# Patient Record
Sex: Female | Born: 1975
Health system: Southern US, Community
[De-identification: ages and names within clinical notes are randomized; demographics above are authoritative.]

## PROBLEM LIST (undated history)

## (undated) DIAGNOSIS — M8580 Other specified disorders of bone density and structure, unspecified site: Secondary | ICD-10-CM

## (undated) DIAGNOSIS — T7840XA Allergy, unspecified, initial encounter: Secondary | ICD-10-CM

## (undated) DIAGNOSIS — O039 Complete or unspecified spontaneous abortion without complication: Secondary | ICD-10-CM

## (undated) DIAGNOSIS — R011 Cardiac murmur, unspecified: Secondary | ICD-10-CM

## (undated) DIAGNOSIS — N390 Urinary tract infection, site not specified: Secondary | ICD-10-CM

## (undated) DIAGNOSIS — C50919 Malignant neoplasm of unspecified site of unspecified female breast: Secondary | ICD-10-CM

## (undated) DIAGNOSIS — G43909 Migraine, unspecified, not intractable, without status migrainosus: Secondary | ICD-10-CM

## (undated) DIAGNOSIS — E785 Hyperlipidemia, unspecified: Secondary | ICD-10-CM

## (undated) DIAGNOSIS — G709 Myoneural disorder, unspecified: Secondary | ICD-10-CM

## (undated) DIAGNOSIS — F419 Anxiety disorder, unspecified: Secondary | ICD-10-CM

## (undated) DIAGNOSIS — M2550 Pain in unspecified joint: Secondary | ICD-10-CM

## (undated) DIAGNOSIS — Z8614 Personal history of Methicillin resistant Staphylococcus aureus infection: Secondary | ICD-10-CM

## (undated) HISTORY — DX: Allergy, unspecified, initial encounter: T78.40XA

## (undated) HISTORY — DX: Hyperlipidemia, unspecified: E78.5

## (undated) HISTORY — DX: Migraine, unspecified, not intractable, without status migrainosus: G43.909

## (undated) HISTORY — PX: DILATION AND CURETTAGE OF UTERUS: SHX78

## (undated) HISTORY — PX: COLONOSCOPY: SHX174

## (undated) HISTORY — DX: Complete or unspecified spontaneous abortion without complication: O03.9

## (undated) HISTORY — DX: Cardiac murmur, unspecified: R01.1

## (undated) HISTORY — PX: WISDOM TOOTH EXTRACTION: SHX21

## (undated) HISTORY — DX: Pain in unspecified joint: M25.50

## (undated) HISTORY — DX: Urinary tract infection, site not specified: N39.0

## (undated) HISTORY — DX: Myoneural disorder, unspecified: G70.9

## (undated) HISTORY — PX: VULVAR LESION REMOVAL: SHX5391

## (undated) HISTORY — DX: Other specified disorders of bone density and structure, unspecified site: M85.80

---
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
## 1998-10-29 ENCOUNTER — Other Ambulatory Visit: Admission: RE | Admit: 1998-10-29 | Discharge: 1998-10-29 | Payer: Self-pay | Admitting: Family Medicine

## 2001-06-21 ENCOUNTER — Other Ambulatory Visit: Admission: RE | Admit: 2001-06-21 | Discharge: 2001-06-21 | Payer: Self-pay | Admitting: Family Medicine

## 2009-04-15 ENCOUNTER — Inpatient Hospital Stay (HOSPITAL_COMMUNITY): Admission: AD | Admit: 2009-04-15 | Discharge: 2009-04-15 | Payer: Self-pay | Admitting: Obstetrics and Gynecology

## 2009-07-06 ENCOUNTER — Inpatient Hospital Stay (HOSPITAL_COMMUNITY): Admission: AD | Admit: 2009-07-06 | Discharge: 2009-07-06 | Payer: Self-pay | Admitting: Obstetrics and Gynecology

## 2009-07-18 ENCOUNTER — Inpatient Hospital Stay (HOSPITAL_COMMUNITY): Admission: AD | Admit: 2009-07-18 | Discharge: 2009-07-21 | Payer: Self-pay | Admitting: Obstetrics and Gynecology

## 2009-12-15 ENCOUNTER — Encounter: Payer: Self-pay | Admitting: Internal Medicine

## 2010-01-16 ENCOUNTER — Ambulatory Visit: Payer: Self-pay | Admitting: Internal Medicine

## 2010-01-16 DIAGNOSIS — E7849 Other hyperlipidemia: Secondary | ICD-10-CM

## 2010-10-28 ENCOUNTER — Encounter
Admission: RE | Admit: 2010-10-28 | Discharge: 2010-10-28 | Payer: Self-pay | Source: Home / Self Care | Attending: Family Medicine | Admitting: Family Medicine

## 2010-11-26 NOTE — Assessment & Plan Note (Signed)
Summary: np6/jss   Visit Type:  Follow-up Primary Provider:  DR Rudi Heap  CC:  high cholestrol.  History of Present Illness: Patient is a 35 year old with no cardiac history.  She is self referred for evaluation of an abnormall [facility] heart lab panel.  Per the patient she has had mild elevation in lipids in the past.   both parents had incrased cholesterol; sister's lipids are good. On talking to her about her diet she  is watching what she eats somewhat. trying to stay low fat.  But, diffiult at times with busy schedule and young kids.  She exercises 3x per week at moderate activity. No chest pains.  Breathing is OK.  Current Medications (verified): 1)  Ortho Tri .... Birth Control 2)  Meds For Acne/ .... Tropical Creams  Allergies (verified): 1)  ! Sulfa 2)  ! Pcn  Past History:  Past Medical History: Dyslipidemia Rosacea  Past Surgical History: Pectus exacavatum repari 1996 C section x 2  Family History: Mother and father with dyslipidemia [place] side of family with premature CAD  Social History: Married.  2 kids No tobacco  Rare EtOH.  Review of Systems       All systems reviewed.  Negative to the above problem except as noted above.  Vital Signs:  Patient profile:   35 year old female Height:      60 inches Weight:      115 pounds BMI:     22.54 Pulse rate:   70 / minute BP sitting:   106 / 67  (left arm) Cuff size:   regular  Vitals Entered By: Burnett Kanaris, CNA (January 16, 2010 2:01 PM)  Physical Exam  Additional Exam:  Patient is in NAD. HEENT:  Normocephalic, atraumatic. EOMI, PERRLA.  Neck: JVP is normal. No thyromegaly. No bruits.  Lungs: clear to auscultation. No rales no wheezes.  Heart: Regular rate and rhythm. Normal S1, S2. No S3.   No significant murmurs. PMI not displaced.  Abdomen:  Supple, nontender. Normal bowel sounds. No masses. No hepatomegaly.  Extremities:   Good distal pulses throughout. No lower extremity edema.    Musculoskeletal :moving all extremities.  Neuro:   alert and oriented x3.    EKG  Procedure date:  01/16/2010  Findings:      NSR.  70 bpm.  Impression & Recommendations:  Problem # 1:  HYPERLIPIDEMIA-MIXED (ICD-272.4) Review of the patient's recent [facility] panel show that her lipids aren't optimal.  LDL is increased with smaller particls.  HDL hs saller particles.  Patients genotype is noted to not be optimal.  CRP is also mildly elevated. I have revewed this with Corrie Dandy.  I have tried to reassure her.  As a 35 year old woman she is at very low risk for developing vascular dz at present.  I think she has ample time to make changes an follow. We discussed diet.  She has room to make changes.  I would recomm fish oil as tolerated. We also discussed increasing aerobic activities. I do not think that she needs to worry about starting any medical Rx now.  I would recomm f/u with lipomed later this year after she has made some changes. I think it would be interesting to get a [facility] panel for both parents.

## 2011-01-29 LAB — CBC
Hemoglobin: 11.3 g/dL — ABNORMAL LOW (ref 12.0–15.0)
MCHC: 34.3 g/dL (ref 30.0–36.0)
MCHC: 34.4 g/dL (ref 30.0–36.0)
MCV: 98.9 fL (ref 78.0–100.0)
Platelets: 137 10*3/uL — ABNORMAL LOW (ref 150–400)
Platelets: 195 10*3/uL (ref 150–400)
RBC: 3.32 MIL/uL — ABNORMAL LOW (ref 3.87–5.11)
RDW: 13.4 % (ref 11.5–15.5)
WBC: 10.1 10*3/uL (ref 4.0–10.5)

## 2011-01-29 LAB — RPR: RPR Ser Ql: NONREACTIVE

## 2011-07-08 LAB — RPR: RPR: NONREACTIVE

## 2011-07-08 LAB — HEPATITIS B SURFACE ANTIGEN: Hepatitis B Surface Ag: NEGATIVE

## 2011-07-08 LAB — ABO/RH

## 2011-08-26 ENCOUNTER — Ambulatory Visit (INDEPENDENT_AMBULATORY_CARE_PROVIDER_SITE_OTHER): Payer: 59 | Admitting: Internal Medicine

## 2011-08-26 ENCOUNTER — Encounter: Payer: Self-pay | Admitting: Internal Medicine

## 2011-08-26 VITALS — BP 107/71 | HR 98 | Temp 98.3°F | Ht 60.0 in | Wt 118.0 lb

## 2011-08-26 DIAGNOSIS — R300 Dysuria: Secondary | ICD-10-CM

## 2011-08-26 DIAGNOSIS — L732 Hidradenitis suppurativa: Secondary | ICD-10-CM | POA: Insufficient documentation

## 2011-08-26 LAB — URINALYSIS, ROUTINE W REFLEX MICROSCOPIC
Bilirubin Urine: NEGATIVE
Glucose, UA: NEGATIVE mg/dL
Protein, ur: NEGATIVE mg/dL

## 2011-08-26 MED ORDER — CLINDAMYCIN PHOSPHATE 1 % EX LOTN: TOPICAL_LOTION | Freq: Two times a day (BID) | CUTANEOUS | Status: DC

## 2011-08-26 NOTE — Assessment & Plan Note (Signed)
That is the first episode I do suspect that she has hidradenitis suppurativa. I doubt this is MRSA skin or abscess infection for her the reason that it is bilateral in both in both axillary regions. It is not warm or significantly erythematous and is not a confluent fluid collection. I also do not think this is folliculitis because the follicles are not specifically inflamed or there is no other lymphadenopathy noted. She has no systemic symptoms of infection. Therefore both by the presentation and the fact that is bilateral I do suspect this is as stated, the hidradenitis. At this time, I have given her a prescription for a topical therapy and has anti-inflammatory effects and have referred her to a dermatologist who can further elucidate if there is any other diagnosis or ways to prevent future events. I am avoiding systemic therapy at this time to do her pregnancy and her allergies. Additionally she has had some malaise and other nonspecific symptoms and it was suggested to her that she has a urinary tract infection and therefore I will check a UA and sent off for culture if it is positive and consider therapy based on that. I have advised her to stop taking her erythromycin pending further evaluation with urinalysis.

## 2011-08-26 NOTE — Progress Notes (Signed)
  Subjective:    Patient ID: Katelyn Shelton, female    DOB: Jul 01, 1976, 35 y.o.   MRN: [id number]  HPI The patient comes in as a new patient for bilateral axillary lesions. She tells me that she noted some increased swelling of her axillary regions on both sides approximately one week ago. They have increased in size and have been more and more painful. They are not associated with fever or chills. She has had some general malaise as well recently which her oxygenation attributed to a urinary tract infection. She is currently on erythromycin but has not felt any improvement. She's had no history of this in her axillary region or in her groin or in any other areas. She's had no history of seeing significant rashes. No history of anything related to this. They are significantly painful particularly with palpation.   Review of Systems  Constitutional: Negative for fever, chills, activity change and fatigue.  Gastrointestinal:       Gravid  Musculoskeletal: Negative for myalgias and arthralgias.  Skin: Positive for rash.  Neurological: Positive for headaches.  Hematological: Negative for adenopathy.       Objective:   Physical Exam  Constitutional: She appears well-developed and well-nourished.  Skin:       + multiple soft, fluctuant and mobile areas.  Both axillary regions.    Psychiatric: She has a normal mood and affect. Her behavior is normal.          Assessment & Plan:

## 2011-08-26 NOTE — Patient Instructions (Signed)
Follow up with dermatology for ? Hidradenitis supperativa.

## 2011-08-27 LAB — URINALYSIS, MICROSCOPIC ONLY
Bacteria, UA: NONE SEEN
Casts: NONE SEEN
Crystals: NONE SEEN

## 2011-08-28 LAB — URINE CULTURE: Colony Count: 3000

## 2011-08-30 NOTE — Progress Notes (Signed)
Patient notified

## 2012-01-21 ENCOUNTER — Encounter (HOSPITAL_COMMUNITY): Payer: Self-pay

## 2012-02-01 ENCOUNTER — Encounter (HOSPITAL_COMMUNITY): Payer: Self-pay

## 2012-02-02 ENCOUNTER — Encounter (HOSPITAL_COMMUNITY)
Admission: RE | Admit: 2012-02-02 | Discharge: 2012-02-02 | Disposition: A | Discharge status: Home / Self Care | Payer: 59 | Source: Ambulatory Visit | Attending: Obstetrics and Gynecology | Admitting: Obstetrics and Gynecology

## 2012-02-02 ENCOUNTER — Encounter (HOSPITAL_COMMUNITY): Payer: Self-pay

## 2012-02-02 LAB — CBC
MCH: 32.4 pg (ref 26.0–34.0)
MCHC: 33.6 g/dL (ref 30.0–36.0)
MCV: 96.6 fL (ref 78.0–100.0)
Platelets: 187 10*3/uL (ref 150–400)
RDW: 13.9 % (ref 11.5–15.5)
WBC: 8.5 10*3/uL (ref 4.0–10.5)

## 2012-02-02 LAB — SURGICAL PCR SCREEN: MRSA, PCR: NEGATIVE

## 2012-02-02 NOTE — Patient Instructions (Addendum)
20 Katelyn Shelton  02/02/2012   Your procedure is scheduled on:  02/08/12  Enter through the Main Entrance of [facility] at 1130 AM.  Pick up the phone at the desk and dial [phone].   Call this number if you have problems the morning of surgery: [phone]   Remember:   Do not eat food:after midnight the evening before  Do not drink clear liquids: 4 Hours before arrival.  Take these medicines the morning of surgery with A SIP OF WATER: NA   Do not wear jewelry, make-up or nail polish.  Do not wear lotions, powders, or perfumes. You may wear deodorant.  Do not shave 48 hours prior to surgery.  Do not bring valuables to the hospital.  Contacts, dentures or bridgework may not be worn into surgery.  Leave suitcase in the car. After surgery it may be brought to your room.  For patients admitted to the hospital, checkout time is 11:00 AM the day of discharge.   Patients discharged the day of surgery will not be allowed to drive home.  Name and phone number of your driver: NA  Special Instructions: CHG Shower Use Special Wash: 1/2 bottle night before surgery and 1/2 bottle morning of surgery.   Please read over the following fact sheets that you were given: MRSA Information

## 2012-02-04 ENCOUNTER — Encounter (HOSPITAL_COMMUNITY): Payer: Self-pay | Admitting: *Deleted

## 2012-02-04 ENCOUNTER — Encounter (HOSPITAL_COMMUNITY)
Admission: AD | Disposition: A | Discharge status: Home / Self Care | Payer: Self-pay | Source: Ambulatory Visit | Attending: Obstetrics and Gynecology

## 2012-02-04 ENCOUNTER — Inpatient Hospital Stay (HOSPITAL_COMMUNITY): Payer: 59 | Admitting: Anesthesiology

## 2012-02-04 ENCOUNTER — Encounter (HOSPITAL_COMMUNITY): Payer: Self-pay | Admitting: Anesthesiology

## 2012-02-04 ENCOUNTER — Inpatient Hospital Stay (HOSPITAL_COMMUNITY)
Admission: AD | Admit: 2012-02-04 | Discharge: 2012-02-07 | DRG: 766 | Disposition: A | Discharge status: Home / Self Care | Payer: 59 | Source: Ambulatory Visit | Attending: Obstetrics and Gynecology | Admitting: Obstetrics and Gynecology

## 2012-02-04 DIAGNOSIS — Z01812 Encounter for preprocedural laboratory examination: Secondary | ICD-10-CM

## 2012-02-04 DIAGNOSIS — L732 Hidradenitis suppurativa: Secondary | ICD-10-CM

## 2012-02-04 DIAGNOSIS — Z01818 Encounter for other preprocedural examination: Secondary | ICD-10-CM

## 2012-02-04 DIAGNOSIS — E785 Hyperlipidemia, unspecified: Secondary | ICD-10-CM

## 2012-02-04 DIAGNOSIS — O34219 Maternal care for unspecified type scar from previous cesarean delivery: Principal | ICD-10-CM | POA: Diagnosis present

## 2012-02-04 LAB — CBC
Hemoglobin: 12.9 g/dL (ref 12.0–15.0)
MCHC: 33.6 g/dL (ref 30.0–36.0)
Platelets: 196 10*3/uL (ref 150–400)
RDW: 13.8 % (ref 11.5–15.5)

## 2012-02-04 LAB — RPR: RPR Ser Ql: NONREACTIVE

## 2012-02-04 SURGERY — Surgical Case
Anesthesia: Epidural | Site: Abdomen | Wound class: Clean Contaminated

## 2012-02-04 MED ORDER — HYDROMORPHONE HCL PF 1 MG/ML IJ SOLN
INTRAMUSCULAR | Status: AC
  Filled 2012-02-04: qty 1

## 2012-02-04 MED ORDER — KETOROLAC TROMETHAMINE 30 MG/ML IJ SOLN
30.0000 mg | Freq: Four times a day (QID) | INTRAMUSCULAR | Status: AC | PRN
  Administered 2012-02-04 (×3): 30 mg via INTRAVENOUS
  Filled 2012-02-04 (×3): qty 1

## 2012-02-04 MED ORDER — BUPIVACAINE HCL (PF) 0.25 % IJ SOLN
INTRAMUSCULAR | Status: AC
  Filled 2012-02-04: qty 30

## 2012-02-04 MED ORDER — MEPERIDINE HCL 25 MG/ML IJ SOLN: 6.2500 mg | INTRAMUSCULAR | Status: DC | PRN

## 2012-02-04 MED ORDER — SCOPOLAMINE 1 MG/3DAYS TD PT72
1.0000 | MEDICATED_PATCH | Freq: Once | TRANSDERMAL | Status: AC
  Administered 2012-02-04: 1.5 mg via TRANSDERMAL

## 2012-02-04 MED ORDER — SODIUM CHLORIDE 0.9 % IJ SOLN: 3.0000 mL | INTRAMUSCULAR | Status: DC | PRN

## 2012-02-04 MED ORDER — KETOROLAC TROMETHAMINE 30 MG/ML IJ SOLN: 30.0000 mg | Freq: Four times a day (QID) | INTRAMUSCULAR | Status: AC | PRN

## 2012-02-04 MED ORDER — MENTHOL 3 MG MT LOZG: 1.0000 | LOZENGE | OROMUCOSAL | Status: DC | PRN

## 2012-02-04 MED ORDER — OXYTOCIN 20 UNITS IN LACTATED RINGERS INFUSION - SIMPLE: 125.0000 mL/h | INTRAVENOUS | Status: AC

## 2012-02-04 MED ORDER — BISACODYL 10 MG RE SUPP: 10.0000 mg | Freq: Every day | RECTAL | Status: DC | PRN

## 2012-02-04 MED ORDER — NALBUPHINE SYRINGE 5 MG/0.5 ML
INJECTION | INTRAMUSCULAR | Status: AC
  Administered 2012-02-04: 10 mg via INTRAVENOUS
  Filled 2012-02-04: qty 0.5

## 2012-02-04 MED ORDER — LACTATED RINGERS IV SOLN
INTRAVENOUS | Status: DC
  Administered 2012-02-04 (×2): via INTRAVENOUS

## 2012-02-04 MED ORDER — NALOXONE HCL 0.4 MG/ML IJ SOLN: 1.0000 ug/kg/h | INTRAMUSCULAR | Status: DC | PRN

## 2012-02-04 MED ORDER — BUPIVACAINE HCL (PF) 0.25 % IJ SOLN
INTRAMUSCULAR | Status: DC | PRN
  Administered 2012-02-04: 10 mL

## 2012-02-04 MED ORDER — GENTAMICIN SULFATE 40 MG/ML IJ SOLN
INTRAVENOUS | Status: AC
  Administered 2012-02-04: 100 mL via INTRAVENOUS
  Filled 2012-02-04: qty 2.5

## 2012-02-04 MED ORDER — MEDROXYPROGESTERONE ACETATE 150 MG/ML IM SUSP: 150.0000 mg | INTRAMUSCULAR | Status: DC | PRN

## 2012-02-04 MED ORDER — ONDANSETRON HCL 4 MG/2ML IJ SOLN
INTRAMUSCULAR | Status: AC
  Filled 2012-02-04: qty 2

## 2012-02-04 MED ORDER — TETANUS-DIPHTH-ACELL PERTUSSIS 5-2.5-18.5 LF-MCG/0.5 IM SUSP: 0.5000 mL | Freq: Once | INTRAMUSCULAR | Status: DC

## 2012-02-04 MED ORDER — SENNOSIDES-DOCUSATE SODIUM 8.6-50 MG PO TABS
2.0000 | ORAL_TABLET | Freq: Every day | ORAL | Status: DC
  Administered 2012-02-04 – 2012-02-06 (×3): 2 via ORAL

## 2012-02-04 MED ORDER — EPHEDRINE SULFATE 50 MG/ML IJ SOLN
INTRAMUSCULAR | Status: DC | PRN
  Administered 2012-02-04 (×2): 10 mg via INTRAVENOUS

## 2012-02-04 MED ORDER — DIBUCAINE 1 % RE OINT: 1.0000 "application " | TOPICAL_OINTMENT | RECTAL | Status: DC | PRN

## 2012-02-04 MED ORDER — NALBUPHINE SYRINGE 5 MG/0.5 ML
5.0000 mg | INJECTION | INTRAMUSCULAR | Status: DC | PRN
Start: 2012-02-04 — End: 2012-02-07
  Filled 2012-02-04: qty 1

## 2012-02-04 MED ORDER — DIPHENHYDRAMINE HCL 50 MG/ML IJ SOLN: 25.0000 mg | INTRAMUSCULAR | Status: DC | PRN

## 2012-02-04 MED ORDER — ONDANSETRON HCL 4 MG/2ML IJ SOLN: 4.0000 mg | INTRAMUSCULAR | Status: DC | PRN

## 2012-02-04 MED ORDER — DIPHENHYDRAMINE HCL 50 MG/ML IJ SOLN
12.5000 mg | INTRAMUSCULAR | Status: DC | PRN
  Administered 2012-02-04: 12.5 mg via INTRAVENOUS

## 2012-02-04 MED ORDER — BUPIVACAINE IN DEXTROSE 0.75-8.25 % IT SOLN
INTRATHECAL | Status: DC | PRN
  Administered 2012-02-04: 11 mg via INTRATHECAL

## 2012-02-04 MED ORDER — LANOLIN HYDROUS EX OINT: 1.0000 "application " | TOPICAL_OINTMENT | CUTANEOUS | Status: DC | PRN

## 2012-02-04 MED ORDER — HYDROMORPHONE HCL PF 1 MG/ML IJ SOLN
INTRAMUSCULAR | Status: DC | PRN
  Administered 2012-02-04: 1 mg via INTRAVENOUS

## 2012-02-04 MED ORDER — SIMETHICONE 80 MG PO CHEW: 80.0000 mg | CHEWABLE_TABLET | ORAL | Status: DC | PRN

## 2012-02-04 MED ORDER — MORPHINE SULFATE 0.5 MG/ML IJ SOLN
INTRAMUSCULAR | Status: AC
  Filled 2012-02-04: qty 10

## 2012-02-04 MED ORDER — PRENATAL MULTIVITAMIN CH
1.0000 | ORAL_TABLET | Freq: Every day | ORAL | Status: DC
  Administered 2012-02-05 – 2012-02-06 (×2): 1 via ORAL
  Filled 2012-02-04 (×5): qty 1

## 2012-02-04 MED ORDER — MEASLES, MUMPS & RUBELLA VAC ~~LOC~~ INJ: 0.5000 mL | INJECTION | Freq: Once | SUBCUTANEOUS | Status: DC

## 2012-02-04 MED ORDER — SIMETHICONE 80 MG PO CHEW
80.0000 mg | CHEWABLE_TABLET | Freq: Three times a day (TID) | ORAL | Status: DC
  Administered 2012-02-04 – 2012-02-07 (×8): 80 mg via ORAL

## 2012-02-04 MED ORDER — ONDANSETRON HCL 4 MG/2ML IJ SOLN: 4.0000 mg | Freq: Three times a day (TID) | INTRAMUSCULAR | Status: DC | PRN

## 2012-02-04 MED ORDER — MORPHINE SULFATE (PF) 0.5 MG/ML IJ SOLN
INTRAMUSCULAR | Status: DC | PRN
  Administered 2012-02-04: .15 mg via EPIDURAL

## 2012-02-04 MED ORDER — FENTANYL CITRATE 0.05 MG/ML IJ SOLN
INTRAMUSCULAR | Status: AC
  Filled 2012-02-04: qty 2

## 2012-02-04 MED ORDER — HYDROMORPHONE HCL PF 1 MG/ML IJ SOLN: 0.2500 mg | INTRAMUSCULAR | Status: DC | PRN

## 2012-02-04 MED ORDER — DIPHENHYDRAMINE HCL 25 MG PO CAPS
25.0000 mg | ORAL_CAPSULE | ORAL | Status: DC | PRN
  Administered 2012-02-04 – 2012-02-05 (×2): 25 mg via ORAL
  Filled 2012-02-04 (×2): qty 1

## 2012-02-04 MED ORDER — NALOXONE HCL 0.4 MG/ML IJ SOLN: 0.4000 mg | INTRAMUSCULAR | Status: DC | PRN

## 2012-02-04 MED ORDER — OXYTOCIN 10 UNIT/ML IJ SOLN
INTRAMUSCULAR | Status: AC
  Filled 2012-02-04: qty 4

## 2012-02-04 MED ORDER — ONDANSETRON HCL 4 MG/2ML IJ SOLN
INTRAMUSCULAR | Status: DC | PRN
  Administered 2012-02-04: 4 mg via INTRAVENOUS

## 2012-02-04 MED ORDER — PHENYLEPHRINE 40 MCG/ML (10ML) SYRINGE FOR IV PUSH (FOR BLOOD PRESSURE SUPPORT)
PREFILLED_SYRINGE | INTRAVENOUS | Status: AC
  Filled 2012-02-04: qty 10

## 2012-02-04 MED ORDER — LACTATED RINGERS IV SOLN
INTRAVENOUS | Status: DC
  Administered 2012-02-04: 04:00:00 via INTRAVENOUS

## 2012-02-04 MED ORDER — DIPHENHYDRAMINE HCL 25 MG PO CAPS
25.0000 mg | ORAL_CAPSULE | Freq: Four times a day (QID) | ORAL | Status: DC | PRN
  Filled 2012-02-04: qty 1

## 2012-02-04 MED ORDER — FLEET ENEMA 7-19 GM/118ML RE ENEM: 1.0000 | ENEMA | Freq: Every day | RECTAL | Status: DC | PRN

## 2012-02-04 MED ORDER — OXYTOCIN 20 UNITS IN LACTATED RINGERS INFUSION - SIMPLE
INTRAVENOUS | Status: DC | PRN
  Administered 2012-02-04 (×2): 20 [IU] via INTRAVENOUS

## 2012-02-04 MED ORDER — WITCH HAZEL-GLYCERIN EX PADS: 1.0000 "application " | MEDICATED_PAD | CUTANEOUS | Status: DC | PRN

## 2012-02-04 MED ORDER — ZOLPIDEM TARTRATE 5 MG PO TABS: 5.0000 mg | ORAL_TABLET | Freq: Every evening | ORAL | Status: DC | PRN

## 2012-02-04 MED ORDER — METOCLOPRAMIDE HCL 5 MG/ML IJ SOLN: 10.0000 mg | Freq: Three times a day (TID) | INTRAMUSCULAR | Status: DC | PRN

## 2012-02-04 MED ORDER — 0.9 % SODIUM CHLORIDE (POUR BTL) OPTIME
TOPICAL | Status: DC | PRN
  Administered 2012-02-04: 1000 mL

## 2012-02-04 MED ORDER — NALBUPHINE SYRINGE 5 MG/0.5 ML
5.0000 mg | INJECTION | INTRAMUSCULAR | Status: DC | PRN
Start: 2012-02-04 — End: 2012-02-07
  Administered 2012-02-04 (×2): 10 mg via INTRAVENOUS
  Administered 2012-02-04: 5 mg via INTRAVENOUS
  Filled 2012-02-04 (×3): qty 1

## 2012-02-04 MED ORDER — CITRIC ACID-SODIUM CITRATE 334-500 MG/5ML PO SOLN
ORAL | Status: AC
  Administered 2012-02-04: 30 mL
  Filled 2012-02-04: qty 15

## 2012-02-04 MED ORDER — EPHEDRINE 5 MG/ML INJ
INTRAVENOUS | Status: AC
  Filled 2012-02-04: qty 20

## 2012-02-04 MED ORDER — ONDANSETRON HCL 4 MG PO TABS: 4.0000 mg | ORAL_TABLET | ORAL | Status: DC | PRN

## 2012-02-04 MED ORDER — PHENYLEPHRINE HCL 10 MG/ML IJ SOLN
INTRAMUSCULAR | Status: DC | PRN
  Administered 2012-02-04 (×3): 40 ug via INTRAVENOUS

## 2012-02-04 MED ORDER — DIPHENHYDRAMINE HCL 50 MG/ML IJ SOLN
INTRAMUSCULAR | Status: AC
  Filled 2012-02-04: qty 1

## 2012-02-04 MED ORDER — OXYCODONE-ACETAMINOPHEN 5-325 MG PO TABS
1.0000 | ORAL_TABLET | ORAL | Status: DC | PRN
  Administered 2012-02-04 – 2012-02-05 (×8): 1 via ORAL
  Administered 2012-02-06 – 2012-02-07 (×5): 2 via ORAL
  Filled 2012-02-04: qty 1
  Filled 2012-02-04: qty 2
  Filled 2012-02-04: qty 1
  Filled 2012-02-04 (×2): qty 2
  Filled 2012-02-04: qty 1
  Filled 2012-02-04: qty 2
  Filled 2012-02-04 (×3): qty 1
  Filled 2012-02-04: qty 2
  Filled 2012-02-04: qty 1
  Filled 2012-02-04: qty 2
  Filled 2012-02-04: qty 1

## 2012-02-04 MED ORDER — IBUPROFEN 600 MG PO TABS
600.0000 mg | ORAL_TABLET | Freq: Four times a day (QID) | ORAL | Status: DC
  Administered 2012-02-05 – 2012-02-07 (×11): 600 mg via ORAL
  Filled 2012-02-04 (×12): qty 1

## 2012-02-04 MED ORDER — LACTATED RINGERS IV SOLN
INTRAVENOUS | Status: DC | PRN
  Administered 2012-02-04: 05:00:00 via INTRAVENOUS

## 2012-02-04 MED ORDER — SCOPOLAMINE 1 MG/3DAYS TD PT72
MEDICATED_PATCH | TRANSDERMAL | Status: AC
  Filled 2012-02-04: qty 1

## 2012-02-04 SURGICAL SUPPLY — 34 items
BARRIER ADHS 3X4 INTERCEED (GAUZE/BANDAGES/DRESSINGS) IMPLANT
CHLORAPREP W/TINT 26ML (MISCELLANEOUS) ×2 IMPLANT
CLOTH BEACON ORANGE TIMEOUT ST (SAFETY) ×2 IMPLANT
CONTAINER PREFILL 10% NBF 15ML (MISCELLANEOUS) IMPLANT
DRSG COVADERM 4X10 (GAUZE/BANDAGES/DRESSINGS) ×2 IMPLANT
ELECT REM PT RETURN 9FT ADLT (ELECTROSURGICAL) ×2
ELECTRODE REM PT RTRN 9FT ADLT (ELECTROSURGICAL) ×1 IMPLANT
EXTRACTOR VACUUM M CUP 4 TUBE (SUCTIONS) IMPLANT
GLOVE BIO SURGEON STRL SZ 6.5 (GLOVE) ×2 IMPLANT
GLOVE SKINSENSE NS SZ6.5 (GLOVE) ×2
GLOVE SKINSENSE NS SZ7.5 (GLOVE) ×2
GLOVE SKINSENSE NS SZ8.0 LF (GLOVE) ×2
GLOVE SKINSENSE STRL SZ6.5 (GLOVE) ×2 IMPLANT
GLOVE SKINSENSE STRL SZ7.5 (GLOVE) ×2 IMPLANT
GLOVE SKINSENSE STRL SZ8.0 LF (GLOVE) ×2 IMPLANT
GOWN PREVENTION PLUS LG XLONG (DISPOSABLE) ×6 IMPLANT
KIT ABG SYR 3ML LUER SLIP (SYRINGE) IMPLANT
NEEDLE HYPO 22GX1.5 SAFETY (NEEDLE) ×2 IMPLANT
NEEDLE HYPO 25X5/8 SAFETYGLIDE (NEEDLE) ×2 IMPLANT
NS IRRIG 1000ML POUR BTL (IV SOLUTION) IMPLANT
PACK C SECTION WH (CUSTOM PROCEDURE TRAY) ×2 IMPLANT
PAD ABD 7.5X8 STRL (GAUZE/BANDAGES/DRESSINGS) ×2 IMPLANT
SLEEVE SCD COMPRESS KNEE MED (MISCELLANEOUS) ×2 IMPLANT
STAPLER VISISTAT 35W (STAPLE) ×2 IMPLANT
SUT CHROMIC 0 CTX 36 (SUTURE) ×4 IMPLANT
SUT PLAIN 0 NONE (SUTURE) IMPLANT
SUT PLAIN 2 0 XLH (SUTURE) IMPLANT
SUT VIC AB 0 CT1 27 (SUTURE) ×3
SUT VIC AB 0 CT1 27XBRD ANBCTR (SUTURE) ×3 IMPLANT
SUT VIC AB 4-0 KS 27 (SUTURE) ×2 IMPLANT
SYR CONTROL 10ML LL (SYRINGE) ×2 IMPLANT
TOWEL OR 17X24 6PK STRL BLUE (TOWEL DISPOSABLE) ×4 IMPLANT
TRAY FOLEY CATH 14FR (SET/KITS/TRAYS/PACK) IMPLANT
WATER STERILE IRR 1000ML POUR (IV SOLUTION) ×2 IMPLANT

## 2012-02-04 NOTE — Consult Note (Signed)
Called to attend repeat C/S at term gestation after onset of labor; originally scheduled for next week. No other risk factors reported.  At delivery infant in vertex and was assisted with vacuum to expose the head and then manually extracted. Spontaneous cries and good tone noted. Given tactile stimulation with drying and bulb suction to naso/oropharynx yielding minimal fluid. No dysmorphic features. Apgar scores 9/10 at one and five minutes. Infant has voided.   Care transferred to RN from [facility] and to assigned pediatrician, Dr. Carlean Purl.  Dagoberto Ligas MD [facility]

## 2012-02-04 NOTE — Op Note (Signed)
NAMEJANELLE, Katelyn Shelton                 ACCOUNT NO.:  [id number]  MEDICAL RECORD NO.:  [id number]  LOCATION:  [facility]                          FACILITY:  [facility]  PHYSICIAN:  Smith Potenza L. Camron Essman, M.D.DATE OF BIRTH:  Feb 04, 1976  DATE OF PROCEDURE:  02/04/2012 DATE OF DISCHARGE:                              OPERATIVE REPORT   PREOPERATIVE DIAGNOSES: 1. Intrauterine pregnancy at 38-1/2. 2. Spontaneous rupture of membranes and labor 3. Previous cesarean section x2.  POSTOPERATIVE DIAGNOSES: 1. Intrauterine pregnancy at 38-1/2. 2. Spontaneous rupture of membranes and labor 3. Previous cesarean section x2.  PROCEDURE:  Repeat low transverse cesarean section.  SURGEON:  Shereda Graw L. Lera Gaines, MD  ANESTHESIA:  Spinal.  EBL:  500 mL.  COMPLICATIONS:  None.  DRAINS:  Foley catheter to gravity.  COMPLICATIONS:  None.  PROCEDURE:  The patient was taken to the operating room.  Her spinal was placed.  She was prepped and draped.  A Foley catheter was inserted. Allis test was performed.  A low transverse incision was made with scalpel, carried down to the fascia.  The fascia was scored in the midline and extended laterally.  Rectus muscles were separated in the midline.  The peritoneum was entered bluntly.  The peritoneal incision was then stretched.  The lower uterine segment was identified.  The bladder flap was identified and the bladder flap was developed sharply and then digitally.  The bladder blade was then readjusted.  A low transverse incision was made in the uterus.  Uterus was entered using a hemostat.  The baby was delivered in cephalic presentation with the assistance of a vacuum extractor without any difficulty.  The baby was a female infant where Apgars 9 at 1 minute and 9 at 5 minutes.  There was a tight nuchal cord x1.  The cord was clamped and cut and the baby was handed to the awaiting neonatal team.  The placenta was manually removed, noted to be normal intact with a 3-vessel  cord.  Cord blood had been obtained.  Antibiotics and Pitocin were given.  Uterus was exteriorized.  It was cleared of all clots and debris.  The uterine incision was closed in 1 layer using 0 chromic in a running, locked stitch.  The uterus was returned to the abdomen.  Irrigation performed. Hemostasis was excellent.  The peritoneum was closed using 0 Vicryl. The rectus muscles were closed using 0 Vicryl in a running stitch.  The fascia was closed using 0 Vicryl in a running stitch starting each corner, meeting in the midline.  After irrigation of subcutaneous layer, the skin was closed with staples.  A pressure dressing was applied.  All sponge, lap, and instrument counts were correct x2.  The patient went to recovery room in stable condition.     Jackqulyn Mendel L. Vincente Poli, M.D.     Florestine Avers  D:  02/04/2012  T:  02/04/2012  Job:  [id number]

## 2012-02-04 NOTE — Progress Notes (Signed)
Subjective: Postpartum Day 0: Cesarean Delivery Patient reports tolerating PO.    Objective: Vital signs in last 24 hours: Temp:  [97.3 F (36.3 C)-98.6 F (37 C)] 97.3 F (36.3 C) (04/12 0735) Pulse Rate:  [93-104] 93  (04/12 0735) Resp:  [9-20] 16  (04/12 0735) BP: (96-132)/(54-93) 110/69 mmHg (04/12 0735) SpO2:  [96 %-99 %] 96 % (04/12 0735) Weight:  [66.339 kg (146 lb 4 oz)] 66.339 kg (146 lb 4 oz) (04/12 0325)  Physical Exam:  General: alert and cooperative Lochia: appropriate Uterine Fundus: firm Incision: abd dressing CDI DVT Evaluation: No evidence of DVT seen on physical exam. Foley with adequate output  Basename 02/04/12 0401 02/02/12 1053  HGB 12.9 13.4  HCT 38.4 39.9    Assessment/Plan: Status post Cesarean section. Doing well postoperatively.  Continue current care.  CURTIS,CAROL G 02/04/2012, 8:04 AM

## 2012-02-04 NOTE — H&P (Signed)
36 year old G4 P0012 at 72 w 3 days. Presents with SROM clear fluid and is scheduled for Repeat LTCS. She has had 2 previous Cesarean Sections.  PNC see [facility] Fentanyl Penicillin Sulfa  Afebrile VSS General alert and oriented Lung CTAB Car RRR Abdomen is soft and non tender SROM confirmed by nurse Last exam in office 3 cm dilated  IMPRESSION: IUP at 38 w 3 days Previous Cesarean Section x 2  PLAN: Repeat LTCS Risks reviewed with the patient

## 2012-02-04 NOTE — Transfer of Care (Signed)
Immediate Anesthesia Transfer of Care Note  Patient: Katelyn Shelton  Procedure(s) Performed: Procedure(s) (LRB): CESAREAN SECTION (N/A)  Patient Location: PACU  Anesthesia Type: Spinal  Level of Consciousness: awake, alert  and oriented  Airway & Oxygen Therapy: Patient Spontanous Breathing  Post-op Assessment: Report given to PACU RN and Post -op Vital signs reviewed and stable  Post vital signs: Reviewed and stable  Complications: No apparent anesthesia complications

## 2012-02-04 NOTE — MAU Note (Signed)
FHR 137

## 2012-02-04 NOTE — Anesthesia Procedure Notes (Signed)
Spinal  Patient location during procedure: OR Start time: 02/04/2012 4:42 AM Staffing Anesthesiologist: Latona Krichbaum A. Performed by: anesthesiologist  Preanesthetic Checklist Completed: patient identified, site marked, surgical consent, pre-op evaluation, timeout performed, IV checked, risks and benefits discussed and monitors and equipment checked Spinal Block Patient position: sitting Prep: site prepped and draped and DuraPrep Patient monitoring: heart rate, cardiac monitor, continuous pulse ox and blood pressure Approach: midline Location: L3-4 Injection technique: single-shot Needle Needle type: Sprotte  Needle gauge: 24 G Needle length: 9 cm Needle insertion depth: 4 cm Assessment Sensory level: T2 Additional Notes Patient tolerated procedure well. Adequate sensory level.

## 2012-02-04 NOTE — MAU Note (Signed)
PT SAYS   SROM AT 0130- CLEAR.  UC HURT   SINCE 0300. VE-  3 IN OFFICE.[name] DENIES HSV AND MRSA

## 2012-02-04 NOTE — Anesthesia Postprocedure Evaluation (Signed)
  Anesthesia Post-op Note  Patient: Katelyn Shelton  Procedure(s) Performed: Procedure(s) (LRB): CESAREAN SECTION (N/A)  Patient Location: PACU  Anesthesia Type: Spinal  Level of Consciousness: awake, alert  and oriented  Airway and Oxygen Therapy: Patient Spontanous Breathing  Post-op Pain: none  Post-op Assessment: Post-op Vital signs reviewed, Patient's Cardiovascular Status Stable, Respiratory Function Stable, Patent Airway, No signs of Nausea or vomiting, Pain level controlled, No headache, No backache, No residual numbness and No residual motor weakness  Post-op Vital Signs: Reviewed and stable  Complications: No apparent anesthesia complications

## 2012-02-04 NOTE — Brief Op Note (Signed)
02/04/2012  5:23 AM  PATIENT:  Katelyn Shelton  36 y.o. female  PRE-OPERATIVE DIAGNOSIS:  Previous Cesarean Section x 2 , Labor  POST-OPERATIVE DIAGNOSIS:  Previous Cesarean Section x 2 , Labor  PROCEDURE:  Procedure(s) (LRB): Repeat Low Transverse CESAREAN SECTION (N/A)  SURGEON:  Surgeon(s) and Role:    * Jeani Hawking, MD - Primary  PHYSICIAN ASSISTANT:   ASSISTANTS: none   ANESTHESIA:   spinal  EBL:     BLOOD ADMINISTERED:none  DRAINS: Urinary Catheter (Foley)   LOCAL MEDICATIONS USED:  XYLOCAINE   SPECIMEN:  No Specimen  DISPOSITION OF SPECIMEN:  N/A  COUNTS:  YES  TOURNIQUET:  * No tourniquets in log *  DICTATION: .Other Dictation: Dictation Number [id number]  PLAN OF CARE: Admit to inpatient   PATIENT DISPOSITION:  PACU - hemodynamically stable.   Delay start of Pharmacological VTE agent (>24hrs) due to surgical blood loss or risk of bleeding: yes

## 2012-02-04 NOTE — Anesthesia Preprocedure Evaluation (Signed)
Anesthesia Evaluation  Patient identified by MRN, date of birth, ID band Patient awake    Reviewed: Allergy & Precautions, H&P , NPO status , Patient's Chart, lab work & pertinent test results  Airway Mallampati: I TM Distance: >3 FB Neck ROM: full    Dental No notable dental hx. (+) Teeth Intact   Pulmonary neg pulmonary ROS,  breath sounds clear to auscultation  Pulmonary exam normal       Cardiovascular negative cardio ROS  Rhythm:regular Rate:Normal     Neuro/Psych Anxiety negative neurological ROS     GI/Hepatic negative GI ROS, Neg liver ROS,   Endo/Other  negative endocrine ROS  Renal/GU negative Renal ROS  negative genitourinary   Musculoskeletal   Abdominal Normal abdominal exam  (+)   Peds  Hematology negative hematology ROS (+)   Anesthesia Other Findings   Reproductive/Obstetrics (+) Pregnancy                           Anesthesia Physical Anesthesia Plan  ASA: II and Emergent  Anesthesia Plan: Epidural   Post-op Pain Management:    Induction:   Airway Management Planned:   Additional Equipment:   Intra-op Plan:   Post-operative Plan:   Informed Consent: I have reviewed the patients History and Physical, chart, labs and discussed the procedure including the risks, benefits and alternatives for the proposed anesthesia with the patient or authorized representative who has indicated his/her understanding and acceptance.     Plan Discussed with: Anesthesiologist, Surgeon and CRNA  Anesthesia Plan Comments:         Anesthesia Quick Evaluation

## 2012-02-04 NOTE — MAU Note (Signed)
ARRIVAL TO OR--

## 2012-02-05 NOTE — Addendum Note (Signed)
Addendum  created 02/05/12 1515 by Earmon Phoenix, CRNA   Modules edited:Notes Section

## 2012-02-05 NOTE — Progress Notes (Signed)
Subjective: Postpartum Day 1: Cesarean Delivery Patient reports tolerating PO, + flatus and no problems voiding.    Objective: Vital signs in last 24 hours: Temp:  [97.2 F (36.2 C)-98.4 F (36.9 C)] 98.3 F (36.8 C) (04/13 0509) Pulse Rate:  [79-104] 82  (04/13 0509) Resp:  [18] 18  (04/13 0509) BP: (90-114)/(58-69) 101/61 mmHg (04/13 0509) SpO2:  [97 %-98 %] 97 % (04/13 0509)  Physical Exam:  General: alert, cooperative and no distress Lochia: appropriate Uterine Fundus: firm Incision: healing well DVT Evaluation: No evidence of DVT seen on physical exam.   Basename 02/04/12 0401 02/02/12 1053  HGB 12.9 13.4  HCT 38.4 39.9    Assessment/Plan: Status post Cesarean section. Doing well postoperatively.  Continue current care.  Ryan Palermo C 02/05/2012, 9:13 AM

## 2012-02-05 NOTE — Anesthesia Postprocedure Evaluation (Signed)
  Anesthesia Post-op Note  Patient: Katelyn Shelton  Procedure(s) Performed: Procedure(s) (LRB): CESAREAN SECTION (N/A)  Patient Location: Mother/Baby  Anesthesia Type: Spinal  Level of Consciousness: awake, alert  and oriented  Airway and Oxygen Therapy: Patient Spontanous Breathing  Post-op Pain: mild  Post-op Assessment: Patient's Cardiovascular Status Stable and Respiratory Function Stable  Post-op Vital Signs: stable  Complications: No apparent anesthesia complications

## 2012-02-06 NOTE — Progress Notes (Signed)
Subjective: Postpartum Day 2: Cesarean Delivery Patient reports tolerating PO, + flatus, + BM and no problems voiding.    Objective: Vital signs in last 24 hours: Temp:  [98.1 F (36.7 C)-98.7 F (37.1 C)] 98.7 F (37.1 C) (04/14 0531) Pulse Rate:  [77-97] 87  (04/14 0531) Resp:  [17-18] 18  (04/14 0531) BP: (99-104)/(61-67) 99/67 mmHg (04/14 0531)  Physical Exam:  General: no problems Lochia: appropriate Uterine Fundus: firm Incision: healing well DVT Evaluation: No evidence of DVT seen on physical exam.   Basename 02/04/12 0401  HGB 12.9  HCT 38.4    Assessment/Plan: Status post Cesarean section. Doing well postoperatively.  Continue current care.  Shantia Sanford C 02/06/2012, 9:26 AM

## 2012-02-07 ENCOUNTER — Encounter (HOSPITAL_COMMUNITY): Payer: Self-pay | Admitting: Obstetrics and Gynecology

## 2012-02-07 MED ORDER — OXYCODONE-ACETAMINOPHEN 5-325 MG PO TABS: 1.0000 | ORAL_TABLET | ORAL | Status: AC | PRN

## 2012-02-07 MED ORDER — IBUPROFEN 600 MG PO TABS: 600.0000 mg | ORAL_TABLET | Freq: Four times a day (QID) | ORAL | Status: AC

## 2012-02-07 NOTE — Discharge Summary (Signed)
Obstetric Discharge Summary Reason for Admission: onset of labor and rupture of membranes Prenatal Procedures: ultrasound Intrapartum Procedures: cesarean: low cervical, transverse Postpartum Procedures: none Complications-Operative and Postpartum: none Hemoglobin  Date Value Range Status  02/04/2012 12.9  12.0-15.0 (g/dL) Final     HCT  Date Value Range Status  02/04/2012 38.4  36.0-46.0 (%) Final    Physical Exam:  General: alert and cooperative Lochia: appropriate Uterine Fundus: firm Incision: healing well DVT Evaluation: No evidence of DVT seen on physical exam.  Discharge Diagnoses: Term Pregnancy-delivered  Discharge Information: Date: 02/07/2012 Activity: pelvic rest Diet: routine Medications: None, Ibuprofen and Percocet Condition: stable Instructions: refer to practice specific booklet Discharge to: home   Newborn Data: Live born female  Birth Weight: 7 lb 6.7 oz (3365 g) APGAR: 9, 10  Home with mother.  Shila Kruczek G 02/07/2012, 8:18 AM

## 2012-02-07 NOTE — Progress Notes (Signed)
Subjective: Postpartum Day 3: Cesarean Delivery Patient reports tolerating PO, + flatus and no problems voiding.    Objective: Vital signs in last 24 hours: Temp:  [97.9 F (36.6 C)-98.4 F (36.9 C)] 98.4 F (36.9 C) (04/15 0520) Pulse Rate:  [88-96] 91  (04/15 0520) Resp:  [18] 18  (04/15 0520) BP: (105-110)/(71-73) 108/72 mmHg (04/15 0520)  Physical Exam:  General: alert and cooperative Lochia: appropriate Uterine Fundus: firm Incision: healing well, staples left in place DVT Evaluation: No evidence of DVT seen on physical exam.  No results found for this basename: HGB:2,HCT:2 in the last 72 hours  Assessment/Plan: Status post Cesarean section. Doing well postoperatively.  Discharge home with standard precautions and return to clinic in 2-3 days for staple removal.  Skylan Lara G 02/07/2012, 8:06 AM

## 2012-02-07 NOTE — Discharge Instructions (Addendum)
Cesarean Delivery  °Cesarean delivery is the birth of a baby through a cut (incision) in the abdomen and womb (uterus).  °LET YOUR CAREGIVER KNOW ABOUT: °· Complications involving the pregnancy.  °· Allergies.  °· Medicines taken including herbs, eyedrops, over-the-counter medicines, and creams.  °· Use of steroids (by mouth or creams).  °· Previous problems with anesthetics or numbing medicine.  °· Previous surgery.  °· History of blood clots.  °· History of bleeding or blood problems.  °· Other health problems.  °RISKS AND COMPLICATIONS  °· Bleeding.  °· Infection.  °· Blood clots.  °· Injury to surrounding organs.  °· Anesthesia problems.  °· Injury to the baby.  °BEFORE THE PROCEDURE  °· A tube (Foley catheter) will be placed in your bladder. The Foley catheter drains the urine from your bladder into a bag. This keeps your bladder empty during surgery.  °· An intravenous access tube (IV) will be placed in your arm.  °· Hair may be removed from your pubic area and your lower abdomen. This is to prevent infection in the incision site.  °· You may be given an antacid medicine to drink. This will prevent acid contents in your stomach from going into your lungs if you vomit during the surgery.  °· You may be given an antibiotic medicine to prevent infection.  °PROCEDURE  °· You may be given medicine to numb the lower half of your body (regional anesthetic). If you were in labor, you may have already had an epidural in place which can be used in both labor and cesarean delivery. You may possibly be given medicine to make you sleep (general anesthetic) though this is not as common.  °· An incision will be made in your abdomen that extends to your uterus. There are 2 basic kinds of incisions:  °· The horizontal (transverse) incision. Horizontal incisions are used for most routine cesarean deliveries.  °· The vertical (up and down) incision. This is less commonly used. This is most often reserved for women who have a  serious complication (extreme prematurity) or under emergency situations.   °· The horizontal and vertical incisions may both be used at the same time. However, this is very uncommon.  °· Your baby will then be delivered.  °AFTER THE PROCEDURE  °· If you were awake during the surgery, you will see your baby right away. If you were asleep, you will see your baby as soon as you are awake.  °· You may breastfeed your baby after surgery.  °· You may be able to get up and walk the same day as the surgery. If you need to stay in bed for a period of time, you will receive help to turn, cough, and take deep breaths after surgery. This helps prevent lung problems such as pneumonia.  °· Do not get out of bed alone the first time after surgery. You will need help getting out of bed until you are able to do this by yourself.  °· You may be able to shower the day after your cesarean delivery.  After the bandage (dressing) is taken off the incision site, a nurse will assist you to shower, if you like.   °· You will have pneumatic compressing hose placed on your feet or lower legs. These hose are used to prevent blood clots. When you are up and walking regularly, they will no longer be necessary.   °· Do not cross your legs when you sit.  °· Save any blood clots that you   pass. If you pass a clot while on the toilet, do not flush it. Call for the nurse. Tell the nurse if you think you are bleeding too much or passing too many clots.  °· Start drinking liquids and eating food as directed by your caregiver. If your stomach is not ready, drinking and eating too soon can cause an increase in bloating and swelling of your intestine and abdomen. This is very uncomfortable.  °· You will be given medicine as needed. Let your caregivers know if you are hurting. They want you to be comfortable. You may also be given an antibiotic to prevent an infection.  °· Your IV will be taken out when you are drinking a reasonable amount of fluids. The  Foley catheter is taken out when you are up and walking.  °· If your blood type is Rh negative and your baby's blood type is Rh positive, you will be given a shot of anti-D immune globulin. This shot prevents you from having Rh problems with a future pregnancy. You should get the shot even if you had your tubes tied (tubal ligation).  °· If you are allowed to take the baby for a walk, place the baby in the bassinet and push it. Do not carry your baby in your arms.  °Document Released: 10/11/2005 Document Revised: 09/30/2011 Document Reviewed: 02/05/2011 °[employer]® Patient Information ©2012 [employer]. °

## 2012-02-08 ENCOUNTER — Encounter (HOSPITAL_COMMUNITY): Admission: RE | Payer: Self-pay | Source: Ambulatory Visit

## 2012-02-08 ENCOUNTER — Inpatient Hospital Stay (HOSPITAL_COMMUNITY): Admission: RE | Admit: 2012-02-08 | Payer: 59 | Source: Ambulatory Visit | Admitting: Obstetrics and Gynecology

## 2012-02-08 SURGERY — Surgical Case
Anesthesia: Regional | Laterality: Bilateral

## 2013-12-21 ENCOUNTER — Emergency Department (HOSPITAL_COMMUNITY): Payer: 59

## 2013-12-21 ENCOUNTER — Emergency Department (HOSPITAL_COMMUNITY)
Admission: EM | Admit: 2013-12-21 | Discharge: 2013-12-21 | Disposition: A | Discharge status: Home / Self Care | Payer: 59 | Attending: Emergency Medicine | Admitting: Emergency Medicine

## 2013-12-21 ENCOUNTER — Emergency Department (HOSPITAL_COMMUNITY)
Admission: EM | Admit: 2013-12-21 | Discharge: 2013-12-21 | Disposition: A | Discharge status: Other Acute Inpatient Hospital | Payer: 59 | Source: Home / Self Care | Attending: Family Medicine | Admitting: Family Medicine

## 2013-12-21 ENCOUNTER — Encounter (HOSPITAL_COMMUNITY): Payer: Self-pay | Admitting: Emergency Medicine

## 2013-12-21 DIAGNOSIS — M545 Low back pain, unspecified: Secondary | ICD-10-CM | POA: Insufficient documentation

## 2013-12-21 DIAGNOSIS — Z9889 Other specified postprocedural states: Secondary | ICD-10-CM | POA: Insufficient documentation

## 2013-12-21 DIAGNOSIS — Z91040 Latex allergy status: Secondary | ICD-10-CM | POA: Insufficient documentation

## 2013-12-21 DIAGNOSIS — M549 Dorsalgia, unspecified: Secondary | ICD-10-CM

## 2013-12-21 DIAGNOSIS — R109 Unspecified abdominal pain: Secondary | ICD-10-CM

## 2013-12-21 DIAGNOSIS — Z791 Long term (current) use of non-steroidal anti-inflammatories (NSAID): Secondary | ICD-10-CM | POA: Insufficient documentation

## 2013-12-21 DIAGNOSIS — R1013 Epigastric pain: Secondary | ICD-10-CM | POA: Insufficient documentation

## 2013-12-21 DIAGNOSIS — Z880 Allergy status to penicillin: Secondary | ICD-10-CM | POA: Insufficient documentation

## 2013-12-21 LAB — POCT URINALYSIS DIP (DEVICE)
Bilirubin Urine: NEGATIVE
Glucose, UA: NEGATIVE mg/dL
Ketones, ur: NEGATIVE mg/dL
Leukocytes, UA: NEGATIVE
Nitrite: NEGATIVE
PH: 6 (ref 5.0–8.0)
PROTEIN: NEGATIVE mg/dL
SPECIFIC GRAVITY, URINE: 1.025 (ref 1.005–1.030)
UROBILINOGEN UA: 0.2 mg/dL (ref 0.0–1.0)

## 2013-12-21 LAB — CBC WITH DIFFERENTIAL/PLATELET
Basophils Absolute: 0.1 10*3/uL (ref 0.0–0.1)
Basophils Relative: 1 % (ref 0–1)
EOS PCT: 1 % (ref 0–5)
Eosinophils Absolute: 0.1 10*3/uL (ref 0.0–0.7)
HEMATOCRIT: 40.9 % (ref 36.0–46.0)
HEMOGLOBIN: 14.1 g/dL (ref 12.0–15.0)
LYMPHS PCT: 26 % (ref 12–46)
Lymphs Abs: 2.2 10*3/uL (ref 0.7–4.0)
MCH: 32.6 pg (ref 26.0–34.0)
MCHC: 34.5 g/dL (ref 30.0–36.0)
MCV: 94.5 fL (ref 78.0–100.0)
MONO ABS: 0.6 10*3/uL (ref 0.1–1.0)
MONOS PCT: 8 % (ref 3–12)
NEUTROS ABS: 5.4 10*3/uL (ref 1.7–7.7)
Neutrophils Relative %: 65 % (ref 43–77)
Platelets: 202 10*3/uL (ref 150–400)
RBC: 4.33 MIL/uL (ref 3.87–5.11)
RDW: 12.8 % (ref 11.5–15.5)
WBC: 8.3 10*3/uL (ref 4.0–10.5)

## 2013-12-21 LAB — POCT I-STAT, CHEM 8
BUN: 9 mg/dL (ref 6–23)
CALCIUM ION: 1.22 mmol/L (ref 1.12–1.23)
CHLORIDE: 102 meq/L (ref 96–112)
Creatinine, Ser: 0.6 mg/dL (ref 0.50–1.10)
GLUCOSE: 88 mg/dL (ref 70–99)
HCT: 45 % (ref 36.0–46.0)
Hemoglobin: 15.3 g/dL — ABNORMAL HIGH (ref 12.0–15.0)
Potassium: 3.9 mEq/L (ref 3.7–5.3)
Sodium: 141 mEq/L (ref 137–147)
TCO2: 24 mmol/L (ref 0–100)

## 2013-12-21 LAB — POCT PREGNANCY, URINE: Preg Test, Ur: NEGATIVE

## 2013-12-21 MED ORDER — DIAZEPAM 5 MG PO TABS: 5.0000 mg | ORAL_TABLET | Freq: Three times a day (TID) | ORAL | Status: DC | PRN

## 2013-12-21 MED ORDER — ONDANSETRON HCL 4 MG PO TABS
4.0000 mg | ORAL_TABLET | Freq: Once | ORAL | Status: AC
  Administered 2013-12-21: 4 mg via ORAL
  Filled 2013-12-21: qty 1

## 2013-12-21 MED ORDER — HYDROCODONE-ACETAMINOPHEN 5-325 MG PO TABS
2.0000 | ORAL_TABLET | Freq: Once | ORAL | Status: AC
  Administered 2013-12-21: 1 via ORAL
  Filled 2013-12-21: qty 2

## 2013-12-21 MED ORDER — IOHEXOL 300 MG/ML  SOLN
25.0000 mL | Freq: Once | INTRAMUSCULAR | Status: AC | PRN
  Administered 2013-12-21: 25 mL via ORAL

## 2013-12-21 MED ORDER — IOHEXOL 300 MG/ML  SOLN
100.0000 mL | Freq: Once | INTRAMUSCULAR | Status: AC | PRN
  Administered 2013-12-21: 100 mL via INTRAVENOUS

## 2013-12-21 MED ORDER — ONDANSETRON HCL 4 MG PO TABS: 4.0000 mg | ORAL_TABLET | Freq: Four times a day (QID) | ORAL | Status: DC

## 2013-12-21 MED ORDER — HYDROCODONE-ACETAMINOPHEN 5-325 MG PO TABS: 1.0000 | ORAL_TABLET | Freq: Four times a day (QID) | ORAL | Status: DC | PRN

## 2013-12-21 NOTE — ED Notes (Addendum)
Patient transported to XR. 

## 2013-12-21 NOTE — ED Notes (Signed)
Pt felt wave of nausea upon returning from CT, now passing.

## 2013-12-21 NOTE — ED Notes (Signed)
Patient transported to CT 

## 2013-12-21 NOTE — ED Notes (Signed)
Returned from CT.

## 2013-12-21 NOTE — ED Provider Notes (Signed)
CSN: [id number]     Arrival date & time 12/21/13  1321 History   First MD Initiated Contact with Patient 12/21/13 1540     Chief Complaint  Patient presents with  . Back Pain  . Abdominal Pain     (Consider location/radiation/quality/duration/timing/severity/associated sxs/prior Treatment) Patient is a 38 y.o. female presenting with flank pain. The history is provided by the patient. No language interpreter was used.  Flank Pain The current episode started in the past 7 days. The problem has been waxing and waning. Pertinent negatives include no abdominal pain, chest pain, chills, coughing, fever, nausea, rash, vomiting or weakness.   patient is a 38 year old female who presents with flank pain and lumbar pain. She reports she has been having this pain ongoing for the last 3 days. She reports it was most severe yesterday morning when she woke up. She reports that she has been carrying her 74-year-old but denies any known injury. She also reports mild epigastric tenderness. No numbness, tingling or difficulty walking. No fever, chills or recent illness. No dysuria, hematuria or other urinary symptoms. She reports a family history of kidney stones that she denies ever having one.  Previously seen at urgent care with negative workup except for trace of blood in her urine. She denies chest pain, shortness of breath or difficulty breathing.   Past Medical History  Diagnosis Date  . Complication of anesthesia     sever itching with first C/S done in [place]   Past Surgical History  Procedure Laterality Date  . Pectus excavatum repair  1996  . Cesarean section      x2  . Dilation and curettage of uterus    . Vulvar lesion removal    . Cesarean section  02/04/2012    Procedure: CESAREAN SECTION;  Surgeon: Cyril Mourning, MD;  Location: [facility];  Service: Gynecology;  Laterality: N/A;  Repeat Cesarean Section Delivery  Boy  @  [phone], Apgars 9/10   History reviewed. No pertinent family  history. History  Substance Use Topics  . Smoking status: Never Smoker   . Smokeless tobacco: Never Used  . Alcohol Use: No   OB History   Grav Para Term Preterm Abortions TAB SAB Ect Mult Living   4 3 1  1     3      Review of Systems  Constitutional: Negative for fever and chills.  Respiratory: Negative for cough, shortness of breath, wheezing and stridor.   Cardiovascular: Negative for chest pain.  Gastrointestinal: Negative for nausea, vomiting and abdominal pain.  Genitourinary: Positive for flank pain. Negative for dysuria.  Musculoskeletal: Positive for back pain.  Skin: Negative for rash.  Neurological: Negative for weakness.  All other systems reviewed and are negative.      Allergies  Peach flavor; Fentanyl; Penicillins; Sulfonamide derivatives; and Latex  Home Medications   Current Outpatient Rx  Name  Route  Sig  Dispense  Refill  . acetaminophen (TYLENOL) 500 MG tablet   Oral   Take 500 mg by mouth every 6 (six) hours as needed. For back pain         . cyclobenzaprine (FLEXERIL) 5 MG tablet   Oral   Take 5 mg by mouth 3 (three) times daily as needed for muscle spasms.         [name] ibuprofen (ADVIL,MOTRIN) 200 MG tablet   Oral   Take 200-400 mg by mouth every 6 (six) hours as needed for fever, headache or mild pain.         [name]  meloxicam (MOBIC) 15 MG tablet   Oral   Take 15 mg by mouth daily.          BP 107/55  Pulse 85  Temp(Src) 98.3 F (36.8 C) (Oral)  Resp 14  SpO2 100%  LMP 11/30/2013 Physical Exam  Nursing note and vitals reviewed. Constitutional: She appears well-developed and well-nourished. She appears distressed.  HENT:  Head: Normocephalic and atraumatic.  Eyes: Conjunctivae and EOM are normal.  Neck: Normal range of motion. Neck supple. No JVD present. No tracheal deviation present. No thyromegaly present.  Cardiovascular: Normal rate, regular rhythm and normal heart sounds.   Pulmonary/Chest: Effort normal and breath  sounds normal. No respiratory distress. She has no wheezes.  Abdominal: Soft. Normal appearance and bowel sounds are normal. There is tenderness in the epigastric area. There is no rigidity, no rebound, no guarding, no tenderness at McBurney's point and negative Murphy's sign.  Musculoskeletal: Normal range of motion.       Arms: Bilateral flank and lumbar pain, right > left. No numbness, tingling or gait disturbances. Right lumbar area TTP. No spinal tenderness, stepoffs or deformity. Good strength and sensation.     Lymphadenopathy:    She has no cervical adenopathy.    ED Course  Procedures (including critical care time) Labs Review Labs Reviewed - No data to display Imaging Review No results found.  EKG Interpretation  None  MDM   Final diagnoses:  Back pain     Previous visit at [facility], lab results reviewed and unremarkable. Reassuring exam. Benign abdomen, no guarding or rebound tenderness. Negative chest x-ray. Abd/pelvis CT, no acute abdominal process. Pt feeling better after zofran and hydrocodone here. Given prescriptions for Hydrocodone, zofran and valium if needed for spasm. Discussed plan of care and pt agrees. Return precautions given.    Elisha Headland, NP 12/22/13 1407

## 2013-12-21 NOTE — ED Notes (Signed)
C/o yesterday onset pain in low back. Denies radiation of discomfort, , denies known injury, denies loss of function or numbness. Used motrin & 5 mg flerxaril w/o significantrelief. LMP 3 weeks ago

## 2013-12-21 NOTE — ED Provider Notes (Signed)
CSN: [id number]     Arrival date & time 12/21/13  2542 History   First MD Initiated Contact with Patient 12/21/13 1137     Chief Complaint  Patient presents with  . Back Pain   (Consider location/radiation/quality/duration/timing/severity/associated sxs/prior Treatment) HPI Comments: 20f presents complaining of severe flank pain. This is been going on for 3 days. The pain comes in waves and is worsened by any movement. She relates this possibly to carrying her child more if she should have but she denies any specific injury. She has severe pain in her bilateral flanks and lower back. This pain is localized, nonradiating. She has no numbness or weakness in her legs. No genital numbness, no loss of bowel or bladder control, no hematuria or dysuria. She has never smoked. She has no history of IV drug use. She has no significant medical problems. She denies abdominal pain. She has a family history of kidney stones but she has never had one.   Past Medical History  Diagnosis Date  . Complication of anesthesia     sever itching with first C/S done in [place]   Past Surgical History  Procedure Laterality Date  . Pectus excavatum repair  1996  . Cesarean section      x2  . Dilation and curettage of uterus    . Vulvar lesion removal    . Cesarean section  02/04/2012    Procedure: CESAREAN SECTION;  Surgeon: Cyril Mourning, MD;  Location: [facility];  Service: Gynecology;  Laterality: N/A;  Repeat Cesarean Section Delivery  Boy  @  [phone], Apgars 9/10   History reviewed. No pertinent family history. History  Substance Use Topics  . Smoking status: Never Smoker   . Smokeless tobacco: Never Used  . Alcohol Use: No   OB History   Grav Para Term Preterm Abortions TAB SAB Ect Mult Living   4 3 1  1     3      Review of Systems  Constitutional: Negative for fever and chills.  Eyes: Negative for visual disturbance.  Respiratory: Negative for cough and shortness of breath.   Cardiovascular:  Negative for chest pain, palpitations and leg swelling.  Gastrointestinal: Negative for nausea, vomiting and abdominal pain.  Endocrine: Negative for polydipsia and polyuria.  Genitourinary: Positive for flank pain. Negative for dysuria, urgency and frequency.  Musculoskeletal: Positive for back pain. Negative for arthralgias and myalgias.  Skin: Negative for rash.  Neurological: Negative for dizziness, weakness and light-headedness.    Allergies  Peach flavor; Fentanyl; Penicillins; Sulfonamide derivatives; and Latex  Home Medications   Current Outpatient Rx  Name  Route  Sig  Dispense  Refill  . acetaminophen (TYLENOL) 500 MG tablet   Oral   Take 500 mg by mouth every 6 (six) hours as needed. For back pain         . Prenatal Vit-Fe Fumarate-FA (PRENATAL MULTIVITAMIN) TABS   Oral   Take 1 tablet by mouth daily.          BP 111/73  Pulse 84  Temp(Src) 98.1 F (36.7 C) (Oral)  Resp 16  SpO2 99%  LMP 11/30/2013  Breastfeeding? No Physical Exam  Nursing note and vitals reviewed. Constitutional: She is oriented to person, place, and time. Vital signs are normal. She appears well-developed and well-nourished. No distress.  HENT:  Head: Normocephalic and atraumatic.  Neck: Normal range of motion. Neck supple. No JVD present.  Pulmonary/Chest: Effort normal. No respiratory distress.  Abdominal: There is no  tenderness. There is no CVA tenderness.  Musculoskeletal:       Thoracic back: She exhibits decreased range of motion and pain. She exhibits no tenderness, no bony tenderness, no swelling, no edema, no deformity and no spasm.       Lumbar back: She exhibits decreased range of motion and pain. She exhibits no tenderness, no bony tenderness, no edema, no laceration and no spasm.  Neurological: She is alert and oriented to person, place, and time. She has normal strength. Coordination normal.  Skin: Skin is warm and dry. No rash noted. She is not diaphoretic.  Psychiatric:  She has a normal mood and affect. Judgment normal.    ED Course  Procedures (including critical care time) Labs Review Labs Reviewed  POCT I-STAT, CHEM 8 - Abnormal; Notable for the following:    Hemoglobin 15.3 (*)    All other components within normal limits  POCT URINALYSIS DIP (DEVICE) - Abnormal; Notable for the following:    Hgb urine dipstick TRACE (*)    All other components within normal limits  CBC WITH DIFFERENTIAL  I-STAT CHEM 8, ED  POCT PREGNANCY, URINE   Imaging Review No results found.   MDM   1. Flank pain   2. Back pain    Patient has significant flank pain, back is nontender, no CVA tenderness, and only trace urine on UA. Does not seem to be musculoskeletal in origin, this patient would benefit from CT scan to rule out referred pain from abdominal pathology, AAA. Transferred to ED via [place], PA-C 12/21/13 1302

## 2013-12-21 NOTE — ED Notes (Signed)
Pt feels "not good" following IV dye and oral pain meds.  Will continue to watch for a few minutes to ensure she improves prior to discharging.

## 2013-12-21 NOTE — ED Notes (Signed)
Per pt sts upper back pain severe when moving and breathing. sts abdominal tenderness. sts family hx of kidney stones and hematuria at [facility].

## 2013-12-21 NOTE — ED Notes (Signed)
Introduced self to pt and family member.  Pt comfortable at this time.

## 2013-12-21 NOTE — Discharge Instructions (Signed)
Back Pain, Adult Low back pain is very common. About 1 in 5 people have back pain.The cause of low back pain is rarely dangerous. The pain often gets better over time.About half of people with a sudden onset of back pain feel better in just 2 weeks. About 8 in 10 people feel better by 6 weeks.  CAUSES Some common causes of back pain include:  Strain of the muscles or ligaments supporting the spine.  Wear and tear (degeneration) of the spinal discs.  Arthritis.  Direct injury to the back. DIAGNOSIS Most of the time, the direct cause of low back pain is not known.However, back pain can be treated effectively even when the exact cause of the pain is unknown.Answering your caregiver's questions about your overall health and symptoms is one of the most accurate ways to make sure the cause of your pain is not dangerous. If your caregiver needs more information, he or she may order lab work or imaging tests (X-rays or MRIs).However, even if imaging tests show changes in your back, this usually does not require surgery. HOME CARE INSTRUCTIONS For many people, back pain returns.Since low back pain is rarely dangerous, it is often a condition that people can learn to [facility] their own.   Remain active. It is stressful on the back to sit or stand in one place. Do not sit, drive, or stand in one place for more than 30 minutes at a time. Take short walks on level surfaces as soon as pain allows.Try to increase the length of time you walk each day.  Do not stay in bed.Resting more than 1 or 2 days can delay your recovery.  Do not avoid exercise or work.Your body is made to move.It is not dangerous to be active, even though your back may hurt.Your back will likely heal faster if you return to being active before your pain is gone.  Pay attention to your body when you bend and lift. Many people have less discomfortwhen lifting if they bend their knees, keep the load close to their bodies,and  avoid twisting. Often, the most comfortable positions are those that put less stress on your recovering back.  Find a comfortable position to sleep. Use a firm mattress and lie on your side with your knees slightly bent. If you lie on your back, put a pillow under your knees.  Only take over-the-counter or prescription medicines as directed by your caregiver. Over-the-counter medicines to reduce pain and inflammation are often the most helpful.Your caregiver may prescribe muscle relaxant drugs.These medicines help dull your pain so you can more quickly return to your normal activities and healthy exercise.  Put ice on the injured area.  Put ice in a plastic bag.  Place a towel between your skin and the bag.  Leave the ice on for 15-20 minutes, 03-04 times a day for the first 2 to 3 days. After that, ice and heat may be alternated to reduce pain and spasms.  Ask your caregiver about trying back exercises and gentle massage. This may be of some benefit.  Avoid feeling anxious or stressed.Stress increases muscle tension and can worsen back pain.It is important to recognize when you are anxious or stressed and learn ways to manage it.Exercise is a great option. SEEK MEDICAL CARE IF:  You have pain that is not relieved with rest or medicine.  You have pain that does not improve in 1 week.  You have new symptoms.  You are generally not feeling well. SEEK  IMMEDIATE MEDICAL CARE IF:   You have pain that radiates from your back into your legs.  You develop new bowel or bladder control problems.  You have unusual weakness or numbness in your arms or legs.  You develop nausea or vomiting.  You develop abdominal pain.  You feel faint. Document Released: 10/11/2005 Document Revised: 04/11/2012 Document Reviewed: 03/01/2011 [facility] Patient Information 2014 [place], [place].  Take Hydrocodone as prescribed for pain Heat/ice therapy  Rest zofran if needed for nausea Valium for  spasms take in the evening

## 2013-12-21 NOTE — ED Notes (Signed)
CT called and informed pt finished drinking PO contrast.

## 2013-12-23 NOTE — ED Provider Notes (Signed)
Medical screening examination/treatment/procedure(s) were performed by a resident physician or non-physician practitioner and as the supervising physician I was immediately available for consultation/collaboration.  Lynne Leader, MD    Gregor Hams, MD 12/23/13 [phone]

## 2014-01-01 NOTE — ED Provider Notes (Signed)
Medical screening examination/treatment/procedure(s) were performed by non-physician practitioner and as supervising physician I was immediately available for consultation/collaboration.   EKG Interpretation None        Saddie Benders. Leathie Weich, MD 01/01/14 1342

## 2014-02-12 ENCOUNTER — Other Ambulatory Visit (INDEPENDENT_AMBULATORY_CARE_PROVIDER_SITE_OTHER): Payer: 59 | Admitting: *Deleted

## 2014-02-12 DIAGNOSIS — L0291 Cutaneous abscess, unspecified: Secondary | ICD-10-CM

## 2014-02-12 DIAGNOSIS — L0390 Cellulitis, unspecified: Secondary | ICD-10-CM

## 2014-02-12 MED ORDER — CEFTRIAXONE SODIUM 1 G IJ SOLR
1.0000 g | Freq: Once | INTRAMUSCULAR | Status: AC
  Administered 2014-02-12: 1 g via INTRAMUSCULAR

## 2014-02-12 MED ORDER — CEFTRIAXONE SODIUM 1 G IJ SOLR: 1.0000 g | INTRAMUSCULAR | Status: AC

## 2014-02-12 NOTE — Progress Notes (Signed)
Patient came in with cellulitis to the left elbow. Per dr. Laurance Flatten administer 1gm of rocephin. This was given on Saturday after hours

## 2014-06-14 ENCOUNTER — Telehealth: Payer: Self-pay | Admitting: *Deleted

## 2014-06-14 MED ORDER — FLUCONAZOLE 150 MG PO TABS: ORAL_TABLET | ORAL | Status: DC

## 2014-06-14 NOTE — Telephone Encounter (Signed)
Dr moore's daughter - needs something for yeast infection- ok per MMM

## 2014-06-17 ENCOUNTER — Telehealth: Payer: Self-pay | Admitting: *Deleted

## 2014-06-17 MED ORDER — CIPROFLOXACIN HCL 500 MG PO TABS: 500.0000 mg | ORAL_TABLET | Freq: Two times a day (BID) | ORAL | Status: DC

## 2014-06-17 MED ORDER — PHENAZOPYRIDINE HCL 200 MG PO TABS: 200.0000 mg | ORAL_TABLET | Freq: Three times a day (TID) | ORAL | Status: DC | PRN

## 2014-06-17 NOTE — Telephone Encounter (Signed)
uti - med called in

## 2014-07-11 ENCOUNTER — Other Ambulatory Visit: Payer: Self-pay | Admitting: *Deleted

## 2014-07-11 DIAGNOSIS — R5381 Other malaise: Secondary | ICD-10-CM

## 2014-07-11 DIAGNOSIS — Z0000 Encounter for general adult medical examination without abnormal findings: Secondary | ICD-10-CM

## 2014-07-11 DIAGNOSIS — R5383 Other fatigue: Principal | ICD-10-CM

## 2014-07-12 ENCOUNTER — Other Ambulatory Visit (INDEPENDENT_AMBULATORY_CARE_PROVIDER_SITE_OTHER): Payer: 59

## 2014-07-12 DIAGNOSIS — R5383 Other fatigue: Principal | ICD-10-CM

## 2014-07-12 DIAGNOSIS — R5381 Other malaise: Secondary | ICD-10-CM

## 2014-07-12 DIAGNOSIS — Z0000 Encounter for general adult medical examination without abnormal findings: Secondary | ICD-10-CM

## 2014-07-12 LAB — POCT URINALYSIS DIPSTICK
BILIRUBIN UA: NEGATIVE
GLUCOSE UA: NEGATIVE
Ketones, UA: NEGATIVE
NITRITE UA: NEGATIVE
Spec Grav, UA: 1.025
Urobilinogen, UA: NEGATIVE
pH, UA: 6

## 2014-07-12 LAB — POCT UA - MICROSCOPIC ONLY
CASTS, UR, LPF, POC: NEGATIVE
Crystals, Ur, HPF, POC: NEGATIVE

## 2014-07-12 LAB — POCT CBC
Granulocyte percent: 83.4 %G — AB (ref 37–80)
HCT, POC: 40.8 % (ref 37.7–47.9)
Hemoglobin: 13.9 g/dL (ref 12.2–16.2)
LYMPH, POC: 0.7 (ref 0.6–3.4)
MCH, POC: 31.7 pg — AB (ref 27–31.2)
MCHC: 34 g/dL (ref 31.8–35.4)
MCV: 93.2 fL (ref 80–97)
MPV: 8.7 fL (ref 0–99.8)
POC Granulocyte: 4.9 (ref 2–6.9)
POC LYMPH PERCENT: 12.4 %L (ref 10–50)
Platelet Count, POC: 169 10*3/uL (ref 142–424)
RBC: 4.4 M/uL (ref 4.04–5.48)
RDW, POC: 12.7 %
WBC: 5.9 10*3/uL (ref 4.6–10.2)

## 2014-07-12 NOTE — Progress Notes (Signed)
Lab only 

## 2014-07-13 LAB — HEPATIC FUNCTION PANEL
ALT: 17 IU/L (ref 0–32)
AST: 21 IU/L (ref 0–40)
Albumin: 4.6 g/dL (ref 3.5–5.5)
Alkaline Phosphatase: 40 IU/L (ref 39–117)
Bilirubin, Direct: 0.19 mg/dL (ref 0.00–0.40)
TOTAL PROTEIN: 7.1 g/dL (ref 6.0–8.5)
Total Bilirubin: 0.8 mg/dL (ref 0.0–1.2)

## 2014-07-13 LAB — VITAMIN B12: Vitamin B-12: 1528 pg/mL — ABNORMAL HIGH (ref 211–946)

## 2014-07-13 LAB — BMP8+EGFR
BUN/Creatinine Ratio: 22 — ABNORMAL HIGH (ref 8–20)
BUN: 15 mg/dL (ref 6–20)
CALCIUM: 8.8 mg/dL (ref 8.7–10.2)
CO2: 20 mmol/L (ref 18–29)
Chloride: 99 mmol/L (ref 97–108)
Creatinine, Ser: 0.68 mg/dL (ref 0.57–1.00)
GFR calc Af Amer: 129 mL/min/{1.73_m2} (ref >59)
GFR calc non Af Amer: 112 mL/min/{1.73_m2} (ref >59)
GLUCOSE: 80 mg/dL (ref 65–99)
Potassium: 4.2 mmol/L (ref 3.5–5.2)
Sodium: 136 mmol/L (ref 134–144)

## 2014-07-13 LAB — LIPID PANEL
Chol/HDL Ratio: 4 ratio units (ref 0.0–4.4)
Cholesterol, Total: 199 mg/dL (ref 100–199)
HDL: 50 mg/dL (ref >39)
LDL CALC: 134 mg/dL — AB (ref 0–99)
TRIGLYCERIDES: 76 mg/dL (ref 0–149)
VLDL Cholesterol Cal: 15 mg/dL (ref 5–40)

## 2014-07-13 LAB — VITAMIN D 25 HYDROXY (VIT D DEFICIENCY, FRACTURES): Vit D, 25-Hydroxy: 45.9 ng/mL (ref 30.0–100.0)

## 2014-07-13 LAB — SEDIMENTATION RATE: Sed Rate: 16 mm/hr (ref 0–32)

## 2014-07-13 LAB — THYROID PANEL WITH TSH
FREE THYROXINE INDEX: 2.1 (ref 1.2–4.9)
T3 UPTAKE RATIO: 29 % (ref 24–39)
T4, Total: 7.2 ug/dL (ref 4.5–12.0)
TSH: 0.774 u[IU]/mL (ref 0.450–4.500)

## 2014-08-26 ENCOUNTER — Encounter (HOSPITAL_COMMUNITY): Payer: Self-pay | Admitting: Emergency Medicine

## 2015-06-23 ENCOUNTER — Ambulatory Visit: Payer: 59 | Admitting: Family Medicine

## 2015-06-26 ENCOUNTER — Ambulatory Visit (INDEPENDENT_AMBULATORY_CARE_PROVIDER_SITE_OTHER): Payer: 59 | Admitting: Family Medicine

## 2015-06-26 ENCOUNTER — Encounter: Payer: Self-pay | Admitting: Family Medicine

## 2015-06-26 VITALS — BP 101/49 | Ht 60.0 in | Wt 119.0 lb

## 2015-06-26 DIAGNOSIS — M79661 Pain in right lower leg: Secondary | ICD-10-CM | POA: Diagnosis not present

## 2015-06-26 DIAGNOSIS — M25562 Pain in left knee: Secondary | ICD-10-CM

## 2015-06-26 NOTE — Patient Instructions (Signed)
i think you had a small tear in the calf muscle---also called a calf strain. It seems to be healing normally. There is no evidence of tear now on ultrasound now. I would recommend a few weeks of calf strengthening. I think [place] demonstrated those for you today.  Regarding the knee issues, I think you are having some inflammation in your VMO muscle. This should resolve with some relative rest---decrease your running by about 25% for a couple of weeks---ICE for 15 minutes after each run and add the Quadricep strengthening exercises we gave you in handout form. I expect your issues to resolve totally within the next 3-4 weeks.   Great to meet you! Please call with any questions or problems. You  Can follow up PRN.

## 2015-06-28 NOTE — Progress Notes (Signed)
Patient ID: Katelyn Shelton, female   DOB: 1976/02/28, 39 y.o.   MRN: [id number]  Katelyn Shelton - 39 y.o. female MRN [id number]  Date of birth: 07-20-1976    SUBJECTIVE:     Left knn\ee and right calf pain. Both for about 4-6 weeks. Knee started first---went on vacation and increased running from a mile a day to 2 miles a day abruptly. Started having pain knee with running and some after, mostly sore after. Still pain with running, none at rest, not tender today.  About same time right calf injury while on treadmill---heard and felt a pop, instant pain but not excruciating. Stopped exercising but continued ADLs. Next day,  Could run some but had to stop secondary to calf pain. Pain and swelling in calf have improved. Was never bad enough to affect her while walking. ROS:     See HPI additionally, no fever sweats or chills.No SOB. No ankle swelling, no redness of calf or knee, no knee swelling, no knee locking or giving way.  PERTINENT  PMH / PSH FH / / SH:  Past Medical, Surgical, Social, and Family History Reviewed & Updated in the EMR.  Pertinent findings include:  No prior hx knee or calf  injury or surgery except as in HPI No personal hx of DM, non smoker   OBJECTIVE: BP 101/49 mmHg  Ht 5' (1.524 m)  Wt 119 lb (53.978 kg)  BMI 23.24 kg/m2  Physical Exam:  Vital signs are reviewed. GEN: WD WN NAD KNEES: B no effusion, symmetrical. She has patella that track medially on both sides. No crepitus, no pain with patellar grind  LEFT KNEE: Area she points to as location of pain is distal VMO where it inserts into joint capsule. It s not tender to palpation. Ligamentously  Intact to varus and valgus stress, normal lachman, nontender atellar and quad tendons, no joint line tenderness to palpation. VMO development ois modest but B symmetrical. CALVES: B nontender to palpation, no erythema, no swelling, symmetrical. RIGHT CALF: area she pointsto as location of previous pain is over lateral head of  gastrocnemius, not really  ttp now. No defect noted in calf. Normal thompson test. ANKLE: B Normal strength plantar and dorsiflexion. [place]: LEFT Knee: normal menisci, no effusion, normal quad and patellar tendon, VMO looks normal with no sign of tear and no increase in doppler activity.  CALF RIGHT:No defects notes in calf musculature, no increased areas of doppler activity. Achilles looks intact and without any defect or injury. No enlargement of achilles.  ASSESSMENT & PLAN:  1, KNEE PAIN LEFT: I think sudden increase from 1 mile a day to 2 mile a day caused some overwork of  VMO at its jointing with joint capsule.  We discussed appropriate (as in aboit 10%) increase in mileage at a single time. Her patella do not track perfectly in the groove and this may have predisposed her to this injury. I would have her do short arc quadricep exercises, goal to increase ALL quad groups but lateral and medial quad especially given her anatomy, Reassured nothing "broken" or "worn out".  2. CALF PAIN RIGHT: mild strain, seems to have resolved as well. I would rehab with some eccentric calf exercises. We gave her handouts and instruction on both exercise regimens.  F/u RPN. Ease back into activity gradually.[name]

## 2015-09-17 ENCOUNTER — Ambulatory Visit (INDEPENDENT_AMBULATORY_CARE_PROVIDER_SITE_OTHER): Payer: 59

## 2015-09-17 ENCOUNTER — Ambulatory Visit: Payer: 59 | Admitting: Licensed Clinical Social Worker

## 2015-09-17 DIAGNOSIS — Z23 Encounter for immunization: Secondary | ICD-10-CM

## 2015-09-26 ENCOUNTER — Ambulatory Visit (INDEPENDENT_AMBULATORY_CARE_PROVIDER_SITE_OTHER): Payer: 59 | Admitting: Licensed Clinical Social Worker

## 2015-09-26 DIAGNOSIS — F419 Anxiety disorder, unspecified: Secondary | ICD-10-CM

## 2015-10-14 ENCOUNTER — Ambulatory Visit (INDEPENDENT_AMBULATORY_CARE_PROVIDER_SITE_OTHER): Payer: 59 | Admitting: Licensed Clinical Social Worker

## 2015-10-14 DIAGNOSIS — F419 Anxiety disorder, unspecified: Secondary | ICD-10-CM

## 2015-12-10 ENCOUNTER — Ambulatory Visit (INDEPENDENT_AMBULATORY_CARE_PROVIDER_SITE_OTHER): Payer: 59 | Admitting: Licensed Clinical Social Worker

## 2015-12-10 DIAGNOSIS — F419 Anxiety disorder, unspecified: Secondary | ICD-10-CM

## 2015-12-17 DIAGNOSIS — Z6822 Body mass index (BMI) 22.0-22.9, adult: Secondary | ICD-10-CM | POA: Diagnosis not present

## 2015-12-17 DIAGNOSIS — Z124 Encounter for screening for malignant neoplasm of cervix: Secondary | ICD-10-CM | POA: Diagnosis not present

## 2015-12-17 DIAGNOSIS — Z01419 Encounter for gynecological examination (general) (routine) without abnormal findings: Secondary | ICD-10-CM | POA: Diagnosis not present

## 2016-01-01 DIAGNOSIS — L700 Acne vulgaris: Secondary | ICD-10-CM | POA: Diagnosis not present

## 2016-01-01 DIAGNOSIS — D2272 Melanocytic nevi of left lower limb, including hip: Secondary | ICD-10-CM | POA: Diagnosis not present

## 2016-01-01 DIAGNOSIS — D225 Melanocytic nevi of trunk: Secondary | ICD-10-CM | POA: Diagnosis not present

## 2016-01-01 DIAGNOSIS — L218 Other seborrheic dermatitis: Secondary | ICD-10-CM | POA: Diagnosis not present

## 2016-01-01 DIAGNOSIS — D2271 Melanocytic nevi of right lower limb, including hip: Secondary | ICD-10-CM | POA: Diagnosis not present

## 2016-01-01 DIAGNOSIS — D235 Other benign neoplasm of skin of trunk: Secondary | ICD-10-CM | POA: Diagnosis not present

## 2016-01-01 DIAGNOSIS — D2262 Melanocytic nevi of left upper limb, including shoulder: Secondary | ICD-10-CM | POA: Diagnosis not present

## 2016-01-01 DIAGNOSIS — D2261 Melanocytic nevi of right upper limb, including shoulder: Secondary | ICD-10-CM | POA: Diagnosis not present

## 2016-01-01 DIAGNOSIS — D2372 Other benign neoplasm of skin of left lower limb, including hip: Secondary | ICD-10-CM | POA: Diagnosis not present

## 2016-01-07 ENCOUNTER — Ambulatory Visit (INDEPENDENT_AMBULATORY_CARE_PROVIDER_SITE_OTHER): Payer: 59 | Admitting: Licensed Clinical Social Worker

## 2016-01-07 DIAGNOSIS — F419 Anxiety disorder, unspecified: Secondary | ICD-10-CM

## 2016-02-13 ENCOUNTER — Ambulatory Visit (INDEPENDENT_AMBULATORY_CARE_PROVIDER_SITE_OTHER): Payer: 59 | Admitting: Licensed Clinical Social Worker

## 2016-02-13 DIAGNOSIS — F419 Anxiety disorder, unspecified: Secondary | ICD-10-CM | POA: Diagnosis not present

## 2016-03-12 ENCOUNTER — Ambulatory Visit (INDEPENDENT_AMBULATORY_CARE_PROVIDER_SITE_OTHER): Payer: 59 | Admitting: Licensed Clinical Social Worker

## 2016-03-12 DIAGNOSIS — F419 Anxiety disorder, unspecified: Secondary | ICD-10-CM

## 2016-03-15 ENCOUNTER — Ambulatory Visit: Payer: 59 | Admitting: Licensed Clinical Social Worker

## 2016-05-17 ENCOUNTER — Ambulatory Visit: Payer: 59 | Admitting: Licensed Clinical Social Worker

## 2016-07-13 DIAGNOSIS — D2372 Other benign neoplasm of skin of left lower limb, including hip: Secondary | ICD-10-CM | POA: Diagnosis not present

## 2016-09-16 DIAGNOSIS — Z23 Encounter for immunization: Secondary | ICD-10-CM

## 2016-09-28 ENCOUNTER — Ambulatory Visit (INDEPENDENT_AMBULATORY_CARE_PROVIDER_SITE_OTHER): Payer: 59

## 2016-09-28 DIAGNOSIS — Z23 Encounter for immunization: Secondary | ICD-10-CM

## 2017-01-23 DIAGNOSIS — C50919 Malignant neoplasm of unspecified site of unspecified female breast: Secondary | ICD-10-CM

## 2017-01-23 HISTORY — DX: Malignant neoplasm of unspecified site of unspecified female breast: C50.919

## 2017-01-31 ENCOUNTER — Ambulatory Visit (INDEPENDENT_AMBULATORY_CARE_PROVIDER_SITE_OTHER): Payer: 59 | Admitting: Licensed Clinical Social Worker

## 2017-01-31 DIAGNOSIS — F419 Anxiety disorder, unspecified: Secondary | ICD-10-CM | POA: Diagnosis not present

## 2017-02-04 DIAGNOSIS — Z6824 Body mass index (BMI) 24.0-24.9, adult: Secondary | ICD-10-CM | POA: Diagnosis not present

## 2017-02-04 DIAGNOSIS — Z01419 Encounter for gynecological examination (general) (routine) without abnormal findings: Secondary | ICD-10-CM | POA: Diagnosis not present

## 2017-02-07 ENCOUNTER — Other Ambulatory Visit: Payer: Self-pay | Admitting: Obstetrics and Gynecology

## 2017-02-07 DIAGNOSIS — R922 Inconclusive mammogram: Secondary | ICD-10-CM

## 2017-02-08 ENCOUNTER — Other Ambulatory Visit: Payer: Self-pay | Admitting: Obstetrics and Gynecology

## 2017-02-08 ENCOUNTER — Ambulatory Visit
Admission: RE | Admit: 2017-02-08 | Discharge: 2017-02-08 | Disposition: A | Discharge status: Home / Self Care | Payer: 59 | Source: Ambulatory Visit | Attending: Obstetrics and Gynecology | Admitting: Obstetrics and Gynecology

## 2017-02-08 ENCOUNTER — Other Ambulatory Visit: Payer: Self-pay

## 2017-02-08 DIAGNOSIS — R922 Inconclusive mammogram: Secondary | ICD-10-CM

## 2017-02-08 DIAGNOSIS — R928 Other abnormal and inconclusive findings on diagnostic imaging of breast: Secondary | ICD-10-CM | POA: Diagnosis not present

## 2017-02-08 DIAGNOSIS — C50411 Malignant neoplasm of upper-outer quadrant of right female breast: Secondary | ICD-10-CM | POA: Diagnosis not present

## 2017-02-08 DIAGNOSIS — N6311 Unspecified lump in the right breast, upper outer quadrant: Secondary | ICD-10-CM | POA: Diagnosis not present

## 2017-02-08 DIAGNOSIS — N6489 Other specified disorders of breast: Secondary | ICD-10-CM | POA: Diagnosis not present

## 2017-02-09 ENCOUNTER — Ambulatory Visit
Admission: RE | Admit: 2017-02-09 | Discharge: 2017-02-09 | Disposition: A | Discharge status: Home / Self Care | Payer: 59 | Source: Ambulatory Visit | Attending: Obstetrics and Gynecology | Admitting: Obstetrics and Gynecology

## 2017-02-09 ENCOUNTER — Other Ambulatory Visit: Payer: Self-pay | Admitting: Oncology

## 2017-02-09 ENCOUNTER — Other Ambulatory Visit: Payer: Self-pay

## 2017-02-09 ENCOUNTER — Other Ambulatory Visit: Payer: Self-pay | Admitting: Obstetrics and Gynecology

## 2017-02-09 DIAGNOSIS — R922 Inconclusive mammogram: Secondary | ICD-10-CM

## 2017-02-09 DIAGNOSIS — R921 Mammographic calcification found on diagnostic imaging of breast: Secondary | ICD-10-CM

## 2017-02-09 DIAGNOSIS — N6012 Diffuse cystic mastopathy of left breast: Secondary | ICD-10-CM | POA: Diagnosis not present

## 2017-02-09 MED ORDER — LORAZEPAM 0.5 MG PO TABS
0.2500 mg | ORAL_TABLET | Freq: Every evening | ORAL | 0 refills | Status: DC | PRN
Start: 2017-02-09 — End: 2017-02-21

## 2017-02-11 ENCOUNTER — Ambulatory Visit (HOSPITAL_BASED_OUTPATIENT_CLINIC_OR_DEPARTMENT_OTHER): Payer: 59 | Admitting: Oncology

## 2017-02-11 ENCOUNTER — Encounter: Payer: Self-pay | Admitting: *Deleted

## 2017-02-11 ENCOUNTER — Other Ambulatory Visit (HOSPITAL_COMMUNITY): Payer: Self-pay | Admitting: General Surgery

## 2017-02-11 VITALS — BP 115/66 | HR 63 | Temp 98.2°F | Resp 20 | Ht 60.0 in | Wt 123.3 lb

## 2017-02-11 DIAGNOSIS — C50411 Malignant neoplasm of upper-outer quadrant of right female breast: Secondary | ICD-10-CM | POA: Diagnosis not present

## 2017-02-11 DIAGNOSIS — Z170 Estrogen receptor positive status [ER+]: Secondary | ICD-10-CM

## 2017-02-11 MED ORDER — TAMOXIFEN CITRATE 20 MG PO TABS: 20.0000 mg | ORAL_TABLET | Freq: Every day | ORAL | 12 refills | Status: AC

## 2017-02-11 NOTE — Progress Notes (Signed)
[place]  Telephone:([phone]) [phone] Fax:(336) [phone]     ID: CHITARA CLONCH DOB: [birth year]  MR#: [id number]  RZN#:[id number]  Patient Care Team: Timmothy Euler, MD as PCP - General (Family Medicine) Chauncey Cruel, MD as Consulting Physician (Oncology) Rolm Bookbinder, MD as Consulting Physician (General Surgery) Louretta Shorten, MD as Consulting Physician (Obstetrics and Gynecology) Chauncey Cruel, MD OTHER MD:  CHIEF COMPLAINT:   CURRENT TREATMENT:    BREAST CANCER HISTORY: "[name]" saw Dr. Corinna Capra for routine follow-up and was found to have a palpable mass in the upper outer quadrant of her right breast and was set up for bilateral diagnostic mammography with tomography and right breast ultrasonography at the [facility] 02/08/2017. The breast density was category C. In the upper outer quadrant of the right breast there was an irregular mass. There was also a 0.3 cm group of slightly heterogeneous calcifications in the retroareolar left breast. On exam there was indeed a firm palpable mass at the 10:00 position of the right breast 4 cm from the nipple. On ultrasound this measured 3.5 cm, it was irregular and hypoechoic. There was no abnormal right axillary adenopathy.  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 The area of left breast calcifications was biopsied 02/09/2017 and showed only fibrocystic changes. ([id number]).  Her subsequent history is as detailed below.  INTERVAL HISTORY: [name] was evaluated in the breast clinic on 02/11/2017 accompanied by her husband [name] and her father [name].   REVIEW OF SYSTEMS: Aside from the breast mass itself there were no specific symptoms leading to the original mammogram, which was routinely  scheduled. The patient denies unusual headaches, visual changes, nausea, vomiting, stiff neck, dizziness, or gait imbalance. There has been no cough, phlegm production, or pleurisy, no chest pain or pressure, and no change in bowel or bladder habits. The patient denies fever, rash, bleeding, unexplained fatigue or unexplained weight loss. A detailed review of systems was otherwise entirely negative.   PAST MEDICAL HISTORY: Past Medical History:  Diagnosis Date  . Breast mass   . Complication of anesthesia    sever itching with first C/S done in [place]    PAST SURGICAL HISTORY: Past Surgical History:  Procedure Laterality Date  . CESAREAN SECTION     x2  . CESAREAN SECTION  02/04/2012   Procedure: CESAREAN SECTION;  Surgeon: Cyril Mourning, MD;  Location: [facility];  Service: Gynecology;  Laterality: N/A;  Repeat Cesarean Section Delivery  Boy  @  [phone], Apgars 9/10  . DILATION AND CURETTAGE OF UTERUS    . PECTUS EXCAVATUM REPAIR  1996  . VULVAR LESION REMOVAL      FAMILY HISTORY No family history on file. The patient's father, Carolyne Fiscal, is an Administrator, Civil Service, currently 40 years old. The patient's mother is 31 years old as of April 2018. Patient has no brothers, 2 sisters. There is a history breast cancer in the family in a great aunt on the patient's father's side. On the maternal side there is a female first cousin of the patient's mother with breast cancer. There is no history of ovarian cancer in the family  GYNECOLOGIC HISTORY:  Patient's last menstrual period was 01/16/2017 (approximate). [name] had her first menstrual period age 41, her first live birth age 41 She has had 3 C-sections.  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 Her husband [name] is one of our cardiologists the patient attends [employer] [place]: Social History  Substance Use Topics  . Smoking status: Never Smoker  . Smokeless tobacco: Never Used  . Alcohol use No     Colonoscopy:  PAP:  Bone density:   Allergies  Allergen Reactions  . Peach Flavor Anaphylaxis  . Fentanyl Itching  . Penicillins Hives    Childhood reaction  . Sulfonamide Derivatives Nausea And Vomiting  . Latex Itching    Current Outpatient Prescriptions  Medication Sig Dispense Refill  . acetaminophen (TYLENOL) 500 MG tablet Take 500 mg by mouth every 6 (six) hours as needed. For back pain    . buPROPion (WELLBUTRIN XL) 150 MG 24 hr tablet Take 150 mg by mouth daily.  6  . ciprofloxacin (CIPRO) 500 MG tablet Take 1 tablet (500 mg total) by mouth 2 (two) times daily. (Patient not taking: Reported on 06/28/2015) 20 tablet 0  . cyclobenzaprine (FLEXERIL) 5 MG tablet Take 5 mg by mouth 3 (three) times daily as needed for muscle spasms.    . diazepam (VALIUM) 5 MG tablet Take 1 tablet (5 mg total) by mouth every 8 (eight) hours as needed for muscle spasms. 10 tablet 0  . fluconazole (DIFLUCAN) 150 MG tablet One tab now and repeat one tab in one week 2 tablet 0  . HYDROcodone-acetaminophen (NORCO/VICODIN) 5-325 MG per tablet Take 1 tablet by mouth every 6 (six) hours as needed. 6 tablet 0  . ibuprofen (ADVIL,MOTRIN) 200 MG tablet Take 200-400 mg by mouth every 6 (six) hours as needed for fever, headache or mild pain.    [name] LORazepam (ATIVAN) 0.5 MG tablet Take 0.5-1 tablets (0.25-0.5 mg total) by mouth at bedtime as needed for anxiety. 30 tablet 0  . meloxicam (MOBIC) 15 MG tablet Take 15 mg by mouth daily.    . nitrofurantoin, macrocrystal-monohydrate, (MACROBID) 100 MG capsule TAKE 1 CAPSULE EVERY 12 HOURS DAILY.  0  . ondansetron (ZOFRAN) 4 MG tablet Take 1 tablet (4 mg total) by mouth every 6 (six) hours. (Patient not taking: Reported on 06/28/2015) 15 tablet 0  . phenazopyridine (PYRIDIUM) 200 MG tablet Take 1 tablet (200 mg total) by mouth 3  (three) times daily as needed for pain. (Patient not taking: Reported on 06/28/2015) 15 tablet 0  . tamoxifen (NOLVADEX) 20 MG tablet Take 1 tablet (20 mg total) by mouth daily. 90 tablet 12   No current facility-administered medications for this visit.     OBJECTIVE: Young white woman who was uncomfortable secondary to recent left breast biopsy Vitals:   02/11/17 1628  BP: 115/66  Pulse: 63  Resp: 20  Temp: 98.2 F (36.8 C)     Body mass index is 24.08 kg/m.    ECOG FS:1 - Symptomatic but completely ambulatory  Ocular: Sclerae unicteric, pupils equal, round and reactive to light Ear-nose-throat: Oropharynx clear and moist Lymphatic: No cervical or supraclavicular adenopathy Lungs no rales or rhonchi, good excursion bilaterally Heart regular rate and rhythm, no murmur appreciated Abd soft, nontender, positive bowel sounds MSK no focal spinal tenderness, no joint edema Neuro: non-focal, well-oriented, appropriate affect Breasts: The right breast is status post recent biopsy. There is a small ecchymosis. There is a  movable mass in the upper outer quadrant measuring a little over 2 cm, not associated with erythema, dimpling, or nipple retraction. In the left breast there is a more extensive ecchymosis but no palpable mass. Both axillae are benign.   LAB RESULTS:  CMP     Component Value Date/Time   NA 136 07/12/2014 0851   K 4.2 07/12/2014 0851   CL 99 07/12/2014 0851   CO2 20 07/12/2014 0851   GLUCOSE 80 07/12/2014 0851   GLUCOSE 88 12/21/2013 1226   BUN 15 07/12/2014 0851   CREATININE 0.68 07/12/2014 0851   CALCIUM 8.8 07/12/2014 0851   PROT 7.1 07/12/2014 0851   ALBUMIN 4.6 07/12/2014 0851   AST 21 07/12/2014 0851   ALT 17 07/12/2014 0851   ALKPHOS 40 07/12/2014 0851   BILITOT 0.8 07/12/2014 0851   GFRNONAA 112 07/12/2014 0851   GFRAA 129 07/12/2014 0851    No results found for: TOTALPROTELP, ALBUMINELP, A1GS, A2GS, BETS, BETA2SER, GAMS, MSPIKE, SPEI  No results  found for: Nils Pyle, [facility]  Lab Results  Component Value Date   WBC 5.9 07/12/2014   NEUTROABS 5.4 12/21/2013   HGB 13.9 07/12/2014   HCT 40.8 07/12/2014   MCV 93.2 07/12/2014   PLT 202 12/21/2013      Chemistry      Component Value Date/Time   NA 136 07/12/2014 0851   K 4.2 07/12/2014 0851   CL 99 07/12/2014 0851   CO2 20 07/12/2014 0851   BUN 15 07/12/2014 0851   CREATININE 0.68 07/12/2014 0851      Component Value Date/Time   CALCIUM 8.8 07/12/2014 0851   ALKPHOS 40 07/12/2014 0851   AST 21 07/12/2014 0851   ALT 17 07/12/2014 0851   BILITOT 0.8 07/12/2014 0851       No results found for: LABCA2  No components found for: [id number]  No results for input(s): INR in the last 168 hours.  Urinalysis    Component Value Date/Time   COLORURINE YELLOW 08/26/2011 1652   APPEARANCEUR CLEAR 08/26/2011 1652   LABSPEC 1.025 12/21/2013 1226   PHURINE 6.0 12/21/2013 1226   GLUCOSEU NEGATIVE 12/21/2013 1226   HGBUR TRACE (A) 12/21/2013 1226   BILIRUBINUR neg 07/12/2014 0913   KETONESUR NEGATIVE 12/21/2013 1226   PROTEINUR 4+ 07/12/2014 0913   PROTEINUR NEGATIVE 12/21/2013 1226   UROBILINOGEN negative 07/12/2014 0913   UROBILINOGEN 0.2 12/21/2013 1226   NITRITE neg 07/12/2014 0913   NITRITE NEGATIVE 12/21/2013 1226   LEUKOCYTESUR small (1+) 07/12/2014 0913     STUDIES: US Breast Ltd Uni Right Inc Axilla  Result Date: 02/08/2017 CLINICAL DATA:  41 year old female with palpable mass in the upper-outer right breast discovered on clinical examination. Baseline mammograms. EXAM: 2D DIGITAL DIAGNOSTIC BILATERAL MAMMOGRAM WITH CAD AND ADJUNCT TOMO ULTRASOUND RIGHT BREAST COMPARISON:  None ACR Breast Density Category c: The breast tissue is heterogeneously dense, which may obscure small masses. FINDINGS: 2D and 3D full field views of both breasts, spot compression views of the right breast and magnification views of the left breast are performed. A  irregular mass containing heterogeneous calcifications is identified within the upper outer right breast. A 3 mm group of slightly heterogeneous calcifications within the retroareolar left breast are identified. Other punctate scattered left breast calcifications are noted. Mammographic images were processed with CAD. On physical exam, a firm palpable mass is identified at the 10 o'clock position of the right breast 4 cm from the nipple. Targeted ultrasound is performed, showing a 4  x 1.6 x 3.5 cm irregular hypoechoic mass at the 10 o'clock position of the right breast 4 cm from the nipple. No abnormal right axillary lymph nodes are identified. IMPRESSION: Highly suspicious 4 cm mass within the upper-outer right breast. Tissue sampling is recommended. Indeterminate but more likely benign 3 mm group of retroareolar left breast calcifications. Given the highly suspicious findings on the right side, tissue sampling is recommended. No abnormal right axillary lymph nodes identified. RECOMMENDATION: Ultrasound-guided right breast biopsy, which will be performed today but dictated in a separate report. Stereotactic guided biopsy of retroareolar left breast calcifications, which will be scheduled. I have discussed the findings and recommendations with the patient. Results were also provided in writing at the conclusion of the visit. If applicable, a reminder letter will be sent to the patient regarding the next appointment. BI-RADS CATEGORY  5: Highly suggestive of malignancy. Electronically Signed   By: Margarette Canada M.D.   On: 02/08/2017 10:23   Mm Diag Breast Tomo Bilateral  Result Date: 02/08/2017 CLINICAL DATA:  41 year old female with palpable mass in the upper-outer right breast discovered on clinical examination. Baseline mammograms. EXAM: 2D DIGITAL DIAGNOSTIC BILATERAL MAMMOGRAM WITH CAD AND ADJUNCT TOMO ULTRASOUND RIGHT BREAST COMPARISON:  None ACR Breast Density Category c: The breast tissue is heterogeneously  dense, which may obscure small masses. FINDINGS: 2D and 3D full field views of both breasts, spot compression views of the right breast and magnification views of the left breast are performed. A irregular mass containing heterogeneous calcifications is identified within the upper outer right breast. A 3 mm group of slightly heterogeneous calcifications within the retroareolar left breast are identified. Other punctate scattered left breast calcifications are noted. Mammographic images were processed with CAD. On physical exam, a firm palpable mass is identified at the 10 o'clock position of the right breast 4 cm from the nipple. Targeted ultrasound is performed, showing a 4 x 1.6 x 3.5 cm irregular hypoechoic mass at the 10 o'clock position of the right breast 4 cm from the nipple. No abnormal right axillary lymph nodes are identified. IMPRESSION: Highly suspicious 4 cm mass within the upper-outer right breast. Tissue sampling is recommended. Indeterminate but more likely benign 3 mm group of retroareolar left breast calcifications. Given the highly suspicious findings on the right side, tissue sampling is recommended. No abnormal right axillary lymph nodes identified. RECOMMENDATION: Ultrasound-guided right breast biopsy, which will be performed today but dictated in a separate report. Stereotactic guided biopsy of retroareolar left breast calcifications, which will be scheduled. I have discussed the findings and recommendations with the patient. Results were also provided in writing at the conclusion of the visit. If applicable, a reminder letter will be sent to the patient regarding the next appointment. BI-RADS CATEGORY  5: Highly suggestive of malignancy. Electronically Signed   By: Margarette Canada M.D.   On: 02/08/2017 10:23   Mm Clip Placement Left  Result Date: 02/09/2017 CLINICAL DATA:  Status post stereotactic guided core biopsy of calcifications in the upper-outer quadrant of the left breast. EXAM:  DIAGNOSTIC LEFT MAMMOGRAM POST STEREOTACTIC BIOPSY COMPARISON:  Previous exam(s). FINDINGS: Mammographic images were obtained following stereotactic guided biopsy of faint calcifications in the upper-outer quadrant of the left breast. A coil shaped clip is identified in the upper-outer quadrant of the left breast. No significant residual calcifications are visible. IMPRESSION: Tissue marker clip in the expected location following biopsy. Final Assessment: Post Procedure Mammograms for Marker Placement Electronically Signed   By: Nolon Nations  M.D.   On: 02/09/2017 10:33   Mm Clip Placement Right  Result Date: 02/08/2017 CLINICAL DATA:  Status post ultrasound-guided core needle biopsy of a 4 cm mass in the 10 o'clock position of the right breast. EXAM: DIAGNOSTIC RIGHT MAMMOGRAM POST ULTRASOUND BIOPSY COMPARISON:  Previous exam(s). FINDINGS: Mammographic images were obtained following ultrasound guided biopsy of the recently demonstrated 4 cm mass in the 10 o'clock position of the right breast. These demonstrate a ribbon shaped biopsy marker clip within the medial aspect of the biopsied mass. IMPRESSION: Appropriate clip deployment following right breast ultrasound-guided core needle biopsy. Final Assessment: Post Procedure Mammograms for Marker Placement Electronically Signed   By: Claudie Revering M.D.   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 This was found to be concordant by Dr. Nolon Nations. Pathology results were discussed with the patient by telephone. The patient reported doing well after the biopsy with bruising and tenderness. Post biopsy instructions and care were reviewed and questions were answered. The patient was encouraged to call [facility] for any additional concerns. The patient has a recent diagnosis of right breast cancer and has appointments with Dr. Rolm Bookbinder and Dr. Tressa Busman on February 11, 2017. Pathology results reported by Susa Raring RN, BSN on 02/11/2017. Electronically Signed   By: Nolon Nations M.D.   On: 02/11/2017 07:28   Result Date: 02/11/2017 CLINICAL DATA:  Patient presents for stereotactic guided core biopsy of left breast calcifications. EXAM: LEFT BREAST STEREOTACTIC CORE NEEDLE BIOPSY COMPARISON:  Previous exams. FINDINGS: The patient and I discussed the procedure of stereotactic-guided biopsy including benefits and alternatives. We discussed the high likelihood of a successful procedure. We discussed the risks of the procedure including infection, bleeding, tissue injury, clip migration, and inadequate sampling. Informed written consent was given. The usual time out protocol was performed immediately prior to the procedure. Using sterile technique and 1% Lidocaine as local anesthetic, under stereotactic guidance, a 9 gauge vacuum assisted device was used to perform core needle biopsy of calcifications in the upper-outer quadrant of the left breast using a lateral approach. Specimen radiograph was performed showing faint calcifications to be present. Specimens with calcifications are identified for pathology. Lesion quadrant: Upper-outer quadrant of the left breast At the conclusion of the procedure, a coil shaped tissue marker clip was deployed into the biopsy cavity. Follow-up 2-view mammogram was performed and dictated separately. IMPRESSION: Stereotactic-guided biopsy of left breast calcifications. No apparent complications. Electronically Signed: By: Nolon Nations M.D. On: 02/09/2017 10:32   [place] Rt Breast Bx W Loc Dev 1st Lesion Img Bx Spec US Guide  Addendum Date: 02/11/2017   ADDENDUM REPORT: 02/11/2017 08:12 ADDENDUM: PATHOLOGY ADDENDUM: Breast, right, needle core biopsy, 10  o'clock - INVASIVE DUCTAL CARCINOMA. - DUCTAL CARCINOMA IN SITU WITH NECROSIS AND CALCIFICATIONS. Prognostic panel: ER 90% positive ; PR 90% positive, and Her2 negative Pathology concordance with imaging findings: yes Recommendation: Consultation regarding treatment plan I met with the patient and her father on 02/09/2017 to discuss results. The patient is scheduled to see Dr. Donne Hazel and Dr. Jana Hakim for consultation. The patient has done well following the biopsy. Questions were answered. Electronically Signed   By: Nolon Nations M.D.   On: 02/11/2017 08:12   Result Date: 02/11/2017  CLINICAL DATA:  4 cm mass in the 10 o'clock position of the right breast with imaging features highly suspicious for malignancy. EXAM: ULTRASOUND GUIDED RIGHT BREAST CORE NEEDLE BIOPSY COMPARISON:  Previous exam(s). FINDINGS: I met with the patient and we discussed the procedure of ultrasound-guided biopsy, including benefits and alternatives. We discussed the high likelihood of a successful procedure. We discussed the risks of the procedure, including infection, bleeding, tissue injury, clip migration, and inadequate sampling. Informed written consent was given. The usual time-out protocol was performed immediately prior to the procedure. Lesion quadrant: Right upper outer quadrant Using sterile technique and 1% Lidocaine as local anesthetic, under direct ultrasound visualization, a 12 gauge spring-loaded device was used to perform biopsy of the recently demonstrated 4 cm mass in the 10 o'clock position of the right breast using a caudal approach. At the conclusion of the procedure a ribbon shaped tissue marker clip was deployed into the biopsy cavity. Follow up 2 view mammogram was performed and dictated separately. IMPRESSION: Ultrasound guided biopsy of a 4 cm mass in the 10 o'clock position of the right breast. No apparent complications. Electronically Signed: By: Claudie Revering M.D. On: 02/08/2017 10:26    ELIGIBLE FOR  AVAILABLE RESEARCH PROTOCOL: no  ASSESSMENT: 41 y.o. [place] woman status post right breast upper outer quadrant biopsy 02/08/2017 a clinical T2 N0, stage IB invasive ductal carcinoma, grade 2, estrogen and progesterone receptor positive, HER-2 nonamplified, with an MIB-1 of 5%.  (a) biopsy of upper outer quadrant calcifications in the left breast 02/09/2017 were benign  (1) Oncotype DX will be obtained from the biopsy sample  (2) definitive surgery pending, likely bilateral nipple sparing mastectomies with immediate reconstruction  (3) adjuvant radiation to follow  (4) tamoxifen started neoadjuvantly 02/11/2017  (5) genetics counseling scheduled for 02/14/2017  PLAN: We spent the better part of today's hour-long appointment discussing the biology of breast cancer in general, and the specifics of the patient's tumor in particular.We first reviewed the fact that cancer is not one disease but more than 100 different diseases and that it is important to keep them separate-- otherwise when friends and relatives discuss their own cancer experiences with [name] confusion can result. Similarly we explained that if breast cancer spreads to the bone or liver, the patient would not have bone cancer or liver cancer, but breast cancer in the bone and breast cancer in the liver: one cancer in three places-- not 3 different cancers which otherwise would have to be treated in 3 different ways.  We discussed the difference between local and systemic therapy. In terms of loco-regional treatment, lumpectomy plus radiation is equivalent to mastectomy as far as survival is concerned. In this case, however, given the extent of disease in the right breast mastectomy may be unavoidable. This leads to consideration of possible bilateral mastectomies for symmetry, and also because of the patient's risk of future breast cancers in the remaining breast as well as considerations regarding the patient's poor tolerance of the  breast biopsies she has just undergone   We also noted that in terms of sequencing of treatments, whether systemic therapy or surgery is done first does not affect the ultimate outcome. In Beths case because we have Oncotype and genetics pending and because the patient is concerned regarding which type of surgery and reconstruction she will eventually have, as well as because of some family plans that may interfere with the timing of surgery, it would be prudent to start with anti-estrogens so the patient can  take as much time as she needs making her definitive plans.  We then discussed the rationale for systemic therapy. There is some risk that this cancer may have already spread to other parts of her body. Patients frequently ask at this point about bone scans, CAT scans and PET scans to find out if they have occult breast cancer somewhere else. The problem is that in early stage disease we are much more likely to find false positives then true cancers and this would expose the patient to unnecessary procedures as well as unnecessary radiation. Scans cannot answer the question the patient really would like to know, which is whether she has microscopic disease elsewhere in her body. For those reasons we do not recommend them.  Of course we would proceed to aggressive evaluation of any symptoms that might suggest metastatic disease, but that is not the case here.  Next we went over the options for systemic therapy which are anti-estrogens, anti-HER-2 immunotherapy, and chemotherapy. Leafy does not meet criteria for anti-HER-2 immunotherapy. She is a good candidate for anti-estrogens.  The question of chemotherapy is more complicated. Chemotherapy is most effective in rapidly growing, aggressive tumors. It is much less effective in not high-grade, slow growing cancers, like [name] 's. For that reason we are going to request an Oncotype from the right breast biopsy sample. That will help [place] make a definitive  decision regarding chemotherapy in this case. [name] understands that given her young age or bias will be in favor of chemotherapy if her Oncotype score is intermediate or high  Given her age and family history including a female relative with breast cancer, she has been scheduled for genetics testing. We discussed the difference between genetics test looking for a germline mutation and the genetic testing that will be done on her tumor which is really looking for genes being turned on and off. She understands that in the absence of a defined deleterious mutation there is no particular concern regarding ovarian cancer and no survival advantage from removing her ovaries.  We then discussed anti-estrogens. She has a good understanding of the possible toxicities, side effects and complications of tamoxifen. We are starting that today. She will call with any problems that she may experience from this medication. She understands it is not a contraceptive even though it may (or may not) interrupt her periods.  She is benefiting from lorazepam at bedtime. I suggested using it to or at most 3 times a week will allow this medication to continue to be effective for her. Her father suggested we consider Belsomra. This is a medication I am not familiar with and at present we are keeping it in our back pocket.  [name] has a good understanding of the overall plan. She agrees with it. She knows the goal of treatment in her case is cure. She will call with any problems that may develop before her next visit here, which will be in approximately one month.  Chauncey Cruel, MD   02/12/2017 8:46 AM Medical Oncology and Hematology [facility] [address] [place], [place] [address] Tel. [phone]    Fax. [phone]

## 2017-02-11 NOTE — Progress Notes (Signed)
Received order per Dr. Jana Hakim for Oncotype testing on core bx. Requisition sent to [facility]

## 2017-02-14 ENCOUNTER — Encounter: Payer: Self-pay | Admitting: Genetic Counselor

## 2017-02-14 ENCOUNTER — Other Ambulatory Visit: Payer: 59

## 2017-02-14 ENCOUNTER — Ambulatory Visit (HOSPITAL_BASED_OUTPATIENT_CLINIC_OR_DEPARTMENT_OTHER): Payer: 59 | Admitting: Genetic Counselor

## 2017-02-14 DIAGNOSIS — Z7183 Encounter for nonprocreative genetic counseling: Secondary | ICD-10-CM

## 2017-02-14 DIAGNOSIS — C50411 Malignant neoplasm of upper-outer quadrant of right female breast: Secondary | ICD-10-CM

## 2017-02-14 DIAGNOSIS — Z8042 Family history of malignant neoplasm of prostate: Secondary | ICD-10-CM

## 2017-02-14 DIAGNOSIS — Z170 Estrogen receptor positive status [ER+]: Secondary | ICD-10-CM | POA: Diagnosis not present

## 2017-02-14 DIAGNOSIS — Z800 Family history of malignant neoplasm of digestive organs: Secondary | ICD-10-CM | POA: Diagnosis not present

## 2017-02-14 NOTE — Progress Notes (Addendum)
REFERRING PROVIDER: Rolm Bookbinder, MD [place], Katelyn Shelton   Katelyn Del, MD  PRIMARY PROVIDER:  Kenn File, MD  PRIMARY REASON FOR VISIT:  1. Malignant neoplasm of upper-outer quadrant of right breast in female, estrogen receptor positive ([place])   2. Family history of prostate cancer   3. Family history of stomach cancer      HISTORY OF PRESENT ILLNESS:   Katelyn Shelton, a 41 y.o. female, was seen for a Katelyn Shelton at the request of Katelyn Shelton due to a personal history of cancer.  Katelyn Shelton presents to clinic today to discuss the possibility of a hereditary predisposition to cancer, genetic testing, and to further clarify her future cancer risks, as well as potential cancer risks for family members.   In April 2018, at the age of 15, Katelyn Shelton was diagnosed with invasive ductal carcinoma of the right breast. The tumor is ER+/PR+/Her2-. She is scheduled for a breast MRI to look at her left breast, as it appears that there are calcifications within that breast.   CANCER HISTORY:   No history exists.     HORMONAL RISK FACTORS:  Menarche was at age 49.  First live birth at age 83.  OCP use for approximately 22 years.  Ovaries intact: yes.  Hysterectomy: no.  Menopausal status: premenopausal.  HRT use: 0 years. Colonoscopy: no; not examined. Mammogram within the last year: yes. Number of breast biopsies: muliple. Up to date with pelvic exams:  yes. Any excessive radiation exposure in the past:  no  Past Medical History:  Diagnosis Date  . Breast mass   . Complication of anesthesia    sever itching with first C/S done in [place]  . Family history of prostate cancer   . Family history of stomach cancer     Past Surgical History:  Procedure Laterality Date  . CESAREAN SECTION     x2  . CESAREAN SECTION  02/04/2012   Procedure: CESAREAN SECTION;  Surgeon: Katelyn Mourning, MD;  Location: [facility];   Service: Gynecology;  Laterality: N/A;  Repeat Cesarean Section Delivery  Boy  @  [phone], Apgars 9/10  . DILATION AND CURETTAGE OF UTERUS    . PECTUS EXCAVATUM REPAIR  1996  . VULVAR LESION REMOVAL      Social History   Social History  . Marital status: Married    Spouse name: N/A  . Number of children: N/A  . Years of education: N/A   Social History Main Topics  . Smoking status: Never Smoker  . Smokeless tobacco: Never Used  . Alcohol use No  . Drug use: No  . Sexual activity: Yes   Other Topics Concern  . None   Social History Narrative  . None     FAMILY HISTORY:  We obtained a detailed, 4-generation family history.  Significant diagnoses are listed below: Family History  Problem Relation Age of Onset  . Skin cancer Mother   . Parkinson's disease Sister 25  . Diabetes Maternal Aunt   . Heart failure Maternal Grandmother   . Heart attack Maternal Grandfather   . Stomach cancer Paternal Grandmother   . Prostate cancer Paternal Grandfather   . Alzheimer's disease Paternal Grandfather   . Breast cancer Other 72    paternal great, great aunt  . Breast cancer Cousin     mother's paternal first FEMALE cousin    The patient has three children who are cancer free.  She had two sisters.  One sister was diagnosed with Parkinson's disease at age 16.  Her mother was diagnosed with skin cancer last week.  Her mother has a brother and sister.  Her sister died from complications of diabetes.  Her brother's daughters are at risk for a BRCA mutation that is inherited from their mother's side of the family.  The patient's maternal grandparents died from heart disease.  Her mother has a paternal first cousin who is female with breast cancer.  The patient's father is alive and well.  He has one brother.  His mother was an alcoholic and had stomach cancer.  Her maternal aunt, who raised him, had breast cancer.  His father died of prostate cancer.  Katelyn Shelton is unaware of previous family  history of genetic testing for hereditary cancer risks. Patient's maternal ancestors are of [place], [place] and [place] descent, and paternal ancestors are of English descent. There is no reported Ashkenazi Jewish ancestry. There possible known consanguinity between her parents.  GENETIC COUNSELING ASSESSMENT: Katelyn Shelton is a 41 y.o. female with a personal history of breast cancer which is somewhat suggestive of a hereditary cancer syndrome and predisposition to cancer. We, therefore, discussed and recommended the following at today's visit.   DISCUSSION: We discussed that about 5-10% of breat cancer is hereditary with most cases due to BRCA mutations.  We discussed that there are other causes of breast cancer.  We reviewed the characteristics, features and inheritance patterns of hereditary cancer syndromes. We also discussed genetic testing, including the appropriate family members to test, the process of testing, insurance coverage and turn-around-time for results. We discussed the implications of a negative, positive and/or variant of uncertain significant result. In order to get genetic test results in a timely manner so that Katelyn Shelton can use these genetic test results for surgical decisions, we recommended Katelyn Shelton pursue genetic testing for the Breast Cancer STAT panel that consists of 9 high and moderate risk genes. If this test is negative, we then recommend Katelyn Shelton pursue reflex genetic testing to the [facility] panel. The Multi-Gene Panel offered by [facility] includes sequencing and/or deletion duplication testing of the following 80 genes: ALK, APC, ATM, AXIN2,BAP1,  BARD1, BLM, BMPR1A, BRCA1, BRCA2, BRIP1, CASR, CDC73, CDH1, CDK4, CDKN1B, CDKN1C, CDKN2A (p14ARF), CDKN2A (p16INK4a), CEBPA, CHEK2, CTNNA1, DICER1, DIS3L2, EGFR (c.2369C>T, p.Thr790Met variant only), EPCAM (Deletion/duplication testing only), FH, FLCN, GATA2, GPC3, GREM1 (Promoter region deletion/duplication testing only),  HOXB13 (c.251G>A, p.Gly84Glu), HRAS, KIT, MAX, MEN1, MET, MITF (c.952G>A, p.Glu318Lys variant only), MLH1, MSH2, MSH3, MSH6, MUTYH, NBN, NF1, NF2, NTHL1, PALB2, PDGFRA, PHOX2B, PMS2, POLD1, POLE, POT1, PRKAR1A, PTCH1, PTEN, RAD50, RAD51C, RAD51D, RB1, RECQL4, RET, RUNX1, SDHAF2, SDHA (sequence changes only), SDHB, SDHC, SDHD, SMAD4, SMARCA4, SMARCB1, SMARCE1, STK11, SUFU, TERT, TERT, TMEM127, TP53, TSC1, TSC2, VHL, WRN and WT1.   Based on Ms. Losey's personal and family history of cancer, she meets medical criteria for genetic testing. Despite that she meets criteria, she may still have an out of pocket cost. We discussed that if her out of pocket cost for testing is over $100, the laboratory will call and confirm whether she wants to proceed with testing.  If the out of pocket cost of testing is less than $100 she will be billed by the genetic testing laboratory.   PLAN: After considering the risks, benefits, and limitations, Katelyn Shelton  provided informed consent to pursue genetic testing and the blood sample was sent to [facility] for analysis of the  Breast STAT panel, reflexing to the common hereditary cancer panel. Results should be available within approximately 1 weeks' time, at which point they will be disclosed by telephone to Katelyn Shelton, as will any additional recommendations warranted by these results. Katelyn Shelton will receive a summary of her genetic counseling visit and a copy of her results once available. This information will also be available in Epic. We encouraged Katelyn Shelton to remain in contact with cancer genetics annually so that we can continuously update the family history and inform her of any changes in cancer genetics and testing that may be of benefit for her family. Katelyn Shelton questions were answered to her satisfaction today. Our contact information was provided should additional questions or concerns arise.  Lastly, we encouraged Katelyn Shelton to remain in contact with  cancer genetics annually so that we can continuously update the family history and inform her of any changes in cancer genetics and testing that may be of benefit for this family.   Ms.  Shelton questions were answered to her satisfaction today. Our contact information was provided should additional questions or concerns arise. Thank you for the referral and allowing [place] to share in the care of your patient.   Olanda Downie P. Florene Glen, [place], [facility] Certified Genetic Counselor [email or web address] phone: [phone]  The patient was seen for a total of 45 minutes in face-to-face genetic counseling.  This patient was discussed with Drs. Magrinat, Lindi Adie and/or Burr Medico who agrees with the above.    _______________________________________________________________________ For Office Staff:  Number of people involved in session: 1 Was an Intern/ student involved with case: no

## 2017-02-15 ENCOUNTER — Telehealth: Payer: Self-pay | Admitting: *Deleted

## 2017-02-15 NOTE — Telephone Encounter (Signed)
Spoke to pt regarding next steps. Informed Dr. Donne Hazel will call and discuss MRI results. Discussed turn around time for oncotype testing and genetic testing. Encourage pt to call with further questions or needs.. Received verbal understanding.

## 2017-02-16 ENCOUNTER — Ambulatory Visit (HOSPITAL_COMMUNITY)
Admission: RE | Admit: 2017-02-16 | Discharge: 2017-02-16 | Disposition: A | Discharge status: Home / Self Care | Payer: 59 | Source: Ambulatory Visit | Attending: General Surgery | Admitting: General Surgery

## 2017-02-16 ENCOUNTER — Other Ambulatory Visit: Payer: Self-pay | Admitting: General Surgery

## 2017-02-16 DIAGNOSIS — C50411 Malignant neoplasm of upper-outer quadrant of right female breast: Secondary | ICD-10-CM | POA: Diagnosis not present

## 2017-02-16 DIAGNOSIS — Q676 Pectus excavatum: Secondary | ICD-10-CM | POA: Diagnosis not present

## 2017-02-16 MED ORDER — GADOBENATE DIMEGLUMINE 529 MG/ML IV SOLN
15.0000 mL | Freq: Once | INTRAVENOUS | Status: AC | PRN
  Administered 2017-02-16: 11 mL via INTRAVENOUS

## 2017-02-18 ENCOUNTER — Telehealth: Payer: Self-pay | Admitting: *Deleted

## 2017-02-18 DIAGNOSIS — Z170 Estrogen receptor positive status [ER+]: Secondary | ICD-10-CM | POA: Diagnosis not present

## 2017-02-18 DIAGNOSIS — C50411 Malignant neoplasm of upper-outer quadrant of right female breast: Secondary | ICD-10-CM | POA: Diagnosis not present

## 2017-02-18 NOTE — Telephone Encounter (Signed)
Received oncotype score of 11. Physician team notified.  Called pt with results.

## 2017-02-21 ENCOUNTER — Telehealth: Payer: Self-pay | Admitting: Genetic Counselor

## 2017-02-21 ENCOUNTER — Encounter: Payer: Self-pay | Admitting: Genetic Counselor

## 2017-02-21 ENCOUNTER — Other Ambulatory Visit: Payer: Self-pay | Admitting: Oncology

## 2017-02-21 ENCOUNTER — Encounter (HOSPITAL_BASED_OUTPATIENT_CLINIC_OR_DEPARTMENT_OTHER): Payer: Self-pay | Admitting: *Deleted

## 2017-02-21 ENCOUNTER — Ambulatory Visit: Payer: Self-pay | Admitting: Genetic Counselor

## 2017-02-21 DIAGNOSIS — Z1379 Encounter for other screening for genetic and chromosomal anomalies: Secondary | ICD-10-CM

## 2017-02-21 DIAGNOSIS — Z853 Personal history of malignant neoplasm of breast: Secondary | ICD-10-CM | POA: Diagnosis not present

## 2017-02-21 DIAGNOSIS — N939 Abnormal uterine and vaginal bleeding, unspecified: Secondary | ICD-10-CM | POA: Diagnosis not present

## 2017-02-21 DIAGNOSIS — N921 Excessive and frequent menstruation with irregular cycle: Secondary | ICD-10-CM | POA: Diagnosis not present

## 2017-02-21 DIAGNOSIS — Z8042 Family history of malignant neoplasm of prostate: Secondary | ICD-10-CM

## 2017-02-21 DIAGNOSIS — Z800 Family history of malignant neoplasm of digestive organs: Secondary | ICD-10-CM

## 2017-02-21 DIAGNOSIS — C50411 Malignant neoplasm of upper-outer quadrant of right female breast: Secondary | ICD-10-CM

## 2017-02-21 DIAGNOSIS — Z170 Estrogen receptor positive status [ER+]: Secondary | ICD-10-CM

## 2017-02-21 NOTE — Pre-Procedure Instructions (Signed)
To come pick up Boost Breeze 8 oz. - to drink by 0830 DOS.

## 2017-02-21 NOTE — Telephone Encounter (Signed)
Revealed negative genetic testing.  Discussed that we do not know why she has breast cancer or why there is cancer in the family. It could be due to a different gene that we are not testing, or maybe our current technology may not be able to pick something up.  It will be important for her to keep in contact with genetics to keep up with whether additional testing may be needed. 

## 2017-02-21 NOTE — Progress Notes (Signed)
HPI: Ms. Rossel was previously seen in the [facility] due to a personal and family history of cancer and concerns regarding a hereditary predisposition to cancer. Please refer to our prior cancer genetics clinic note for more information regarding Ms. Coach's medical, social and family histories, and our assessment and recommendations, at the time. Ms. Demarinis recent genetic test results were disclosed to her, as were recommendations warranted by these results. These results and recommendations are discussed in more detail below.  CANCER HISTORY:    Malignant neoplasm of upper-outer quadrant of right breast in female, estrogen receptor positive ([place])   02/11/2017 Initial Diagnosis    Malignant neoplasm of upper-outer quadrant of right breast in female, estrogen receptor positive ([place])     02/21/2017 Genetic Testing    Negative genetic testing on the STAT panel through [facility].  The STAT gene panel offered by [facility] and includes sequencing and rearrangement analysis for the following 9 genes: ATM, BRCA1, BRCA2, CDH1, CHEK2, PALB2, PTEN, STK11, and TP53.  The report date is February 19, 2017.  We re-requisitioned for a larger Multi gene panel, which was also negative.  The Multi-Gene Panel offered by [facility] includes sequencing and/or deletion duplication testing of the following 80 genes: ALK, APC, ATM, AXIN2,BAP1,  BARD1, BLM, BMPR1A, BRCA1, BRCA2, BRIP1, CASR, CDC73, CDH1, CDK4, CDKN1B, CDKN1C, CDKN2A (p14ARF), CDKN2A (p16INK4a), CEBPA, CHEK2, CTNNA1, DICER1, DIS3L2, EGFR (c.2369C>T, p.Thr790Met variant only), EPCAM (Deletion/duplication testing only), FH, FLCN, GATA2, GPC3, GREM1 (Promoter region deletion/duplication testing only), HOXB13 (c.251G>A, p.Gly84Glu), HRAS, KIT, MAX, MEN1, MET, MITF (c.952G>A, p.Glu318Lys variant only), MLH1, MSH2, MSH3, MSH6, MUTYH, NBN, NF1, NF2, NTHL1, PALB2, PDGFRA, PHOX2B, PMS2, POLD1, POLE, POT1, PRKAR1A, PTCH1, PTEN, RAD50,  RAD51C, RAD51D, RB1, RECQL4, RET, RUNX1, SDHAF2, SDHA (sequence changes only), SDHB, SDHC, SDHD, SMAD4, SMARCA4, SMARCB1, SMARCE1, STK11, SUFU, TERT, TERT, TMEM127, TP53, TSC1, TSC2, VHL, WRN and WT1.  The report date is February 21, 2017.        FAMILY HISTORY:  We obtained a detailed, 4-generation family history.  Significant diagnoses are listed below: Family History  Problem Relation Age of Onset  . Skin cancer Mother   . Parkinson's disease Sister 35  . Diabetes Maternal Aunt   . Heart failure Maternal Grandmother   . Heart attack Maternal Grandfather   . Stomach cancer Paternal Grandmother   . Prostate cancer Paternal Grandfather   . Alzheimer's disease Paternal Grandfather   . Breast cancer Cousin     mother's paternal first FEMALE cousin    The patient has three children who are cancer free.  She had two sisters.  One sister was diagnosed with Parkinson's disease at age 58.  Her mother was diagnosed with skin cancer last week.  Her mother has a brother and sister.  Her sister died from complications of diabetes.  Her brother's daughters are at risk for a BRCA mutation that is inherited from their mother's side of the family.  The patient's maternal grandparents died from heart disease.  Her mother has a paternal first cousin who is female with breast cancer.  The patient's father is alive and well.  He has one brother.  His mother was an alcoholic and had stomach cancer.  Her maternal aunt, who raised him, had breast cancer.  His father died of prostate cancer.  Ms. Eskin is unaware of previous family history of genetic testing for hereditary cancer risks. Patient's maternal ancestors are of [place], [place] and [place] descent, and paternal ancestors are  of English descent. There is no reported Ashkenazi Jewish ancestry. There possible known consanguinity between her parents.  GENETIC TEST RESULTS: Genetic testing reported out on February 19, 2017 through the STAT cancer panel found no  deleterious mutations.  Genetic testing was then re-requisitioned to the Multi-gene Cancer panel (83 genes). This too was reported out on February 21, 2017 and found no deleterious mutations.  The Multi-Gene Panel offered by [facility] includes sequencing and/or deletion duplication testing of the following 80 genes: ALK, APC, ATM, AXIN2,BAP1,  BARD1, BLM, BMPR1A, BRCA1, BRCA2, BRIP1, CASR, CDC73, CDH1, CDK4, CDKN1B, CDKN1C, CDKN2A (p14ARF), CDKN2A (p16INK4a), CEBPA, CHEK2, CTNNA1, DICER1, DIS3L2, EGFR (c.2369C>T, p.Thr790Met variant only), EPCAM (Deletion/duplication testing only), FH, FLCN, GATA2, GPC3, GREM1 (Promoter region deletion/duplication testing only), HOXB13 (c.251G>A, p.Gly84Glu), HRAS, KIT, MAX, MEN1, MET, MITF (c.952G>A, p.Glu318Lys variant only), MLH1, MSH2, MSH3, MSH6, MUTYH, NBN, NF1, NF2, NTHL1, PALB2, PDGFRA, PHOX2B, PMS2, POLD1, POLE, POT1, PRKAR1A, PTCH1, PTEN, RAD50, RAD51C, RAD51D, RB1, RECQL4, RET, RUNX1, SDHAF2, SDHA (sequence changes only), SDHB, SDHC, SDHD, SMAD4, SMARCA4, SMARCB1, SMARCE1, STK11, SUFU, TERT, TERT, TMEM127, TP53, TSC1, TSC2, VHL, WRN and WT1.  The test report has been scanned into EPIC and is located under the Molecular Pathology section of the Results Review tab.   We discussed with Ms. Kettlewell that since the current genetic testing is not perfect, it is possible there may be a gene mutation in one of these genes that current testing cannot detect, but that chance is small. We also discussed, that it is possible that another gene that has not yet been discovered, or that we have not yet tested, is responsible for the cancer diagnoses in the family, and it is, therefore, important to remain in touch with cancer genetics in the future so that we can continue to offer Ms. Aarons the most up to date genetic testing.   CANCER SCREENING RECOMMENDATIONS: This result is reassuring and indicates that Ms. Ground likely does not have an increased risk for a future cancer due to a  mutation in one of these genes. This normal test also suggests that Ms. Mccall's cancer was most likely not due to an inherited predisposition associated with one of these genes.  Most cancers happen by chance and this negative test suggests that her cancer falls into this category.  We, therefore, recommended she continue to follow the cancer management and screening guidelines provided by her oncology and primary healthcare provider.   RECOMMENDATIONS FOR FAMILY MEMBERS: Women in this family might be at some increased risk of developing cancer, over the general population risk, simply due to the family history of cancer. We recommended women in this family have a yearly mammogram beginning at age 56, or 48 years younger than the earliest onset of cancer, an annual clinical breast exam, and perform monthly breast self-exams. This means that her sisters, ages 44 and 41 should consider starting their mammograms now.  Her daughter should consider starting her mammograms at age 54, however, she should also talk with her providers as information may change between now and that time.  Women in this family should also have a gynecological exam as recommended by their primary provider. All family members should have a colonoscopy by age 55.  FOLLOW-UP: Lastly, we discussed with Ms. Rawdon that cancer genetics is a rapidly advancing field and it is possible that new genetic tests will be appropriate for her and/or her family members in the future. We encouraged her to remain in contact with cancer genetics on an  annual basis so we can update her personal and family histories and let her know of advances in cancer genetics that may benefit this family.   Our contact number was provided. Ms. Six questions were answered to her satisfaction, and she knows she is welcome to call us at anytime with additional questions or concerns.   Roma Kayser, MS, [facility] Certified Genetic  Counselor [email or web address]

## 2017-02-22 DIAGNOSIS — C50411 Malignant neoplasm of upper-outer quadrant of right female breast: Secondary | ICD-10-CM | POA: Diagnosis not present

## 2017-02-22 DIAGNOSIS — Z170 Estrogen receptor positive status [ER+]: Secondary | ICD-10-CM | POA: Diagnosis not present

## 2017-02-22 NOTE — H&P (Signed)
Subjective:     Patient ID: Katelyn Shelton is a 41 y.o. female.  HPI  Here for follow up discussion breast reconstruction. Presented with palpable mass right breast. MMG with mass containing heterogeneous calcifications  identified within the UOQ. Additional 3 mm group of slightly heterogeneous calcifications within the retroareolar left breast noted. US showed 4 x 1.6 x 3.5 cm mass at the 10 o'clock position 4 cm from the nipple. Biopsy demonstrated IDC with DCIS, ER/PR+, Her2-.  Oncotype 11. Genetics negative. Tamoxifen started.  Patient has elected for bilateral mastectomies.  PMH significant for silicone implant as part of pectus excavatum repair performed by Dr. Jaclynn Guarneri 1996.  Current 32B, happy with this.  Review of Systems     Objective:   Physical Exam  Cardiovascular: Normal rate, regular rhythm and normal heart sounds.   Pulmonary/Chest: Effort normal and breath sounds normal.  Abdominal:  Minimal redundancy soft tissue   No ptosis SN to nipple R 25 L 25 cm BW R 15 L 15 cm (CW 13 cm) Nipple to IMF R 6  L 6cm   scar mid chest, able to palpate implant chest  Assessment:     R breast ca UOQ Pectus excavatum, s/p silicone implant placement    Plan:     [facility] records, MRI reviewed and discussed with her primary surgeon. The chest wall implant is located midline suprasternal beneath medial pectoralis bilateral extending 14.0 cm (left-to-right) x 10.0 cm. Given this, I recommend prepectoral reconstruction. I do not recommend removal sternal implant as would be morbid to remove at this time and would leave chest wall asymmetry that would not be corrected by breast implants alone.  Plan bilateral TE and acellular dermis placement following bilateral NSM. Discussed prepectoral vs sub pectoral reconstruction. Discussed with patient and benefit of prepectoral is no animation deformity, may be less pain. Reviewed pre pectoral would require larger amount  acellular dermis, more drains. Discussed any type reconstruction also risks long term displacement implant and visible rippling. Reviewed incisions, drains, OR length, hospital stay and recovery. Discussed process of expansion and implant based risks including rupture, MRI surveillance for silicone implants, infection requiring surgery or removal, contracture. Discussed at time of surgery will have air in expanders and will exchange to saline few weeks postoperative.   Reviewed NSM will be asensate and not stimulate. Reviewed risk mastectomy flap necrosis requiring additional surgery or removal devices.  Discussed use of acellular dermis in reconstruction, cadaveric source, incorporation over several weeks, risk that if has seroma or infection can act as additional nidus for infection if not incorporated. Reviewed additional risks including but not limited to bleeding, hematoma, seroma, damage to deeper structures, unacceptable cosmetic result, DVT PE, cardiopulmonary complications.   Irene Limbo, MD [facility] Plastic & Reconstructive Surgery [phone], pin [phone]

## 2017-02-23 NOTE — Pre-Procedure Instructions (Signed)
Boost Breeze 8 oz. given to pt's husband with instructions to drink by 0830 DOS

## 2017-02-24 ENCOUNTER — Other Ambulatory Visit: Payer: Self-pay | Admitting: Oncology

## 2017-02-28 ENCOUNTER — Encounter (HOSPITAL_COMMUNITY)
Admission: RE | Admit: 2017-02-28 | Discharge: 2017-02-28 | Disposition: A | Discharge status: Home / Self Care | Payer: 59 | Source: Ambulatory Visit | Attending: General Surgery | Admitting: General Surgery

## 2017-02-28 ENCOUNTER — Ambulatory Visit (HOSPITAL_BASED_OUTPATIENT_CLINIC_OR_DEPARTMENT_OTHER)
Admission: RE | Admit: 2017-02-28 | Discharge: 2017-03-01 | Disposition: A | Discharge status: Home / Self Care | Payer: 59 | Source: Ambulatory Visit | Attending: General Surgery | Admitting: General Surgery

## 2017-02-28 ENCOUNTER — Ambulatory Visit (HOSPITAL_BASED_OUTPATIENT_CLINIC_OR_DEPARTMENT_OTHER): Payer: 59 | Admitting: Anesthesiology

## 2017-02-28 ENCOUNTER — Encounter (HOSPITAL_BASED_OUTPATIENT_CLINIC_OR_DEPARTMENT_OTHER): Payer: Self-pay | Admitting: Anesthesiology

## 2017-02-28 ENCOUNTER — Encounter (HOSPITAL_BASED_OUTPATIENT_CLINIC_OR_DEPARTMENT_OTHER)
Admission: RE | Disposition: A | Discharge status: Home / Self Care | Payer: Self-pay | Source: Ambulatory Visit | Attending: General Surgery

## 2017-02-28 DIAGNOSIS — C773 Secondary and unspecified malignant neoplasm of axilla and upper limb lymph nodes: Secondary | ICD-10-CM | POA: Diagnosis not present

## 2017-02-28 DIAGNOSIS — L732 Hidradenitis suppurativa: Secondary | ICD-10-CM | POA: Diagnosis not present

## 2017-02-28 DIAGNOSIS — C50411 Malignant neoplasm of upper-outer quadrant of right female breast: Secondary | ICD-10-CM

## 2017-02-28 DIAGNOSIS — Z882 Allergy status to sulfonamides status: Secondary | ICD-10-CM | POA: Diagnosis not present

## 2017-02-28 DIAGNOSIS — Q676 Pectus excavatum: Secondary | ICD-10-CM | POA: Diagnosis not present

## 2017-02-28 DIAGNOSIS — G8918 Other acute postprocedural pain: Secondary | ICD-10-CM | POA: Diagnosis not present

## 2017-02-28 DIAGNOSIS — Z880 Allergy status to penicillin: Secondary | ICD-10-CM | POA: Insufficient documentation

## 2017-02-28 DIAGNOSIS — N6082 Other benign mammary dysplasias of left breast: Secondary | ICD-10-CM | POA: Diagnosis not present

## 2017-02-28 DIAGNOSIS — D242 Benign neoplasm of left breast: Secondary | ICD-10-CM | POA: Diagnosis not present

## 2017-02-28 DIAGNOSIS — C50912 Malignant neoplasm of unspecified site of left female breast: Secondary | ICD-10-CM | POA: Diagnosis present

## 2017-02-28 DIAGNOSIS — C50111 Malignant neoplasm of central portion of right female breast: Secondary | ICD-10-CM | POA: Diagnosis not present

## 2017-02-28 DIAGNOSIS — E785 Hyperlipidemia, unspecified: Secondary | ICD-10-CM | POA: Diagnosis not present

## 2017-02-28 DIAGNOSIS — C50011 Malignant neoplasm of nipple and areola, right female breast: Secondary | ICD-10-CM | POA: Diagnosis not present

## 2017-02-28 HISTORY — PX: BREAST RECONSTRUCTION WITH PLACEMENT OF TISSUE EXPANDER AND FLEX HD (ACELLULAR HYDRATED DERMIS): SHX6295

## 2017-02-28 HISTORY — DX: Malignant neoplasm of unspecified site of unspecified female breast: C50.919

## 2017-02-28 HISTORY — PX: SENTINEL NODE BIOPSY: SHX6608

## 2017-02-28 HISTORY — DX: Personal history of Methicillin resistant Staphylococcus aureus infection: Z86.14

## 2017-02-28 HISTORY — PX: NIPPLE SPARING MASTECTOMY: SHX6537

## 2017-02-28 SURGERY — MASTECTOMY, NIPPLE SPARING
Anesthesia: General | Site: Breast | Laterality: Right

## 2017-02-28 MED ORDER — SUGAMMADEX SODIUM 200 MG/2ML IV SOLN
INTRAVENOUS | Status: DC | PRN
  Administered 2017-02-28: 200 mg via INTRAVENOUS

## 2017-02-28 MED ORDER — LIDOCAINE HCL (CARDIAC) 20 MG/ML IV SOLN
INTRAVENOUS | Status: DC | PRN
  Administered 2017-02-28: 50 mg via INTRAVENOUS

## 2017-02-28 MED ORDER — FENTANYL CITRATE (PF) 100 MCG/2ML IJ SOLN
INTRAMUSCULAR | Status: AC
  Filled 2017-02-28: qty 2

## 2017-02-28 MED ORDER — LIDOCAINE 2% (20 MG/ML) 5 ML SYRINGE
INTRAMUSCULAR | Status: AC
  Filled 2017-02-28: qty 5

## 2017-02-28 MED ORDER — SODIUM CHLORIDE 0.9 % IV SOLN
INTRAVENOUS | Status: DC
Start: 2017-02-28 — End: 2017-03-01
  Administered 2017-02-28: 17:00:00 via INTRAVENOUS

## 2017-02-28 MED ORDER — GABAPENTIN 300 MG PO CAPS
300.0000 mg | ORAL_CAPSULE | ORAL | Status: AC
  Administered 2017-02-28: 300 mg via ORAL

## 2017-02-28 MED ORDER — ONDANSETRON HCL 4 MG/2ML IJ SOLN
INTRAMUSCULAR | Status: AC
  Filled 2017-02-28: qty 2

## 2017-02-28 MED ORDER — MIDAZOLAM HCL 2 MG/2ML IJ SOLN
INTRAMUSCULAR | Status: AC
  Filled 2017-02-28: qty 2

## 2017-02-28 MED ORDER — CHLORHEXIDINE GLUCONATE CLOTH 2 % EX PADS
6.0000 | MEDICATED_PAD | Freq: Once | CUTANEOUS | Status: DC
Start: 2017-02-28 — End: 2017-02-28

## 2017-02-28 MED ORDER — MORPHINE SULFATE (PF) 2 MG/ML IV SOLN: 2.0000 mg | INTRAVENOUS | Status: DC | PRN

## 2017-02-28 MED ORDER — DEXAMETHASONE SODIUM PHOSPHATE 10 MG/ML IJ SOLN
INTRAMUSCULAR | Status: AC
  Filled 2017-02-28: qty 1

## 2017-02-28 MED ORDER — OXYCODONE HCL 5 MG PO TABS: 5.0000 mg | ORAL_TABLET | Freq: Once | ORAL | Status: DC | PRN

## 2017-02-28 MED ORDER — ACETAMINOPHEN 500 MG PO TABS
1000.0000 mg | ORAL_TABLET | ORAL | Status: AC
  Administered 2017-02-28: 1000 mg via ORAL

## 2017-02-28 MED ORDER — SODIUM CHLORIDE 0.9 % IV SOLN
INTRAVENOUS | Status: DC | PRN
  Administered 2017-02-28: 1000 mL

## 2017-02-28 MED ORDER — ROCURONIUM BROMIDE 10 MG/ML (PF) SYRINGE
PREFILLED_SYRINGE | INTRAVENOUS | Status: AC
  Filled 2017-02-28: qty 5

## 2017-02-28 MED ORDER — POVIDONE-IODINE 10 % EX SOLN
CUTANEOUS | Status: DC | PRN
  Administered 2017-02-28: 1 via TOPICAL

## 2017-02-28 MED ORDER — DIPHENHYDRAMINE HCL 50 MG/ML IJ SOLN
INTRAMUSCULAR | Status: AC
  Filled 2017-02-28: qty 1

## 2017-02-28 MED ORDER — CLINDAMYCIN PHOSPHATE 600 MG/50ML IV SOLN
INTRAVENOUS | Status: AC
  Filled 2017-02-28: qty 50

## 2017-02-28 MED ORDER — TECHNETIUM TC 99M SULFUR COLLOID FILTERED
1.0000 | Freq: Once | INTRAVENOUS | Status: AC | PRN
  Administered 2017-02-28: 1 via INTRADERMAL

## 2017-02-28 MED ORDER — SUFENTANIL 5MCG/ML (10ML) SYRINGE FOR MEDFUSION PUMP - OPTIME
INTRAVENOUS | Status: DC | PRN
  Administered 2017-02-28: 10 ug via INTRAVENOUS
  Administered 2017-02-28: 5 ug via INTRAVENOUS
  Administered 2017-02-28: 15 ug via INTRAVENOUS
  Administered 2017-02-28: 5 ug via INTRAVENOUS

## 2017-02-28 MED ORDER — ONDANSETRON HCL 4 MG/2ML IJ SOLN
4.0000 mg | Freq: Four times a day (QID) | INTRAMUSCULAR | Status: AC | PRN
  Administered 2017-02-28: 4 mg via INTRAVENOUS

## 2017-02-28 MED ORDER — LACTATED RINGERS IV SOLN
INTRAVENOUS | Status: DC
  Administered 2017-02-28 (×3): via INTRAVENOUS

## 2017-02-28 MED ORDER — ACETAMINOPHEN 325 MG PO TABS
650.0000 mg | ORAL_TABLET | Freq: Four times a day (QID) | ORAL | Status: DC | PRN
  Administered 2017-03-01: 650 mg via ORAL
  Filled 2017-02-28: qty 2

## 2017-02-28 MED ORDER — CHLORHEXIDINE GLUCONATE CLOTH 2 % EX PADS: 6.0000 | MEDICATED_PAD | Freq: Once | CUTANEOUS | Status: DC

## 2017-02-28 MED ORDER — KETOROLAC TROMETHAMINE 30 MG/ML IJ SOLN
INTRAMUSCULAR | Status: AC
  Filled 2017-02-28: qty 1

## 2017-02-28 MED ORDER — ONDANSETRON 4 MG PO TBDP: 4.0000 mg | ORAL_TABLET | Freq: Four times a day (QID) | ORAL | Status: DC | PRN

## 2017-02-28 MED ORDER — BUPIVACAINE-EPINEPHRINE (PF) 0.5% -1:200000 IJ SOLN
INTRAMUSCULAR | Status: DC | PRN
  Administered 2017-02-28 (×2): 20 mL via PERINEURAL

## 2017-02-28 MED ORDER — LIDOCAINE 2% (20 MG/ML) 5 ML SYRINGE
INTRAMUSCULAR | Status: AC
Start: 2017-02-28 — End: 2017-02-28
  Filled 2017-02-28: qty 5

## 2017-02-28 MED ORDER — DEXAMETHASONE SODIUM PHOSPHATE 4 MG/ML IJ SOLN
INTRAMUSCULAR | Status: DC | PRN
  Administered 2017-02-28: 10 mg via INTRAVENOUS

## 2017-02-28 MED ORDER — ACETAMINOPHEN 650 MG RE SUPP: 650.0000 mg | Freq: Four times a day (QID) | RECTAL | Status: DC | PRN

## 2017-02-28 MED ORDER — CLINDAMYCIN PHOSPHATE 600 MG/50ML IV SOLN
600.0000 mg | Freq: Three times a day (TID) | INTRAVENOUS | Status: DC
  Administered 2017-02-28 – 2017-03-01 (×2): 600 mg via INTRAVENOUS

## 2017-02-28 MED ORDER — SUFENTANIL CITRATE 50 MCG/ML IV SOLN
INTRAVENOUS | Status: AC
  Filled 2017-02-28: qty 1

## 2017-02-28 MED ORDER — OXYCODONE HCL 5 MG PO TABS
5.0000 mg | ORAL_TABLET | ORAL | Status: DC | PRN
  Administered 2017-02-28 (×2): 5 mg via ORAL
  Filled 2017-02-28 (×2): qty 1

## 2017-02-28 MED ORDER — SCOPOLAMINE 1 MG/3DAYS TD PT72
MEDICATED_PATCH | TRANSDERMAL | Status: AC
  Filled 2017-02-28: qty 1

## 2017-02-28 MED ORDER — HYDROMORPHONE HCL 1 MG/ML IJ SOLN: 0.2500 mg | INTRAMUSCULAR | Status: DC | PRN

## 2017-02-28 MED ORDER — VANCOMYCIN HCL IN DEXTROSE 1-5 GM/200ML-% IV SOLN
1000.0000 mg | INTRAVENOUS | Status: AC
  Administered 2017-02-28: 1000 mg via INTRAVENOUS

## 2017-02-28 MED ORDER — KETOROLAC TROMETHAMINE 30 MG/ML IJ SOLN
30.0000 mg | Freq: Three times a day (TID) | INTRAMUSCULAR | Status: AC
  Administered 2017-02-28 – 2017-03-01 (×2): 30 mg via INTRAVENOUS
  Filled 2017-02-28: qty 1

## 2017-02-28 MED ORDER — OXYCODONE HCL 5 MG/5ML PO SOLN: 5.0000 mg | Freq: Once | ORAL | Status: DC | PRN

## 2017-02-28 MED ORDER — GABAPENTIN 300 MG PO CAPS
ORAL_CAPSULE | ORAL | Status: AC
  Filled 2017-02-28: qty 1

## 2017-02-28 MED ORDER — 0.9 % SODIUM CHLORIDE (POUR BTL) OPTIME
TOPICAL | Status: DC | PRN
  Administered 2017-02-28: 1000 mL

## 2017-02-28 MED ORDER — PHENYLEPHRINE 40 MCG/ML (10ML) SYRINGE FOR IV PUSH (FOR BLOOD PRESSURE SUPPORT)
PREFILLED_SYRINGE | INTRAVENOUS | Status: DC | PRN
  Administered 2017-02-28: 80 ug via INTRAVENOUS
  Administered 2017-02-28: 40 ug via INTRAVENOUS

## 2017-02-28 MED ORDER — ACETAMINOPHEN 500 MG PO TABS
ORAL_TABLET | ORAL | Status: AC
  Filled 2017-02-28: qty 2

## 2017-02-28 MED ORDER — VANCOMYCIN HCL IN DEXTROSE 1-5 GM/200ML-% IV SOLN
INTRAVENOUS | Status: AC
  Filled 2017-02-28: qty 200

## 2017-02-28 MED ORDER — DIPHENHYDRAMINE HCL 50 MG/ML IJ SOLN
INTRAMUSCULAR | Status: DC | PRN
  Administered 2017-02-28: 12.5 mg via INTRAVENOUS

## 2017-02-28 MED ORDER — MIDAZOLAM HCL 2 MG/2ML IJ SOLN
1.0000 mg | INTRAMUSCULAR | Status: DC | PRN
  Administered 2017-02-28: 2 mg via INTRAVENOUS

## 2017-02-28 MED ORDER — METHOCARBAMOL 500 MG PO TABS
500.0000 mg | ORAL_TABLET | Freq: Four times a day (QID) | ORAL | Status: DC | PRN
  Administered 2017-02-28 – 2017-03-01 (×2): 500 mg via ORAL
  Filled 2017-02-28 (×2): qty 1

## 2017-02-28 MED ORDER — PROPOFOL 10 MG/ML IV BOLUS
INTRAVENOUS | Status: DC | PRN
  Administered 2017-02-28: 70 mg via INTRAVENOUS
  Administered 2017-02-28: 50 mg via INTRAVENOUS
  Administered 2017-02-28: 30 mg via INTRAVENOUS
  Administered 2017-02-28: 200 mg via INTRAVENOUS
  Administered 2017-02-28: 30 mg via INTRAVENOUS
  Administered 2017-02-28: 50 mg via INTRAVENOUS
  Administered 2017-02-28: 30 mg via INTRAVENOUS

## 2017-02-28 MED ORDER — ROCURONIUM BROMIDE 100 MG/10ML IV SOLN
INTRAVENOUS | Status: DC | PRN
  Administered 2017-02-28: 50 mg via INTRAVENOUS
  Administered 2017-02-28: 20 mg via INTRAVENOUS

## 2017-02-28 MED ORDER — SIMETHICONE 80 MG PO CHEW: 40.0000 mg | CHEWABLE_TABLET | Freq: Four times a day (QID) | ORAL | Status: DC | PRN

## 2017-02-28 MED ORDER — ONDANSETRON HCL 4 MG/2ML IJ SOLN
4.0000 mg | Freq: Four times a day (QID) | INTRAMUSCULAR | Status: DC | PRN
  Administered 2017-02-28: 4 mg via INTRAVENOUS
  Filled 2017-02-28: qty 2

## 2017-02-28 MED ORDER — SCOPOLAMINE 1 MG/3DAYS TD PT72
1.0000 | MEDICATED_PATCH | Freq: Once | TRANSDERMAL | Status: DC | PRN
  Administered 2017-02-28: 1.5 mg via TRANSDERMAL

## 2017-02-28 MED ORDER — SUGAMMADEX SODIUM 200 MG/2ML IV SOLN
INTRAVENOUS | Status: AC
  Filled 2017-02-28: qty 2

## 2017-02-28 SURGICAL SUPPLY — 100 items
ALLODERM 16X20 PERF BILATERAL (Tissue) ×8 IMPLANT
APPLIER CLIP 11 MED OPEN (CLIP) ×4
BAG DECANTER FOR FLEXI CONT (MISCELLANEOUS) ×8 IMPLANT
BENZOIN TINCTURE PRP APPL 2/3 (GAUZE/BANDAGES/DRESSINGS) IMPLANT
BINDER BREAST LRG (GAUZE/BANDAGES/DRESSINGS) IMPLANT
BINDER BREAST MEDIUM (GAUZE/BANDAGES/DRESSINGS) IMPLANT
BINDER BREAST XLRG (GAUZE/BANDAGES/DRESSINGS) ×4 IMPLANT
BINDER BREAST XXLRG (GAUZE/BANDAGES/DRESSINGS) IMPLANT
BIOPATCH RED 1 DISK 7.0 (GAUZE/BANDAGES/DRESSINGS) IMPLANT
BIOPATCH RED 1IN DISK 7.0MM (GAUZE/BANDAGES/DRESSINGS)
BLADE HEX COATED 2.75 (ELECTRODE) ×4 IMPLANT
BLADE SURG 10 STRL SS (BLADE) ×8 IMPLANT
BLADE SURG 15 STRL LF DISP TIS (BLADE) ×4 IMPLANT
BLADE SURG 15 STRL SS (BLADE) ×4
BNDG GAUZE ELAST 4 BULKY (GAUZE/BANDAGES/DRESSINGS) ×8 IMPLANT
CANISTER SUCT 1200ML W/VALVE (MISCELLANEOUS) ×8 IMPLANT
CHLORAPREP W/TINT 26ML (MISCELLANEOUS) ×8 IMPLANT
CLIP APPLIE 11 MED OPEN (CLIP) ×2 IMPLANT
CLOSURE WOUND 1/2 X4 (GAUZE/BANDAGES/DRESSINGS)
COVER BACK TABLE 60X90IN (DRAPES) ×8 IMPLANT
COVER MAYO STAND STRL (DRAPES) ×8 IMPLANT
COVER PROBE W GEL 5X96 (DRAPES) ×4 IMPLANT
DECANTER SPIKE VIAL GLASS SM (MISCELLANEOUS) IMPLANT
DERMABOND ADVANCED (GAUZE/BANDAGES/DRESSINGS)
DERMABOND ADVANCED .7 DNX12 (GAUZE/BANDAGES/DRESSINGS) IMPLANT
DRAIN CHANNEL 15F RND FF W/TCR (WOUND CARE) ×8 IMPLANT
DRAIN CHANNEL 19F RND (DRAIN) ×8 IMPLANT
DRAPE LAPAROSCOPIC ABDOMINAL (DRAPES) IMPLANT
DRAPE TOP ARMCOVERS (MISCELLANEOUS) ×4 IMPLANT
DRAPE U-SHAPE 76X120 STRL (DRAPES) ×8 IMPLANT
DRAPE UTILITY XL STRL (DRAPES) ×8 IMPLANT
DRSG PAD ABDOMINAL 8X10 ST (GAUZE/BANDAGES/DRESSINGS) ×12 IMPLANT
DRSG TEGADERM 4X10 (GAUZE/BANDAGES/DRESSINGS) ×16 IMPLANT
DRSG TEGADERM 4X4.75 (GAUZE/BANDAGES/DRESSINGS) IMPLANT
ELECT BLADE 4.0 EZ CLEAN MEGAD (MISCELLANEOUS) ×4
ELECT BLADE 6.5 .24CM SHAFT (ELECTRODE) ×4 IMPLANT
ELECT COATED BLADE 2.86 ST (ELECTRODE) ×4 IMPLANT
ELECT REM PT RETURN 9FT ADLT (ELECTROSURGICAL) ×4
ELECTRODE BLDE 4.0 EZ CLN MEGD (MISCELLANEOUS) ×2 IMPLANT
ELECTRODE REM PT RTRN 9FT ADLT (ELECTROSURGICAL) ×2 IMPLANT
EVACUATOR SILICONE 100CC (DRAIN) ×16 IMPLANT
GAUZE SPONGE 4X4 12PLY STRL (GAUZE/BANDAGES/DRESSINGS) IMPLANT
GLOVE BIOGEL PI IND STRL 7.0 (GLOVE) ×8 IMPLANT
GLOVE BIOGEL PI IND STRL 7.5 (GLOVE) ×4 IMPLANT
GLOVE BIOGEL PI INDICATOR 7.0 (GLOVE) ×8
GLOVE BIOGEL PI INDICATOR 7.5 (GLOVE) ×4
GLOVE SURG SS PI 6.0 STRL IVOR (GLOVE) ×12 IMPLANT
GLOVE SURG SS PI 6.5 STRL IVOR (GLOVE) ×4 IMPLANT
GLOVE SURG SS PI 7.0 STRL IVOR (GLOVE) ×8 IMPLANT
GOWN STRL REUS W/ TWL LRG LVL3 (GOWN DISPOSABLE) ×8 IMPLANT
GOWN STRL REUS W/TWL LRG LVL3 (GOWN DISPOSABLE) ×8
HEMOSTAT ARISTA ABSORB 3G PWDR (MISCELLANEOUS) IMPLANT
ILLUMINATOR WAVEGUIDE N/F (MISCELLANEOUS) IMPLANT
IV NS 500ML (IV SOLUTION)
IV NS 500ML BAXH (IV SOLUTION) IMPLANT
KIT FILL SYSTEM UNIVERSAL (SET/KITS/TRAYS/PACK) ×4 IMPLANT
KIT MARKER MARGIN INK (KITS) IMPLANT
LIGHT WAVEGUIDE WIDE FLAT (MISCELLANEOUS) ×4 IMPLANT
LIQUID BAND (GAUZE/BANDAGES/DRESSINGS) ×8 IMPLANT
NDL SAFETY ECLIPSE 18X1.5 (NEEDLE) IMPLANT
NEEDLE HYPO 18GX1.5 SHARP (NEEDLE)
NEEDLE HYPO 25X1 1.5 SAFETY (NEEDLE) IMPLANT
NS IRRIG 1000ML POUR BTL (IV SOLUTION) ×4 IMPLANT
PACK BASIN DAY SURGERY FS (CUSTOM PROCEDURE TRAY) ×4 IMPLANT
PENCIL BUTTON HOLSTER BLD 10FT (ELECTRODE) ×4 IMPLANT
PIN SAFETY STERILE (MISCELLANEOUS) ×4 IMPLANT
SHEET MEDIUM DRAPE 40X70 STRL (DRAPES) ×12 IMPLANT
SLEEVE SCD COMPRESS KNEE MED (MISCELLANEOUS) ×4 IMPLANT
SPONGE LAP 18X18 X RAY DECT (DISPOSABLE) ×20 IMPLANT
STAPLER VISISTAT 35W (STAPLE) ×4 IMPLANT
STRIP CLOSURE SKIN 1/2X4 (GAUZE/BANDAGES/DRESSINGS) IMPLANT
SUT CHROMIC 4 0 PS 2 18 (SUTURE) ×32 IMPLANT
SUT ETHIBOND 2-0 V-5 NEEDLE (SUTURE) IMPLANT
SUT ETHILON 2 0 FS 18 (SUTURE) ×12 IMPLANT
SUT ETHILON 3 0 PS 1 (SUTURE) IMPLANT
SUT MNCRL AB 3-0 PS2 18 (SUTURE) IMPLANT
SUT MNCRL AB 4-0 PS2 18 (SUTURE) ×8 IMPLANT
SUT MON AB 5-0 PS2 18 (SUTURE) IMPLANT
SUT PDS AB 2-0 CT2 27 (SUTURE) IMPLANT
SUT SILK 2 0 SH (SUTURE) ×12 IMPLANT
SUT SILK 3 0 PS 1 (SUTURE) IMPLANT
SUT VIC AB 0 CT1 27 (SUTURE) ×8
SUT VIC AB 0 CT1 27XBRD ANBCTR (SUTURE) ×8 IMPLANT
SUT VIC AB 3-0 SH 27 (SUTURE) ×6
SUT VIC AB 3-0 SH 27X BRD (SUTURE) ×6 IMPLANT
SUT VICRYL 4-0 PS2 18IN ABS (SUTURE) ×8 IMPLANT
SUT VLOC 180 0 24IN GS25 (SUTURE) ×8 IMPLANT
SYR 50ML LL SCALE MARK (SYRINGE) IMPLANT
SYR BULB IRRIGATION 50ML (SYRINGE) ×12 IMPLANT
SYR CONTROL 10ML LL (SYRINGE) IMPLANT
TAPE MEASURE VINYL STERILE (MISCELLANEOUS) IMPLANT
TISSUE ALLDRM 16X20 PERF BLTRL (Tissue) ×4 IMPLANT
TOWEL OR 17X24 6PK STRL BLUE (TOWEL DISPOSABLE) ×8 IMPLANT
TRAY FOLEY BAG SILVER LF 14FR (SET/KITS/TRAYS/PACK) ×4 IMPLANT
TRAY FOLEY BAG SILVER LF 16FR (SET/KITS/TRAYS/PACK) IMPLANT
TUBE CONNECTING 20'X1/4 (TUBING) ×2
TUBE CONNECTING 20X1/4 (TUBING) ×6 IMPLANT
Tissue Expander with Suture Tabs 400 cc ×7 IMPLANT
UNDERPAD 30X30 (UNDERPADS AND DIAPERS) IMPLANT
YANKAUER SUCT BULB TIP NO VENT (SUCTIONS) ×4 IMPLANT

## 2017-02-28 NOTE — Op Note (Signed)
Operative Note   DATE OF OPERATION: 5.7.18  LOCATION: [facility]-observation  SURGICAL DIVISION: Plastic Surgery  PREOPERATIVE DIAGNOSES:  1. Right breast cancer UOQ 2. History pectus excavatum  POSTOPERATIVE DIAGNOSES:  same  PROCEDURE:  1. Bilateral breast reconstruction with tissue expanders 2. Acellular dermis for breast reconstruction (Alloderm) 600 cm2.  SURGEON: Irene Limbo MD MBA  ASSISTANT: none  ANESTHESIA:  General.   EBL: 200 ml for entire procedure  COMPLICATIONS: None immediate.   INDICATIONS FOR PROCEDURE:  The patient, Katelyn Shelton, is a 41 y.o. female born on 1976/07/27, is here for immediate reconstruction with bilateral tissue expanders following nipple sparing mastectomies. Patient has pectus excavatum and has undergone prior reconstruction with silicone implant midline chest located beneath medial pectoralis muscle. Given this, prepectoral reconstruction planned.   FINDINGS: Natrelle 133FV-12-T 400 ml tissue expanders placed bilateral, initial fill volume 320 ml air. RIGHT SN [id number] LEFT SN [id number]  DESCRIPTION OF PROCEDURE:  Following induction of general anesthesia,  Foley catheter placed. The patient's operative site was prepped and draped in a sterile fashion. A time out was performed and all information was confirmed to be correct. Following completion of mastectomies, reconstruction began on left side.  The cavity was irrigated with solution containing Ancef, gentamicin, and bacitracin. Hemostasis was ensured. A 19 Fr drain was placed in subcutaneous position laterally and a 15 Fr drain placed along inframammary fold. Each secured to skin with 2-0 nylon. Cavity irrigated with Betadine. The tissue expanders were prepared on back table prior in insertion. The expander was filled with air to 320 ml. Perforated acellular dermis was draped over anterior surface expander. The ADM was then secured to itself over posterior surface of expander.  Redundant folds acellular dermis excised so that the ADM lied flat without folds over air filled expander.The expander was secured to fascia over lateral sternal border with a 0 vicryl. The prior sternal implant was left in place with pectoralis and capsule in place. The  lateral tab was also secured to pectoralis muscle with 0-vicryl. The ADM was secured to pectoralis muscle and chest wall along inferior border at inframammary fold with 0 V-lock suture. Skin closure completedwith 3-0 vicryl in fascial layer and 4-0 vicryl in dermis. Skin closure completed with 4-0 monocryl subcuticular and tissue adhesive.  I then directed my attention to right chest where similar irrigation and drain placement completed. The prepared expander with ADM secured over anterior surface was placed in right chest and tabs secured to chest wall and pectoralis muscle with 0- vicryl suture. The acellular dermis at inframammary fold was secured to chest wall with 0 V-lock suture. Skin closure completedwith 3-0 vicryl in fascial layer and 4-0 vicryl in dermis. Skin closure completed with 4-0 monocryl subcuticular and tissue adhesive. The mastectomy flaps were redraped so that NAC was symmetric from midline and sternal notch. Tegaderm dressings applied followed by breast binder.  The patient was allowed to wake from anesthesia, extubated and taken to the recovery room in satisfactory condition.   SPECIMENS: none  DRAINS: 15 and 19 Fr drain bilateral  Irene Limbo, MD [facility] Plastic & Reconstructive Surgery [phone], pin [phone]

## 2017-02-28 NOTE — H&P (Signed)
40 yof here with newly diagnosed right breast cancer. She had mass noted on exam by gyn Dr Lowe. she had mm that shows c density breasts. there is irregular mass with calcs in the right breast. there is also a 3 mm area of calcs in left retroareolar area. us shows a 4x1.6x3.5 cm mass at 10 oclock right breast. no abnl right axillary nodes are noted on us. us guided biopsy was performed of the right breast mass. 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 stereo biopsy of left calcs is fcc and this is concordant. she has no dc. she is here with her husband Dr Lawley to discuss options today   Past Surgical History (Alisha Spillers, CMA; 02/11/2017 8:09 AM) Cesarean Section - Multiple  Oral Surgery   Diagnostic Studies History (Alisha Spillers, CMA; 02/11/2017 8:09 AM) Colonoscopy  never Mammogram  1-3 years ago  Allergies (Alisha Spillers, CMA; 02/11/2017 8:10 AM) Sulfacetamide *CHEMICALS*  Penicillin G Benzathine & Proc *PENICILLINS*   Medication History (Alisha Spillers, CMA; 02/11/2017 8:10 AM) No Current Medications Medications Reconciled  Social History (Alisha Spillers, CMA; 02/11/2017 8:09 AM) Alcohol use  Occasional alcohol use. Caffeine use  Carbonated beverages, Coffee, Tea. No drug use  Tobacco use  Never smoker.  Family History (Alisha Spillers, CMA; 02/11/2017 8:09 AM) Bleeding disorder  Mother. Cancer  Mother. Hypertension  Mother. Melanoma  Mother.  Pregnancy / Birth History (Alisha Spillers, CMA; 02/11/2017 8:09 AM) Age at menarche  14 years. Contraceptive History  Oral contraceptives. Gravida  4 Irregular periods  Maternal age  26-30 Para  3  Other Problems (Alisha Spillers, CMA; 02/11/2017 8:09 AM) Breast Cancer  Heart murmur  Lump In Breast   Review of Systems (Alisha Spillers CMA; 02/11/2017 8:09 AM) General Not Present- Appetite Loss, Chills, Fatigue, Fever, Night Sweats, Weight  Gain and Weight Loss. Skin Not Present- Change in Wart/Mole, Dryness, Hives, Jaundice, New Lesions, Non-Healing Wounds, Rash and Ulcer. HEENT Not Present- Earache, Hearing Loss, Hoarseness, Nose Bleed, Oral Ulcers, Ringing in the Ears, Seasonal Allergies, Sinus Pain, Sore Throat, Visual Disturbances, Wears glasses/contact lenses and Yellow Eyes. Breast Present- Breast Mass, Breast Pain and Skin Changes. Not Present- Nipple Discharge. Cardiovascular Not Present- Chest Pain, Difficulty Breathing Lying Down, Leg Cramps, Palpitations, Rapid Heart Rate, Shortness of Breath and Swelling of Extremities. Gastrointestinal Not Present- Abdominal Pain, Bloating, Bloody Stool, Change in Bowel Habits, Chronic diarrhea, Constipation, Difficulty Swallowing, Excessive gas, Gets full quickly at meals, Hemorrhoids, Indigestion, Nausea, Rectal Pain and Vomiting. Female Genitourinary Not Present- Frequency, Nocturia, Painful Urination, Pelvic Pain and Urgency. Musculoskeletal Not Present- Back Pain, Joint Pain, Joint Stiffness, Muscle Pain, Muscle Weakness and Swelling of Extremities. Neurological Not Present- Decreased Memory, Fainting, Headaches, Numbness, Seizures, Tingling, Tremor, Trouble walking and Weakness. Psychiatric Not Present- Anxiety, Bipolar, Change in Sleep Pattern, Depression, Fearful and Frequent crying. Endocrine Not Present- Cold Intolerance, Excessive Hunger, Hair Changes, Heat Intolerance, Hot flashes and New Diabetes. Hematology Not Present- Blood Thinners, Easy Bruising, Excessive bleeding, Gland problems, HIV and Persistent Infections.  Vitals (Alisha Spillers CMA; 02/11/2017 8:10 AM) 02/11/2017 8:10 AM Weight: 123 lb Height: 60in Body Surface Area: 1.52 m Body Mass Index: 24.02 kg/m  Pulse: 102 (Regular)  BP: 104/62 (Sitting, Left Arm, Standard)  Physical Exam (Grove Defina MD; 02/11/2017 10:53 AM) General Mental Status-Alert. Orientation-Oriented X3. Chest and Lung  Exam Chest and lung exam reveals -on auscultation, normal breath sounds, no adventitious sounds and   normal vocal resonance. Breast Nipples-No Discharge. Breast Lump Right Upper Outer - Size - 4 cm (Height). Tenderness - Non Tender. Cardiovascular Cardiovascular examination reveals -normal heart sounds, regular rate and rhythm with no murmurs. Abdomen Note: soft nt/nd Lymphatic Head & Neck General Head & Neck Lymphatics: Bilateral - Description - Normal. Axillary General Axillary Region: Bilateral - Description - Normal. Note: no [place] adenopathy   Assessment & Plan Rolm Bookbinder MD; 02/11/2017 2:15 PM) BREAST CANCER OF UPPER-OUTER QUADRANT OF RIGHT FEMALE BREAST (C50.411) Bilateral nsm, right axillary sn biopsy Immediate prepec reconstruction

## 2017-02-28 NOTE — Anesthesia Procedure Notes (Signed)
Anesthesia Regional Block: Pectoralis block   Pre-Anesthetic Checklist: ,, timeout performed, Correct Patient, Correct Site, Correct Laterality, Correct Procedure, Correct Position, site marked, Risks and benefits discussed,  Surgical consent,  Pre-op evaluation,  At surgeon's request and post-op pain management  Laterality: Left and Right  Prep: chloraprep       Needles:  Injection technique: Single-shot  Needle Type: Echogenic Needle     Needle Length: 9cm  Needle Gauge: 21     Additional Needles:   Procedures: ultrasound guided,,,,,,,,  Narrative:  Start time: 02/28/2017 11:55 AM End time: 02/28/2017 12:08 PM Injection made incrementally with aspirations every 5 mL.  Performed by: Personally  Anesthesiologist: Camora Tremain  Additional Notes: Propofol sedation done with CRNA assistance.  Bilateral PECS blocks performed.  Pt tolerated the procedure well.

## 2017-02-28 NOTE — Anesthesia Preprocedure Evaluation (Signed)
Anesthesia Evaluation  Patient identified by MRN, date of birth, ID band Patient awake    Reviewed: Allergy & Precautions, H&P , NPO status , Patient's Chart, lab work & pertinent test results  Airway Mallampati: II   Neck ROM: full    Dental   Pulmonary neg pulmonary ROS,    breath sounds clear to auscultation       Cardiovascular negative cardio ROS   Rhythm:regular Rate:Normal     Neuro/Psych    GI/Hepatic   Endo/Other    Renal/GU      Musculoskeletal   Abdominal   Peds  Hematology   Anesthesia Other Findings   Reproductive/Obstetrics Breast CA                             Anesthesia Physical Anesthesia Plan  ASA: II  Anesthesia Plan: General   Post-op Pain Management: GA combined w/ Regional for post-op pain   Induction: Intravenous  Airway Management Planned: Oral ETT  Additional Equipment:   Intra-op Plan:   Post-operative Plan: Extubation in OR  Informed Consent: I have reviewed the patients History and Physical, chart, labs and discussed the procedure including the risks, benefits and alternatives for the proposed anesthesia with the patient or authorized representative who has indicated his/her understanding and acceptance.     Plan Discussed with: CRNA, Anesthesiologist and Surgeon  Anesthesia Plan Comments:         Anesthesia Quick Evaluation

## 2017-02-28 NOTE — Interval H&P Note (Signed)
History and Physical Interval Note:  02/28/2017 10:38 AM  Katelyn Shelton  has presented today for surgery, with the diagnosis of RIGHT BREAST CANCER  The various methods of treatment have been discussed with the patient and family. After consideration of risks, benefits and other options for treatment, the patient has consented to  Procedure(s): RIGHT NIPPLE SPARING MASTECTOMY WITH RIGHT SENTINEL LYMPH NODE BIOPSY, LEFT PROPHYLACTIC NIPPLE SPARING MASTECTOMY (Bilateral) BILATERAL BREAST RECONSTRUCTION WITH PLACEMENT OF TISSUE EXPANDER AND ALLODERM (Bilateral) as a surgical intervention .  The patient's history has been reviewed, patient examined, no change in status, stable for surgery.  I have reviewed the patient's chart and labs.  Questions were answered to the patient's satisfaction.     [name]

## 2017-02-28 NOTE — Progress Notes (Signed)
Assisted Dr. Hodierne with right, left, ultrasound guided, pectoralis block. Side rails up, monitors on throughout procedure. See vital signs in flow sheet. Tolerated Procedure well. °

## 2017-02-28 NOTE — Interval H&P Note (Signed)
History and Physical Interval Note:  02/28/2017 12:11 PM  Katelyn Shelton  has presented today for surgery, with the diagnosis of RIGHT BREAST CANCER  The various methods of treatment have been discussed with the patient and family. After consideration of risks, benefits and other options for treatment, the patient has consented to  Procedure(s): RIGHT NIPPLE SPARING MASTECTOMY WITH RIGHT SENTINEL LYMPH NODE BIOPSY, LEFT PROPHYLACTIC NIPPLE SPARING MASTECTOMY (Bilateral) BILATERAL BREAST RECONSTRUCTION WITH PLACEMENT OF TISSUE EXPANDER AND ALLODERM (Bilateral) as a surgical intervention .  The patient's history has been reviewed, patient examined, no change in status, stable for surgery.  I have reviewed the patient's chart and labs.  Questions were answered to the patient's satisfaction.     [name]

## 2017-02-28 NOTE — Op Note (Signed)
Preoperative diagnosis: Right breast cancer, clinical stage II Postoperative diagnosis: same as above Procedure: Left prophylactic nsm,Right nipple sparing mastectomy, right axillary sentinel node biopsy Surgeon: Dr Serita Grammes Asst: Dr Irene Limbo Anesthesia: general with bilateral pectoral block EBL: 100 cc Drains per plastic surgery Specimens: right and left breast marked short superior long lateral and double mark nipple areolar margin, right and left nipples, right axillary sn with highest count 1121, additional right retroareolar tissue, additional left retroareolar tissue marked short superior, long lateral double deep Complications none Sponge and needle count correct Dispo case turned over to plastics  Indications: This is a 29 yof with clinically node negative left breast cancer. Genetic testing is negative. oncotype on core is low. She has elected for bilateral nsm with immediate expander reconstruction. We also discussed a right axillary sn biopsy.  Procedure: After informed consent was obtained the patient was taken to the OR. She was given antibiotics and scds were in place. She had bilateral pectoralblocks. She was placed under general anesthesia without complication. She was prepped and draped in the standard sterile surgical fashion. A timeout was performed.  I made a 10cm inframammary incision about 9cm from sternum on the leftside initially. I then created a posterior flap with cautery removing the fascia from the pectoralis muscle. She has a silicone prosthetic from prior pectus defect that was used to fill this defect.  I noted a small corner of this inferiorly. This was not violated.. This was done to the parasternal region, clavicle and latissimus laterally. I then created the anterior flap.this was difficult due to a large remaining hematoma from a core biopsy on the left.  The breast was then removed in its entirety. The skin and nac were all viable.  The breast was marked as above. I did remove some additional retroareolar tissue.  I removed the nipple margin as a separate specimen. Hemostasis was obtained. I then packed this side and went to the otherside.  I then made a 11cm incision on the right side in the IM fold. This was 9cm from the sternum. I then removed the breast tissue in same fashion as above.I used scissors overyling the tumor.  I removed a separate retroareolar specimen.  I also removed the nipple specimen and sent this separately. The flaps were thinned so there was no more breast tissue. Hemostasis was obtained. I then used the neoprobe to identify sentinel nodes from the IM fold incision..  I excised this node. The highest count is as above. There was no background radioactivity.  Case was turned over to plastic surgery for completion.

## 2017-02-28 NOTE — Anesthesia Procedure Notes (Signed)
Procedure Name: Intubation Date/Time: 02/28/2017 12:34 PM Performed by: Lieutenant Diego Pre-anesthesia Checklist: Patient identified, Emergency Drugs available, Suction available and Patient being monitored Patient Re-evaluated:Patient Re-evaluated prior to inductionOxygen Delivery Method: Circle system utilized Preoxygenation: Pre-oxygenation with 100% oxygen Intubation Type: IV induction Ventilation: Mask ventilation without difficulty Laryngoscope Size: Miller and 3 Grade View: Grade I Tube type: Oral Tube size: 7.0 mm Number of attempts: 1 Airway Equipment and Method: Stylet and Oral airway Placement Confirmation: ETT inserted through vocal cords under direct vision,  positive ETCO2 and breath sounds checked- equal and bilateral Secured at: 21 cm Tube secured with: Tape Dental Injury: Teeth and Oropharynx as per pre-operative assessment

## 2017-03-01 ENCOUNTER — Encounter (HOSPITAL_BASED_OUTPATIENT_CLINIC_OR_DEPARTMENT_OTHER): Payer: Self-pay | Admitting: Anesthesiology

## 2017-03-01 ENCOUNTER — Ambulatory Visit: Payer: 59 | Admitting: Licensed Clinical Social Worker

## 2017-03-01 DIAGNOSIS — Z880 Allergy status to penicillin: Secondary | ICD-10-CM | POA: Diagnosis not present

## 2017-03-01 DIAGNOSIS — C50411 Malignant neoplasm of upper-outer quadrant of right female breast: Secondary | ICD-10-CM | POA: Diagnosis not present

## 2017-03-01 DIAGNOSIS — C50911 Malignant neoplasm of unspecified site of right female breast: Secondary | ICD-10-CM | POA: Diagnosis not present

## 2017-03-01 DIAGNOSIS — Q676 Pectus excavatum: Secondary | ICD-10-CM | POA: Diagnosis not present

## 2017-03-01 DIAGNOSIS — Z882 Allergy status to sulfonamides status: Secondary | ICD-10-CM | POA: Diagnosis not present

## 2017-03-01 DIAGNOSIS — Z9012 Acquired absence of left breast and nipple: Secondary | ICD-10-CM | POA: Diagnosis not present

## 2017-03-01 LAB — POCT HEMOGLOBIN-HEMACUE: Hemoglobin: 14.8 g/dL (ref 12.0–15.0)

## 2017-03-01 MED ORDER — DOXYCYCLINE HYCLATE 50 MG PO CAPS: 50.0000 mg | ORAL_CAPSULE | Freq: Two times a day (BID) | ORAL | 0 refills | Status: DC

## 2017-03-01 MED ORDER — METHOCARBAMOL 500 MG PO TABS: 500.0000 mg | ORAL_TABLET | Freq: Three times a day (TID) | ORAL | 0 refills | Status: DC | PRN

## 2017-03-01 MED ORDER — OXYCODONE HCL 5 MG PO TABS: 5.0000 mg | ORAL_TABLET | ORAL | 0 refills | Status: DC | PRN

## 2017-03-01 MED ORDER — ONDANSETRON 4 MG PO TBDP: 4.0000 mg | ORAL_TABLET | Freq: Four times a day (QID) | ORAL | 0 refills | Status: DC | PRN

## 2017-03-01 MED ORDER — CLINDAMYCIN PHOSPHATE 600 MG/50ML IV SOLN
INTRAVENOUS | Status: AC
  Filled 2017-03-01: qty 50

## 2017-03-01 MED FILL — DOXYCYCLINE HYC 50 MG CAP: 50 | 6 days supply | Qty: 12 | Fill #0

## 2017-03-01 MED FILL — ONDANSETRON ODT 4 MG TABLET: 4 | 5 days supply | Qty: 20 | Fill #0

## 2017-03-01 MED FILL — METHOCARBAMOL 500 MG TABLET: 500 | 10 days supply | Qty: 30 | Fill #0

## 2017-03-01 MED FILL — oxyCODONE HCL 5 MG TABS: 5 | 4 days supply | Qty: 40 | Fill #0

## 2017-03-01 NOTE — Progress Notes (Signed)
Patient ID: Katelyn Shelton, female   DOB: 08-Mar-1976, 41 y.o.   MRN: [id number] Doing well, dc home today, path pending

## 2017-03-01 NOTE — Anesthesia Postprocedure Evaluation (Signed)
Anesthesia Post Note  Patient: Katelyn Shelton  Procedure(s) Performed: Procedure(s) (LRB): RIGHT NIPPLE SPARING MASTECTOMY; LEFT PROPHYLACTIC NIPPLE SPARING MASTECTOMY (Bilateral) BILATERAL BREAST RECONSTRUCTION WITH PLACEMENT OF TISSUE EXPANDER AND ALLODERM (Bilateral) RIGHT AXILLARY SENTINEL LYMPH  NODE BIOPSY (Right)  Patient location during evaluation: PACU Anesthesia Type: General Level of consciousness: awake and alert and patient cooperative Pain management: pain level controlled Vital Signs Assessment: post-procedure vital signs reviewed and stable Respiratory status: spontaneous breathing and respiratory function stable Cardiovascular status: stable Anesthetic complications: no        Last Vitals:  Vitals:   03/01/17 0615 03/01/17 0815  BP: (!) 95/55 (!) 89/40  Pulse: 70 70  Resp: 14 18  Temp: 36.2 C     Last Pain:  Vitals:   03/01/17 0815  TempSrc:   PainSc: 2    Pain Goal: Patients Stated Pain Goal: 3 (02/28/17 1149)               [place]

## 2017-03-01 NOTE — Transfer of Care (Signed)
Immediate Anesthesia Transfer of Care Note  Patient: Katelyn Shelton  Procedure(s) Performed: Procedure(s): RIGHT NIPPLE SPARING MASTECTOMY; LEFT PROPHYLACTIC NIPPLE SPARING MASTECTOMY (Bilateral) BILATERAL BREAST RECONSTRUCTION WITH PLACEMENT OF TISSUE EXPANDER AND ALLODERM (Bilateral) RIGHT AXILLARY SENTINEL LYMPH  NODE BIOPSY (Right)  Patient Location: PACU  Anesthesia Type:General  Level of Consciousness: awake and alert   Airway & Oxygen Therapy: Patient Spontanous Breathing and Patient connected to face mask oxygen  Post-op Assessment: Report given to RN and Post -op Vital signs reviewed and stable  Post vital signs: Reviewed and stable  Last Vitals:  Vitals:   02/28/17 2200 03/01/17 0615  BP: (!) 88/47 (!) 95/55  Pulse: 72 70  Resp: 18 14  Temp: 36.3 C 36.2 C    Last Pain:  Vitals:   03/01/17 0645  TempSrc:   PainSc: 4       Patients Stated Pain Goal: 3 (97/02/63 7858)  Complications: No apparent anesthesia complications

## 2017-03-01 NOTE — Discharge Summary (Signed)
Physician Discharge Summary  Patient ID: Katelyn Shelton MRN: [id number] DOB/AGE: [birth year] 41 y.o.  Admit date: 02/28/2017 Discharge date: 03/01/2017  Admission Diagnoses: Right breast ca upper outer quadrant  Discharge Diagnoses:  Active Problems:   Breast cancer of upper-outer quadrant of right female breast [facility])  Discharged Condition: stable  Hospital Course: Post operatively patient did well tolerating diet and oral pain medication. Pain medication caused some nausea and will be discharged with Zofran.   Treatments: surgery: bilateral nipple sparing mastectomies, right sentinel node, bilateral breast reconstruction with tissue expander and acellular dermis  Discharge Exam: Blood pressure (!) 95/55, pulse 70, temperature 97.2 F (36.2 C), resp. rate 14, height 5' (1.524 m), weight 55.8 kg (123 lb), last menstrual period 02/15/2017, SpO2 99 %. Incision/Wound: chest soft drains serosanguinous Tegaderms in place, evolving ecchymoses without hematoma  Disposition: 01-Home or Self Care  Discharge Instructions    Call MD for:  redness, tenderness, or signs of infection (pain, swelling, bleeding, redness, odor or green/yellow discharge around incision site)    Complete by:  As directed    Call MD for:  temperature >100.5    Complete by:  As directed    Discharge instructions    Complete by:  As directed    Ok to remove dressings and shower 5.9.18. Soap and water ok, pat Tegaderms dry. Leave Tegaderms in place. No creams or ointments over incisions. Do not let drains dangle in shower, attach to lanyard or similar.Strip and record drains twice daily and bring log to clinic visit.  Breast binder or soft compression bra all other times.  Ok to raise arms above shoulders for bathing and dressing.  No house yard work or exercise until cleared by MD.   Driving Restrictions    Complete by:  As directed    No driving while taking narcotics   Lifting restrictions    Complete by:  As  directed    No lifting greater than 5 lbs   Resume previous diet    Complete by:  As directed       Follow-up Information    Irene Limbo, MD In 1 week.   Specialty:  Plastic Surgery Why:  as scheduled Contact information: [place] [address] [phone]        Rolm Bookbinder, MD. Schedule an appointment as soon as possible for a visit in 2 weeks.   Specialty:  General Surgery Contact information: [place] [address] [phone]           Signed: Irene Limbo 03/01/2017, 7:21 AM

## 2017-03-02 ENCOUNTER — Encounter (HOSPITAL_BASED_OUTPATIENT_CLINIC_OR_DEPARTMENT_OTHER): Payer: Self-pay | Admitting: General Surgery

## 2017-03-02 ENCOUNTER — Telehealth: Payer: Self-pay | Admitting: *Deleted

## 2017-03-02 NOTE — Telephone Encounter (Signed)
Ordered mammaprint per Dr. Jana Hakim.  Faxed requisition to pathology and confirmed receipt with [facility]

## 2017-03-03 ENCOUNTER — Other Ambulatory Visit: Payer: Self-pay | Admitting: General Surgery

## 2017-03-08 ENCOUNTER — Telehealth: Payer: Self-pay | Admitting: *Deleted

## 2017-03-08 NOTE — Telephone Encounter (Signed)
This RN received call from pt's father - Dr Carolyne Fiscal, stating concerns relating to " Katelyn Shelton's anxiety and that her surgery shows microscopic tumor invasion in her lymph node"  " I think it would be beneficial if she could have a scan to evaluate for her cancer diagnosis so she could have a reference regarding her diagnosis."  " I think this should be covered by her insurance since she has a positive lymph node"  When this RN inquired about follow up call - Dr Carolyne Fiscal stated best to call Katelyn Shelton with request to " tell her this is routine for her diagnosis "  This RN relayed above concern to MD - who states concerns will be discussed at visit this Friday at scheduled visit.  Noted Dawn Engineer, site is in communication with pt - informed her of above discussion as well.

## 2017-03-09 ENCOUNTER — Encounter (HOSPITAL_BASED_OUTPATIENT_CLINIC_OR_DEPARTMENT_OTHER): Payer: Self-pay | Admitting: *Deleted

## 2017-03-09 ENCOUNTER — Encounter (HOSPITAL_COMMUNITY): Payer: Self-pay

## 2017-03-10 ENCOUNTER — Other Ambulatory Visit: Payer: Self-pay | Admitting: Oncology

## 2017-03-10 ENCOUNTER — Telehealth: Payer: Self-pay | Admitting: *Deleted

## 2017-03-10 NOTE — Telephone Encounter (Signed)
Received Mammaprint score of Low Risk.  Placed a copy in Dr. Virgie Dad and [employer].  Took a copy to HIM to scan.

## 2017-03-10 NOTE — Progress Notes (Signed)
Location of Breast Cancer:Right Breast Uper Outer Quadrant   Histology per Pathology Report: Diagnosis 02/08/2017: Dr. Irene Limbo MD,  Breast, right, needle core biopsy, 10 o'clock - INVASIVE DUCTAL CARCINOMA. - DUCTAL CARCINOMA IN SITU WITH NECROSIS AND CALCIFICATIONS. Diagnosis 02/09/17: Breast, left, needle core biopsy, upper outer quadrant - FIBROCYSTIC CHANGES WITH MICROCALCIFICATIONS. - PSEUDOANGIOMATOUS STROMAL HYPERPLASIA ([place]). - THERE IS NO EVIDENCE OF MALIGNANCY  Receptor Status: ER(, 90%+) PR ( 90%+), Her2-neu (neg), Ki-67(5%)  Did patient present with symptoms (if so, please note symptoms) or was this found on screening mammography?: They found it on a manual exam at a well check, She was then sent for a mammogram.  Past/Anticipated interventions by surgeon, if any Diagnosis:02/28/2017 1. Breast, simple mastectomy, Left Nipple Sparing - INTRADUCTAL PAPILLOMA WITH USUAL DUCTAL HYPERPLASIA. - FIBROCYSTIC CHANGES WITH CALCIFICATIONS. - HEALING BIOPSY SITE. - THERE IS NO EVIDENCE OF MALIGNANCY. 2. Breast, excision, Left Retroareolar - FIBROCYSTIC CHANGES. - THERE IS NO EVIDENCE OF MALIGNANCY. 3. Nipple Biopsy, Left - BENIGN LACTIFEROUS DUCTS. - THERE IS NO EVIDENCE OF MALIGNANCY. 4. Breast, simple mastectomy, Right Nipple Sparing - INVASIVE DUCTAL CARCINOMA, GRADE I/III, SPANNING AT LEAST 4.0 CM. - DUCTAL CARCINOMA IN SITU WITH CALCIFICATIONS, INTERMEDIATE GRADE. - PERINEURAL INVASION IS IDENTIFIED. - LYMPHOVASCULAR INVASION IS IDENTIFIED. - INVASIVE AND IN SITU DUCTAL CARCINOMA ARE BROADLY LESS THAN 0.1 CM TO THE ANTERIOR MARGIN OF SPECIMEN #4. - SEE ONCOLOGY TABLE BELOW. 5. Breast, excision, Right Retroareolar - INVASIVE DUCTAL CARCINOMA, GRADE I/III, SPANNING AT LEAST 0.9 CM. - DUCTAL CARCINOMA IN SITU, INTERMEDIATE GRADE. - LYMPHOVASCULAR INVASION IS IDENTIFIED. - INVASIVE AND IN SITU DUCTAL CARCINOMA ARE BROADLY LESS THAN 0.1 CM TO THE ANTERIOR MARGIN  OF SPECIMEN #5. 6. Nipple Biopsy, Right - INVASIVE DUCTAL CARCINOMA, GRADE I/III, SPANNING 0.1 CM. - LYMPHOVASCULAR INVASION IS IDENTIFIED. 7. Lymph node, sentinel, biopsy, Right Axillary - METASTATIC CARCINOMA IN 1 OF 1 LYMPH NODE (1/1).  Past/Anticipated interventions by medical oncology, if any: Chemotherapy : Dr. Jana Hakim, MD , he has prescribed Tamoxifen  Lymphedema issues, if any: She denies. She has difficulty raising her Right arm.   Pain issues, if any:  She denies pain at this time. She will have increased pain at times and at night  SAFETY ISSUES: NO  Prior radiation?  NO  Pacemaker/ICD? NO  Possible current pregnancy? No, she denies. Her last period was the end of March. Her husband has had a vasectomy.  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, television/film/video ,  menarche age 68, G57P3 3 c-sections, 1st live birth age 36.,no tobacco use, no alcohol or drug use      paternal great aunt breast cancer, female first cousin maternal  Of patient's mother with breast cancer,   BP 114/60   Pulse 92   Temp 98.8 F (37.1 C)   Ht 5' (1.524 m)   Wt 119 lb 3.2 oz (54.1 kg)   LMP 02/15/2017 (Approximate) Comment: husband had vasectomy  SpO2 99% Comment: room air  BMI 23.28 kg/m    Wt Readings from Last 3 Encounters:  03/11/17 119 lb 3.2 oz (54.1 kg)  02/28/17 123 lb (55.8 kg)  02/11/17 123 lb 4.8 oz (55.9 kg)   Rebecca Eaton, RN 03/10/2017,5:50 PM

## 2017-03-10 NOTE — Telephone Encounter (Signed)
Spoke with patient to give her results of the low risk mammaprint.  She is anxious about radiation and would prefer Dr. Lisbeth Renshaw.  Appointment made for 03/11/17 at 1230pm with Dr. Lisbeth Renshaw.  Patient aware.

## 2017-03-11 ENCOUNTER — Ambulatory Visit: Payer: Self-pay | Admitting: Oncology

## 2017-03-11 ENCOUNTER — Ambulatory Visit
Admission: RE | Admit: 2017-03-11 | Discharge: 2017-03-11 | Disposition: A | Discharge status: Home / Self Care | Payer: 59 | Source: Ambulatory Visit | Attending: Radiation Oncology | Admitting: Radiation Oncology

## 2017-03-11 ENCOUNTER — Encounter: Payer: Self-pay | Admitting: Radiation Oncology

## 2017-03-11 VITALS — BP 114/60 | HR 92 | Temp 98.8°F | Ht 60.0 in | Wt 119.2 lb

## 2017-03-11 DIAGNOSIS — Z8614 Personal history of Methicillin resistant Staphylococcus aureus infection: Secondary | ICD-10-CM | POA: Insufficient documentation

## 2017-03-11 DIAGNOSIS — F329 Major depressive disorder, single episode, unspecified: Secondary | ICD-10-CM | POA: Insufficient documentation

## 2017-03-11 DIAGNOSIS — Z170 Estrogen receptor positive status [ER+]: Secondary | ICD-10-CM | POA: Diagnosis not present

## 2017-03-11 DIAGNOSIS — M79621 Pain in right upper arm: Secondary | ICD-10-CM | POA: Diagnosis not present

## 2017-03-11 DIAGNOSIS — C50411 Malignant neoplasm of upper-outer quadrant of right female breast: Secondary | ICD-10-CM | POA: Insufficient documentation

## 2017-03-11 DIAGNOSIS — F419 Anxiety disorder, unspecified: Secondary | ICD-10-CM | POA: Diagnosis not present

## 2017-03-11 DIAGNOSIS — Z880 Allergy status to penicillin: Secondary | ICD-10-CM | POA: Diagnosis not present

## 2017-03-11 DIAGNOSIS — Z9013 Acquired absence of bilateral breasts and nipples: Secondary | ICD-10-CM | POA: Insufficient documentation

## 2017-03-11 DIAGNOSIS — C50911 Malignant neoplasm of unspecified site of right female breast: Secondary | ICD-10-CM | POA: Diagnosis not present

## 2017-03-11 DIAGNOSIS — D242 Benign neoplasm of left breast: Secondary | ICD-10-CM | POA: Diagnosis not present

## 2017-03-11 NOTE — Progress Notes (Signed)
Radiation Oncology         [phone] ________________________________  Name: Katelyn Shelton MRN: [id number]  Date: 03/11/2017  DOB: Jul 02, 1976  CC:Chipper Herb, MD  Magrinat, Virgie Dad, MD     REFERRING PHYSICIAN: Magrinat, Virgie Dad, MD   DIAGNOSIS: The encounter diagnosis was Malignant neoplasm of upper-outer quadrant of right breast in female, estrogen receptor positive ([place]).   HISTORY OF PRESENT ILLNESS: Katelyn Shelton is a 41 y.o. female seen at the request of Dr. Donne Hazel for a newly diagnosed right breast cancer. [name] was found to have a palpable mass in the right upper outer quadrant of the right breast and was sent for diagnostic mammogram in 02/08/17. This revealed a 3 mm group of heterogenous calcifications in the retroareolar left breast and scattered left breast calcifications and an irregular mass in the upper outer right breast. An ultrasound revealed a 4 x 1.6 x 3.5 cm mass in the right breast corresponding to the mammography finding. No adenopathy was noted in the axilla. She had a biopsy of the right breast revealing grade 2 invasive ductal carcinoma and DCIS with calcifications and necrosis, ER/PR positive, HER2 negative, Ki67 was 5%. The left breast calcifications were biopsied as well and revealed PASH and fibrocystic changes. An MRI of the breast was pursued for surgical planning given prior chest wall surgery and on 02/16/17 revealed the right upper outer quadrant mass at 2.1 x 3.5 x 3.5 cm with two satellite nodules each measuring 5 mm with similar enhancement. The left breast did not have any enhancement and no suspicious appearing adenopathy was noted. Her retropectoral suprasternal midline implant from prior surgery was noted. She elected to undergo bilateral mastectomies with right sentinel node assessment and bilateral reconstruction with tissue expanders. Final pathology revealed no malignancy in the left breast, retroareolar tissue, or nipple. The right breast  revealed a grade 2, 4 cm tumor with IDC, DCIS, and LVSI and perineural invasion her sentinel node which was removed was positive for metastatic disease, and her right nipple biopsy was also positive for 1 mm of IDC with LVSI.  She is going back to the OR next Tuesday for resection of the right nipple. Mammaprint was also performed and revealed low risk features. She does not have an indication at this time for chemotherapy. She has undergone genetic testing which was negative as well. She comes today to discuss options for radiotherapy in the adjuvant setting.   PREVIOUS RADIATION THERAPY: No   PAST MEDICAL HISTORY:  Past Medical History:  Diagnosis Date  . Anxiety   . Breast cancer ([place]) 01/2017   right; genetic testing negative in 2018 with [facility] panel  . Depression   . History of MRSA infection    elbow       PAST SURGICAL HISTORY: Past Surgical History:  Procedure Laterality Date  . BREAST RECONSTRUCTION WITH PLACEMENT OF TISSUE EXPANDER AND FLEX HD (ACELLULAR HYDRATED DERMIS) Bilateral 02/28/2017   Procedure: BILATERAL BREAST RECONSTRUCTION WITH PLACEMENT OF TISSUE EXPANDER AND ALLODERM;  Surgeon: Irene Limbo, MD;  Location: [facility];  Service: Plastics;  Laterality: Bilateral;  . CESAREAN SECTION     x 2 (3 total)  . CESAREAN SECTION  02/04/2012   Procedure: CESAREAN SECTION;  Surgeon: Cyril Mourning, MD;  Location: [facility];  Service: Gynecology;  Laterality: N/A;  Repeat Cesarean Section Delivery  Boy  @  [phone], Apgars 9/10  . DILATION AND CURETTAGE OF UTERUS    . NIPPLE  SPARING MASTECTOMY Bilateral 02/28/2017   Procedure: RIGHT NIPPLE SPARING MASTECTOMY; LEFT PROPHYLACTIC NIPPLE SPARING MASTECTOMY;  Surgeon: Rolm Bookbinder, MD;  Location: [facility];  Service: General;  Laterality: Bilateral;  . PECTUS EXCAVATUM REPAIR  1996  . SENTINEL NODE BIOPSY Right 02/28/2017   Procedure: RIGHT AXILLARY SENTINEL LYMPH  NODE BIOPSY;  Surgeon:  Rolm Bookbinder, MD;  Location: [facility];  Service: General;  Laterality: Right;  . VULVAR LESION REMOVAL    . WISDOM TOOTH EXTRACTION       FAMILY HISTORY:  Family History  Problem Relation Age of Onset  . Skin cancer Mother   . Parkinson's disease Sister 62  . Diabetes Maternal Aunt   . Heart failure Maternal Grandmother   . Heart attack Maternal Grandfather   . Stomach cancer Paternal Grandmother   . Prostate cancer Paternal Grandfather   . Alzheimer's disease Paternal Grandfather   . Breast cancer Cousin        mother's paternal first FEMALE cousin     SOCIAL HISTORY:  reports that she has never smoked. She has never used smokeless tobacco. She reports that she does not drink alcohol or use drugs. The patient is married and lives in [place]. Her husband is a Arts administrator. 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    ALLERGIES: Peach flavor; Fentanyl; Penicillins; Sulfa antibiotics; and Latex   MEDICATIONS:  Current Outpatient Prescriptions  Medication Sig Dispense Refill  . acetaminophen (TYLENOL) 325 MG tablet Take 650 mg by mouth every 6 (six) hours as needed.    [name] ibuprofen (ADVIL,MOTRIN) 200 MG tablet Take 200 mg by mouth every 6 (six) hours as needed.    [name] LORazepam (ATIVAN) 0.5 MG tablet Take 0.5 mg by mouth.    . methocarbamol (ROBAXIN) 500 MG tablet Take 500 mg by mouth.    . oxyCODONE (OXY IR/ROXICODONE) 5 MG immediate release tablet Take 1-2 tablets (5-10 mg total) by mouth every 4 (four) hours as needed for moderate pain. 40 tablet 0  . tamoxifen (NOLVADEX) 20 MG tablet Take 1 tablet (20 mg total) by mouth daily. 90 tablet 12  . doxycycline (VIBRAMYCIN) 50 MG capsule Take 50 mg by mouth.    . fluticasone (FLONASE) 50 MCG/ACT nasal spray Place 1 spray into both nostrils daily as needed.    . ondansetron (ZOFRAN-ODT) 4 MG disintegrating tablet Take 1 tablet (4 mg total) by mouth every 6 (six) hours as needed for nausea.  (Patient not taking: Reported on 03/11/2017) 20 tablet 0   No current facility-administered medications for this encounter.      REVIEW OF SYSTEMS: On review of systems, the patient reports that she is doing well overall. She states she's healing well and denies any concerns with her incisions or progress thus far.   PHYSICAL EXAM:  Wt Readings from Last 3 Encounters:  03/11/17 119 lb 3.2 oz (54.1 kg)  02/28/17 123 lb (55.8 kg)  02/11/17 123 lb 4.8 oz (55.9 kg)   Temp Readings from Last 3 Encounters:  03/11/17 98.8 F (37.1 C)  03/01/17 97.2 F (36.2 C)  02/11/17 98.2 F (36.8 C) (Oral)   BP Readings from Last 3 Encounters:  03/11/17 114/60  03/01/17 (!) 89/40  02/11/17 115/66   Pulse Readings from Last 3 Encounters:  03/11/17 92  03/01/17 70  02/11/17 63   Pain Assessment Pain Score: 1  Pain Loc: Breast (right)/10  In general this is a well appearing Caucasian female in  no acute distress. She is alert and oriented x4 and appropriate throughout the examination. HEENT reveals that the patient is normocephalic, atraumatic. EOMs are intact. PERRLA. Skin is intact without any evidence of gross lesions. Cardiopulmonary assessment is negative for acute distress and she exhibits normal effort.     ECOG = 1  0 - Asymptomatic (Fully active, able to carry on all predisease activities without restriction)  1 - Symptomatic but completely ambulatory (Restricted in physically strenuous activity but ambulatory and able to carry out work of a light or sedentary nature. For example, light housework, office work)  2 - Symptomatic, <50% in bed during the day (Ambulatory and capable of all self care but unable to carry out any work activities. Up and about more than 50% of waking hours)  3 - Symptomatic, >50% in bed, but not bedbound (Capable of only limited self-care, confined to bed or chair 50% or more of waking hours)  4 - Bedbound (Completely disabled. Cannot carry on any self-care.  Totally confined to bed or chair)  5 - Death   Eustace Pen MM, Creech RH, Tormey DC, et al. [phone]). "Toxicity and response criteria of the [facility]". [place] Oncol. 5 (6): 649-55    LABORATORY DATA:  Lab Results  Component Value Date   WBC 5.9 07/12/2014   HGB 14.8 02/28/2017   HCT 40.8 07/12/2014   MCV 93.2 07/12/2014   PLT 202 12/21/2013   Lab Results  Component Value Date   NA 136 07/12/2014   K 4.2 07/12/2014   CL 99 07/12/2014   CO2 20 07/12/2014   Lab Results  Component Value Date   ALT 17 07/12/2014   AST 21 07/12/2014   ALKPHOS 40 07/12/2014   BILITOT 0.8 07/12/2014      RADIOGRAPHY: Mr Breast Bilateral W Wo Contrast  Result Date: 02/16/2017 CLINICAL DATA:  Recently diagnosed with invasive ductal carcinoma and ductal carcinoma in situ following ultrasound-guided core biopsy of a 4 cm mass the 10 o'clock location of the right breast. Patient has history of reconstruction for pectus excavatum. Surgical planning prior to possible bilateral mastectomy. LABS:  Not applicable EXAM: BILATERAL BREAST MRI WITH AND WITHOUT CONTRAST TECHNIQUE: Multiplanar, multisequence MR images of both breasts were obtained prior to and following the intravenous administration of 11 ml of MultiHance. THREE-DIMENSIONAL MR IMAGE RENDERING ON INDEPENDENT WORKSTATION: Three-dimensional MR images were rendered by post-processing of the original MR data on an independent workstation. The three-dimensional MR images were interpreted, and findings are reported in the following complete MRI report for this study. Three dimensional images were evaluated at the independent DynaCad workstation COMPARISON:  02/09/2017 and 02/08/2017 FINDINGS: Breast composition: c. Heterogeneous fibroglandular tissue. Background parenchymal enhancement: Moderate Right breast: Within the upper-outer quadrant of the right breast there is an enhancing irregular mass with spiculated margins. Mass demonstrates  rapid wash-in and washout type kinetics and measures 2.1 x 3.5 x 3.5 cm within the mass there is tissue marker clip following recent ultrasound-guided core biopsy. 7 mm anterior to the lower aspect aspect of the mass there is a 5 mm satellite nodule with similar enhancement characteristics. 7 mm medial to upper aspect aspect of the mass there is a 5 mm satellite nodule with similar enhancement characteristics. Left breast: Post biopsy changes are identified in the anterior upper central portion of the left breast following recent stereotactic guided core biopsy demonstrating fibrocystic changes. There is also associated tissue marker clip artifact in this region. No suspicious enhancement  is identified in the left breast. Lymph nodes: No abnormal appearing lymph nodes. Ancillary findings: A retropectoral suprasternal midline implant measures 14.0 (left-to-right) x 10.0 (craniocaudal) x 1.5 (anterior-posterior) cm. IMPRESSION: 1. Enhancing mass in the upper-outer quadrant of the right breast associated with satellite nodules, compatible with the known malignancy. 2. No suspicious findings in the left breast ; post biopsy changes are present. 3. Retropectoral midline implant for pectus excavatum. RECOMMENDATION: Treatment plan is recommended. BI-RADS CATEGORY  6: Known biopsy-proven malignancy. Electronically Signed   By: Nolon Nations M.D.   On: 02/16/2017 17:38       IMPRESSION/PLAN: 1. Stage IIA, T2N1Mx, grade 2, ER/PR positive invasive ductal carcinoma of the right breast. Dr. Lisbeth Renshaw discusses the pathology findings and reviews the nature of invasive breast disease. He discusses the findings from the patient's recent surgery and reviews the pathology results as well as the rationale for adjuvant radiotherpay. Dr. Lisbeth Renshaw would recommend radiotherapy to the chest wall and regional lymph nodes. We discussed the risks, benefits, short, and long term effects of radiotherapy, and the patient is interested in  proceeding. Dr. Lisbeth Renshaw discusses the delivery and logistics of radiotherapy, and would recommend a course of 6 1/2 weeks. We will plan to see her about 2-3 weeks following her upcoming resection next Tuesday, and plan to begin radiotherapy the last week of June 2018. She has an upcoming trip that she would like to still go on, and she would need to leave on 05/29/17. We will try to accommodate for her treatment to complete prior to her trip. 2. Right axillary tightness. The patient is concerned about development of lymphedema as well as some tightness of the right axilla. We will refer her to physical therapy.  In a visit lasting 60 minutes, greater than 50% of the time was spent face to face discussing options of radiotherapy, and coordinating the patient's care.   The above documentation reflects my direct findings during this shared patient visit. Please see the separate note by Dr. Lisbeth Renshaw on this date for the remainder of the patient's plan of care.    Carola Rhine, PAC

## 2017-03-14 ENCOUNTER — Telehealth: Payer: Self-pay | Admitting: *Deleted

## 2017-03-14 NOTE — Telephone Encounter (Signed)
Returned call  Left vm and transferred call to [facility], to make appt per patient request to be seen week of June 4th,  She will be in town that week, knows to start radiation end of June,  2:06 PM

## 2017-03-14 NOTE — Telephone Encounter (Signed)
Spoke with patient with some concerns about her radiation schedule.  She would like her planning to be 6/7 or 6/8 because she is going out of town 6/10/-6/14.  She would like to start radiation 6/18 or 6/19.  I informed her that I would let Dr. Lisbeth Renshaw and Ebony Hail, NP know about the dates to try to accommodate.  She wants to be finished by the 1st of August because she has a trip planned for 8/5.

## 2017-03-15 ENCOUNTER — Ambulatory Visit (HOSPITAL_BASED_OUTPATIENT_CLINIC_OR_DEPARTMENT_OTHER): Payer: 59 | Admitting: Anesthesiology

## 2017-03-15 ENCOUNTER — Encounter (HOSPITAL_BASED_OUTPATIENT_CLINIC_OR_DEPARTMENT_OTHER): Payer: Self-pay | Admitting: Anesthesiology

## 2017-03-15 ENCOUNTER — Encounter (HOSPITAL_BASED_OUTPATIENT_CLINIC_OR_DEPARTMENT_OTHER)
Admission: RE | Disposition: A | Discharge status: Home / Self Care | Payer: Self-pay | Source: Ambulatory Visit | Attending: General Surgery

## 2017-03-15 ENCOUNTER — Ambulatory Visit (HOSPITAL_BASED_OUTPATIENT_CLINIC_OR_DEPARTMENT_OTHER)
Admission: RE | Admit: 2017-03-15 | Discharge: 2017-03-15 | Disposition: A | Discharge status: Home / Self Care | Payer: 59 | Source: Ambulatory Visit | Attending: General Surgery | Admitting: General Surgery

## 2017-03-15 DIAGNOSIS — F418 Other specified anxiety disorders: Secondary | ICD-10-CM | POA: Insufficient documentation

## 2017-03-15 DIAGNOSIS — Z45811 Encounter for adjustment or removal of right breast implant: Secondary | ICD-10-CM | POA: Diagnosis not present

## 2017-03-15 DIAGNOSIS — C50411 Malignant neoplasm of upper-outer quadrant of right female breast: Secondary | ICD-10-CM | POA: Insufficient documentation

## 2017-03-15 DIAGNOSIS — Z809 Family history of malignant neoplasm, unspecified: Secondary | ICD-10-CM | POA: Diagnosis not present

## 2017-03-15 DIAGNOSIS — Z853 Personal history of malignant neoplasm of breast: Secondary | ICD-10-CM | POA: Diagnosis not present

## 2017-03-15 DIAGNOSIS — E782 Mixed hyperlipidemia: Secondary | ICD-10-CM | POA: Diagnosis not present

## 2017-03-15 DIAGNOSIS — L732 Hidradenitis suppurativa: Secondary | ICD-10-CM | POA: Diagnosis not present

## 2017-03-15 DIAGNOSIS — Z8249 Family history of ischemic heart disease and other diseases of the circulatory system: Secondary | ICD-10-CM | POA: Diagnosis not present

## 2017-03-15 DIAGNOSIS — L989 Disorder of the skin and subcutaneous tissue, unspecified: Secondary | ICD-10-CM | POA: Diagnosis not present

## 2017-03-15 HISTORY — PX: EXCISION OF BREAST LESION: SHX6676

## 2017-03-15 HISTORY — DX: Anxiety disorder, unspecified: F41.9

## 2017-03-15 LAB — POCT HEMOGLOBIN-HEMACUE: HEMOGLOBIN: 13 g/dL (ref 12.0–15.0)

## 2017-03-15 SURGERY — EXCISION OF BREAST LESION
Anesthesia: General | Site: Breast | Laterality: Right

## 2017-03-15 MED ORDER — PROPOFOL 10 MG/ML IV BOLUS
INTRAVENOUS | Status: AC
  Filled 2017-03-15: qty 40

## 2017-03-15 MED ORDER — SODIUM CHLORIDE 0.9 % IV SOLN
INTRAVENOUS | Status: AC | PRN
  Administered 2017-03-15: 1000 mL

## 2017-03-15 MED ORDER — EPHEDRINE 5 MG/ML INJ
INTRAVENOUS | Status: AC
  Filled 2017-03-15: qty 10

## 2017-03-15 MED ORDER — PHENYLEPHRINE 40 MCG/ML (10ML) SYRINGE FOR IV PUSH (FOR BLOOD PRESSURE SUPPORT)
PREFILLED_SYRINGE | INTRAVENOUS | Status: AC
  Filled 2017-03-15: qty 10

## 2017-03-15 MED ORDER — VANCOMYCIN HCL IN DEXTROSE 1-5 GM/200ML-% IV SOLN
1000.0000 mg | INTRAVENOUS | Status: AC
  Administered 2017-03-15: 1000 mg via INTRAVENOUS

## 2017-03-15 MED ORDER — GABAPENTIN 300 MG PO CAPS
ORAL_CAPSULE | ORAL | Status: AC
  Filled 2017-03-15: qty 1

## 2017-03-15 MED ORDER — VANCOMYCIN HCL IN DEXTROSE 1-5 GM/200ML-% IV SOLN
INTRAVENOUS | Status: AC
  Filled 2017-03-15: qty 200

## 2017-03-15 MED ORDER — SCOPOLAMINE 1 MG/3DAYS TD PT72
MEDICATED_PATCH | TRANSDERMAL | Status: AC
  Filled 2017-03-15: qty 1

## 2017-03-15 MED ORDER — SUFENTANIL CITRATE 50 MCG/ML IV SOLN: INTRAVENOUS | Status: DC | PRN

## 2017-03-15 MED ORDER — DIPHENHYDRAMINE HCL 50 MG/ML IJ SOLN
INTRAMUSCULAR | Status: AC
  Filled 2017-03-15: qty 1

## 2017-03-15 MED ORDER — HYDROMORPHONE HCL 1 MG/ML IJ SOLN: 0.2500 mg | INTRAMUSCULAR | Status: DC | PRN

## 2017-03-15 MED ORDER — LIDOCAINE 2% (20 MG/ML) 5 ML SYRINGE
INTRAMUSCULAR | Status: AC
  Filled 2017-03-15: qty 5

## 2017-03-15 MED ORDER — ONDANSETRON HCL 4 MG/2ML IJ SOLN
INTRAMUSCULAR | Status: AC
  Filled 2017-03-15: qty 2

## 2017-03-15 MED ORDER — DIPHENHYDRAMINE HCL 50 MG/ML IJ SOLN
INTRAMUSCULAR | Status: DC | PRN
  Administered 2017-03-15: 12.5 mg via INTRAVENOUS

## 2017-03-15 MED ORDER — DEXAMETHASONE SODIUM PHOSPHATE 10 MG/ML IJ SOLN
INTRAMUSCULAR | Status: AC
  Filled 2017-03-15: qty 1

## 2017-03-15 MED ORDER — SODIUM CHLORIDE 0.9 % IJ SOLN
INTRAMUSCULAR | Status: AC
  Filled 2017-03-15: qty 10

## 2017-03-15 MED ORDER — LACTATED RINGERS IV SOLN
INTRAVENOUS | Status: DC
  Administered 2017-03-15 (×2): via INTRAVENOUS

## 2017-03-15 MED ORDER — ONDANSETRON HCL 4 MG/2ML IJ SOLN
INTRAMUSCULAR | Status: DC | PRN
  Administered 2017-03-15: 4 mg via INTRAVENOUS

## 2017-03-15 MED ORDER — SUFENTANIL CITRATE 50 MCG/ML IV SOLN
INTRAVENOUS | Status: DC | PRN
  Administered 2017-03-15: 5 ug via INTRAVENOUS

## 2017-03-15 MED ORDER — DEXAMETHASONE SODIUM PHOSPHATE 4 MG/ML IJ SOLN
INTRAMUSCULAR | Status: DC | PRN
  Administered 2017-03-15: 10 mg via INTRAVENOUS

## 2017-03-15 MED ORDER — ONDANSETRON HCL 4 MG/2ML IJ SOLN: 4.0000 mg | Freq: Once | INTRAMUSCULAR | Status: DC | PRN

## 2017-03-15 MED ORDER — ACETAMINOPHEN 500 MG PO TABS
1000.0000 mg | ORAL_TABLET | ORAL | Status: AC
  Administered 2017-03-15: 1000 mg via ORAL

## 2017-03-15 MED ORDER — SUFENTANIL CITRATE 50 MCG/ML IV SOLN
INTRAVENOUS | Status: AC
  Filled 2017-03-15: qty 1

## 2017-03-15 MED ORDER — MIDAZOLAM HCL 2 MG/2ML IJ SOLN
INTRAMUSCULAR | Status: AC
  Filled 2017-03-15: qty 2

## 2017-03-15 MED ORDER — ARTIFICIAL TEARS OPHTHALMIC OINT
TOPICAL_OINTMENT | OPHTHALMIC | Status: AC
  Filled 2017-03-15: qty 3.5

## 2017-03-15 MED ORDER — PROPOFOL 10 MG/ML IV BOLUS
INTRAVENOUS | Status: DC | PRN
Start: 2017-03-15 — End: 2017-03-15
  Administered 2017-03-15: 130 mg via INTRAVENOUS

## 2017-03-15 MED ORDER — MIDAZOLAM HCL 5 MG/5ML IJ SOLN
INTRAMUSCULAR | Status: DC | PRN
  Administered 2017-03-15: 2 mg via INTRAVENOUS

## 2017-03-15 MED ORDER — LIDOCAINE 2% (20 MG/ML) 5 ML SYRINGE
INTRAMUSCULAR | Status: DC | PRN
  Administered 2017-03-15: 100 mg via INTRAVENOUS

## 2017-03-15 MED ORDER — MIDAZOLAM HCL 2 MG/2ML IJ SOLN: 1.0000 mg | INTRAMUSCULAR | Status: DC | PRN

## 2017-03-15 MED ORDER — FENTANYL CITRATE (PF) 100 MCG/2ML IJ SOLN: 50.0000 ug | INTRAMUSCULAR | Status: DC | PRN

## 2017-03-15 MED ORDER — SCOPOLAMINE 1 MG/3DAYS TD PT72
MEDICATED_PATCH | TRANSDERMAL | Status: DC | PRN
  Administered 2017-03-15: 1 via TRANSDERMAL

## 2017-03-15 MED ORDER — 0.9 % SODIUM CHLORIDE (POUR BTL) OPTIME
TOPICAL | Status: DC | PRN
  Administered 2017-03-15: 1000 mL

## 2017-03-15 MED ORDER — FENTANYL CITRATE (PF) 100 MCG/2ML IJ SOLN
INTRAMUSCULAR | Status: AC
  Filled 2017-03-15: qty 2

## 2017-03-15 MED ORDER — ACETAMINOPHEN 500 MG PO TABS
ORAL_TABLET | ORAL | Status: AC
  Filled 2017-03-15: qty 2

## 2017-03-15 MED ORDER — SCOPOLAMINE 1 MG/3DAYS TD PT72: 1.0000 | MEDICATED_PATCH | Freq: Once | TRANSDERMAL | Status: DC | PRN

## 2017-03-15 MED ORDER — GABAPENTIN 300 MG PO CAPS
300.0000 mg | ORAL_CAPSULE | ORAL | Status: AC
  Administered 2017-03-15: 300 mg via ORAL

## 2017-03-15 SURGICAL SUPPLY — 38 items
BANDAGE ADH SHEER 1  50/CT (GAUZE/BANDAGES/DRESSINGS) ×3 IMPLANT
BINDER BREAST MEDIUM (GAUZE/BANDAGES/DRESSINGS) ×3 IMPLANT
BLADE SURG 15 STRL LF DISP TIS (BLADE) ×1 IMPLANT
BLADE SURG 15 STRL SS (BLADE) ×2
CANISTER SUCT 1200ML W/VALVE (MISCELLANEOUS) ×3 IMPLANT
CHLORAPREP W/TINT 26ML (MISCELLANEOUS) ×3 IMPLANT
CLOSURE WOUND 1/2 X4 (GAUZE/BANDAGES/DRESSINGS) ×1
COVER BACK TABLE 60X90IN (DRAPES) ×3 IMPLANT
COVER MAYO STAND STRL (DRAPES) ×3 IMPLANT
DRSG TEGADERM 4X4.75 (GAUZE/BANDAGES/DRESSINGS) ×3 IMPLANT
ELECT COATED BLADE 2.86 ST (ELECTRODE) ×3 IMPLANT
ELECT REM PT RETURN 9FT ADLT (ELECTROSURGICAL) ×3
ELECTRODE REM PT RTRN 9FT ADLT (ELECTROSURGICAL) ×1 IMPLANT
GAUZE SPONGE 4X4 12PLY STRL (GAUZE/BANDAGES/DRESSINGS) ×3 IMPLANT
GAUZE SPONGE 4X4 12PLY STRL LF (GAUZE/BANDAGES/DRESSINGS) ×3 IMPLANT
GLOVE BIO SURGEON STRL SZ7 (GLOVE) IMPLANT
GLOVE BIOGEL PI IND STRL 7.5 (GLOVE) ×1 IMPLANT
GLOVE BIOGEL PI INDICATOR 7.5 (GLOVE) ×2
GOWN STRL REUS W/ TWL LRG LVL3 (GOWN DISPOSABLE) ×3 IMPLANT
GOWN STRL REUS W/TWL LRG LVL3 (GOWN DISPOSABLE) ×6
LIQUID BAND (GAUZE/BANDAGES/DRESSINGS) ×3 IMPLANT
NS IRRIG 1000ML POUR BTL (IV SOLUTION) ×3 IMPLANT
PACK BASIN DAY SURGERY FS (CUSTOM PROCEDURE TRAY) ×3 IMPLANT
PENCIL BUTTON HOLSTER BLD 10FT (ELECTRODE) ×3 IMPLANT
SLEEVE SCD COMPRESS KNEE MED (MISCELLANEOUS) ×3 IMPLANT
SPONGE LAP 4X18 X RAY DECT (DISPOSABLE) ×3 IMPLANT
STRIP CLOSURE SKIN 1/2X4 (GAUZE/BANDAGES/DRESSINGS) ×2 IMPLANT
SUT ETHILON 4 0 PS 2 18 (SUTURE) ×6 IMPLANT
SUT MNCRL AB 3-0 PS2 18 (SUTURE) ×3 IMPLANT
SUT MNCRL AB 4-0 PS2 18 (SUTURE) ×3 IMPLANT
SUT PDS AB 2-0 CT2 27 (SUTURE) ×3 IMPLANT
SUT SILK 2 0 SH (SUTURE) ×3 IMPLANT
TAPE CLOTH SURG 4X10 WHT LF (GAUZE/BANDAGES/DRESSINGS) ×3 IMPLANT
TOWEL OR 17X24 6PK STRL BLUE (TOWEL DISPOSABLE) ×6 IMPLANT
TOWEL OR NON WOVEN STRL DISP B (DISPOSABLE) ×3 IMPLANT
TUBE CONNECTING 20'X1/4 (TUBING) ×1
TUBE CONNECTING 20X1/4 (TUBING) ×2 IMPLANT
YANKAUER SUCT BULB TIP NO VENT (SUCTIONS) ×3 IMPLANT

## 2017-03-15 NOTE — Op Note (Signed)
Operative Note   DATE OF OPERATION: 5.22.18  LOCATION: [facility]-outpatient  SURGICAL DIVISION: Plastic Surgery  PREOPERATIVE DIAGNOSES:  Right breast cancer UOQ, acquired absence breasts  POSTOPERATIVE DIAGNOSES:  same  PROCEDURE:  Bilateral tissue expansion  SURGEON: Irene Limbo MD MBA  ASSISTANT: none  ANESTHESIA:  General.   EBL: minimal  COMPLICATIONS: None immediate.   INDICATIONS FOR PROCEDURE:  The patient, Katelyn Shelton, is a 41 y.o. female born on 01-19-1976, is here for excision right NAC for positive margins by Dr. Donne Hazel. Patient has undergone bilateral prepectoral reconstruction and plan exchange air for salone in expanders.   FINDINGS: Right expander filled with 340 ml saline, left expander filled with 350 ml saline. Bilateral drains removed.  DESCRIPTION OF PROCEDURE:  The patient's operative site was marked with the patient in the preoperative area. The patient was taken to the operating room. SCDs were placed and IV antibiotics were given. The patient's operative site was prepped and draped in a sterile fashion. A time out was performed and all information was confirmed to be correct. Following closure right NAC excision, the right port was accessed and air removed. Saline infiltrated to 340 ml. The drapes were removed and left chest port site identified and accessed. Air evacuated from expander and filled to 360 ml. Bilateral JP drains removed and dressed with dry dressing.  The patient was allowed to wake from anesthesia, extubated and taken to the recovery room in satisfactory condition.   SPECIMENS: none  DRAINS: none  Irene Limbo, MD [facility] Plastic [facility] Reconstructive Surgery [phone], pin [phone]

## 2017-03-15 NOTE — Discharge Instructions (Signed)
Post Anesthesia Home Care Instructions  Activity: Get plenty of rest for the remainder of the day. A responsible individual must stay with you for 24 hours following the procedure.  For the next 24 hours, DO NOT: -Drive a car -Paediatric nurse -Drink alcoholic beverages -Take any medication unless instructed by your physician -Make any legal decisions or sign important papers.  Meals: Start with liquid foods such as gelatin or soup. Progress to regular foods as tolerated. Avoid greasy, spicy, heavy foods. If nausea and/or vomiting occur, drink only clear liquids until the nausea and/or vomiting subsides. Call your physician if vomiting continues.  Special Instructions/Symptoms: Your throat may feel dry or sore from the anesthesia or the breathing tube placed in your throat during surgery. If this causes discomfort, gargle with warm salt water. The discomfort should disappear within 24 hours.  If you had a scopolamine patch placed behind your ear for the management of post- operative nausea and/or vomiting:  1. The medication in the patch is effective for 72 hours, after which it should be removed.  Wrap patch in a tissue and discard in the trash. Wash hands thoroughly with soap and water. 2. You may remove the patch earlier than 72 hours if you experience unpleasant side effects which may include dry mouth, dizziness or visual disturbances. 3. Avoid touching the patch. Wash your hands with soap and water after contact with the patch.   [place] Office Phone Number [place] Office Phone Number [phone]  POST OP INSTRUCTIONS  Always review your discharge instruction sheet given to you by the facility where your surgery was performed.  IF YOU HAVE DISABILITY OR FAMILY LEAVE FORMS, YOU MUST BRING THEM TO THE OFFICE FOR PROCESSING.  DO NOT GIVE THEM TO YOUR DOCTOR.  1. A prescription for pain medication may be given to you  upon discharge.  Take your pain medication as prescribed, if needed.  If narcotic pain medicine is not needed, then you may take acetaminophen (Tylenol), naprosyn (Alleve) or ibuprofen (Advil) as needed. 2. Take your usually prescribed medications unless otherwise directed 3. If you need a refill on your pain medication, please contact your pharmacy.  They will contact our office to request authorization.  Prescriptions will not be filled after 5pm or on week-ends. 4. You should eat very light the first 24 hours after surgery, such as soup, crackers, pudding, etc.  Resume your normal diet the day after surgery. 5. Most patients will experience some swelling and bruising in the breast.  Ice packs and a good support bra will help.  Wear the breast binder provided or a sports bra for 72 hours day and night.  After that wear a sports bra during the day until you return to the office. Swelling and bruising can take several days to resolve.  6. It is common to experience some constipation if taking pain medication after surgery.  Increasing fluid intake and taking a stool softener will usually help or prevent this problem from occurring.  A mild laxative (Milk of Magnesia or Miralax) should be taken according to package directions if there are no bowel movements after 48 hours. 7. Unless discharge instructions indicate otherwise, you may remove your bandages 48 hours after surgery and you may shower at that time.  You may have steri-strips (small skin tapes) in place directly over the incision.  These strips should be left on the skin for 7-10 days and will come off on their own.  If your surgeon used skin glue on the incision, you may shower in 24 hours.  The glue will flake off over the next 2-3 weeks.  Any sutures or staples will be removed at the office during your follow-up visit. 8. ACTIVITIES:  You may resume regular daily activities (gradually increasing) beginning the next day.  Wearing a good support bra  or sports bra minimizes pain and swelling.  You may have sexual intercourse when it is comfortable. a. You may drive when you no longer are taking prescription pain medication, you can comfortably wear a seatbelt, and you can safely maneuver your car and apply brakes. b. RETURN TO WORK:  ______________________________________________________________________________________ 9. You should see your doctor in the office for a follow-up appointment approximately two weeks after your surgery.  Your doctors nurse will typically make your follow-up appointment when she calls you with your pathology report.  Expect your pathology report 3-4 business days after your surgery.  You may call to check if you do not hear from [place] after three days. 10. OTHER INSTRUCTIONS: _______________________________________________________________________________________________ _____________________________________________________________________________________________________________________________________ _____________________________________________________________________________________________________________________________________ _____________________________________________________________________________________________________________________________________  WHEN TO CALL DR Hung Rhinesmith: 1. Fever over 101.0 2. Nausea and/or vomiting. 3. Extreme swelling or bruising. 4. Continued bleeding from incision. 5. Increased pain, redness, or drainage from the incision.  The clinic staff is available to answer your questions during regular business hours.  Please dont hesitate to call and ask to speak to one of the nurses for clinical concerns.  If you have a medical emergency, go to the nearest emergency room or call 911.  A surgeon from [facility] is always on call at the hospital.  For further questions, please visit [email or web address]

## 2017-03-15 NOTE — Anesthesia Procedure Notes (Signed)
Procedure Name: LMA Insertion Date/Time: 03/15/2017 7:46 AM Performed by: Lillia Abed Pre-anesthesia Checklist: Patient identified, Emergency Drugs available, Suction available and Patient being monitored Patient Re-evaluated:Patient Re-evaluated prior to inductionOxygen Delivery Method: Circle system utilized Preoxygenation: Pre-oxygenation with 100% oxygen Intubation Type: IV induction Ventilation: Mask ventilation without difficulty LMA: LMA inserted LMA Size: 3.0 Number of attempts: 1 Airway Equipment and Method: Bite block Placement Confirmation: positive ETCO2 Tube secured with: Tape Dental Injury: Teeth and Oropharynx as per pre-operative assessment

## 2017-03-15 NOTE — H&P (View-Only) (Signed)
41 yof here with newly diagnosed right breast cancer. She had mass noted on exam by gyn Dr Corinna Capra. she had mm that shows c density breasts. there is irregular mass with calcs in the right breast. there is also a 3 mm area of calcs in left retroareolar area. US shows a 4x1.6x3.5 cm mass at 10 oclock right breast. no abnl right axillary nodes are noted on [place]. US guided biopsy was performed of the right breast mass. 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 stereo biopsy of left calcs is fcc and this is concordant. she has no dc. she is here with her husband Dr Aundra Dubin to discuss options today   Past Surgical History Illene Regulus, [place]; 02/11/2017 8:09 AM) Cesarean Section - Multiple  Oral Surgery   Diagnostic Studies History Illene Regulus, CMA; 02/11/2017 8:09 AM) Colonoscopy  never Mammogram  1-3 years ago  Allergies Illene Regulus, CMA; 02/11/2017 8:10 AM) Sulfacetamide *CHEMICALS*  Penicillin G Benzathine & Proc *PENICILLINS*   Medication History (Alisha Spillers, CMA; 02/11/2017 8:10 AM) No Current Medications Medications Reconciled  Social History Illene Regulus, CMA; 02/11/2017 8:09 AM) Alcohol use  Occasional alcohol use. Caffeine use  Carbonated beverages, Coffee, Tea. No drug use  Tobacco use  Never smoker.  Family History Illene Regulus, CMA; 02/11/2017 8:09 AM) Bleeding disorder  Mother. Cancer  Mother. Hypertension  Mother. Melanoma  Mother.  Pregnancy / Birth History Illene Regulus, CMA; 02/11/2017 8:09 AM) Age at menarche  41 years. Contraceptive History  Oral contraceptives. Gravida  4 Irregular periods  Maternal age  40-30 Para  3  Other Problems Illene Regulus, CMA; 02/11/2017 8:09 AM) Breast Cancer  Heart murmur  Lump In Breast   Review of Systems (Alisha Spillers CMA; 02/11/2017 8:09 AM) General Not Present- Appetite Loss, Chills, Fatigue, Fever, Night Sweats, Weight  Gain and Weight Loss. Skin Not Present- Change in Wart/Mole, Dryness, Hives, Jaundice, New Lesions, Non-Healing Wounds, Rash and Ulcer. HEENT Not Present- Earache, Hearing Loss, Hoarseness, Nose Bleed, Oral Ulcers, Ringing in the Ears, Seasonal Allergies, Sinus Pain, Sore Throat, Visual Disturbances, Wears glasses/contact lenses and Yellow Eyes. Breast Present- Breast Mass, Breast Pain and Skin Changes. Not Present- Nipple Discharge. Cardiovascular Not Present- Chest Pain, Difficulty Breathing Lying Down, Leg Cramps, Palpitations, Rapid Heart Rate, Shortness of Breath and Swelling of Extremities. Gastrointestinal Not Present- Abdominal Pain, Bloating, Bloody Stool, Change in Bowel Habits, Chronic diarrhea, Constipation, Difficulty Swallowing, Excessive gas, Gets full quickly at meals, Hemorrhoids, Indigestion, Nausea, Rectal Pain and Vomiting. Female Genitourinary Not Present- Frequency, Nocturia, Painful Urination, Pelvic Pain and Urgency. Musculoskeletal Not Present- Back Pain, Joint Pain, Joint Stiffness, Muscle Pain, Muscle Weakness and Swelling of Extremities. Neurological Not Present- Decreased Memory, Fainting, Headaches, Numbness, Seizures, Tingling, Tremor, Trouble walking and Weakness. Psychiatric Not Present- Anxiety, Bipolar, Change in Sleep Pattern, Depression, Fearful and Frequent crying. Endocrine Not Present- Cold Intolerance, Excessive Hunger, Hair Changes, Heat Intolerance, Hot flashes and New Diabetes. Hematology Not Present- Blood Thinners, Easy Bruising, Excessive bleeding, Gland problems, HIV and Persistent Infections.  Vitals (Alisha Spillers CMA; 02/11/2017 8:10 AM) 02/11/2017 8:10 AM Weight: 123 lb Height: 60in Body Surface Area: 1.52 m Body Mass Index: 24.02 kg/m  Pulse: 102 (Regular)  BP: 104/62 (Sitting, Left Arm, Standard)  Physical Exam Rolm Bookbinder MD; 02/11/2017 10:53 AM) General Mental Status-Alert. Orientation-Oriented X3. Chest and Lung  Exam Chest and lung exam reveals -on auscultation, normal breath sounds, no adventitious sounds and  normal vocal resonance. Breast Nipples-No Discharge. Breast Lump Right Upper Outer - Size - 4 cm (Height). Tenderness - Non Tender. Cardiovascular Cardiovascular examination reveals -normal heart sounds, regular rate and rhythm with no murmurs. Abdomen Note: soft nt/nd Lymphatic Head & Neck General Head & Neck Lymphatics: Bilateral - Description - Normal. Axillary General Axillary Region: Bilateral - Description - Normal. Note: no [place] adenopathy   Assessment & Plan Rolm Bookbinder MD; 02/11/2017 2:15 PM) BREAST CANCER OF UPPER-OUTER QUADRANT OF RIGHT FEMALE BREAST (C50.411) Bilateral nsm, right axillary sn biopsy Immediate prepec reconstruction

## 2017-03-15 NOTE — Transfer of Care (Signed)
Last Vitals:  Vitals:   03/15/17 0650  BP: 96/60  Pulse: 74  Resp: 18  Temp: 36.8 C    Last Pain:  Vitals:   03/15/17 0650  TempSrc: Oral  PainSc: 3       Patients Stated Pain Goal: 3 (03/15/17 6945)  Immediate Anesthesia Transfer of Care Note  Patient: Katelyn Shelton  Procedure(s) Performed: Procedure(s) (LRB): RIGHT NIPPLE AND AREOLA EXCISION (Right)  Patient Location: PACU  Anesthesia Type: General  Level of Consciousness: awake, alert  and oriented  Airway & Oxygen Therapy: Patient Spontanous Breathing and Patient connected to face mask oxygen  Post-op Assessment: Report given to PACU RN and Post -op Vital signs reviewed and stable  Post vital signs: Reviewed and stable  Complications: No apparent anesthesia complications

## 2017-03-15 NOTE — Brief Op Note (Signed)
03/15/2017  8:45 AM  PATIENT:  Katelyn Shelton  41 y.o. female  PRE-OPERATIVE DIAGNOSIS:  RIGHT BREAST CANCER  POST-OPERATIVE DIAGNOSIS:  RIGHT BREAST CANCER  PROCEDURE:  Procedure(s): RIGHT NIPPLE AND AREOLA EXCISION (Right)  SURGEON:  Surgeon(s) and Role:    * Rolm Bookbinder, MD - Primary    * Irene Limbo, MD - Assisting  PHYSICIAN ASSISTANT:   ASSISTANTS: none   ANESTHESIA:   general  EBL:  Total I/O In: 800 [I.V.:800] Out: 10 [Blood:10]  BLOOD ADMINISTERED:none  DRAINS: none   LOCAL MEDICATIONS USED:  NONE  SPECIMEN:  Excision  DISPOSITION OF SPECIMEN:  PATHOLOGY  COUNTS:  YES  TOURNIQUET:  * No tourniquets in log *  DICTATION: .Dragon Dictation  PLAN OF CARE: Discharge to home after PACU  PATIENT DISPOSITION:  PACU - hemodynamically stable.   Delay start of Pharmacological VTE agent (>24hrs) due to surgical blood loss or risk of bleeding: not applicable

## 2017-03-15 NOTE — H&P (Signed)
Subjective:     Patient ID: Katelyn Shelton is a 41 y.o. female.  HPI  2 weeks post op. Drains each 15 ml or less.  Presented with palpable mass right breast. MMG with mass containing heterogeneous calcifications  identified within the UOQ. Additional 3 mm group of slightly heterogeneous calcifications within the retroareolar left breast noted. US showed 4 x 1.6 x 3.5 cm mass at the 10 o'clock position 4 cm from the nipple. Biopsy demonstrated IDC with DCIS, ER/PR+, Her2-. Final pathology 1/1 SLN, right IDC spanning at least 4 cm with both retroareolar and nipple specimen positive for IDC. +LVI, perineural invasion.  Oncotype 11. Genetics negative. Mammaprint low risk. Adjuvant XRT anticipated.  PMH significant for silicone implant as part of pectus excavatum repair performed by Dr. Jaclynn Guarneri 1996.  Prior 32B, happy with this. Left mastectomy 289 g Right 239 g     Objective:   Physical Exam Chest: NAC bilateral viable left with small epidermolysis Drains serous scant CV: normal heart sounds Pulm: clear to auscultation  Assessment:     R breast ca UOQ Pectus excavatum, s/p silicone implant placement S/p bilateral NSM, right SLN, TE/ADM (Alloderm) reconstruction    Plan:     Plan NAC resection right by Dr. Donne Hazel. I will be present for any assistance on this. Plan removal bilateral remaining drains and . do saline implant exchange while under anesthesia.  Reviewed XRT will increase risks reconstruction with patient and her husband. XRT anticipated to start late June. Counseled I do not feel we will be able to do another surgery and be well healed prior to that for implant exchange. Counseled several month duration post end XRT for implant exchange.  Discussed will feel heavier and firmer post saline exchange.  Natrelle [id number]-12-T 400 ml tissue expanders placed bilateral, initial fill volume 320 ml air   Irene Limbo, MD [facility] [phone], pin [phone]

## 2017-03-15 NOTE — Interval H&P Note (Signed)
History and Physical Interval Note:  03/15/2017 7:34 AM  Katelyn Shelton  has presented today for surgery, with the diagnosis of RIGHT BREAST CANCER  The various methods of treatment have been discussed with the patient and family. After consideration of risks, benefits and other options for treatment, the patient has consented to  Procedure(s): RIGHT NIPPLE AND AREOLA EXCISION (Right) as a surgical intervention .  The patient's history has been reviewed, patient examined, no change in status, stable for surgery.  I have reviewed the patient's chart and labs.  Questions were answered to the patient's satisfaction.     Tanaya Dunigan

## 2017-03-15 NOTE — Addendum Note (Signed)
Addendum  created 03/15/17 1234 by Mechele Claude, CRNA    [facility] administration accepted

## 2017-03-15 NOTE — Anesthesia Preprocedure Evaluation (Signed)
Anesthesia Evaluation  Patient identified by MRN, date of birth, ID band Patient awake    Reviewed: Allergy & Precautions, NPO status , Patient's Chart, lab work & pertinent test results  Airway Mallampati: I  TM Distance: >3 FB Neck ROM: Full    Dental   Pulmonary    Pulmonary exam normal        Cardiovascular Normal cardiovascular exam     Neuro/Psych Anxiety Depression    GI/Hepatic   Endo/Other    Renal/GU      Musculoskeletal   Abdominal   Peds  Hematology   Anesthesia Other Findings   Reproductive/Obstetrics                             Anesthesia Physical Anesthesia Plan  ASA: II  Anesthesia Plan: General   Post-op Pain Management:    Induction: Intravenous  Airway Management Planned: LMA  Additional Equipment:   Intra-op Plan:   Post-operative Plan: Extubation in OR  Informed Consent: I have reviewed the patients History and Physical, chart, labs and discussed the procedure including the risks, benefits and alternatives for the proposed anesthesia with the patient or authorized representative who has indicated his/her understanding and acceptance.     Plan Discussed with: CRNA and Surgeon  Anesthesia Plan Comments:         Anesthesia Quick Evaluation

## 2017-03-15 NOTE — Anesthesia Postprocedure Evaluation (Signed)
Anesthesia Post Note  Patient: [name]  Procedure(s) Performed: Procedure(s) (LRB): RIGHT NIPPLE AND AREOLA EXCISION (Right)  Patient location during evaluation: PACU Anesthesia Type: General Level of consciousness: awake and alert Pain management: pain level controlled Vital Signs Assessment: post-procedure vital signs reviewed and stable Respiratory status: spontaneous breathing, nonlabored ventilation, respiratory function stable and patient connected to nasal cannula oxygen Cardiovascular status: blood pressure returned to baseline and stable Postop Assessment: no signs of nausea or vomiting Anesthetic complications: no       Last Vitals:  Vitals:   03/15/17 0929 03/15/17 1020  BP: 97/61 103/61  Pulse: 82 68  Resp: 14 16  Temp:  37.3 C    Last Pain:  Vitals:   03/15/17 1020  TempSrc: Oral  PainSc: 0-No pain                 Eduin Friedel DAVID

## 2017-03-16 ENCOUNTER — Encounter (HOSPITAL_BASED_OUTPATIENT_CLINIC_OR_DEPARTMENT_OTHER): Payer: Self-pay | Admitting: General Surgery

## 2017-03-16 NOTE — Op Note (Signed)
Preoperative diagnosis: right breast cancer s/p nsm with positive nipple margin Postoperative diagnosis: same as above Procedure: excision of right nipple areola complex Surgeon: Dr Serita Grammes Asst: Dr Irene Limbo Anesthesia general ebl minimal Complications none Drains none Specimens right nac to pathology marked short superior long lateral dispo to recovery stable Sponge and needle count correct  Indications: this is 52 yof who has right breast cancer s/p bilateral nsm with prepec expander reconstruction due to sternal prosthesis from prior pectus deformity.  She had one node positive and we have discussed radiotherapy and not proceeding with alnd for that. She also had more extensive cancer and her nipple margin is positive. I recommended excision of right nipple areolar complex.  Procedure: After informed consent obtained patient taken to OR. She was given antibiotics.  She had SCDs in placed. She was placed under general anesthesia without complication. She was prepped and draped in standard sterile surgical fashion. Timeout performed.  I made elliptical incision around nipple areolar complex. I then dissected down to the ADM from the reconstruction. The entire nipple areolar complex was then excised.  This was marked as above and passed off as a specimen.  The subq tissue was then closed by Dr Iran Planas after deflating the expander with monocryl suture.  She then reexpanded with saline. The incision was then closed with multiple simple interrupted 3-0 nylon sutures.  The skin was all viable. Glue and steristrips were then placed. She tolerated this well, was extubated and transferred to recovery stable.

## 2017-03-22 NOTE — Addendum Note (Signed)
Encounter addended by: Doreen Beam, RN on: 03/22/2017  9:21 AM<BR>    Actions taken: Charge Capture section accepted

## 2017-03-23 DIAGNOSIS — Z170 Estrogen receptor positive status [ER+]: Secondary | ICD-10-CM | POA: Diagnosis not present

## 2017-03-23 DIAGNOSIS — C50411 Malignant neoplasm of upper-outer quadrant of right female breast: Secondary | ICD-10-CM | POA: Diagnosis not present

## 2017-03-25 ENCOUNTER — Ambulatory Visit (HOSPITAL_BASED_OUTPATIENT_CLINIC_OR_DEPARTMENT_OTHER): Payer: 59 | Admitting: Oncology

## 2017-03-25 ENCOUNTER — Ambulatory Visit: Payer: Self-pay | Admitting: Oncology

## 2017-03-25 ENCOUNTER — Encounter: Payer: Self-pay | Admitting: *Deleted

## 2017-03-25 VITALS — BP 105/65 | HR 71 | Temp 97.6°F | Resp 18 | Ht 60.0 in | Wt 118.7 lb

## 2017-03-25 DIAGNOSIS — Z170 Estrogen receptor positive status [ER+]: Secondary | ICD-10-CM | POA: Diagnosis not present

## 2017-03-25 DIAGNOSIS — C50411 Malignant neoplasm of upper-outer quadrant of right female breast: Secondary | ICD-10-CM

## 2017-03-25 NOTE — Progress Notes (Signed)
[facility]  [phone] [phone]     ID: TAYLLOR BREITENSTEIN DOB: 1976-01-08  MR#: [id number]  WTU#:[id number]  Patient Care Team: Katelyn Herb, MD as PCP - General (Family Medicine) [facility], Katelyn Dad, MD as Consulting Physician (Oncology) Katelyn Bookbinder, MD as Consulting Physician (General Surgery) Louretta Shorten, MD as Consulting Physician (Obstetrics and Gynecology) Katelyn Cruel, MD OTHER MD:  CHIEF COMPLAINT: Estrogen receptor positive breast cancer  CURRENT TREATMENT: Tamoxifen; adjuvant radiation pending   BREAST CANCER HISTORY: From the original intake note:  "K[facility]n [facility]" saw Dr. Corinna Shelton for routine follow-up and was found to have a palpable mass in the upper outer quadrant of her right breast and was set up for bilateral diagnostic mammography with tomography and right breast ultrasonography at the [facility] 02/08/2017. The breast density was category C. In the upper outer quadrant of the right breast there was an irregular mass. There was also a 0.3 cm group of slightly heterogeneous calcifications in the retroareolar left breast. On exam there was indeed a firm palpable mass at the 10:00 position of the right breast 4 cm from the nipple. On ultrasound this measured 3.5 cm, it was irregular and hypoechoic. There was no abnormal right axillary adenopathy.  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 The area of left breast calcifications was biopsied 02/09/2017 and showed only fibrocystic changes. ([id number]).  Her subsequent history is as detailed below.  INTERVAL HISTORY: [facility] returns today for follow-up of her estrogen receptor positive breast cancer accompanied by her husband Katelyn Shelton and her father Katelyn Shelton. Since her last  visit here, this had excision of her right nipple areolar complex, with the pathology ([id number]) showing no residual carcinoma. She has recovered well from the surgery but is very dissatisfied with the cosmetic result.  She continues on tamoxifen, generally with good tolerance. She has had some cramps. She has not had hot flashes although "I keep the house cold". She has had mild vaginal wetness.  In addition she met with Dr. Juanita [facility] at [facility]. He reviewed her case in detail and while agreeing in the main with the overall plan he raised to questions. One was whether a full axillae dissection should be done at this point. He is fully aware of the data suggesting that this may be not necessary, but felt given current [facility] guidelines this should be referred to their multidisciplinary breast conference for further input in decision.  He also felt, more strongly, that ovarian suppression and an aromatase inhibitor would be superior to tamoxifen alone in her situation. He acknowledged of course that there are many differences of opinion in this matter.  She is here today to discuss these issues in particular  REVIEW OF SYSTEMS: She remains very emotional, but is able to exercise and tells me her sleep is "okay". There has been no change in her eating habits, no nausea or vomiting, no change in bowel or bladder habits, no rash, no bleeding and no fever. She has minimal discomfort from her surgeries. A detailed review of systems today was otherwise stable  PAST MEDICAL HISTORY: Past Medical History:  Diagnosis Date  . Anxiety   . Breast cancer ([place]) 01/2017   right; genetic testing negative in 2018 with [facility] panel  . Depression   . History of MRSA  infection    elbow    PAST SURGICAL HISTORY: Past Surgical History:  Procedure Laterality Date  . BREAST RECONSTRUCTION WITH PLACEMENT OF TISSUE EXPANDER AND FLEX HD (ACELLULAR HYDRATED DERMIS) Bilateral 02/28/2017   Procedure: BILATERAL BREAST  RECONSTRUCTION WITH PLACEMENT OF TISSUE EXPANDER AND ALLODERM;  Surgeon: Katelyn Limbo, MD;  Location: [facility];  Service: Plastics;  Laterality: Bilateral;  . CESAREAN SECTION     x 2 (3 total)  . CESAREAN SECTION  02/04/2012   Procedure: CESAREAN SECTION;  Surgeon: Katelyn Mourning, MD;  Location: [facility];  Service: Gynecology;  Laterality: N/A;  Repeat Cesarean Section Delivery  Boy  @  [phone], Apgars 9/10  . DILATION AND CURETTAGE OF UTERUS    . EXCISION OF BREAST LESION Right 03/15/2017   Procedure: RIGHT NIPPLE AND AREOLA EXCISION;  Surgeon: Katelyn Bookbinder, MD;  Location: [facility];  Service: General;  Laterality: Right;  . NIPPLE SPARING MASTECTOMY Bilateral 02/28/2017   Procedure: RIGHT NIPPLE SPARING MASTECTOMY; LEFT PROPHYLACTIC NIPPLE SPARING MASTECTOMY;  Surgeon: Katelyn Bookbinder, MD;  Location: [facility];  Service: General;  Laterality: Bilateral;  . PECTUS EXCAVATUM REPAIR  1996  . SENTINEL NODE BIOPSY Right 02/28/2017   Procedure: RIGHT AXILLARY SENTINEL LYMPH  NODE BIOPSY;  Surgeon: Katelyn Bookbinder, MD;  Location: [facility];  Service: General;  Laterality: Right;  . VULVAR LESION REMOVAL    . WISDOM TOOTH EXTRACTION      FAMILY HISTORY Family History  Problem Relation Age of Onset  . Skin cancer Mother   . Parkinson's disease Sister 87  . Diabetes Maternal Aunt   . Heart failure Maternal Grandmother   . Heart attack Maternal Grandfather   . Stomach cancer Paternal Grandmother   . Prostate cancer Paternal Grandfather   . Alzheimer's disease Paternal Grandfather   . Breast cancer Cousin        mother's paternal first FEMALE cousin   The patient's father, Katelyn [facility], is an Administrator, [employer], currently 40 years old. The patient's mother is 20 years old as of April 2018. Patient has no brothers, 2 sisters. There is a history breast cancer in the family in a great aunt on the patient's father's side. On the maternal  side there is a female first cousin of the patient's mother with breast cancer. There is no history of ovarian cancer in the family  GYNECOLOGIC HISTORY:  No LMP recorded. K[facility] had her first menstrual period age 33, her first live birth age 54. She has had 3 C-sections. Her periods are approximately every 21 days, with one heavy day. Her husband is status post vasectomy.  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 Her husband Katelyn Shelton is one of our cardiologists. At that patient attends [employer]:    HEALTH MAINTENANCE: Social History  Substance Use Topics  . Smoking status: Never Smoker  . Smokeless tobacco: Never Used  . Alcohol use No     Colonoscopy:  PAP:  Bone density:   Allergies  Allergen Reactions  . Peach Flavor Anaphylaxis  . Fentanyl Itching  . Penicillins Hives    AS A CHILD  . Sulfa Antibiotics Nausea And Vomiting  . Latex Itching    Current Outpatient Prescriptions  Medication Sig Dispense Refill  . acetaminophen (TYLENOL) 325 MG tablet Take 650 mg by mouth every 6 (six) hours as needed.    [name]  doxycycline (VIBRAMYCIN) 50 MG capsule Take 50 mg by mouth.    . fluticasone (FLONASE) 50 MCG/ACT nasal spray Place 1 spray into both nostrils daily as needed.    [name] ibuprofen (ADVIL,MOTRIN) 200 MG tablet Take 200 mg by mouth every 6 (six) hours as needed.    [name] LORazepam (ATIVAN) 0.5 MG tablet TAKE 1/2 TO 1 TABLET BY MOUTH AT BEDTIME AS NEEDED FOR ANXIETY 30 tablet 3  . LORazepam (ATIVAN) 0.5 MG tablet Take 0.5 mg by mouth.    . methocarbamol (ROBAXIN) 500 MG tablet Take 500 mg by mouth.    . ondansetron (ZOFRAN-ODT) 4 MG disintegrating tablet Take 1 tablet (4 mg total) by mouth every 6 (six) hours as needed for nausea. 20 tablet 0  . oxyCODONE (OXY IR/ROXICODONE) 5 MG immediate release tablet Take 1-2 tablets (5-10 mg total) by mouth every 4 (four) hours as needed for  moderate pain. 40 tablet 0  . tamoxifen (NOLVADEX) 10 MG tablet Take 10 mg by mouth 2 (two) times daily.     No current facility-administered medications for this visit.     OBJECTIVE: Young white woman In no acute distress  Vitals:   03/25/17 0816  BP: 105/65  Pulse: 71  Resp: 18  Temp: 97.6 F (36.4 C)     Body mass index is 23.18 kg/m.    ECOG FS:1 - Symptomatic but completely ambulatory  Sclerae unicteric, EOMs intact Oropharynx clear and moist No cervical or supraclavicular adenopathy Lungs no rales or rhonchi Heart regular rate and rhythm Abd soft, nontender, positive bowel sounds MSK no focal spinal tenderness, no upper extremity lymphedema Neuro: nonfocal, well oriented, tearful affect Breasts: Status post bilateral mastectomies and status post right nipple areolar resection. The overall cosmetic result is good but there is asymmetry due to the loss of the nipple on the right. Both axillae are benign.  LAB RESULTS:  CMP     Component Value Date/Time   NA 136 07/12/2014 0851   K 4.2 07/12/2014 0851   CL 99 07/12/2014 0851   CO2 20 07/12/2014 0851   GLUCOSE 80 07/12/2014 0851   GLUCOSE 88 12/21/2013 1226   BUN 15 07/12/2014 0851   CREATININE 0.68 07/12/2014 0851   CALCIUM 8.8 07/12/2014 0851   PROT 7.1 07/12/2014 0851   ALBUMIN 4.6 07/12/2014 0851   AST 21 07/12/2014 0851   ALT 17 07/12/2014 0851   ALKPHOS 40 07/12/2014 0851   BILITOT 0.8 07/12/2014 0851   GFRNONAA 112 07/12/2014 0851   GFRAA 129 07/12/2014 0851    No results found for: TOTALPROTELP, ALBUMINELP, A1GS, A2GS, BETS, BETA2SER, GAMS, MSPIKE, SPEI  No results found for: Nils Pyle, [facility]  Lab Results  Component Value Date   WBC 5.9 07/12/2014   NEUTROABS 5.4 12/21/2013   HGB 13.0 03/15/2017   HCT 40.8 07/12/2014   MCV 93.2 07/12/2014   PLT 202 12/21/2013      Chemistry      Component Value Date/Time   NA 136 07/12/2014 0851   K 4.2 07/12/2014 0851   CL 99  07/12/2014 0851   CO2 20 07/12/2014 0851   BUN 15 07/12/2014 0851   CREATININE 0.68 07/12/2014 0851      Component Value Date/Time   CALCIUM 8.8 07/12/2014 0851   ALKPHOS 40 07/12/2014 0851   AST 21 07/12/2014 0851   ALT 17 07/12/2014 0851   BILITOT 0.8 07/12/2014 0851       No results found for: LABCA2  No components found  for: [id number]  No results for input(s): INR in the last 168 hours.  Urinalysis    Component Value Date/Time   COLORURINE YELLOW 08/26/2011 1652   APPEARANCEUR CLEAR 08/26/2011 1652   LABSPEC 1.025 12/21/2013 1226   PHURINE 6.0 12/21/2013 1226   GLUCOSEU NEGATIVE 12/21/2013 1226   HGBUR TRACE (A) 12/21/2013 1226   BILIRUBINUR neg 07/12/2014 0913   KETONESUR NEGATIVE 12/21/2013 1226   PROTEINUR 4+ 07/12/2014 0913   PROTEINUR NEGATIVE 12/21/2013 1226   UROBILINOGEN negative 07/12/2014 0913   UROBILINOGEN 0.2 12/21/2013 1226   NITRITE neg 07/12/2014 0913   NITRITE NEGATIVE 12/21/2013 1226   LEUKOCYTESUR small (1+) 07/12/2014 0913     STUDIES: No results found.  ELIGIBLE FOR AVAILABLE RESEARCH PROTOCOL: no  ASSESSMENT: 41 y.o. [place] woman status post right breast upper outer quadrant biopsy 02/08/2017 a clinical T2 N0, stage IB invasive ductal carcinoma, grade 2, estrogen and progesterone receptor positive, HER-2 nonamplified, with an MIB-1 of 5%.  (a) biopsy of upper outer quadrant calcifications in the left breast 02/09/2017 were benign  (1) Oncotype DX obtained from the biopsy sample showed a recurrence score of 11 predicting a 10 year risk of recurrence outside the breast of 9% if the patient's only systemic therapy is tamoxifen for 5 years (node positive report)  (2) bilateral mastectomies with right sentinel lymph node sampling 02/28/2017 showed  (a) on the left, intraductal papilloma, with no evidence of malignancy.  (b) on the right, a pT2 pN1 stage IA invasive ductal carcinoma, grade 1, with close margins  (c) additional excision of  the right nipple areolar area 03/15/2017 found no residual carcinoma  (3) Mammaprint study on the final surgical sample came back low risk, predicting a five-year distant disease-free survival of 97.8% with hormone therapy alone   (3) adjuvant radiation pending  (4) tamoxifen started neoadjuvantly 02/11/2017 t  (5) genetics counseling 02/19/2017 Showed no deleterious mutations in the STAT gene panel offered by [facility] including ATM, BRCA1, BRCA2, CDH1, CHEK2, PALB2, PTEN, STK11, and TP53.  (a) multi gene panel results pending   PLAN: We spent a little over an hour today going over [facility]'s situation, which is generally quite favorable given the results of her prognostic panels, but which nevertheless continues to cause her understandable anxiety and concern.  We reviewed the data regarding full axillary dissection in this setting. Dr. Juanita [facility] at [facility] referred to the randomized and observational studies that suggest completing axillary dissection in someone like [facility] may be safely avoided, but that full axillary dissection remains the standard of care following mastectomy in these cases. Our opinion in the [place] is that in a patient like [facility] a completion axillary dissection is best avoided. Our radiation oncologists take cognizance of this fact and adjust the radiation field accordingly to cover the appropriate portions of the axilla. Accordingly I feel very comfortable recommending that she not proceed to full axillary dissection.  The question regarding anti-estrogen choice is more complex. My general approach to the SOFT/TEXT studies is not to strongly consider ovarian suppression or ablation except in patients under the age of 63 who received chemotherapy and resumed menses afterwards. [facility] is really on the borderline because while she did not receive chemotherapy she would have at the time these studies were conducted (young patients with a positive lymph node certainly  would have been treated at that time). Also she was 40 at the time of diagnosis.  Accordingly proceeding with goserelin and anastrozole is not in the least  unreasonable. The concern I have of course is menopausal symptoms which we discussed in detail today. These include mood changes, hot flashes, vaginal dryness, weight gain, bone density loss, hair loss and insomnia, among others. [facility] is going through a difficult transition at this point and I am not sure adding these complications while she is still dealing with the recent surgery and facing radiation is a good ideat.  Nevertheless she is very interested in pursuing this option and she is also interested in seeking another opinion at [facility].  She is already scheduled to meet with radiation oncology next week. Tentatively I have made her a return appointment with me in mid July at which time these questions can be taken up again.  She knows to call for any other issues that may develop before her next visit here.     Katelyn Cruel, MD   03/25/2017 8:18 AM Medical Oncology and Hematology [facility] [address] [place], [place] [address] Tel. [phone]    Fax. [phone]

## 2017-03-26 ENCOUNTER — Encounter: Payer: Self-pay | Admitting: Oncology

## 2017-03-27 ENCOUNTER — Other Ambulatory Visit: Payer: Self-pay | Admitting: Oncology

## 2017-03-29 ENCOUNTER — Telehealth: Payer: Self-pay | Admitting: Oncology

## 2017-03-29 ENCOUNTER — Other Ambulatory Visit: Payer: Self-pay | Admitting: Oncology

## 2017-03-29 DIAGNOSIS — Z853 Personal history of malignant neoplasm of breast: Secondary | ICD-10-CM | POA: Diagnosis not present

## 2017-03-29 DIAGNOSIS — C50411 Malignant neoplasm of upper-outer quadrant of right female breast: Secondary | ICD-10-CM | POA: Diagnosis not present

## 2017-03-29 DIAGNOSIS — Z170 Estrogen receptor positive status [ER+]: Secondary | ICD-10-CM | POA: Diagnosis not present

## 2017-03-29 NOTE — Telephone Encounter (Signed)
Faxed records to [facility] [phone]

## 2017-03-30 ENCOUNTER — Ambulatory Visit: Payer: 59 | Admitting: Licensed Clinical Social Worker

## 2017-03-30 DIAGNOSIS — Z170 Estrogen receptor positive status [ER+]: Secondary | ICD-10-CM | POA: Diagnosis not present

## 2017-03-30 DIAGNOSIS — C50911 Malignant neoplasm of unspecified site of right female breast: Secondary | ICD-10-CM | POA: Diagnosis not present

## 2017-03-31 ENCOUNTER — Ambulatory Visit: Payer: 59 | Admitting: Radiation Oncology

## 2017-03-31 ENCOUNTER — Other Ambulatory Visit: Payer: Self-pay | Admitting: Oncology

## 2017-03-31 ENCOUNTER — Telehealth: Payer: Self-pay | Admitting: *Deleted

## 2017-03-31 DIAGNOSIS — C50911 Malignant neoplasm of unspecified site of right female breast: Secondary | ICD-10-CM | POA: Insufficient documentation

## 2017-03-31 NOTE — Telephone Encounter (Signed)
Pt called wishing to cancel her SIM today. She is deciding on her next steps and will plan to call the beginning of next week to discuss. Physician team notified. Went to [facility] for third opinion. They discussed chemo. Informed pt that she is gathering information to make a decision on her treatment. Gave pt emotional support and encouragement.  Denies further questions or needs at this time.

## 2017-04-01 ENCOUNTER — Other Ambulatory Visit: Payer: Self-pay | Admitting: Oncology

## 2017-04-04 ENCOUNTER — Other Ambulatory Visit: Payer: Self-pay

## 2017-04-04 ENCOUNTER — Other Ambulatory Visit: Payer: Self-pay | Admitting: Oncology

## 2017-04-04 DIAGNOSIS — Z170 Estrogen receptor positive status [ER+]: Principal | ICD-10-CM

## 2017-04-04 DIAGNOSIS — C50411 Malignant neoplasm of upper-outer quadrant of right female breast: Secondary | ICD-10-CM

## 2017-04-04 NOTE — Progress Notes (Unsigned)
Spoke with [employer] today. She is at [facility]. She is digesting the advice from [facility] and she is strongly considering chemotherapy. She is going to meet with me 04/11/2017 to discuss that.

## 2017-04-06 ENCOUNTER — Other Ambulatory Visit: Payer: Self-pay | Admitting: Oncology

## 2017-04-06 MED ORDER — VENLAFAXINE HCL ER 75 MG PO CP24: 75.0000 mg | ORAL_CAPSULE | Freq: Every day | ORAL | 3 refills | Status: DC

## 2017-04-06 NOTE — Progress Notes (Unsigned)
Patient's dad  called venlafaxine 37.5 mg per for [facility] (she started taking it about a week ago). She is not sure if it is helping. She is considering doubling the dose to 75 mg daily, and after some discussion that is what I called in for her. She will let me know she has any side effects.

## 2017-04-08 ENCOUNTER — Ambulatory Visit: Payer: 59 | Admitting: Physical Therapy

## 2017-04-11 ENCOUNTER — Encounter: Payer: Self-pay | Admitting: Physical Therapy

## 2017-04-11 ENCOUNTER — Ambulatory Visit (HOSPITAL_BASED_OUTPATIENT_CLINIC_OR_DEPARTMENT_OTHER): Payer: 59 | Admitting: Oncology

## 2017-04-11 ENCOUNTER — Ambulatory Visit: Payer: 59 | Attending: Radiation Oncology | Admitting: Physical Therapy

## 2017-04-11 ENCOUNTER — Telehealth: Payer: Self-pay | Admitting: *Deleted

## 2017-04-11 DIAGNOSIS — M25611 Stiffness of right shoulder, not elsewhere classified: Secondary | ICD-10-CM | POA: Insufficient documentation

## 2017-04-11 DIAGNOSIS — Z170 Estrogen receptor positive status [ER+]: Secondary | ICD-10-CM | POA: Diagnosis not present

## 2017-04-11 DIAGNOSIS — Z483 Aftercare following surgery for neoplasm: Secondary | ICD-10-CM | POA: Diagnosis not present

## 2017-04-11 DIAGNOSIS — C50411 Malignant neoplasm of upper-outer quadrant of right female breast: Secondary | ICD-10-CM

## 2017-04-11 MED ORDER — ONDANSETRON HCL 8 MG PO TABS: 8.0000 mg | ORAL_TABLET | Freq: Two times a day (BID) | ORAL | 1 refills | Status: DC | PRN

## 2017-04-11 MED ORDER — DEXAMETHASONE 4 MG PO TABS: 8.0000 mg | ORAL_TABLET | Freq: Two times a day (BID) | ORAL | 1 refills | Status: DC

## 2017-04-11 MED ORDER — LORAZEPAM 0.5 MG PO TABS: 0.5000 mg | ORAL_TABLET | Freq: Every evening | ORAL | 0 refills | Status: DC | PRN

## 2017-04-11 MED ORDER — PROCHLORPERAZINE MALEATE 10 MG PO TABS: 10.0000 mg | ORAL_TABLET | Freq: Four times a day (QID) | ORAL | 1 refills | Status: DC | PRN

## 2017-04-11 NOTE — Therapy (Addendum)
[place], [place], [address] Phone: [phone]   Fax:  [phone]  Physical Therapy Evaluation  Patient Details  Name: Katelyn Shelton MRN: [id number] Date of Birth: 12/09/1975 Referring Provider:  Shona Simpson  Encounter Date: 04/11/2017      PT End of Session - 04/11/17 1205    Visit Number 1   Number of Visits 5   Date for PT Re-Evaluation 05/09/17   PT Start Time 1018   PT Stop Time 1101   PT Time Calculation (min) 43 min   Activity Tolerance Patient tolerated treatment well   Behavior During Therapy [facility] for tasks assessed/performed      Past Medical History:  Diagnosis Date  . Anxiety   . Breast cancer ([place]) 01/2017   right; genetic testing negative in 2018 with [employer] panel  . Depression   . History of MRSA infection    elbow    Past Surgical History:  Procedure Laterality Date  . BREAST RECONSTRUCTION WITH PLACEMENT OF TISSUE EXPANDER AND FLEX HD (ACELLULAR HYDRATED DERMIS) Bilateral 02/28/2017   Procedure: BILATERAL BREAST RECONSTRUCTION WITH PLACEMENT OF TISSUE EXPANDER AND ALLODERM;  Surgeon: Irene Limbo, MD;  Location: [place];  Service: Plastics;  Laterality: Bilateral;  . CESAREAN SECTION     x 2 (3 total)  . CESAREAN SECTION  02/04/2012   Procedure: CESAREAN SECTION;  Surgeon: Cyril Mourning, MD;  Location: [facility];  Service: Gynecology;  Laterality: N/A;  Repeat Cesarean Section Delivery  Boy  @  [phone], Apgars 9/10  . DILATION AND CURETTAGE OF UTERUS    . EXCISION OF BREAST LESION Right 03/15/2017   Procedure: RIGHT NIPPLE AND AREOLA EXCISION;  Surgeon: Rolm Bookbinder, MD;  Location: [place];  Service: General;  Laterality: Right;  . NIPPLE SPARING MASTECTOMY Bilateral 02/28/2017   Procedure: RIGHT NIPPLE SPARING MASTECTOMY; LEFT PROPHYLACTIC NIPPLE SPARING MASTECTOMY;  Surgeon: Rolm Bookbinder, MD;  Location: [place];  Service: General;  Laterality: Bilateral;  . PECTUS EXCAVATUM REPAIR  1996  . SENTINEL NODE BIOPSY Right 02/28/2017   Procedure: RIGHT AXILLARY SENTINEL LYMPH  NODE BIOPSY;  Surgeon: Rolm Bookbinder, MD;  Location: [facility];  Service: General;  Laterality: Right;  . VULVAR LESION REMOVAL    . WISDOM TOOTH EXTRACTION      There were no vitals filed for this visit.       Subjective Assessment - 04/11/17 1023    Subjective It pulls in my right armpit when I lift my arm up. I had bilateral mastectomies but they took a lymph node on the right and that armpit is more sore. I am starting chemotherapy this week. After I finish chemotherapy I will start radiation. I had two surgeries on the right. I had a nipple sparing masectomy but then they had to go back and remove the nipple.    Patient Stated Goals I don't know what my goal for therapy is            [facility] PT Assessment - 04/11/17 0001      Assessment   Medical Diagnosis right breast cancer   Referring Provider  Shona Simpson   Hand Dominance Right   Prior Therapy none     Precautions   Precautions Other (comment)  at risk for lymphedema     Restrictions   Weight Bearing Restrictions No     Balance Screen   Has the patient fallen in  the past 6 months No   Has the patient had a decrease in activity level because of a fear of falling?  No   Is the patient reluctant to leave their home because of a fear of falling?  No     Home Social worker Private residence   Living Arrangements Spouse/significant other;Children  ages 56, 22, 1   Available Help at Discharge Family   Type of [place]     Prior Function   Level of [place] Unemployed   Leisure pt exercised daily before - running a couple of miles a day and spending 30 min on elliptical, now pt is walking an hour or more a day     Cognition   Overall Cognitive Status Within Functional Limits for  tasks assessed     ROM / Strength   AROM / PROM / Strength AROM;Strength     AROM   Overall AROM  Within functional limits for tasks performed     Strength   Overall Strength Within functional limits for tasks performed           LYMPHEDEMA/ONCOLOGY QUESTIONNAIRE - 04/11/17 1204      Type   Cancer Type right breast cancer     Surgeries   Mastectomy Date 02/28/17   Sentinel Lymph Node Biopsy Date 02/28/17   Number Lymph Nodes Removed 1     Treatment   Active Chemotherapy Treatment No  pt to begin next week   Past Chemotherapy Treatment No   Active Radiation Treatment No  pt to begin after chemo   Past Radiation Treatment No     What other symptoms do you have   Are you Having Heaviness or Tightness No   Are you having Pain No   Are you having pitting edema No   Is it Hard or Difficult finding clothes that fit No   Do you have infections No   Is there Decreased scar mobility No         Objective measurements completed on examination: See above findings.          [facility] Adult PT Treatment/Exercise - 04/11/17 0001      Shoulder Exercises: Stretch   Corner Stretch 1 rep;30 seconds   Other Shoulder Stretches doorway stretch x 30 sec hold x 1 rep   Other Shoulder Stretches pec minor stretch with right anterior shoulder blocked by corner of wall raising arm up x 30 sec hold x 1 rep, also seated trunk stretch leaning towards left side with RUE raised turning to look over right shoulder                PT Education - 04/11/17 1205    Education provided Yes   Education Details lymphedema risk reduction practices, stretching exercises for pec and lateral trunk   Person(s) Educated Patient   Methods Explanation;Handout   Comprehension Verbalized understanding;Returned demonstration                [facility] Goals - 04/11/17 1214      CC Long Term Goal  #1   Title Pt to report a 75% decrease in left axillary tightness and pain with RUE  movement to improve comfort.   Time 4   Period Weeks   Status New     CC Long Term Goal  #2   Title Pt to be independent in a home exercise program for continued stretching and strengthening  Time 4   Period Weeks   Status New     CC Long Term Goal  #3   Title Pt to be able to independently verbalize lymphedema risk reduction strategies   Time 4   Period Weeks   Status New             Plan - 04/11/17 1206    Clinical Impression Statement Pt presents to PT following bilateral masectomies on 02/28/17. Pt had a SLNB on the right and 1 node was removed. Pt is very anxious today. The 1 node was found to be positive and she has been debating having a full node dissection. She has gotten several different opinions. Two opinions are no dissection but to complete radiation and chemo and the other opinion is to do the dissection. She is concerned because she has pain in her right axilla despite full AROM. She states this pain was present prior to surgery. She also has hidradentitis supprativa which caused sores in her armpit in the past leading to a lot of pain. There is palpable tightness in her right axilla and along her pec muscle. Educated pt in lymphedema risk reduction practices today. Pt would benefit from a few treatment sessions to educate her on pectoralis stretching and to decrease discomfort in axilla.    History and Personal Factors relevant to plan of care: right handed, hx of hidradentitis supprativa, pt very anxious about discomfort in axilla    Clinical Presentation Evolving   Clinical Presentation due to: pt to begin chemo followed by radiation   Clinical Decision Making Moderate   Rehab Potential Good   Clinical Impairments Affecting Rehab Potential pt to begin chemo   PT Frequency 1x / week   PT Duration 4 weeks   PT Treatment/Interventions ADLs/Self Care Home Management;Patient/family education;Therapeutic exercise;Manual lymph drainage;Scar mobilization;Manual  techniques;Passive range of motion   PT Next Visit Plan continue giving stretches for pec tightness, have pt lay over foam roll, measure circumferences for lymphedema, manual to right axilla   PT Home Exercise Plan corner stretch, doorway stretch, pec minor stretch, seated trunk stretch   Consulted and Agree with Plan of Care Patient      Patient will benefit from skilled therapeutic intervention in order to improve the following deficits and impairments:  Pain, Increased fascial restricitons  Visit Diagnosis: Stiffness of right shoulder, not elsewhere classified - Plan: PT plan of care cert/re-cert  Aftercare following surgery for neoplasm - Plan: PT plan of care cert/re-cert     Problem List Patient Active Problem List   Diagnosis Date Noted  . Genetic testing 02/21/2017  . Family history of prostate cancer   . Family history of stomach cancer   . Malignant neoplasm of upper-outer quadrant of right breast in female, estrogen receptor positive ([place]) 02/11/2017  . Hidradenitis suppurativa 08/26/2011  . HYPERLIPIDEMIA-MIXED 01/16/2010    [facility]ly[facility] Sabal [facility] 04/11/2017, 12:18 PM  [place], [place], [address] Phone: [phone]   Fax:  [phone]  Name: Katelyn Shelton MRN: [id number] Date of Birth: 1976-05-03  Manus Gunning, PT 04/11/17 12:19 PM  PHYSICAL THERAPY DISCHARGE SUMMARY  Visits from Start of Care: 1  Current functional level related to goals / functional outcomes: See above   Remaining deficits: See above   Education / Equipment: See above  Plan: Patient agrees to discharge.  Patient goals were not met. Patient is being discharged due to not returning since the last visit.  ?????  Allyson Sabal [place], [place] 11/21/17 10:19 AM

## 2017-04-11 NOTE — Progress Notes (Signed)
[place]  Telephone:(336) [phone] Fax:(336) [phone]     ID: Katelyn Shelton DOB: [birth year]  MR#: [id number]  QQI#:[id number]  Patient Care Team: Chipper Herb, MD as PCP - General (Family Medicine) Magrinat, Virgie Dad, MD as Consulting Physician (Oncology) Rolm Bookbinder, MD as Consulting Physician (General Surgery) Louretta Shorten, MD as Consulting Physician (Obstetrics and Gynecology) Juanita Craver Gerrie Nordmann, MD as Referring Physician (Hematology and Oncology) Kyung Rudd, MD as Consulting Physician (Radiation Oncology) Irene Limbo, MD as Consulting Physician (Plastic Surgery) Chauncey Cruel, MD OTHER MD:  CHIEF COMPLAINT: Estrogen receptor positive breast cancer  CURRENT TREATMENT: Adjuvant chemotherapy   INTERVAL HISTORY: Katelyn Shelton returns today for follow-up and treatment of her estrogen receptor positive breast cancer accompanied by her husband and father.  Since her last visit here this has met with the [facility] group and had further discussions with Micah Noel at [facility]. Do recommend that you late: Dr. Juanita Craver initially agreed on chemotherapy, but strongly suggested ovarian suppression and aromatase inhibitors as well as full axillary dissection; [facility] agreed with the ovarian suppression and aromatase inhibitor treatment, but also suggested chemotherapy and strongly recommended against axillary dissection. Katelyn Shelton is here today to try to reach a final decision  REVIEW OF SYSTEMS: We have talked on the phone other complications while she was seeking her additional opinions and also while she was at the beach. She has been very emotional. She did start venlafaxine about 10 days ago and then we opted dose about 4 days ago to 75 mg. So far she is having no side effects from that but she also does not think it is helping all that much. She did enjoy the beach but was unable to relax. She sees her children and things will she be around when they are 37 and 48 years older. She  tells me she would like some counseling. This is quite aside from the decision-making process. Aside from these issues a detailed review of systems today was noncontributory  BREAST CANCER HISTORY: From the original intake note:  "Katelyn Shelton" saw Dr. Corinna Capra for routine follow-up and was found to have a palpable mass in the upper outer quadrant of her right breast and was set up for bilateral diagnostic mammography with tomography and right breast ultrasonography at the [facility] 02/08/2017. The breast density was category C. In the upper outer quadrant of the right breast there was an irregular mass. There was also a 0.3 cm group of slightly heterogeneous calcifications in the retroareolar left breast. On exam there was indeed a firm palpable mass at the 10:00 position of the right breast 4 cm from the nipple. On ultrasound this measured 3.5 cm, it was irregular and hypoechoic. There was no abnormal right axillary adenopathy.  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 The area of left breast calcifications was biopsied 02/09/2017 and showed only fibrocystic changes. ([id number]).  Her subsequent history is as detailed below.  PAST MEDICAL HISTORY: Past Medical History:  Diagnosis Date  . Anxiety   . Breast cancer ([place]) 01/2017   right; genetic testing negative in 2018 with [facility] panel  . Depression   . History of MRSA infection    elbow    PAST SURGICAL HISTORY: Past Surgical History:  Procedure Laterality Date  . BREAST RECONSTRUCTION WITH PLACEMENT OF TISSUE  EXPANDER AND FLEX HD (ACELLULAR HYDRATED DERMIS) Bilateral 02/28/2017   Procedure: BILATERAL BREAST RECONSTRUCTION WITH PLACEMENT OF TISSUE EXPANDER AND ALLODERM;  Surgeon: Irene Limbo, MD;  Location: [facility];  Service: Plastics;  Laterality: Bilateral;  . CESAREAN SECTION     x 2 (3 total)  . CESAREAN SECTION  02/04/2012   Procedure: CESAREAN SECTION;  Surgeon: Cyril Mourning, MD;  Location: [facility];  Service: Gynecology;  Laterality: N/A;  Repeat Cesarean Section Delivery  Boy  @  [phone], Apgars 9/10  . DILATION AND CURETTAGE OF UTERUS    . EXCISION OF BREAST LESION Right 03/15/2017   Procedure: RIGHT NIPPLE AND AREOLA EXCISION;  Surgeon: Rolm Bookbinder, MD;  Location: [place];  Service: General;  Laterality: Right;  . NIPPLE SPARING MASTECTOMY Bilateral 02/28/2017   Procedure: RIGHT NIPPLE SPARING MASTECTOMY; LEFT PROPHYLACTIC NIPPLE SPARING MASTECTOMY;  Surgeon: Rolm Bookbinder, MD;  Location: [place];  Service: General;  Laterality: Bilateral;  . PECTUS EXCAVATUM REPAIR  1996  . SENTINEL NODE BIOPSY Right 02/28/2017   Procedure: RIGHT AXILLARY SENTINEL LYMPH  NODE BIOPSY;  Surgeon: Rolm Bookbinder, MD;  Location: [place];  Service: General;  Laterality: Right;  . VULVAR LESION REMOVAL    . WISDOM TOOTH EXTRACTION      FAMILY HISTORY Family History  Problem Relation Age of Onset  . Skin cancer Mother   . Parkinson's disease Sister 58  . Diabetes Maternal Aunt   . Heart failure Maternal Grandmother   . Heart attack Maternal Grandfather   . Stomach cancer Paternal Grandmother   . Prostate cancer Paternal Grandfather   . Alzheimer's disease Paternal Grandfather   . Breast cancer Cousin        mother's paternal first FEMALE cousin   The patient's father, Carolyne Fiscal, is an Administrator, Civil Service, currently 40 years old. The patient's mother is 27 years old as of April 2018. Patient has no brothers, 2 sisters. There is a history breast cancer in the family in a great aunt on the patient's father's side. On the maternal side there is a female first cousin of the patient's mother with breast cancer. There is no history of ovarian cancer  in the family  GYNECOLOGIC HISTORY:  No LMP recorded. Katelyn Shelton had her first menstrual period age 48, her first live birth age 30. She has had 3 C-sections. Her periods are approximately every 21 days, with one heavy day. Her husband is status post vasectomy.  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 Her husband [name] is one of our cardiologists. At that patient attends [employer]:    HEALTH MAINTENANCE: Social History  Substance Use Topics  . Smoking status: Never Smoker  . Smokeless tobacco: Never Used  . Alcohol use No     Colonoscopy:  PAP:  Bone density:   Allergies  Allergen Reactions  . [place]ach Flavor Anaphylaxis  . Fentanyl Itching  . Penicillins Hives    AS A CHILD  . Sulfa Antibiotics Nausea And Vomiting  . Latex Itching    Current Outpatient Prescriptions  Medication Sig Dispense Refill  . acetaminophen (TYLENOL) 325 MG tablet Take 650 mg by mouth every 6 (six) hours as needed.    [name] dexamethasone (DECADRON) 4 MG tablet Take 2 tablets (8 mg total) by mouth 2 (two) times daily. Start the day before Taxotere. Then again the  day after chemo for 3 days. 30 tablet 1  . fluticasone (FLONASE) 50 MCG/ACT nasal spray Place 1 spray into both nostrils daily as needed.    [name] ibuprofen (ADVIL,MOTRIN) 200 MG tablet Take 200 mg by mouth every 6 (six) hours as needed.    [name] LORazepam (ATIVAN) 0.5 MG tablet TAKE 1/2 TO 1 TABLET BY MOUTH AT BEDTIME AS NEEDED FOR ANXIETY 30 tablet 3  . LORazepam (ATIVAN) 0.5 MG tablet Take 1 tablet (0.5 mg total) by mouth at bedtime as needed (Nausea or vomiting). 30 tablet 0  . ondansetron (ZOFRAN) 8 MG tablet Take 1 tablet (8 mg total) by mouth 2 (two) times daily as needed for refractory nausea / vomiting. Start on day 3 after chemo. 30 tablet 1  . prochlorperazine (COMPAZINE) 10 MG tablet Take 1 tablet (10 mg total) by mouth every 6 (six) hours as  needed (Nausea or vomiting). 30 tablet 1  . tamoxifen (NOLVADEX) 10 MG tablet Take 10 mg by mouth 2 (two) times daily.    [name] venlafaxine XR (EFFEXOR-XR) 75 MG 24 hr capsule Take 1 capsule (75 mg total) by mouth daily with breakfast. 90 capsule 3   No current facility-administered medications for this visit.     OBJECTIVE: Young white womanWho appears well  There were no vitals filed for this visit.   There is no height or weight on file to calculate BMI.    ECOG FS:1 - Symptomatic but completely ambulatory  LAB RESULTS:  CMP     Component Value Date/Time   NA 136 07/12/2014 0851   K 4.2 07/12/2014 0851   CL 99 07/12/2014 0851   CO2 20 07/12/2014 0851   GLUCOSE 80 07/12/2014 0851   GLUCOSE 88 12/21/2013 1226   BUN 15 07/12/2014 0851   CREATININE 0.68 07/12/2014 0851   CALCIUM 8.8 07/12/2014 0851   PROT 7.1 07/12/2014 0851   ALBUMIN 4.6 07/12/2014 0851   AST 21 07/12/2014 0851   ALT 17 07/12/2014 0851   ALKPHOS 40 07/12/2014 0851   BILITOT 0.8 07/12/2014 0851   GFRNONAA 112 07/12/2014 0851   GFRAA 129 07/12/2014 0851    No results found for: TOTALPROTELP, ALBUMINELP, A1GS, A2GS, BETS, BETA2SER, GAMS, MSPIKE, SPEI  No results found for: Nils Pyle, [facility]  Lab Results  Component Value Date   WBC 5.9 07/12/2014   NEUTROABS 5.4 12/21/2013   HGB 13.0 03/15/2017   HCT 40.8 07/12/2014   MCV 93.2 07/12/2014   PLT 202 12/21/2013      Chemistry      Component Value Date/Time   NA 136 07/12/2014 0851   K 4.2 07/12/2014 0851   CL 99 07/12/2014 0851   CO2 20 07/12/2014 0851   BUN 15 07/12/2014 0851   CREATININE 0.68 07/12/2014 0851      Component Value Date/Time   CALCIUM 8.8 07/12/2014 0851   ALKPHOS 40 07/12/2014 0851   AST 21 07/12/2014 0851   ALT 17 07/12/2014 0851   BILITOT 0.8 07/12/2014 0851       No results found for: LABCA2  No components found for: [id number]  No results for input(s): INR in the last 168 hours.  Urinalysis      Component Value Date/Time   COLORURINE YELLOW 08/26/2011 [place] 08/26/2011 1652   LABSPEC 1.025 12/21/2013 1226   PHURINE 6.0 12/21/2013 1226   GLUCOSEU NEGATIVE 12/21/2013 1226   HGBUR TRACE (A) 12/21/2013 1226   BILIRUBINUR neg 07/12/2014 0913   KETONESUR NEGATIVE  12/21/2013 1226   PROTEINUR 4+ 07/12/2014 0913   PROTEINUR NEGATIVE 12/21/2013 1226   UROBILINOGEN negative 07/12/2014 0913   UROBILINOGEN 0.2 12/21/2013 1226   NITRITE neg 07/12/2014 0913   NITRITE NEGATIVE 12/21/2013 1226   LEUKOCYTESUR small (1+) 07/12/2014 0913     STUDIES: No results found.  ELIGIBLE FOR AVAILABLE RESEARCH PROTOCOL: no  ASSESSMENT: 41 y.o. [place] woman status post right breast upper outer quadrant biopsy 02/08/2017 a clinical T2 N0, stage IB invasive ductal carcinoma, grade 2, estrogen and progesterone receptor positive, HER-2 nonamplified, with an MIB-1 of 5%.  (a) biopsy of upper outer quadrant calcifications in the left breast 02/09/2017 were benign  (1) Oncotype DX obtained from the biopsy sample showed a recurrence score of 11 predicting a 10 year risk of recurrence outside the breast of 9% if the patient's only systemic therapy is tamoxifen for 5 years (node positive report)  (2) bilateral mastectomies with right sentinel lymph node sampling 02/28/2017 showed  (a) on the left, intraductal papilloma, with no evidence of malignancy.  (b) on the right, a pT2 pN1 stage IIA invasive ductal carcinoma, grade 1, with close margins  (c) additional excision of the right nipple areolar area 03/15/2017 found no residual carcinoma  (3) Mammaprint study on the final surgical sample came back low risk, predicting a five-year distant disease-free survival of 97.8% with hormone therapy alone   (3) tamoxifen started neoadjuvantly 02/11/2017, Stopped 04/11/2017 in preparation for chemotherapy  (4) genetics counseling 02/19/2017 showed no deleterious mutations in the STAT gene panel  offered by [facility] including ATM, BRCA1, BRCA2, CDH1, CHEK2, PALB2, PTEN, STK11, and TP53.  (a) multi gene panel results pending  (5) adjuvant chemotherapy to consist on cyclophosphamide and docetaxel given every 21 days 4 to start 04/15/2017  (6) adjuvant radiation to follow chemotherapy  (7) aromatase inhibitor anti-estrogen therapy to follow with or without ovarian suppression   PLAN: Katelyn Shelton has gone through a lot since she had her diagnosis but at this point she is fairly ready to make a decision. Their visit at [facility] continues to trouble her, and she wonders if she should have a full axillary dissection. The visit at [facility] however was pivotal in that she has decided to go for adjuvant chemotherapy and specifically what was suggested by Dr. Alinda Money and the breast cancer group there, namely cyclophosphamide and docetaxel 4.  Dr. Alinda Money called me to discuss this case and 8, and he made really did clarify the issue for me. He said "we have too much information". The fact is many of [place] are bending over backwards so far to avoid over treating patients that we run the risk of under treating them. In this case a strategy that maximizes her chances of long-term survival, even if it means some overtreatment, is reasonable on many counts. First of all it will avoid regrets in the future and this is something that weighs very heavily with her. Second since all cancers have different clones within them, the chemotherapy should help eliminate the more aggressive clones. She understands chemotherapy is not likely to be effective in the majority of her cancer which is the reason she will need long-term anti-estrogens.  And finally there is a 40% or so chance of the chemotherapy will leave her in permanent menopause. This would spare her 5 years of monthly rather painful Zoladex shots or BSO surgery  I think this strategy, of maximizing her chances of survival, was what was in Katelyn Shelton's mind from the start and I  apologized to her for not helping her realize this and not listening more closely to what she may have really been saying.  At this point the only question she has is whether she wants to try the cold. We have not started is here in [place] because I find it cumbersome painful and to give mediocre results, but she is interested and she may go to [facility] to see treatments there including the cold. She has an appointment later this week with their group.  Impression preparation for her starting chemotherapy there or here, I would today reviewed the possible side effects, toxicities and complications of the TC regimen including the possibility of permanent alopecia and peripheral neuropathy. She is not interested in fertility preservation and in fact we are hopeful she will enter permanent menopause with these treatments.  I have tentatively set her up to start therapy 04/15/2017 and to see me a week later.  She knows to call for any problems that may develop before her next visit here.    Chauncey Cruel, MD   04/11/2017 7:31 PM Medical Oncology and Hematology [facility] [address] [place], [place] [address] Tel. [phone]    Fax. [phone]

## 2017-04-11 NOTE — Patient Instructions (Signed)
Flexibility: Corner Stretch    Standing in corner with hands just above shoulder level and feet _10-12___ inches from corner, lean forward until a comfortable stretch is felt across chest. Hold _30-60___ seconds. Repeat _5___ times per set. Do _1___ sets per session. Do __2__ sessions per day.  http://orth.exer.us/343   Copyright  VHI. All rights reserved.  CHEST: Doorway, Bilateral - Standing    Standing in doorway, place hands on wall with elbows bent at shoulder height. Lean forward. Hold _30-60__ seconds. _5__ reps per set, _1__ sets per day, _7__ days per week  Copyright  VHI. All rights reserved.   Trunk Stretch: Sitting in chair, leaning to left side raising right arm and turning to look over right shoulder hold 30-60 seconds. Do 5 reps per set x 1 set per day x 7 days per week.

## 2017-04-11 NOTE — Progress Notes (Signed)
START OFF PATHWAY REGIMEN - Breast   [id number]:Docetaxel + Cyclophosphamide (TC):   A cycle is every 21 days:     Docetaxel      Cyclophosphamide   **Always confirm dose/schedule in your pharmacy ordering system**    Patient Characteristics: Postoperative without Neoadjuvant Therapy (Pathologic Staging), Invasive Disease, Adjuvant Therapy, Node Positive (1-3), HER2 Negative/Unknown/Equivocal, ER Positive, Oncotyping Ordered, Oncotype Low Risk (< 18), Premenopausal Therapeutic Status: Postoperative without Neoadjuvant Therapy (Pathologic Staging) AJCC Grade: G1 AJCC N Category: pN1 AJCC M Category: cM0 ER Status: Positive (+) AJCC 8 Stage Grouping: IA HER2 Status: Negative (-) Oncotype Dx Recurrence Score: 11 AJCC T Category: pT2 PR Status: Positive (+) Has this patient completed genomic testing? Yes - Oncotype DX(R) Menopausal Status: Premenopausal Intent of Therapy: Curative Intent, Discussed with Patient

## 2017-04-11 NOTE — Telephone Encounter (Signed)
Pt called asking about cold caps to use during chemotherapy. Informed pt that [facility] does not have cold caps and that I would call [facility] to see if they cold caps and if not that I knew [facility] does have them.  Left 2 msgs for [facility] to return call to discuss plan for chemo and cold cap. [facility]- was given an appt to see Dr. Remo Lipps Sorscher on 04/13/17 at 12:45 to establish care and could possibly start chemo the beginning of week 6/25. Called pt with this information. Pt gave me number of the nurse navigator she has been working with at [facility]. Left msg for Amy DePue to return call to discuss plan. Contact information provided.

## 2017-04-12 ENCOUNTER — Telehealth: Payer: Self-pay | Admitting: Oncology

## 2017-04-12 ENCOUNTER — Telehealth: Payer: Self-pay | Admitting: *Deleted

## 2017-04-12 NOTE — Telephone Encounter (Signed)
Pt called to inform she has decided to have chemo at [facility]. Discussed appt with Dr. Mardelle Matte on 6/20 at 1245. Gave pt directions and information given to me. Called [facility] and spoke to [place], [place] with information on pt decision to go there for chemo and cold cap. Discussed if possible pt would like to start chemo on Monday.

## 2017-04-12 NOTE — Telephone Encounter (Signed)
Called pt to inform of upcoming appts per 6/18 los. Pt stated she will start her treatment at [facility]. appts cxled

## 2017-04-13 ENCOUNTER — Telehealth: Payer: Self-pay | Admitting: *Deleted

## 2017-04-13 ENCOUNTER — Other Ambulatory Visit: Payer: Self-pay

## 2017-04-13 DIAGNOSIS — C50411 Malignant neoplasm of upper-outer quadrant of right female breast: Secondary | ICD-10-CM | POA: Diagnosis not present

## 2017-04-13 DIAGNOSIS — Z170 Estrogen receptor positive status [ER+]: Secondary | ICD-10-CM | POA: Diagnosis not present

## 2017-04-13 NOTE — Telephone Encounter (Signed)
Spoke to pt briefly. Informed we can do weekly labs and fax to [place]. Pt expected to start chemo at [facility] on Monday 6/25. Pt will call and verify start date and labs to be drawn and faxed to [facility].

## 2017-04-15 ENCOUNTER — Other Ambulatory Visit: Payer: Self-pay

## 2017-04-15 ENCOUNTER — Ambulatory Visit: Payer: Self-pay

## 2017-04-18 ENCOUNTER — Telehealth: Payer: Self-pay | Admitting: *Deleted

## 2017-04-18 DIAGNOSIS — Z170 Estrogen receptor positive status [ER+]: Secondary | ICD-10-CM | POA: Diagnosis not present

## 2017-04-18 DIAGNOSIS — Z5111 Encounter for antineoplastic chemotherapy: Secondary | ICD-10-CM | POA: Diagnosis not present

## 2017-04-18 DIAGNOSIS — C50411 Malignant neoplasm of upper-outer quadrant of right female breast: Secondary | ICD-10-CM | POA: Diagnosis not present

## 2017-04-18 NOTE — Telephone Encounter (Signed)
Left vm to check in with pt after receiving her 1st cycle of TC at [facility]. Request return call if pt has needs or questions.

## 2017-04-22 ENCOUNTER — Ambulatory Visit: Payer: Self-pay | Admitting: Oncology

## 2017-04-22 ENCOUNTER — Other Ambulatory Visit: Payer: Self-pay

## 2017-04-26 ENCOUNTER — Telehealth: Payer: Self-pay | Admitting: Genetics

## 2017-04-26 NOTE — Telephone Encounter (Signed)
Patient left me a voicemail on 04/25/2017 stating that she underwent genetic testing with [facility]ayser in May 2018. Since Santiago Glad is out this week on vacation, the patient called me for assistance. Recently, the patient received a form from her insurance company with multiple questions regarding her genetic testing. The patient would like some help answering these questions.  I left [name] a message advising her to either fax or email the form to me if possible so that I can see what information is needed and we can make a plan from there. I left my phone, fax, and email information in the message.

## 2017-05-03 DIAGNOSIS — N6012 Diffuse cystic mastopathy of left breast: Secondary | ICD-10-CM | POA: Diagnosis not present

## 2017-05-03 DIAGNOSIS — C50011 Malignant neoplasm of nipple and areola, right female breast: Secondary | ICD-10-CM | POA: Diagnosis not present

## 2017-05-03 DIAGNOSIS — D0501 Lobular carcinoma in situ of right breast: Secondary | ICD-10-CM | POA: Diagnosis not present

## 2017-05-03 DIAGNOSIS — C773 Secondary and unspecified malignant neoplasm of axilla and upper limb lymph nodes: Secondary | ICD-10-CM | POA: Diagnosis not present

## 2017-05-03 DIAGNOSIS — D242 Benign neoplasm of left breast: Secondary | ICD-10-CM | POA: Diagnosis not present

## 2017-05-06 ENCOUNTER — Other Ambulatory Visit: Payer: Self-pay

## 2017-05-06 ENCOUNTER — Ambulatory Visit: Payer: Self-pay

## 2017-05-09 ENCOUNTER — Ambulatory Visit: Payer: Self-pay | Admitting: Oncology

## 2017-05-09 DIAGNOSIS — C50411 Malignant neoplasm of upper-outer quadrant of right female breast: Secondary | ICD-10-CM | POA: Diagnosis not present

## 2017-05-09 DIAGNOSIS — Z5111 Encounter for antineoplastic chemotherapy: Secondary | ICD-10-CM | POA: Diagnosis not present

## 2017-05-09 DIAGNOSIS — Z170 Estrogen receptor positive status [ER+]: Secondary | ICD-10-CM | POA: Diagnosis not present

## 2017-05-11 ENCOUNTER — Other Ambulatory Visit: Payer: Self-pay | Admitting: Oncology

## 2017-05-12 ENCOUNTER — Telehealth: Payer: Self-pay | Admitting: Gynecologic Oncology

## 2017-05-12 DIAGNOSIS — C50411 Malignant neoplasm of upper-outer quadrant of right female breast: Secondary | ICD-10-CM | POA: Diagnosis not present

## 2017-05-12 DIAGNOSIS — Z170 Estrogen receptor positive status [ER+]: Secondary | ICD-10-CM | POA: Diagnosis not present

## 2017-05-12 NOTE — Telephone Encounter (Signed)
Left message for patient asking her to please call the office to arrange an appointment with Dr. Denman George per referral from Dr. Donne Hazel for possible oophorectomy for hormone receptor positive breast cancer.  Advised her to please contact the office.

## 2017-05-17 MED FILL — VENLAFAXINE HCL ER 75 MG CA: 75 | 90 days supply | Qty: 90 | Fill #0

## 2017-05-20 ENCOUNTER — Encounter: Payer: Self-pay | Admitting: Gastroenterology

## 2017-05-20 ENCOUNTER — Telehealth: Payer: Self-pay

## 2017-05-20 NOTE — Telephone Encounter (Signed)
-----   Message from Ladene Artist, MD sent at 05/20/2017  4:40 PM EDT ----- Regarding: RE: Colonoscopy, high risk patient Barbera Setters, Please call her and find a time in Sept that works for her. We can use a 0730 [facility] time slot if no other options however please check with me to be sure there is no meeting or other schedule conflict. Thanks, MS   ----- Message ----- From: Jerene Bears, MD Sent: 05/20/2017   4:32 PM To: Marlon Pel, RN, Ladene Artist, MD Subject: Colonoscopy, high risk patient                 Norberto Sorenson, This is the patient we discussed for high risk colon cancer screening. She goes by [facility].  Azarya Oconnell, This pt needs to be contacted to discuss colonoscopy.  She has a family history of colon polyps in a sibling. She is being treated for breast cancer and would like to have this procedure sometime in September and between her chemotherapy and radiation treatments. I have discussed this patient with Norberto Sorenson already. This is Dr. [name] Vantol's wife.  Thanks to all [facility]

## 2017-05-20 NOTE — Telephone Encounter (Signed)
Patient has been scheduled for 07/11/17 7:30 am and pre-visit 05/04/17 8:30.  She is aware of the appt dates and times.

## 2017-05-27 ENCOUNTER — Ambulatory Visit: Payer: Self-pay

## 2017-05-27 ENCOUNTER — Other Ambulatory Visit: Payer: Self-pay

## 2017-05-30 DIAGNOSIS — Z170 Estrogen receptor positive status [ER+]: Secondary | ICD-10-CM | POA: Diagnosis not present

## 2017-05-30 DIAGNOSIS — C50411 Malignant neoplasm of upper-outer quadrant of right female breast: Secondary | ICD-10-CM | POA: Diagnosis not present

## 2017-06-07 ENCOUNTER — Other Ambulatory Visit: Payer: Self-pay | Admitting: Oncology

## 2017-06-07 ENCOUNTER — Encounter: Payer: Self-pay | Admitting: Oncology

## 2017-06-07 DIAGNOSIS — Z170 Estrogen receptor positive status [ER+]: Principal | ICD-10-CM

## 2017-06-07 DIAGNOSIS — C50411 Malignant neoplasm of upper-outer quadrant of right female breast: Secondary | ICD-10-CM

## 2017-06-08 ENCOUNTER — Encounter: Payer: Self-pay | Admitting: Radiation Oncology

## 2017-06-14 DIAGNOSIS — L718 Other rosacea: Secondary | ICD-10-CM | POA: Diagnosis not present

## 2017-06-14 DIAGNOSIS — L218 Other seborrheic dermatitis: Secondary | ICD-10-CM | POA: Diagnosis not present

## 2017-06-14 DIAGNOSIS — L700 Acne vulgaris: Secondary | ICD-10-CM | POA: Diagnosis not present

## 2017-06-14 DIAGNOSIS — L738 Other specified follicular disorders: Secondary | ICD-10-CM | POA: Diagnosis not present

## 2017-06-17 ENCOUNTER — Other Ambulatory Visit: Payer: Self-pay

## 2017-06-17 ENCOUNTER — Ambulatory Visit: Payer: Self-pay

## 2017-06-20 DIAGNOSIS — C50411 Malignant neoplasm of upper-outer quadrant of right female breast: Secondary | ICD-10-CM | POA: Diagnosis not present

## 2017-06-20 DIAGNOSIS — Z5111 Encounter for antineoplastic chemotherapy: Secondary | ICD-10-CM | POA: Diagnosis not present

## 2017-06-20 DIAGNOSIS — Z170 Estrogen receptor positive status [ER+]: Secondary | ICD-10-CM | POA: Diagnosis not present

## 2017-06-20 DIAGNOSIS — Z79899 Other long term (current) drug therapy: Secondary | ICD-10-CM | POA: Diagnosis not present

## 2017-07-04 DIAGNOSIS — C50911 Malignant neoplasm of unspecified site of right female breast: Secondary | ICD-10-CM | POA: Diagnosis not present

## 2017-07-04 DIAGNOSIS — Z170 Estrogen receptor positive status [ER+]: Secondary | ICD-10-CM | POA: Diagnosis not present

## 2017-07-05 ENCOUNTER — Telehealth: Payer: Self-pay | Admitting: *Deleted

## 2017-07-05 DIAGNOSIS — C50411 Malignant neoplasm of upper-outer quadrant of right female breast: Secondary | ICD-10-CM | POA: Diagnosis not present

## 2017-07-05 DIAGNOSIS — Z170 Estrogen receptor positive status [ER+]: Secondary | ICD-10-CM | POA: Diagnosis not present

## 2017-07-05 NOTE — Telephone Encounter (Signed)
Patient called and moved her appt from September 17th to November 5th

## 2017-07-08 ENCOUNTER — Other Ambulatory Visit: Payer: Self-pay

## 2017-07-08 ENCOUNTER — Ambulatory Visit: Payer: Self-pay

## 2017-07-08 ENCOUNTER — Encounter: Payer: Self-pay | Admitting: Radiation Oncology

## 2017-07-11 ENCOUNTER — Ambulatory Visit: Payer: Self-pay | Admitting: Gynecologic Oncology

## 2017-07-11 DIAGNOSIS — Z170 Estrogen receptor positive status [ER+]: Secondary | ICD-10-CM | POA: Diagnosis not present

## 2017-07-11 DIAGNOSIS — Z5111 Encounter for antineoplastic chemotherapy: Secondary | ICD-10-CM | POA: Diagnosis not present

## 2017-07-11 DIAGNOSIS — C50411 Malignant neoplasm of upper-outer quadrant of right female breast: Secondary | ICD-10-CM | POA: Diagnosis not present

## 2017-07-12 ENCOUNTER — Ambulatory Visit: Payer: 59 | Admitting: Radiation Oncology

## 2017-07-12 ENCOUNTER — Inpatient Hospital Stay
Admission: RE | Admit: 2017-07-12 | Discharge: 2017-07-12 | Disposition: A | Discharge status: Home / Self Care | Payer: Self-pay | Source: Ambulatory Visit | Attending: Radiation Oncology | Admitting: Radiation Oncology

## 2017-07-13 ENCOUNTER — Encounter: Payer: Self-pay | Admitting: Gastroenterology

## 2017-07-14 ENCOUNTER — Other Ambulatory Visit: Payer: Self-pay | Admitting: *Deleted

## 2017-07-14 ENCOUNTER — Other Ambulatory Visit: Payer: Self-pay | Admitting: Oncology

## 2017-07-14 MED ORDER — ROSUVASTATIN CALCIUM 5 MG PO TABS: 5.0000 mg | ORAL_TABLET | Freq: Every day | ORAL | 3 refills | Status: DC

## 2017-07-14 MED FILL — ROSUVASTATIN CALCIUM 5 MG T: 5 | 90 days supply | Qty: 90 | Fill #0

## 2017-07-22 DIAGNOSIS — E282 Polycystic ovarian syndrome: Secondary | ICD-10-CM | POA: Diagnosis not present

## 2017-07-22 DIAGNOSIS — Z170 Estrogen receptor positive status [ER+]: Secondary | ICD-10-CM | POA: Diagnosis not present

## 2017-07-22 DIAGNOSIS — C50411 Malignant neoplasm of upper-outer quadrant of right female breast: Secondary | ICD-10-CM | POA: Diagnosis not present

## 2017-08-03 DIAGNOSIS — C50411 Malignant neoplasm of upper-outer quadrant of right female breast: Secondary | ICD-10-CM | POA: Diagnosis not present

## 2017-08-03 DIAGNOSIS — Z5111 Encounter for antineoplastic chemotherapy: Secondary | ICD-10-CM | POA: Diagnosis not present

## 2017-08-03 DIAGNOSIS — Z170 Estrogen receptor positive status [ER+]: Secondary | ICD-10-CM | POA: Diagnosis not present

## 2017-08-03 NOTE — Progress Notes (Signed)
Location of Breast Cancer: Upper outer quadrant right female breast  FUN had Breast, Nipple and Areola, excision Right 03-15-17 by Dr. Rolm Shelton  Histology per Pathology Report:  Diagnosis 03-15-17 Katelyn Shelton. Breast, Nipple and Areola, excision Right - BENIGN SKIN AND NIPPLE WITH BENIGN SUBAREOLAR BREAST TISSUE. - PREVIOUS BIOPSY SITE CHANGES. - NO RESIDUAL CARCINOMA IDENTIFIED. - FINAL ANTERIOR MARGIN CLEAR.   Diagnosis 02-28-17 Katelyn Shelton 1. Breast, simple mastectomy, Left Nipple Sparing - INTRADUCTAL PAPILLOMA WITH USUAL DUCTAL HYPERPLASIA. - FIBROCYSTIC CHANGES WITH CALCIFICATIONS. - HEALING BIOPSY SITE. - THERE IS NO EVIDENCE OF MALIGNANCY. 2. Breast, excision, Left Retroareolar - FIBROCYSTIC CHANGES. - THERE IS NO EVIDENCE OF MALIGNANCY. 3. Nipple Biopsy, Left - BENIGN LACTIFEROUS DUCTS. - THERE IS NO EVIDENCE OF MALIGNANCY. 4. Breast, simple mastectomy, Right Nipple Sparing - INVASIVE DUCTAL CARCINOMA, GRADE I/III, SPANNING AT LEAST 4.0 CM. - DUCTAL CARCINOMA IN SITU WITH CALCIFICATIONS, INTERMEDIATE GRADE. - PERINEURAL INVASION IS IDENTIFIED. - LYMPHOVASCULAR INVASION IS IDENTIFIED. - INVASIVE AND IN SITU DUCTAL CARCINOMA ARE BROADLY LESS THAN 0.1 CM TO THE ANTERIOR MARGIN OF SPECIMEN #4. - SEE ONCOLOGY TABLE BELOW. 5. Breast, excision, Right Retroareolar - INVASIVE DUCTAL CARCINOMA, GRADE I/III, SPANNING AT LEAST 0.9 CM. - DUCTAL CARCINOMA IN SITU, INTERMEDIATE GRADE. - LYMPHOVASCULAR INVASION IS IDENTIFIED. - INVASIVE AND IN SITU DUCTAL CARCINOMA ARE BROADLY LESS THAN 0.1 CM TO THE ANTERIOR MARGIN OF SPECIMEN #5. 6. Nipple Biopsy, Right - INVASIVE DUCTAL CARCINOMA, GRADE I/III, SPANNING 0.1 CM. - LYMPHOVASCULAR INVASION IS IDENTIFIED. 7. Lymph node, sentinel, biopsy, Right Axillary - METASTATIC CARCINOMA IN 1 OF 1 LYMPH NODE (1/1) . Receptor Status: ER(, 90%+)PR ( 90%+), Her2-neu (neg), Ki-67(5%)  Diagnosis 02/08/2017: Dr.  Irene Limbo MD,  Breast, right, needle core biopsy, 10 o'clock - INVASIVE DUCTAL CARCINOMA. - DUCTAL CARCINOMA IN SITU WITH NECROSIS AND CALCIFICATIONS. Diagnosis 02/09/17: Breast, left, needle core biopsy, upper outer quadrant - FIBROCYSTIC CHANGES WITH MICROCALCIFICATIONS. - PSEUDOANGIOMATOUS STROMAL HYPERPLASIA ([place]). - THERE IS NO EVIDENCE OF MALIGNANCY  Receptor Status: ER(, 90%+)PR ( 90%+), Her2-neu (neg), Ki-67(5%)  Did patient present with symptoms (if so, please note symptoms) or was this found on screening mammography?: They found it on a manual exam at a well check, She was then sent for a mammogram.  Past/Anticipated interventions by surgeon, if any 03-15-17 Katelyn Shelton, Nipple and Areola, excision Right , M.D 05-07-8. Breast, simple mastectomy, Left Nipple Sparing, 02-09-17 Breast, left, needle core biopsy, upper outer quadrant,02-08-17  Breast, right, needle core biopsy                Past/Anticipated interventions by medical oncology, if any: Chemotherapy : Dr. Jana Hakim, MD , he has prescribed Tamoxifen,Oncotype,Mammaprint study,02-14-17 genetic testing Negative Lymphedema issues, if any: She denies. She has difficulty raising her Right arm.   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

## 2017-08-09 ENCOUNTER — Ambulatory Visit
Admission: RE | Admit: 2017-08-09 | Discharge: 2017-08-09 | Disposition: A | Discharge status: Home / Self Care | Payer: 59 | Source: Ambulatory Visit | Attending: Radiation Oncology | Admitting: Radiation Oncology

## 2017-08-09 ENCOUNTER — Encounter: Payer: Self-pay | Admitting: Radiation Oncology

## 2017-08-09 ENCOUNTER — Other Ambulatory Visit: Payer: Self-pay | Admitting: Oncology

## 2017-08-09 VITALS — BP 101/50 | HR 84 | Temp 98.3°F | Resp 18 | Ht <= 58 in | Wt 125.6 lb

## 2017-08-09 DIAGNOSIS — C50411 Malignant neoplasm of upper-outer quadrant of right female breast: Secondary | ICD-10-CM

## 2017-08-09 DIAGNOSIS — F329 Major depressive disorder, single episode, unspecified: Secondary | ICD-10-CM | POA: Insufficient documentation

## 2017-08-09 DIAGNOSIS — Z9013 Acquired absence of bilateral breasts and nipples: Secondary | ICD-10-CM | POA: Insufficient documentation

## 2017-08-09 DIAGNOSIS — Z170 Estrogen receptor positive status [ER+]: Principal | ICD-10-CM

## 2017-08-09 DIAGNOSIS — Z880 Allergy status to penicillin: Secondary | ICD-10-CM | POA: Insufficient documentation

## 2017-08-09 DIAGNOSIS — F419 Anxiety disorder, unspecified: Secondary | ICD-10-CM | POA: Diagnosis not present

## 2017-08-09 DIAGNOSIS — Z8614 Personal history of Methicillin resistant Staphylococcus aureus infection: Secondary | ICD-10-CM | POA: Diagnosis not present

## 2017-08-09 DIAGNOSIS — Z9221 Personal history of antineoplastic chemotherapy: Secondary | ICD-10-CM | POA: Diagnosis not present

## 2017-08-09 NOTE — Progress Notes (Signed)
Katelyn Shelton has completed her chemotherapy and is ready to start radiation. She is interested on goserelin. I am obtaining basic lab work when she returns to see me the 26 and she can have her first goserelin dose the same day.

## 2017-08-09 NOTE — Progress Notes (Addendum)
Radiation Oncology         [phone] ________________________________  Name: Katelyn Shelton MRN: [id number]  Date: 08/09/2017  DOB: [birth year]  CC:Chipper Herb, MD  Magrinat, Virgie Dad, MD     REFERRING PHYSICIAN: Magrinat, Virgie Dad, MD   DIAGNOSIS: The primary encounter diagnosis was Malignant neoplasm of upper-outer quadrant of right breast in female, estrogen receptor positive ([place]). A diagnosis of Malignant neoplasm of upper-outer quadrant of right breast in female, estrogen receptor positive ([place]) was also pertinent to this visit.   HISTORY OF PRESENT ILLNESS: Katelyn Shelton is a 41 y.o. female with a recent history of right breast cancer. Katelyn Shelton was found to have a palpable mass in the right upper outer quadrant of the right breast and was sent for diagnostic mammogram in 02/08/17. This revealed a 3 mm group of heterogenous calcifications in the retroareolar left breast and scattered left breast calcifications and an irregular mass in the upper outer right breast. An ultrasound revealed a 4 x 1.6 x 3.5 cm mass in the right breast corresponding to the mammography finding. No adenopathy was noted in the axilla. She had a biopsy of the right breast revealing grade 2 invasive ductal carcinoma and DCIS with calcifications and necrosis, ER/PR positive, HER2 negative, Ki67 was 5%. The left breast calcifications were biopsied as well and revealed PASH and fibrocystic changes. An MRI of the breast was pursued for surgical planning given prior chest wall surgery and on 02/16/17 revealed the right upper outer quadrant mass at 2.1 x 3.5 x 3.5 cm with two satellite nodules each measuring 5 mm with similar enhancement. The left breast did not have any enhancement and no suspicious appearing adenopathy was noted. Her retropectoral suprasternal midline implant from prior surgery was noted. She elected to undergo bilateral mastectomies with right sentinel node assessment and bilateral reconstruction with  tissue expanders. Final pathology revealed no malignancy in the left breast, retroareolar tissue, or nipple. The right breast revealed a grade 2, 4 cm tumor with IDC, DCIS, and LVSI and perineural invasion her sentinel node which was removed was positive for metastatic disease, and her right nipple biopsy was also positive for 1 mm of IDC with LVSI.   Mammaprint was also performed and revealed low risk features. She does not have an indication at this time for chemotherapy. She has undergone genetic testing which was negative as well. She underwent re-resection on 03/15/17 revealing clear margins and no residual cancer. She sought second opinion with [facility] regarding systemic therapy with chemo, and settled on adjuvant chemotherapy with the medical oncology department under the care of Dr. Shary Decamp with [place] to prevent chemo induced alopecia. She began treatment in June 2018 of Taxotere and Cytoxan and completed her 6th cycle of this on 08/03/17. She comes today to discuss options of radiotherapy in the adjuvant setting.  PREVIOUS RADIATION THERAPY: No   PAST MEDICAL HISTORY:  Past Medical History:  Diagnosis Date  . Anxiety   . Breast cancer ([place]) 01/2017   right; genetic testing negative in 2018 with [employer] panel  . Depression   . History of MRSA infection    elbow       PAST SURGICAL HISTORY: Past Surgical History:  Procedure Laterality Date  . BREAST RECONSTRUCTION WITH PLACEMENT OF TISSUE EXPANDER AND FLEX HD (ACELLULAR HYDRATED DERMIS) Bilateral 02/28/2017   Procedure: BILATERAL BREAST RECONSTRUCTION WITH PLACEMENT OF TISSUE EXPANDER AND ALLODERM;  Surgeon: Irene Limbo, MD;  Location: [facility];  Service: Clinical cytogeneticist;  Laterality: Bilateral;  . CESAREAN SECTION     x 2 (3 total)  . CESAREAN SECTION  02/04/2012   Procedure: CESAREAN SECTION;  Surgeon: Cyril Mourning, MD;  Location: [facility];  Service: Gynecology;  Laterality: N/A;  Repeat Cesarean Section  Delivery  Boy  @  [phone], Apgars 9/10  . DILATION AND CURETTAGE OF UTERUS    . EXCISION OF BREAST LESION Right 03/15/2017   Procedure: RIGHT NIPPLE AND AREOLA EXCISION;  Surgeon: Rolm Bookbinder, MD;  Location: [place];  Service: General;  Laterality: Right;  . NIPPLE SPARING MASTECTOMY Bilateral 02/28/2017   Procedure: RIGHT NIPPLE SPARING MASTECTOMY; LEFT PROPHYLACTIC NIPPLE SPARING MASTECTOMY;  Surgeon: Rolm Bookbinder, MD;  Location: [place];  Service: General;  Laterality: Bilateral;  . PECTUS EXCAVATUM REPAIR  1996  . SENTINEL NODE BIOPSY Right 02/28/2017   Procedure: RIGHT AXILLARY SENTINEL LYMPH  NODE BIOPSY;  Surgeon: Rolm Bookbinder, MD;  Location: [place];  Service: General;  Laterality: Right;  . VULVAR LESION REMOVAL    . WISDOM TOOTH EXTRACTION       FAMILY HISTORY:  Family History  Problem Relation Age of Onset  . Skin cancer Mother   . Parkinson's disease Sister 44  . Diabetes Maternal Aunt   . Heart failure Maternal Grandmother   . Heart attack Maternal Grandfather   . Stomach cancer Paternal Grandmother   . Prostate cancer Paternal Grandfather   . Alzheimer's disease Paternal Grandfather   . Breast cancer Cousin        mother's paternal first FEMALE cousin     SOCIAL HISTORY:  reports that she has never smoked. She has never used smokeless tobacco. She reports that she does not drink alcohol or use drugs. The patient is married and lives in [place]. Her husband is a Arts administrator. 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   ALLERGIES: Peach flavor; Fentanyl; Penicillins; Sulfa antibiotics; and Latex   MEDICATIONS:  Current Outpatient Prescriptions  Medication Sig Dispense Refill  . acetaminophen (TYLENOL) 325 MG tablet Take 650 mg by mouth every 6 (six) hours as needed.    [name] dexamethasone (DECADRON) 4 MG tablet Take 2 tablets (8 mg total) by mouth 2 (two) times daily. Start the day  before Taxotere. Then again the day after chemo for 3 days. 30 tablet 1  . doxycycline (DORYX) 100 MG EC tablet Take 100 mg by mouth daily.    . fluticasone (FLONASE) 50 MCG/ACT nasal spray Place 1 spray into both nostrils daily as needed.    [name] LORazepam (ATIVAN) 0.5 MG tablet TAKE 1/2 TO 1 TABLET BY MOUTH AT BEDTIME AS NEEDED FOR ANXIETY 30 tablet 3  . LORazepam (ATIVAN) 0.5 MG tablet Take 1 tablet (0.5 mg total) by mouth at bedtime as needed (Nausea or vomiting). 30 tablet 0  . venlafaxine XR (EFFEXOR-XR) 75 MG 24 hr capsule Take 1 capsule (75 mg total) by mouth daily with breakfast. 90 capsule 3  . ibuprofen (ADVIL,MOTRIN) 200 MG tablet Take 200 mg by mouth every 6 (six) hours as needed.    . ondansetron (ZOFRAN) 8 MG tablet Take 1 tablet (8 mg total) by mouth 2 (two) times daily as needed for refractory nausea / vomiting. Start on day 3 after chemo. (Patient not taking: Reported on 08/09/2017) 30 tablet 1  . prochlorperazine (COMPAZINE) 10 MG tablet Take 1 tablet (10 mg total) by mouth every 6 (six) hours as  needed (Nausea or vomiting). (Patient not taking: Reported on 08/09/2017) 30 tablet 1  . rosuvastatin (CRESTOR) 5 MG tablet Take 1 tablet (5 mg total) by mouth daily. (Patient not taking: Reported on 08/09/2017) 90 tablet 3  . tamoxifen (NOLVADEX) 10 MG tablet Take 10 mg by mouth 2 (two) times daily.     No current facility-administered medications for this encounter.      REVIEW OF SYSTEMS: On review of systems, the patient reports that she is doing well overall. She feels like she tolerated chemotherapy well. She did not have any significant hair loss. She reports that she feels normal and no other complaints are noted.  PHYSICAL EXAM:  Wt Readings from Last 3 Encounters:  08/09/17 125 lb 9.6 oz (57 kg)  03/25/17 118 lb 11.2 oz (53.8 kg)  03/15/17 118 lb 8 oz (53.8 kg)   Temp Readings from Last 3 Encounters:  08/09/17 98.3 F (36.8 C) (Oral)  03/25/17 97.6 F (36.4 C) (Oral)    03/15/17 99.1 F (37.3 C) (Oral)   BP Readings from Last 3 Encounters:  08/09/17 (!) 101/50  03/25/17 105/65  03/15/17 103/61   Pulse Readings from Last 3 Encounters:  08/09/17 84  03/25/17 71  03/15/17 68   Pain Assessment Pain Score: 1  (Right outer breast under arm)/10  In general this is a well appearing Caucasian female in no acute distress. She is alert and oriented x4 and appropriate throughout the examination. HEENT reveals that the patient is normocephalic, atraumatic. EOMs are intact. PERRLA. Skin is intact without any evidence of gross lesions. Cardiopulmonary assessment is negative for acute distress and she exhibits normal effort.    ECOG = 1  0 - Asymptomatic (Fully active, able to carry on all predisease activities without restriction)  1 - Symptomatic but completely ambulatory (Restricted in physically strenuous activity but ambulatory and able to carry out work of a light or sedentary nature. For example, light housework, office work)  2 - Symptomatic, <50% in bed during the day (Ambulatory and capable of all self care but unable to carry out any work activities. Up and about more than 50% of waking hours)  3 - Symptomatic, >50% in bed, but not bedbound (Capable of only limited self-care, confined to bed or chair 50% or more of waking hours)  4 - Bedbound (Completely disabled. Cannot carry on any self-care. Totally confined to bed or chair)  5 - Death   Eustace Pen MM, Creech RH, Tormey DC, et al. [phone]). "Toxicity and response criteria of the [facility]". [place] Oncol. 5 (6): 649-55    LABORATORY DATA:  Lab Results  Component Value Date   WBC 5.9 07/12/2014   HGB 13.0 03/15/2017   HCT 40.8 07/12/2014   MCV 93.2 07/12/2014   PLT 202 12/21/2013   Lab Results  Component Value Date   NA 136 07/12/2014   K 4.2 07/12/2014   CL 99 07/12/2014   CO2 20 07/12/2014   Lab Results  Component Value Date   ALT 17 07/12/2014   AST 21  07/12/2014   ALKPHOS 40 07/12/2014   BILITOT 0.8 07/12/2014      RADIOGRAPHY: No results found.     IMPRESSION/PLAN: 1. Stage IIA, pT2N1Mx, grade 2, ER/PR positive invasive ductal carcinoma of the right breast. Dr. Lisbeth Renshaw discusses the patient's recent course and discusses that we would proceed with radiotherapy at this time. We discussed the risks, benefits, short, and long term effects of radiotherapy, and  the patient is interested in proceeding. Dr. Lisbeth Renshaw discusses the delivery and logistics of radiotherapy and anticipates a course of 6 1/2 weeks to the right breast and regional nodes. We will proceed with simulation today and anticipate starting treatment next week. Written consent is obtained and placed in the chart, a copy was provided to the patient. 2. Ovarian Suppression. The patient will proceed with [facility] and will follow up with Dr. Jana Hakim for this as well as for long term follow up.  In a visit lasting 25 minutes, greater than 50% of the time was spent face to face discussing options of radiotherapy, the fields, and coordinating the patient's care.   The above documentation reflects my direct findings during this shared patient visit. Please see the separate note by Dr. Lisbeth Renshaw on this date for the remainder of the patient's plan of care.    Carola Rhine, PAC

## 2017-08-09 NOTE — Addendum Note (Signed)
Encounter addended by: Kyung Rudd, MD on: 08/09/2017 11:08 AM<BR>    Actions taken: Edit attestation on clinical note

## 2017-08-11 NOTE — Progress Notes (Signed)
  Radiation Oncology         [phone] ________________________________  Name: ASHARIA LOTTER MRN: [id number]  Date: 08/09/2017  DOB: [birth year]  Optical Surface Tracking Plan:  Since intensity modulated radiotherapy (IMRT) and 3D conformal radiation treatment methods are predicated on accurate and precise positioning for treatment, intrafraction motion monitoring is medically necessary to ensure accurate and safe treatment delivery.  The ability to quantify intrafraction motion without excessive ionizing radiation dose can only be performed with optical surface tracking. Accordingly, surface imaging offers the opportunity to obtain 3D measurements of patient position throughout IMRT and 3D treatments without excessive radiation exposure.  I am ordering optical surface tracking for this patient's upcoming course of radiotherapy. ________________________________  Kyung Rudd, MD 08/11/2017 2:24 PM    Reference:   Particia Jasper, et al. Surface imaging-based analysis of intrafraction motion for breast radiotherapy patients.Journal of [place], n. 6, nov. 2014. ISSN 38871959.   Available at: <http://www.jacmp.org/index.php/jacmp/article/view/4957>.

## 2017-08-11 NOTE — Progress Notes (Signed)
  Radiation Oncology         (336) [phone] ________________________________  Name: Katelyn Shelton MRN: [id number]  Date: 08/09/2017  DOB: 1976-01-10  DIAGNOSIS:     ICD-10-CM   1. Malignant neoplasm of upper-outer quadrant of right breast in female, estrogen receptor positive ([place]) C50.411    Z17.0      SIMULATION AND TREATMENT PLANNING NOTE  The patient presented for simulation prior to beginning her course of radiation treatment for her diagnosis of right-sided breast cancer. The patient was placed in a supine position on a breast board. A customized vac-lock bag was also constructed and this complex treatment device will be used on a daily basis during her treatment. In this fashion, a CT scan was obtained through the chest area and an isocenter was placed near the chest wall at the upper aspect of the right chest.  The patient will be planned to receive a course of radiation initially to a dose of 50.4 gray. This will consist of a 4 field technique targeting the right chest wall as well as the supraclavicular region. Therefore 2 customized medial and lateral tangent fields have been created targeting the chest wall, and also 2 additional customized fields have been designed to treat the supraclavicular region both with a right supraclavicular field and a right posterior axillary boost field. A forward planning/reduced field technique will also be evaluated to determine if this significantly improves the dose homogeneity of the overall plan. Therefore, additional customized blocks/fields may be necessary.  This initial treatment will be accomplished at 1.8 gray per fraction.   The initial plan will consist of a 3-D conformal technique. The target volume/scar, heart and lungs have been contoured and dose volume histograms of each of these structures will be evaluated as part of the 3-D conformal treatment planning process.   It is anticipated that the patient will then receive a 10 gray boost to  the surgical scar. This will be accomplished at 2 gray per fraction. The final anticipated total dose therefore will correspond to 60.4 gray.    _______________________________   Jodelle Gross, MD, PhD

## 2017-08-12 ENCOUNTER — Telehealth: Payer: Self-pay | Admitting: Oncology

## 2017-08-12 NOTE — Telephone Encounter (Signed)
Left message regarding new appt added to patient schedule for 10/26 -

## 2017-08-12 NOTE — Telephone Encounter (Signed)
Scheduled appt per sch m essage - left message with appt date and time and sent reminder letter in the mail.

## 2017-08-15 ENCOUNTER — Other Ambulatory Visit: Payer: Self-pay | Admitting: *Deleted

## 2017-08-15 DIAGNOSIS — F329 Major depressive disorder, single episode, unspecified: Secondary | ICD-10-CM | POA: Diagnosis not present

## 2017-08-15 DIAGNOSIS — Z880 Allergy status to penicillin: Secondary | ICD-10-CM | POA: Diagnosis not present

## 2017-08-15 DIAGNOSIS — F419 Anxiety disorder, unspecified: Secondary | ICD-10-CM | POA: Diagnosis not present

## 2017-08-15 DIAGNOSIS — C50419 Malignant neoplasm of upper-outer quadrant of unspecified female breast: Secondary | ICD-10-CM | POA: Diagnosis not present

## 2017-08-15 DIAGNOSIS — Z9013 Acquired absence of bilateral breasts and nipples: Secondary | ICD-10-CM | POA: Diagnosis not present

## 2017-08-15 DIAGNOSIS — Z170 Estrogen receptor positive status [ER+]: Secondary | ICD-10-CM | POA: Diagnosis not present

## 2017-08-15 DIAGNOSIS — C50911 Malignant neoplasm of unspecified site of right female breast: Secondary | ICD-10-CM

## 2017-08-15 DIAGNOSIS — C50411 Malignant neoplasm of upper-outer quadrant of right female breast: Secondary | ICD-10-CM | POA: Diagnosis not present

## 2017-08-15 DIAGNOSIS — Z8614 Personal history of Methicillin resistant Staphylococcus aureus infection: Secondary | ICD-10-CM | POA: Diagnosis not present

## 2017-08-16 ENCOUNTER — Ambulatory Visit
Admission: RE | Admit: 2017-08-16 | Discharge: 2017-08-16 | Disposition: A | Discharge status: Home / Self Care | Payer: 59 | Source: Ambulatory Visit | Attending: Radiation Oncology | Admitting: Radiation Oncology

## 2017-08-16 DIAGNOSIS — Z170 Estrogen receptor positive status [ER+]: Secondary | ICD-10-CM | POA: Diagnosis not present

## 2017-08-16 DIAGNOSIS — Z9013 Acquired absence of bilateral breasts and nipples: Secondary | ICD-10-CM | POA: Diagnosis not present

## 2017-08-16 DIAGNOSIS — Z8614 Personal history of Methicillin resistant Staphylococcus aureus infection: Secondary | ICD-10-CM | POA: Diagnosis not present

## 2017-08-16 DIAGNOSIS — Z880 Allergy status to penicillin: Secondary | ICD-10-CM | POA: Diagnosis not present

## 2017-08-16 DIAGNOSIS — C50411 Malignant neoplasm of upper-outer quadrant of right female breast: Secondary | ICD-10-CM | POA: Diagnosis not present

## 2017-08-16 DIAGNOSIS — F329 Major depressive disorder, single episode, unspecified: Secondary | ICD-10-CM | POA: Diagnosis not present

## 2017-08-16 DIAGNOSIS — F419 Anxiety disorder, unspecified: Secondary | ICD-10-CM | POA: Diagnosis not present

## 2017-08-16 MED FILL — DOXYCYCLINE HYCLATE 100 MG: 100 | 30 days supply | Qty: 30 | Fill #0

## 2017-08-16 MED FILL — VENLAFAXINE HCL ER 75 MG CA: 75 | 90 days supply | Qty: 90 | Fill #1

## 2017-08-17 ENCOUNTER — Ambulatory Visit
Admission: RE | Admit: 2017-08-17 | Discharge: 2017-08-17 | Disposition: A | Discharge status: Home / Self Care | Payer: 59 | Source: Ambulatory Visit | Attending: Radiation Oncology | Admitting: Radiation Oncology

## 2017-08-17 DIAGNOSIS — Z9013 Acquired absence of bilateral breasts and nipples: Secondary | ICD-10-CM | POA: Diagnosis not present

## 2017-08-17 DIAGNOSIS — F419 Anxiety disorder, unspecified: Secondary | ICD-10-CM | POA: Diagnosis not present

## 2017-08-17 DIAGNOSIS — Z880 Allergy status to penicillin: Secondary | ICD-10-CM | POA: Diagnosis not present

## 2017-08-17 DIAGNOSIS — Z8614 Personal history of Methicillin resistant Staphylococcus aureus infection: Secondary | ICD-10-CM | POA: Diagnosis not present

## 2017-08-17 DIAGNOSIS — F329 Major depressive disorder, single episode, unspecified: Secondary | ICD-10-CM | POA: Diagnosis not present

## 2017-08-17 DIAGNOSIS — Z170 Estrogen receptor positive status [ER+]: Secondary | ICD-10-CM | POA: Diagnosis not present

## 2017-08-17 DIAGNOSIS — C50411 Malignant neoplasm of upper-outer quadrant of right female breast: Secondary | ICD-10-CM | POA: Diagnosis not present

## 2017-08-18 ENCOUNTER — Ambulatory Visit
Admission: RE | Admit: 2017-08-18 | Discharge: 2017-08-18 | Disposition: A | Discharge status: Home / Self Care | Payer: 59 | Source: Ambulatory Visit | Attending: Radiation Oncology | Admitting: Radiation Oncology

## 2017-08-18 DIAGNOSIS — Z880 Allergy status to penicillin: Secondary | ICD-10-CM | POA: Diagnosis not present

## 2017-08-18 DIAGNOSIS — Z9013 Acquired absence of bilateral breasts and nipples: Secondary | ICD-10-CM | POA: Diagnosis not present

## 2017-08-18 DIAGNOSIS — F419 Anxiety disorder, unspecified: Secondary | ICD-10-CM | POA: Diagnosis not present

## 2017-08-18 DIAGNOSIS — F329 Major depressive disorder, single episode, unspecified: Secondary | ICD-10-CM | POA: Diagnosis not present

## 2017-08-18 DIAGNOSIS — Z170 Estrogen receptor positive status [ER+]: Secondary | ICD-10-CM | POA: Diagnosis not present

## 2017-08-18 DIAGNOSIS — Z8614 Personal history of Methicillin resistant Staphylococcus aureus infection: Secondary | ICD-10-CM | POA: Diagnosis not present

## 2017-08-18 DIAGNOSIS — C50411 Malignant neoplasm of upper-outer quadrant of right female breast: Secondary | ICD-10-CM | POA: Diagnosis not present

## 2017-08-18 NOTE — Progress Notes (Signed)
[place]  [phone] [phone]     ID: Katelyn Shelton DOB: 1975/11/19  MR#: [id number]  JYN#:[id number]  Patient Care Team: Chipper Herb, MD as PCP - General (Family Medicine) Garlen Reinig, Virgie Dad, MD as Consulting Physician (Oncology) Rolm Bookbinder, MD as Consulting Physician (General Surgery) Louretta Shorten, MD as Consulting Physician (Obstetrics and Gynecology) Juanita Craver Gerrie Nordmann, MD as Referring Physician (Hematology and Oncology) Kyung Rudd, MD as Consulting Physician (Radiation Oncology) Irene Limbo, MD as Consulting Physician (Plastic Surgery) Sorscher, Danice Goltz, MD as Referring Physician (Hematology and Oncology) Muss, Demaris Callander as Consulting Physician (Internal Medicine) OTHER MD:  CHIEF COMPLAINT: Estrogen receptor positive breast cancer  CURRENT TREATMENT: Adjuvant radiation, goserelin, [anastrozole]   INTERVAL HISTORY: Katelyn Shelton returns today for follow-up and treatment of her estrogen receptor positive breast cancer. Since her last visit here she received chemotherapy under Dr. Shary Decamp at [facility], namely cyclophosphamide and Taxotere, sixth and final final dose August 03, 2017.  She feels she did "terrific" with the chemo, and in particular never developed any peripheral neuropathy.  She had had a cold, which was painful (at 10 of 10 she says) and she cried for the first 15 or 20 minutes while using it, but it let her keep at least have her hair and she would "do it again if needed to".  She has since met with Dr. Lisbeth Renshaw and is in her first week of radiation oncology  She has not had a period since August 2018.  REVIEW OF SYSTEMS: Katelyn Shelton reports that she walks 3-4 miles daily. She went hiking the [place] trial the 3rd week prior to the start of a new cycle of chemotherapy and had reported myalgias. She has had myalgias the 40th week of the last 2 cycles. During her last cycle, the myalgia lasted approximately 1 week. She had  fatigue only on the 3rd and 4th day following a cycle of chemotherapy. She denies neuropathy at this time. She denies constipation. She has weight gain of 10 lbs. Pt LMP was the first week of August. She would like to be referred to a Nutritionist to aid with weight gain. Her children are doing well at this time. She denies unusual headaches, visual changes, nausea, vomiting, or dizziness. There has been no unusual cough, phlegm production, or pleurisy. This been no change in bowel or bladder habits. She denies unexplained fatigue or unexplained weight loss, bleeding, rash, or fever. A detailed review of systems was otherwise stable.    BREAST CANCER HISTORY: From the original intake note:  "Katelyn Shelton" saw Dr. Corinna Capra for routine follow-up and was found to have a palpable mass in the upper outer quadrant of her right breast and was set up for bilateral diagnostic mammography with tomography and right breast ultrasonography at the [facility] 02/08/2017. The breast density was category C. In the upper outer quadrant of the right breast there was an irregular mass. There was also a 0.3 cm group of slightly heterogeneous calcifications in the retroareolar left breast. On exam there was indeed a firm palpable mass at the 10:00 position of the right breast 4 cm from the nipple. On ultrasound this measured 3.5 cm, it was irregular and hypoechoic. There was no abnormal right axillary adenopathy.  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 The area of left  breast calcifications was biopsied 02/09/2017 and showed only fibrocystic changes. ([id number] [id number]).  Her subsequent history is as detailed below.  PAST MEDICAL HISTORY: Past Medical History:  Diagnosis Date  . Anxiety   . Breast cancer ([place])  01/2017   right; genetic testing negative in 2018 with [facility] panel  . Depression   . History of MRSA infection    elbow    PAST SURGICAL HISTORY: Past Surgical History:  Procedure Laterality Date  . BREAST RECONSTRUCTION WITH PLACEMENT OF TISSUE EXPANDER AND FLEX HD (ACELLULAR HYDRATED DERMIS) Bilateral 02/28/2017   Procedure: BILATERAL BREAST RECONSTRUCTION WITH PLACEMENT OF TISSUE EXPANDER AND ALLODERM;  Surgeon: Irene Limbo, MD;  Location: [facility];  Service: Plastics;  Laterality: Bilateral;  . CESAREAN SECTION     x 2 (3 total)  . CESAREAN SECTION  02/04/2012   Procedure: CESAREAN SECTION;  Surgeon: Cyril Mourning, MD;  Location: [facility];  Service: Gynecology;  Laterality: N/A;  Repeat Cesarean Section Delivery  Boy  @  [phone], Apgars 9/10  . DILATION AND CURETTAGE OF UTERUS    . EXCISION OF BREAST LESION Right 03/15/2017   Procedure: RIGHT NIPPLE AND AREOLA EXCISION;  Surgeon: Rolm Bookbinder, MD;  Location: [facility];  Service: General;  Laterality: Right;  . NIPPLE SPARING MASTECTOMY Bilateral 02/28/2017   Procedure: RIGHT NIPPLE SPARING MASTECTOMY; LEFT PROPHYLACTIC NIPPLE SPARING MASTECTOMY;  Surgeon: Rolm Bookbinder, MD;  Location: [facility];  Service: General;  Laterality: Bilateral;  . PECTUS EXCAVATUM REPAIR  1996  . SENTINEL NODE BIOPSY Right 02/28/2017   Procedure: RIGHT AXILLARY SENTINEL LYMPH  NODE BIOPSY;  Surgeon: Rolm Bookbinder, MD;  Location: [facility];  Service: General;  Laterality: Right;  . VULVAR LESION REMOVAL    . WISDOM TOOTH EXTRACTION      FAMILY HISTORY Family History  Problem Relation Age of Onset  . Skin cancer Mother   . Parkinson's disease Sister 61  . Diabetes Maternal Aunt   . Heart failure Maternal Grandmother   . Heart attack Maternal Grandfather   . Stomach cancer Paternal Grandmother   . Prostate cancer Paternal Grandfather   . Alzheimer's disease Paternal  Grandfather   . Breast cancer Cousin        mother's paternal first FEMALE cousin   The patient's father, Carolyne Fiscal, is an Administrator, Civil Service, currently 40 years old. The patient's mother is 30 years old as of April 2018. Patient has no brothers, 2 sisters. There is a history breast cancer in the family in a great aunt on the patient's father's side. On the maternal side there is a female first cousin of the patient's mother with breast cancer. There is no history of ovarian cancer in the family  GYNECOLOGIC HISTORY:  No LMP recorded. Katelyn Shelton had her first menstrual period age 11, her first live birth age 57. She has had 3 C-sections. Her periods are approximately every 21 days, with one heavy day. Her husband is status post vasectomy.  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 Her husband [name] is one of our cardiologists. At that patient attends [employer] [place]:    HEALTH MAINTENANCE: Social History  Substance Use Topics  . Smoking status: Never Smoker  . Smokeless tobacco: Never Used  . Alcohol use No     Colonoscopy:  PAP:  Bone density:   Allergies  Allergen Reactions  .  Peach Flavor Anaphylaxis  . Fentanyl Itching  . Penicillins Hives    AS A CHILD  . Sulfa Antibiotics Nausea And Vomiting  . Latex Itching    Current Outpatient Prescriptions  Medication Sig Dispense Refill  . acetaminophen (TYLENOL) 325 MG tablet Take 650 mg by mouth every 6 (six) hours as needed.    [name] dexamethasone (DECADRON) 4 MG tablet Take 2 tablets (8 mg total) by mouth 2 (two) times daily. Start the day before Taxotere. Then again the day after chemo for 3 days. 30 tablet 1  . doxycycline (DORYX) 100 MG EC tablet Take 100 mg by mouth daily.    . fluticasone (FLONASE) 50 MCG/ACT nasal spray Place 1 spray into both nostrils daily as needed.    Derrill Memo ON 08/22/2017] hyaluronate sodium (RADIAPLEXRX) GEL Apply 1  application topically 2 (two) times daily.    [name] ibuprofen (ADVIL,MOTRIN) 200 MG tablet Take 200 mg by mouth every 6 (six) hours as needed.    [name] LORazepam (ATIVAN) 0.5 MG tablet TAKE 1/2 TO 1 TABLET BY MOUTH AT BEDTIME AS NEEDED FOR ANXIETY 30 tablet 3  . LORazepam (ATIVAN) 0.5 MG tablet Take 1 tablet (0.5 mg total) by mouth at bedtime as needed (Nausea or vomiting). 30 tablet 0  . non-metallic deodorant (ALRA) MISC Apply 1 application topically daily as needed.    . ondansetron (ZOFRAN) 8 MG tablet Take 1 tablet (8 mg total) by mouth 2 (two) times daily as needed for refractory nausea / vomiting. Start on day 3 after chemo. (Patient not taking: Reported on 08/09/2017) 30 tablet 1  . prochlorperazine (COMPAZINE) 10 MG tablet Take 1 tablet (10 mg total) by mouth every 6 (six) hours as needed (Nausea or vomiting). (Patient not taking: Reported on 08/09/2017) 30 tablet 1  . rosuvastatin (CRESTOR) 5 MG tablet Take 1 tablet (5 mg total) by mouth daily. (Patient not taking: Reported on 08/09/2017) 90 tablet 3  . tamoxifen (NOLVADEX) 10 MG tablet Take 10 mg by mouth 2 (two) times daily.    [name] venlafaxine XR (EFFEXOR-XR) 75 MG 24 hr capsule Take 1 capsule (75 mg total) by mouth daily with breakfast. 90 capsule 3   No current facility-administered medications for this visit.     OBJECTIVE: Young white woman in no acute distress  Vitals:   08/19/17 1356  BP: (!) 110/58  Pulse: 82  Resp: 18  Temp: 98.7 F (37.1 C)  SpO2: 100%     Body mass index is 25.15 kg/m.    ECOG FS:1 - Symptomatic but completely ambulatory   Sclerae unicteric, pupils round and equal Oropharynx clear and moist No cervical or supraclavicular adenopathy Lungs no rales or rhonchi Heart regular rate and rhythm Abd soft, nontender, positive bowel sounds MSK no focal spinal tenderness, no upper extremity lymphedema Neuro: nonfocal, well oriented, appropriate affect Breasts: Status post bilateral mastectomies with expanders in  place.  The nipple on the right is missing.  There is no evidence of local recurrence or residual disease.  Both axillae are benign   LAB RESULTS:  CMP     Component Value Date/Time   NA 140 08/19/2017 1337   K 3.6 08/19/2017 1337   CL 99 07/12/2014 0851   CO2 24 08/19/2017 1337   GLUCOSE 88 08/19/2017 1337   BUN 10.9 08/19/2017 1337   CREATININE 0.7 08/19/2017 1337   CALCIUM 9.0 08/19/2017 1337   PROT 6.6 08/19/2017 1337   ALBUMIN 3.9 08/19/2017 1337   AST 35 (H)  08/19/2017 1337   ALT 55 08/19/2017 1337   ALKPHOS 61 08/19/2017 1337   BILITOT 0.41 08/19/2017 1337   GFRNONAA 112 07/12/2014 0851   GFRAA 129 07/12/2014 0851    No results found for: TOTALPROTELP, ALBUMINELP, A1GS, A2GS, BETS, BETA2SER, GAMS, MSPIKE, SPEI  No results found for: Nils Pyle, [facility]  Lab Results  Component Value Date   WBC 6.8 08/19/2017   NEUTROABS 5.1 08/19/2017   HGB 12.2 08/19/2017   HCT 36.4 08/19/2017   MCV 97.7 08/19/2017   PLT 198 08/19/2017      Chemistry      Component Value Date/Time   NA 140 08/19/2017 1337   K 3.6 08/19/2017 1337   CL 99 07/12/2014 0851   CO2 24 08/19/2017 1337   BUN 10.9 08/19/2017 1337   CREATININE 0.7 08/19/2017 1337      Component Value Date/Time   CALCIUM 9.0 08/19/2017 1337   ALKPHOS 61 08/19/2017 1337   AST 35 (H) 08/19/2017 1337   ALT 55 08/19/2017 1337   BILITOT 0.41 08/19/2017 1337       No results found for: LABCA2  No components found for: [id number]  No results for input(s): INR in the last 168 hours.  Urinalysis    Component Value Date/Time   COLORURINE YELLOW 08/26/2011 1652   APPEARANCEUR CLEAR 08/26/2011 1652   LABSPEC 1.025 12/21/2013 1226   PHURINE 6.0 12/21/2013 1226   GLUCOSEU NEGATIVE 12/21/2013 1226   HGBUR TRACE (A) 12/21/2013 1226   BILIRUBINUR neg 07/12/2014 0913   KETONESUR NEGATIVE 12/21/2013 1226   PROTEINUR 4+ 07/12/2014 0913   PROTEINUR NEGATIVE 12/21/2013 1226   UROBILINOGEN negative  07/12/2014 0913   UROBILINOGEN 0.2 12/21/2013 1226   NITRITE neg 07/12/2014 0913   NITRITE NEGATIVE 12/21/2013 1226   LEUKOCYTESUR small (1+) 07/12/2014 0913     STUDIES: No results found.   ELIGIBLE FOR AVAILABLE RESEARCH PROTOCOL: no  ASSESSMENT: 41 y.o. [place] woman status post right breast upper outer quadrant biopsy 02/08/2017 for a clinical T2 N0, stage IB invasive ductal carcinoma, grade 2, estrogen and progesterone receptor positive, HER-2 nonamplified, with an MIB-1 of 5%.  (a) biopsy of upper outer quadrant calcifications in the left breast 02/09/2017 were benign  (1) Oncotype DX obtained from the biopsy sample showed a recurrence score of 11 predicting a 10 year risk of recurrence outside the breast of 9% if the patient's only systemic therapy is tamoxifen for 5 years (node positive report)  (2) bilateral mastectomies with right sentinel lymph node sampling 02/28/2017 showed  (a) on the left, intraductal papilloma, with no evidence of malignancy.  (b) on the right, a pT2 pN1 stage IIA invasive ductal carcinoma, grade 1, with close margins  (c) additional excision of the right nipple areolar area 03/15/2017 found no residual carcinoma  (3) Mammaprint study on the final surgical sample came back low risk, predicting a five-year distant disease-free survival of 97.8% with hormone therapy alone   (3) tamoxifen started neoadjuvantly 02/11/2017, Stopped 04/11/2017 in preparation for chemotherapy  (4) genetics counseling 02/19/2017 showed no deleterious mutations in the STAT gene panel offered by [facility] including ATM, BRCA1, BRCA2, CDH1, CHEK2, PALB2, PTEN, STK11, and TP53.  (a) multi gene panel February 21, 2017 through the  Multi-Gene Panel offered by [facility] found no deleterious mutations in ALK, APC, ATM, AXIN2,BAP1,  BARD1, BLM, BMPR1A, BRCA1, BRCA2, BRIP1, CASR, CDC73, CDH1, CDK4, CDKN1B, CDKN1C, CDKN2A (p14ARF), CDKN2A (p16INK4a), CEBPA, CHEK2, CTNNA1, DICER1,  DIS3L2, EGFR (c.2369C>T, p.Thr790Met variant only), EPCAM (  Deletion/duplication testing only), FH, FLCN, GATA2, GPC3, GREM1 (Promoter region deletion/duplication testing only), HOXB13 (c.251G>A, p.Gly84Glu), HRAS, KIT, MAX, MEN1, MET, MITF (c.952G>A, p.Glu318Lys variant only), MLH1, MSH2, MSH3, MSH6, MUTYH, NBN, NF1, NF2, NTHL1, PALB2, PDGFRA, PHOX2B, PMS2, POLD1, POLE, POT1, PRKAR1A, PTCH1, PTEN, RAD50, RAD51C, RAD51D, RB1, RECQL4, RET, RUNX1, SDHAF2, SDHA (sequence changes only), SDHB, SDHC, SDHD, SMAD4, SMARCA4, SMARCB1, SMARCE1, STK11, SUFU, TERT, TERT, TMEM127, TP53, TSC1, TSC2, VHL, WRN and WT1.   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   PLAN: Katelyn Shelton did remarkably well with her 6 cycles of chemotherapy and in particular never developed any peripheral neuropathy.  She mostly kept her hair.  She stopped having periods in August and she has had a few hot flashes.  Of more concern to her is the weight gain of approximately 10 pounds since June.  We discussed menopausal issues in detail and she understands that weight gain between 15 and 20 pounds is average at menopause and that to avoid that she would have to change her diet--she already has an excellent exercise program in place.  Other issues of course include hot flashes, insomnia, mood changes, thin her bones and vaginal dryness.  As far as the mood changes and hot flashes are concerned she is on venlafaxine at 75 mg daily.  She tolerates that well and does not wish to increase the dose.  As far as the vaginal dryness problem is concerned she is aware that we have a "pelvic health" program here to help with that complex issue.  We  again discussed the difference between tamoxifen and the aromatase inhibitors and she understands that tamoxifen" does not take away her estrogen", and therefore works in premenopausal women as well as postmenopausal and from a menopausal point review is generally much better tolerated.  She remains very motivated to do everything possible to keep this cancer from coming back and so she is starting goserelin today.  She is aware of the possible toxicity side effects and complications of this agent including the fact that it actually stimulates ovulation for the first month (recall her husband is status post vasectomy).  After 2 doses she will be ready to start the anastrozole, and she will be finishing radiation right around that time  Accordingly she will see me again October 05, 2017.  She will be done with local treatment then and we can review how she is doing with menopausal symptoms.  She may be ready to start anastrozole at that point  She tells me she is being scheduled for a bone density, which is appropriate.  She is also interested in a nutrition consult which I am placing for her  She will let me know if any other problems develop before the next visit here.    Normagene Harvie, Virgie Dad, MD  08/19/17 5:13 PM Medical Oncology and Hematology [facility] [address] [place], [place] [address] Tel. [phone]    Fax. [phone]  This document serves as a record of services personally performed by Lurline Del, MD. It was created on her behalf by Steva Colder, a trained medical scribe. The creation of this record is based on the scribe's personal observations and the provider's statements to them. This  document has been checked and approved by the attending provider.

## 2017-08-19 ENCOUNTER — Ambulatory Visit (HOSPITAL_BASED_OUTPATIENT_CLINIC_OR_DEPARTMENT_OTHER): Payer: 59

## 2017-08-19 ENCOUNTER — Other Ambulatory Visit (HOSPITAL_BASED_OUTPATIENT_CLINIC_OR_DEPARTMENT_OTHER): Payer: 59

## 2017-08-19 ENCOUNTER — Ambulatory Visit
Admission: RE | Admit: 2017-08-19 | Discharge: 2017-08-19 | Disposition: A | Discharge status: Home / Self Care | Payer: 59 | Source: Ambulatory Visit | Attending: Radiation Oncology | Admitting: Radiation Oncology

## 2017-08-19 ENCOUNTER — Ambulatory Visit (HOSPITAL_BASED_OUTPATIENT_CLINIC_OR_DEPARTMENT_OTHER): Payer: 59 | Admitting: Oncology

## 2017-08-19 VITALS — BP 110/58 | HR 82 | Temp 98.7°F | Resp 18 | Ht 60.0 in | Wt 128.8 lb

## 2017-08-19 DIAGNOSIS — Z8614 Personal history of Methicillin resistant Staphylococcus aureus infection: Secondary | ICD-10-CM | POA: Diagnosis not present

## 2017-08-19 DIAGNOSIS — Z9013 Acquired absence of bilateral breasts and nipples: Secondary | ICD-10-CM | POA: Diagnosis not present

## 2017-08-19 DIAGNOSIS — C50411 Malignant neoplasm of upper-outer quadrant of right female breast: Secondary | ICD-10-CM

## 2017-08-19 DIAGNOSIS — Z170 Estrogen receptor positive status [ER+]: Secondary | ICD-10-CM | POA: Diagnosis not present

## 2017-08-19 DIAGNOSIS — Z5111 Encounter for antineoplastic chemotherapy: Secondary | ICD-10-CM

## 2017-08-19 DIAGNOSIS — F329 Major depressive disorder, single episode, unspecified: Secondary | ICD-10-CM | POA: Diagnosis not present

## 2017-08-19 DIAGNOSIS — F419 Anxiety disorder, unspecified: Secondary | ICD-10-CM | POA: Diagnosis not present

## 2017-08-19 DIAGNOSIS — C50911 Malignant neoplasm of unspecified site of right female breast: Secondary | ICD-10-CM

## 2017-08-19 DIAGNOSIS — Z880 Allergy status to penicillin: Secondary | ICD-10-CM | POA: Diagnosis not present

## 2017-08-19 LAB — CBC WITH DIFFERENTIAL/PLATELET
BASO%: 1 % (ref 0.0–2.0)
Basophils Absolute: 0.1 10*3/uL (ref 0.0–0.1)
EOS%: 0.3 % (ref 0.0–7.0)
Eosinophils Absolute: 0 10*3/uL (ref 0.0–0.5)
HEMATOCRIT: 36.4 % (ref 34.8–46.6)
HEMOGLOBIN: 12.2 g/dL (ref 11.6–15.9)
LYMPH#: 1.1 10*3/uL (ref 0.9–3.3)
LYMPH%: 16.2 % (ref 14.0–49.7)
MCH: 32.8 pg (ref 25.1–34.0)
MCHC: 33.6 g/dL (ref 31.5–36.0)
MCV: 97.7 fL (ref 79.5–101.0)
MONO#: 0.5 10*3/uL (ref 0.1–0.9)
MONO%: 7.3 % (ref 0.0–14.0)
NEUT%: 75.2 % (ref 38.4–76.8)
NEUTROS ABS: 5.1 10*3/uL (ref 1.5–6.5)
PLATELETS: 198 10*3/uL (ref 145–400)
RBC: 3.72 10*6/uL (ref 3.70–5.45)
RDW: 16.1 % — AB (ref 11.2–14.5)
WBC: 6.8 10*3/uL (ref 3.9–10.3)

## 2017-08-19 LAB — COMPREHENSIVE METABOLIC PANEL
ALBUMIN: 3.9 g/dL (ref 3.5–5.0)
ALT: 55 U/L (ref 0–55)
ANION GAP: 10 meq/L (ref 3–11)
AST: 35 U/L — AB (ref 5–34)
Alkaline Phosphatase: 61 U/L (ref 40–150)
BILIRUBIN TOTAL: 0.41 mg/dL (ref 0.20–1.20)
BUN: 10.9 mg/dL (ref 7.0–26.0)
CALCIUM: 9 mg/dL (ref 8.4–10.4)
CO2: 24 mEq/L (ref 22–29)
CREATININE: 0.7 mg/dL (ref 0.6–1.1)
Chloride: 106 mEq/L (ref 98–109)
EGFR: >60 mL/min/{1.73_m2} (ref >60)
Glucose: 88 mg/dl (ref 70–140)
Potassium: 3.6 mEq/L (ref 3.5–5.1)
Sodium: 140 mEq/L (ref 136–145)
TOTAL PROTEIN: 6.6 g/dL (ref 6.4–8.3)

## 2017-08-19 MED ORDER — ALRA NON-METALLIC DEODORANT (RAD-ONC)
1.0000 "application " | Freq: Once | TOPICAL | Status: AC
  Administered 2017-08-19: 1 via TOPICAL

## 2017-08-19 MED ORDER — RADIAPLEXRX EX GEL
Freq: Once | CUTANEOUS | Status: AC
  Administered 2017-08-19: 14:00:00 via TOPICAL

## 2017-08-19 MED ORDER — GOSERELIN ACETATE 3.6 MG ~~LOC~~ IMPL
3.6000 mg | DRUG_IMPLANT | Freq: Once | SUBCUTANEOUS | Status: AC
  Administered 2017-08-19: 3.6 mg via SUBCUTANEOUS
  Filled 2017-08-19: qty 3.6

## 2017-08-19 NOTE — Progress Notes (Signed)
Pt here for patient teaching.  Pt given Radiation and You booklet, skin care instructions, Alra deodorant and Radiaplex gel.  Reviewed areas of pertinence such as fatigue, skin changes, blurry vision and breast swelling . Pt able to give teach back of to pat skin and use unscented/gentle soap,apply Radiaplex bid, apply Sonafine bid, avoid applying anything to skin within 4 hours of treatment, avoid wearing an under wire bra and to use an electric razor if they must shave. Pt verbalizes understanding of information given and will contact nursing with any questions or concerns.     [email or web address]

## 2017-08-19 NOTE — Patient Instructions (Signed)
Goserelin injection What is this medicine? GOSERELIN (GOE se rel in) is similar to a hormone found in the body. It lowers the amount of sex hormones that the body makes. Men will have lower testosterone levels and women will have lower estrogen levels while taking this medicine. In men, this medicine is used to treat prostate cancer; the injection is either given once per month or once every 12 weeks. A once per month injection (only) is used to treat women with endometriosis, dysfunctional uterine bleeding, or advanced breast cancer. This medicine may be used for other purposes; ask your health care provider or pharmacist if you have questions. COMMON BRAND NAME(S): Zoladex What should I tell my health care provider before I take this medicine? They need to know if you have any of these conditions (some only apply to women): -diabetes -heart disease or previous heart attack -high blood pressure -high cholesterol -kidney disease -osteoporosis or low bone density -problems passing urine -spinal cord injury -stroke -tobacco smoker -an unusual or allergic reaction to goserelin, hormone therapy, other medicines, foods, dyes, or preservatives -pregnant or trying to get pregnant -breast-feeding How should I use this medicine? This medicine is for injection under the skin. It is given by a health care professional in a hospital or clinic setting. Men receive this injection once every 4 weeks or once every 12 weeks. Women will only receive the once every 4 weeks injection. Talk to your pediatrician regarding the use of this medicine in children. Special care may be needed. Overdosage: If you think you have taken too much of this medicine contact a poison control center or emergency room at once. NOTE: This medicine is only for you. Do not share this medicine with others. What if I miss a dose? It is important not to miss your dose. Call your doctor or health care professional if you are unable to  keep an appointment. What may interact with this medicine? -female hormones like estrogen -herbal or dietary supplements like black cohosh, chasteberry, or DHEA -female hormones like testosterone -prasterone This list may not describe all possible interactions. Give your health care provider a list of all the medicines, herbs, non-prescription drugs, or dietary supplements you use. Also tell them if you smoke, drink alcohol, or use illegal drugs. Some items may interact with your medicine. What should I watch for while using this medicine? Visit your doctor or health care professional for regular checks on your progress. Your symptoms may appear to get worse during the first weeks of this therapy. Tell your doctor or healthcare professional if your symptoms do not start to get better or if they get worse after this time. Your bones may get weaker if you take this medicine for a long time. If you smoke or frequently drink alcohol you may increase your risk of bone loss. A family history of osteoporosis, chronic use of drugs for seizures (convulsions), or corticosteroids can also increase your risk of bone loss. Talk to your doctor about how to keep your bones strong. This medicine should stop regular monthly menstration in women. Tell your doctor if you continue to menstrate. Women should not become pregnant while taking this medicine or for 12 weeks after stopping this medicine. Women should inform their doctor if they wish to become pregnant or think they might be pregnant. There is a potential for serious side effects to an unborn child. Talk to your health care professional or pharmacist for more information. Do not breast-feed an infant while taking   this medicine. Men should inform their doctors if they wish to father a child. This medicine may lower sperm counts. Talk to your health care professional or pharmacist for more information. What side effects may I notice from receiving this  medicine? Side effects that you should report to your doctor or health care professional as soon as possible: -allergic reactions like skin rash, itching or hives, swelling of the face, lips, or tongue -bone pain -breathing problems -changes in vision -chest pain -feeling faint or lightheaded, falls -fever, chills -pain, swelling, warmth in the leg -pain, tingling, numbness in the hands or feet -signs and symptoms of low blood pressure like dizziness; feeling faint or lightheaded, falls; unusually weak or tired -stomach pain -swelling of the ankles, feet, hands -trouble passing urine or change in the amount of urine -unusually high or low blood pressure -unusually weak or tired Side effects that usually do not require medical attention (report to your doctor or health care professional if they continue or are bothersome): -change in sex drive or performance -changes in breast size in both males and females -changes in emotions or moods -headache -hot flashes -irritation at site where injected -loss of appetite -skin problems like acne, dry skin -vaginal dryness This list may not describe all possible side effects. Call your doctor for medical advice about side effects. You may report side effects to FDA at 1-800-FDA-1088. Where should I keep my medicine? This drug is given in a hospital or clinic and will not be stored at home. NOTE: This sheet is a summary. It may not cover all possible information. If you have questions about this medicine, talk to your doctor, pharmacist, or health care provider.  2018 Elsevier/Gold Standard (2013-12-18 11:10:35)  

## 2017-08-20 LAB — FOLLICLE STIMULATING HORMONE: FSH: 31.5 m[IU]/mL

## 2017-08-22 ENCOUNTER — Ambulatory Visit
Admission: RE | Admit: 2017-08-22 | Discharge: 2017-08-22 | Disposition: A | Discharge status: Home / Self Care | Payer: 59 | Source: Ambulatory Visit | Attending: Radiation Oncology | Admitting: Radiation Oncology

## 2017-08-22 DIAGNOSIS — Z170 Estrogen receptor positive status [ER+]: Secondary | ICD-10-CM | POA: Diagnosis not present

## 2017-08-22 DIAGNOSIS — Z880 Allergy status to penicillin: Secondary | ICD-10-CM | POA: Diagnosis not present

## 2017-08-22 DIAGNOSIS — Z9013 Acquired absence of bilateral breasts and nipples: Secondary | ICD-10-CM | POA: Diagnosis not present

## 2017-08-22 DIAGNOSIS — Z8614 Personal history of Methicillin resistant Staphylococcus aureus infection: Secondary | ICD-10-CM | POA: Diagnosis not present

## 2017-08-22 DIAGNOSIS — F419 Anxiety disorder, unspecified: Secondary | ICD-10-CM | POA: Diagnosis not present

## 2017-08-22 DIAGNOSIS — C50411 Malignant neoplasm of upper-outer quadrant of right female breast: Secondary | ICD-10-CM | POA: Diagnosis not present

## 2017-08-22 DIAGNOSIS — F329 Major depressive disorder, single episode, unspecified: Secondary | ICD-10-CM | POA: Diagnosis not present

## 2017-08-22 LAB — ESTRADIOL, ULTRA SENS: Estradiol, Sensitive: 27.1 pg/mL

## 2017-08-23 ENCOUNTER — Telehealth: Payer: Self-pay

## 2017-08-23 ENCOUNTER — Ambulatory Visit
Admission: RE | Admit: 2017-08-23 | Discharge: 2017-08-23 | Disposition: A | Discharge status: Home / Self Care | Payer: 59 | Source: Ambulatory Visit | Attending: Radiation Oncology | Admitting: Radiation Oncology

## 2017-08-23 DIAGNOSIS — F419 Anxiety disorder, unspecified: Secondary | ICD-10-CM | POA: Diagnosis not present

## 2017-08-23 DIAGNOSIS — Z8614 Personal history of Methicillin resistant Staphylococcus aureus infection: Secondary | ICD-10-CM | POA: Diagnosis not present

## 2017-08-23 DIAGNOSIS — Z170 Estrogen receptor positive status [ER+]: Secondary | ICD-10-CM | POA: Diagnosis not present

## 2017-08-23 DIAGNOSIS — F329 Major depressive disorder, single episode, unspecified: Secondary | ICD-10-CM | POA: Diagnosis not present

## 2017-08-23 DIAGNOSIS — C50411 Malignant neoplasm of upper-outer quadrant of right female breast: Secondary | ICD-10-CM | POA: Diagnosis not present

## 2017-08-23 DIAGNOSIS — Z9013 Acquired absence of bilateral breasts and nipples: Secondary | ICD-10-CM | POA: Diagnosis not present

## 2017-08-23 DIAGNOSIS — Z880 Allergy status to penicillin: Secondary | ICD-10-CM | POA: Diagnosis not present

## 2017-08-23 NOTE — Telephone Encounter (Signed)
Nutrition  Called patient at the request of Dr. Jana Hakim as patient interested in scheduling appointment with Dietitian.  Left message for patient.  Eunice Oldaker B. Zenia Resides, [place], [place] Registered Dietitian [phone] (pager)

## 2017-08-24 ENCOUNTER — Telehealth: Payer: Self-pay | Admitting: Oncology

## 2017-08-24 ENCOUNTER — Ambulatory Visit
Admission: RE | Admit: 2017-08-24 | Discharge: 2017-08-24 | Disposition: A | Discharge status: Home / Self Care | Payer: 59 | Source: Ambulatory Visit | Attending: Radiation Oncology | Admitting: Radiation Oncology

## 2017-08-24 DIAGNOSIS — F329 Major depressive disorder, single episode, unspecified: Secondary | ICD-10-CM | POA: Diagnosis not present

## 2017-08-24 DIAGNOSIS — Z9013 Acquired absence of bilateral breasts and nipples: Secondary | ICD-10-CM | POA: Diagnosis not present

## 2017-08-24 DIAGNOSIS — F419 Anxiety disorder, unspecified: Secondary | ICD-10-CM | POA: Diagnosis not present

## 2017-08-24 DIAGNOSIS — Z923 Personal history of irradiation: Secondary | ICD-10-CM | POA: Diagnosis not present

## 2017-08-24 DIAGNOSIS — Z880 Allergy status to penicillin: Secondary | ICD-10-CM | POA: Diagnosis not present

## 2017-08-24 DIAGNOSIS — C50411 Malignant neoplasm of upper-outer quadrant of right female breast: Secondary | ICD-10-CM | POA: Diagnosis not present

## 2017-08-24 DIAGNOSIS — Z8614 Personal history of Methicillin resistant Staphylococcus aureus infection: Secondary | ICD-10-CM | POA: Diagnosis not present

## 2017-08-24 DIAGNOSIS — Z170 Estrogen receptor positive status [ER+]: Secondary | ICD-10-CM | POA: Diagnosis not present

## 2017-08-24 NOTE — Telephone Encounter (Signed)
Spoke to patient regarding update to schedule per 10/26 sch message.

## 2017-08-25 ENCOUNTER — Ambulatory Visit
Admission: RE | Admit: 2017-08-25 | Discharge: 2017-08-25 | Disposition: A | Discharge status: Home / Self Care | Payer: 59 | Source: Ambulatory Visit | Attending: Radiation Oncology | Admitting: Radiation Oncology

## 2017-08-25 ENCOUNTER — Ambulatory Visit (HOSPITAL_COMMUNITY)
Admission: RE | Admit: 2017-08-25 | Discharge: 2017-08-25 | Disposition: A | Discharge status: Home / Self Care | Payer: 59 | Source: Ambulatory Visit | Attending: Oncology | Admitting: Oncology

## 2017-08-25 ENCOUNTER — Other Ambulatory Visit: Payer: Self-pay | Admitting: *Deleted

## 2017-08-25 DIAGNOSIS — C50412 Malignant neoplasm of upper-outer quadrant of left female breast: Secondary | ICD-10-CM | POA: Diagnosis not present

## 2017-08-25 DIAGNOSIS — M549 Dorsalgia, unspecified: Secondary | ICD-10-CM | POA: Diagnosis not present

## 2017-08-25 DIAGNOSIS — Z880 Allergy status to penicillin: Secondary | ICD-10-CM | POA: Diagnosis not present

## 2017-08-25 DIAGNOSIS — Z170 Estrogen receptor positive status [ER+]: Principal | ICD-10-CM

## 2017-08-25 DIAGNOSIS — Z9013 Acquired absence of bilateral breasts and nipples: Secondary | ICD-10-CM | POA: Diagnosis not present

## 2017-08-25 DIAGNOSIS — C50411 Malignant neoplasm of upper-outer quadrant of right female breast: Secondary | ICD-10-CM | POA: Diagnosis not present

## 2017-08-25 DIAGNOSIS — F419 Anxiety disorder, unspecified: Secondary | ICD-10-CM | POA: Diagnosis not present

## 2017-08-25 DIAGNOSIS — F329 Major depressive disorder, single episode, unspecified: Secondary | ICD-10-CM | POA: Diagnosis not present

## 2017-08-25 DIAGNOSIS — M546 Pain in thoracic spine: Secondary | ICD-10-CM | POA: Diagnosis not present

## 2017-08-25 DIAGNOSIS — Z8614 Personal history of Methicillin resistant Staphylococcus aureus infection: Secondary | ICD-10-CM | POA: Diagnosis not present

## 2017-08-25 NOTE — Progress Notes (Signed)
Pt called with c/o pain to spinal pain that has radiated to left rib. Per dr. Jana Hakim ordered thoracic plain films followed by MRI. Called pt with recommendations.

## 2017-08-26 ENCOUNTER — Ambulatory Visit
Admission: RE | Admit: 2017-08-26 | Discharge: 2017-08-26 | Disposition: A | Discharge status: Home / Self Care | Payer: 59 | Source: Ambulatory Visit | Attending: Radiation Oncology | Admitting: Radiation Oncology

## 2017-08-26 ENCOUNTER — Telehealth: Payer: Self-pay | Admitting: Hematology and Oncology

## 2017-08-26 DIAGNOSIS — F419 Anxiety disorder, unspecified: Secondary | ICD-10-CM | POA: Diagnosis not present

## 2017-08-26 DIAGNOSIS — C50411 Malignant neoplasm of upper-outer quadrant of right female breast: Secondary | ICD-10-CM | POA: Diagnosis not present

## 2017-08-26 DIAGNOSIS — Z8614 Personal history of Methicillin resistant Staphylococcus aureus infection: Secondary | ICD-10-CM | POA: Diagnosis not present

## 2017-08-26 DIAGNOSIS — Z9013 Acquired absence of bilateral breasts and nipples: Secondary | ICD-10-CM | POA: Diagnosis not present

## 2017-08-26 DIAGNOSIS — Z880 Allergy status to penicillin: Secondary | ICD-10-CM | POA: Diagnosis not present

## 2017-08-26 DIAGNOSIS — Z170 Estrogen receptor positive status [ER+]: Secondary | ICD-10-CM | POA: Diagnosis not present

## 2017-08-26 DIAGNOSIS — F329 Major depressive disorder, single episode, unspecified: Secondary | ICD-10-CM | POA: Diagnosis not present

## 2017-08-26 NOTE — Telephone Encounter (Signed)
LVM about upcoming MRI appointment.

## 2017-08-29 ENCOUNTER — Encounter: Payer: Self-pay | Admitting: *Deleted

## 2017-08-29 ENCOUNTER — Ambulatory Visit
Admission: RE | Admit: 2017-08-29 | Discharge: 2017-08-29 | Disposition: A | Discharge status: Home / Self Care | Payer: 59 | Source: Ambulatory Visit | Attending: Radiation Oncology | Admitting: Radiation Oncology

## 2017-08-29 ENCOUNTER — Encounter: Payer: Self-pay | Admitting: Gynecologic Oncology

## 2017-08-29 ENCOUNTER — Ambulatory Visit: Payer: 59 | Attending: Gynecologic Oncology | Admitting: Gynecologic Oncology

## 2017-08-29 VITALS — BP 100/70 | HR 68 | Temp 97.7°F | Resp 18 | Wt 127.0 lb

## 2017-08-29 DIAGNOSIS — Z882 Allergy status to sulfonamides status: Secondary | ICD-10-CM | POA: Diagnosis not present

## 2017-08-29 DIAGNOSIS — Z4002 Encounter for prophylactic removal of ovary: Secondary | ICD-10-CM

## 2017-08-29 DIAGNOSIS — F329 Major depressive disorder, single episode, unspecified: Secondary | ICD-10-CM | POA: Insufficient documentation

## 2017-08-29 DIAGNOSIS — Z888 Allergy status to other drugs, medicaments and biological substances status: Secondary | ICD-10-CM | POA: Diagnosis not present

## 2017-08-29 DIAGNOSIS — Z91048 Other nonmedicinal substance allergy status: Secondary | ICD-10-CM | POA: Diagnosis not present

## 2017-08-29 DIAGNOSIS — Z9102 Food additives allergy status: Secondary | ICD-10-CM | POA: Insufficient documentation

## 2017-08-29 DIAGNOSIS — Z853 Personal history of malignant neoplasm of breast: Secondary | ICD-10-CM | POA: Diagnosis not present

## 2017-08-29 DIAGNOSIS — Z803 Family history of malignant neoplasm of breast: Secondary | ICD-10-CM | POA: Insufficient documentation

## 2017-08-29 DIAGNOSIS — C50411 Malignant neoplasm of upper-outer quadrant of right female breast: Secondary | ICD-10-CM

## 2017-08-29 DIAGNOSIS — Z9013 Acquired absence of bilateral breasts and nipples: Secondary | ICD-10-CM | POA: Diagnosis not present

## 2017-08-29 DIAGNOSIS — Z8042 Family history of malignant neoplasm of prostate: Secondary | ICD-10-CM | POA: Insufficient documentation

## 2017-08-29 DIAGNOSIS — Z800 Family history of malignant neoplasm of digestive organs: Secondary | ICD-10-CM | POA: Diagnosis not present

## 2017-08-29 DIAGNOSIS — Z923 Personal history of irradiation: Secondary | ICD-10-CM | POA: Diagnosis not present

## 2017-08-29 DIAGNOSIS — Z791 Long term (current) use of non-steroidal anti-inflammatories (NSAID): Secondary | ICD-10-CM | POA: Diagnosis not present

## 2017-08-29 DIAGNOSIS — F419 Anxiety disorder, unspecified: Secondary | ICD-10-CM | POA: Insufficient documentation

## 2017-08-29 DIAGNOSIS — Z170 Estrogen receptor positive status [ER+]: Secondary | ICD-10-CM | POA: Diagnosis not present

## 2017-08-29 DIAGNOSIS — Z9889 Other specified postprocedural states: Secondary | ICD-10-CM | POA: Insufficient documentation

## 2017-08-29 DIAGNOSIS — Z8614 Personal history of Methicillin resistant Staphylococcus aureus infection: Secondary | ICD-10-CM | POA: Insufficient documentation

## 2017-08-29 DIAGNOSIS — Z880 Allergy status to penicillin: Secondary | ICD-10-CM | POA: Diagnosis not present

## 2017-08-29 DIAGNOSIS — Z808 Family history of malignant neoplasm of other organs or systems: Secondary | ICD-10-CM | POA: Insufficient documentation

## 2017-08-29 DIAGNOSIS — Z79899 Other long term (current) drug therapy: Secondary | ICD-10-CM | POA: Insufficient documentation

## 2017-08-29 DIAGNOSIS — Z9221 Personal history of antineoplastic chemotherapy: Secondary | ICD-10-CM | POA: Insufficient documentation

## 2017-08-29 DIAGNOSIS — Z8249 Family history of ischemic heart disease and other diseases of the circulatory system: Secondary | ICD-10-CM | POA: Diagnosis not present

## 2017-08-29 DIAGNOSIS — Z833 Family history of diabetes mellitus: Secondary | ICD-10-CM | POA: Insufficient documentation

## 2017-08-29 NOTE — Progress Notes (Signed)
Consult Note: Gyn-Onc  Consult was requested by Dr. Donne Hazel and Dr Jana Hakim for the evaluation of Katelyn Shelton 41 y.o. female  CC:  Chief Complaint  Patient presents with  . Malignant neoplasm of upper-outer quadrant of right breast i    Assessment/Plan:  Katelyn Shelton  is a 41 y.o.  year old with a history of estrogen receptor positive breast cancer. She is desiring surgical castration with BSO in order to be eligible for aromatase inhibition.  I am recommending robotic assisted laparoscopic BSO. Hysterectomy is not indicated in this situation, and the patient is not interested in this.  She completes radiation in mid December. We will plan on surgery after that point.   HPI: Katelyn Shelton is a 41 year old P3 who is seen in consultation at the request of Dr Donne Hazel and Dr Jana Hakim for estrogen receptor positive breast cancer. The patient is s/p mastectomy and chemotherapy and is completing radiation therapy in December, 2018. Reconstruction of the breasts will occur in 2019.  She is taking goserelin shots and does not like the side effects of these. She is interested in oophorectomy to induce surgical menopause so that she may be eligible for aromatase inhibition. She has been tested for BRCA germline mutations and is negative.  She has had 3 prior cesarean sections. She has had surgery on her chest wall to correct a pectus excavatum.   Current Meds:  Outpatient Encounter Medications as of 08/29/2017  Medication Sig  . acetaminophen (TYLENOL) 325 MG tablet Take 650 mg by mouth every 6 (six) hours as needed.  . doxycycline (DORYX) 100 MG EC tablet Take 100 mg by mouth daily.  . fluticasone (FLONASE) 50 MCG/ACT nasal spray Place 1 spray into both nostrils daily as needed.  . hyaluronate sodium (RADIAPLEXRX) GEL Apply 1 application topically 2 (two) times daily.  [name] LORazepam (ATIVAN) 0.5 MG tablet TAKE 1/2 TO 1 TABLET BY MOUTH AT BEDTIME AS NEEDED FOR ANXIETY  .  non-metallic deodorant (ALRA) MISC Apply 1 application topically daily as needed.  . rosuvastatin (CRESTOR) 5 MG tablet Take 1 tablet (5 mg total) by mouth daily.  . tamoxifen (NOLVADEX) 10 MG tablet Take 10 mg by mouth 2 (two) times daily.  [name] venlafaxine XR (EFFEXOR-XR) 75 MG 24 hr capsule Take 1 capsule (75 mg total) by mouth daily with breakfast.  . [DISCONTINUED] dexamethasone (DECADRON) 4 MG tablet Take 2 tablets (8 mg total) by mouth 2 (two) times daily. Start the day before Taxotere. Then again the day after chemo for 3 days.  . [DISCONTINUED] ibuprofen (ADVIL,MOTRIN) 200 MG tablet Take 200 mg by mouth every 6 (six) hours as needed.  . [DISCONTINUED] LORazepam (ATIVAN) 0.5 MG tablet Take 1 tablet (0.5 mg total) by mouth at bedtime as needed (Nausea or vomiting).  . [DISCONTINUED] ondansetron (ZOFRAN) 8 MG tablet Take 1 tablet (8 mg total) by mouth 2 (two) times daily as needed for refractory nausea / vomiting. Start on day 3 after chemo. (Patient not taking: Reported on 08/09/2017)  . [DISCONTINUED] prochlorperazine (COMPAZINE) 10 MG tablet Take 1 tablet (10 mg total) by mouth every 6 (six) hours as needed (Nausea or vomiting). (Patient not taking: Reported on 08/09/2017)   No facility-administered encounter medications on file as of 08/29/2017.     Allergy:  Allergies  Allergen Reactions  . Peach Flavor Anaphylaxis  . Fentanyl Itching  . Penicillins Hives    AS A CHILD  . Sulfa Antibiotics Nausea And Vomiting  .  Sulfasalazine Nausea And Vomiting and Other (See Comments)  . Latex Itching    Social Hx:   Social History   Socioeconomic History  . Marital status: Married    Spouse name: Not on file  . Number of children: Not on file  . Years of education: Not on file  . Highest education level: Not on file  Social Needs  . Financial resource strain: Not on file  . Food insecurity - worry: Not on file  . Food insecurity - inability: Not on file  . Transportation needs -  medical: Not on file  . Transportation needs - non-medical: Not on file  Occupational History  . Not on file  Tobacco Use  . Smoking status: Never Smoker  . Smokeless tobacco: Never Used  Substance and Sexual Activity  . Alcohol use: No  . Drug use: No  . Sexual activity: Yes  Other Topics Concern  . Not on file  Social History Narrative  . Not on file    Past Surgical Hx:  Past Surgical History:  Procedure Laterality Date  . CESAREAN SECTION     x 2 (3 total)  . DILATION AND CURETTAGE OF UTERUS    . PECTUS EXCAVATUM REPAIR  1996  . VULVAR LESION REMOVAL    . WISDOM TOOTH EXTRACTION      Past Medical Hx:  Past Medical History:  Diagnosis Date  . Anxiety   . Breast cancer ([place]) 01/2017   right; genetic testing negative in 2018 with [employer] panel  . Depression   . History of MRSA infection    elbow    Past Gynecological History:  No abnormal paps No LMP recorded.  Family Hx:  Family History  Problem Relation Age of Onset  . Skin cancer Mother   . Parkinson's disease Sister 51  . Diabetes Maternal Aunt   . Heart failure Maternal Grandmother   . Heart attack Maternal Grandfather   . Stomach cancer Paternal Grandmother   . Prostate cancer Paternal Grandfather   . Alzheimer's disease Paternal Grandfather   . Breast cancer Cousin        mother's paternal first FEMALE cousin    Review of Systems:  Constitutional  Feels well,    ENT Normal appearing ears and nares bilaterally Skin/Breast  No rash, sores, jaundice, itching, dryness Cardiovascular  No chest pain, shortness of breath, or edema  Pulmonary  No cough or wheeze.  Gastro Intestinal  No nausea, vomitting, or diarrhoea. No bright red blood per rectum, no abdominal pain, change in bowel movement, or constipation.  Genito Urinary  No frequency, urgency, dysuria, no abnormal bleeding Musculo Skeletal  No myalgia, arthralgia, joint swelling or pain  Neurologic  No weakness, numbness, change in gait,   Psychology  No depression, anxiety, insomnia.   Vitals:  Blood pressure 100/70, pulse 68, temperature 97.7 F (36.5 C), temperature source Oral, resp. rate 18, weight 127 lb (57.6 kg), SpO2 100 %.  Physical Exam: WD in NAD Neck  Supple NROM, without any enlargements.  Lymph Node Survey No cervical supraclavicular or inguinal adenopathy Cardiovascular  Pulse normal rate, regularity and rhythm. S1 and S2 normal.  Lungs  Clear to auscultation bilateraly, without wheezes/crackles/rhonchi. Good air movement.  Skin  No rash/lesions/breakdown  Psychiatry  Alert and oriented to person, place, and time  Abdomen  Normoactive bowel sounds, abdomen soft, non-tender and thin without evidence of hernia.  Back No CVA tenderness Genito Urinary  Vulva/vagina: Normal external female genitalia.  No lesions.  No discharge or bleeding.  Bladder/urethra:  No lesions or masses, well supported bladder  Vagina: normal, narrow  Cervix: Normal appearing, no lesions.  Uterus:  Small, mobile, no parametrial involvement or nodularity.  Adnexa: no palpable masses. Rectal  deferred  Extremities  No bilateral cyanosis, clubbing or edema.   Donaciano Eva, MD  08/29/2017, 4:20 PM

## 2017-08-29 NOTE — Patient Instructions (Signed)
Preparing for your Surgery  Plan for surgery on December 18 with Dr. Everitt Amber at [facility] will be scheduled for a robotic assisted bilateral salpingo-oophorectomy.   Pre-operative Testing -You will receive a phone call from presurgical testing at [facility] to arrange for a pre-operative testing appointment before your surgery.  This appointment normally occurs one to two weeks before your scheduled surgery.   -Bring your insurance card, copy of an advanced directive if applicable, medication list  -At that visit, you will be asked to sign a consent for a possible blood transfusion in case a transfusion becomes necessary during surgery.  The need for a blood transfusion is rare but having consent is a necessary part of your care.     -You should not be taking blood thinners or aspirin at least ten days prior to surgery unless instructed by your surgeon.  Day Before Surgery at [place] will be asked to take in a light diet the day before surgery.  Avoid carbonated beverages.  You will be advised to have nothing to eat or drink after midnight the evening before.    Eat a light diet the day before surgery.  Examples including soups, broths, toast, yogurt, mashed potatoes.  Things to avoid include carbonated beverages  (fizzy beverages), raw fruits and raw vegetables, or beans.   If your bowels are filled with gas, your surgeon will have difficulty visualizing your pelvic organs which increases your surgical risks.  Your role in recovery Your role is to become active as soon as directed by your doctor, while still giving yourself time to heal.  Rest when you feel tired. You will be asked to do the following in order to speed your recovery:  - Cough and breathe deeply. This helps toclear and expand your lungs and can prevent pneumonia. You may be given a spirometer to practice deep breathing. A staff member will show you how to use the spirometer. - Do  mild physical activity. Walking or moving your legs help your circulation and body functions return to normal. A staff member will help you when you try to walk and will provide you with simple exercises. Do not try to get up or walk alone the first time. - Actively manage your pain. Managing your pain lets you move in comfort. We will ask you to rate your pain on a scale of zero to 10. It is your responsibility to tell your doctor or nurse where and how much you hurt so your pain can be treated.  Special Considerations -If you are diabetic, you may be placed on insulin after surgery to have closer control over your blood sugars to promote healing and recovery.  This does not mean that you will be discharged on insulin.  If applicable, your oral antidiabetics will be resumed when you are tolerating a solid diet.  -Your final pathology results from surgery should be available by the Friday after surgery and the results will be relayed to you when available.  -Dr. Lahoma Crocker is the Surgeon that assists your GYN Oncologist with surgery.  The next day after your surgery you will either see your GYN Oncologist or Dr. Lahoma Crocker.   Blood Transfusion Information WHAT IS A BLOOD TRANSFUSION? A transfusion is the replacement of blood or some of its parts. Blood is made up of multiple cells which provide different functions.  Red blood cells carry oxygen and are used for blood loss replacement.  White blood  cells fight against infection.  Platelets control bleeding.  Plasma helps clot blood.  Other blood products are available for specialized needs, such as hemophilia or other clotting disorders. BEFORE THE TRANSFUSION  Who gives blood for transfusions?   You may be able to donate blood to be used at a later date on yourself (autologous donation).  Relatives can be asked to donate blood. This is generally not any safer than if you have received blood from a stranger. The same  precautions are taken to ensure safety when a relative's blood is donated.  Healthy volunteers who are fully evaluated to make sure their blood is safe. This is blood bank blood. Transfusion therapy is the safest it has ever been in the practice of medicine. Before blood is taken from a donor, a complete history is taken to make sure that person has no history of diseases nor engages in risky social behavior (examples are intravenous drug use or sexual activity with multiple partners). The donor's travel history is screened to minimize risk of transmitting infections, such as malaria. The donated blood is tested for signs of infectious diseases, such as HIV and hepatitis. The blood is then tested to be sure it is compatible with you in order to minimize the chance of a transfusion reaction. If you or a relative donates blood, this is often done in anticipation of surgery and is not appropriate for emergency situations. It takes many days to process the donated blood. RISKS AND COMPLICATIONS Although transfusion therapy is very safe and saves many lives, the main dangers of transfusion include:   Getting an infectious disease.  Developing a transfusion reaction. This is an allergic reaction to something in the blood you were given. Every precaution is taken to prevent this. The decision to have a blood transfusion has been considered carefully by your caregiver before blood is given. Blood is not given unless the benefits outweigh the risks.

## 2017-08-30 ENCOUNTER — Ambulatory Visit
Admission: RE | Admit: 2017-08-30 | Discharge: 2017-08-30 | Disposition: A | Discharge status: Home / Self Care | Payer: 59 | Source: Ambulatory Visit | Attending: Radiation Oncology | Admitting: Radiation Oncology

## 2017-08-30 DIAGNOSIS — Z170 Estrogen receptor positive status [ER+]: Secondary | ICD-10-CM | POA: Diagnosis not present

## 2017-08-30 DIAGNOSIS — F419 Anxiety disorder, unspecified: Secondary | ICD-10-CM | POA: Diagnosis not present

## 2017-08-30 DIAGNOSIS — C50411 Malignant neoplasm of upper-outer quadrant of right female breast: Secondary | ICD-10-CM | POA: Diagnosis not present

## 2017-08-30 DIAGNOSIS — Z9013 Acquired absence of bilateral breasts and nipples: Secondary | ICD-10-CM | POA: Diagnosis not present

## 2017-08-30 DIAGNOSIS — Z8614 Personal history of Methicillin resistant Staphylococcus aureus infection: Secondary | ICD-10-CM | POA: Diagnosis not present

## 2017-08-30 DIAGNOSIS — Z880 Allergy status to penicillin: Secondary | ICD-10-CM | POA: Diagnosis not present

## 2017-08-30 DIAGNOSIS — F329 Major depressive disorder, single episode, unspecified: Secondary | ICD-10-CM | POA: Diagnosis not present

## 2017-08-31 ENCOUNTER — Ambulatory Visit
Admission: RE | Admit: 2017-08-31 | Discharge: 2017-08-31 | Disposition: A | Discharge status: Home / Self Care | Payer: 59 | Source: Ambulatory Visit | Attending: Radiation Oncology | Admitting: Radiation Oncology

## 2017-08-31 DIAGNOSIS — Z8614 Personal history of Methicillin resistant Staphylococcus aureus infection: Secondary | ICD-10-CM | POA: Diagnosis not present

## 2017-08-31 DIAGNOSIS — Z880 Allergy status to penicillin: Secondary | ICD-10-CM | POA: Diagnosis not present

## 2017-08-31 DIAGNOSIS — Z170 Estrogen receptor positive status [ER+]: Secondary | ICD-10-CM | POA: Diagnosis not present

## 2017-08-31 DIAGNOSIS — Z9013 Acquired absence of bilateral breasts and nipples: Secondary | ICD-10-CM | POA: Diagnosis not present

## 2017-08-31 DIAGNOSIS — C50411 Malignant neoplasm of upper-outer quadrant of right female breast: Secondary | ICD-10-CM | POA: Diagnosis not present

## 2017-08-31 DIAGNOSIS — F419 Anxiety disorder, unspecified: Secondary | ICD-10-CM | POA: Diagnosis not present

## 2017-08-31 DIAGNOSIS — F329 Major depressive disorder, single episode, unspecified: Secondary | ICD-10-CM | POA: Diagnosis not present

## 2017-09-01 ENCOUNTER — Encounter (HOSPITAL_COMMUNITY): Payer: Self-pay

## 2017-09-01 ENCOUNTER — Ambulatory Visit (HOSPITAL_COMMUNITY)
Admission: RE | Admit: 2017-09-01 | Discharge: 2017-09-01 | Disposition: A | Discharge status: Home / Self Care | Payer: 59 | Source: Ambulatory Visit | Attending: Oncology | Admitting: Oncology

## 2017-09-01 ENCOUNTER — Ambulatory Visit
Admission: RE | Admit: 2017-09-01 | Discharge: 2017-09-01 | Disposition: A | Discharge status: Home / Self Care | Payer: 59 | Source: Ambulatory Visit | Attending: Radiation Oncology | Admitting: Radiation Oncology

## 2017-09-01 ENCOUNTER — Other Ambulatory Visit: Payer: Self-pay | Admitting: *Deleted

## 2017-09-01 DIAGNOSIS — Z170 Estrogen receptor positive status [ER+]: Secondary | ICD-10-CM | POA: Insufficient documentation

## 2017-09-01 DIAGNOSIS — C50412 Malignant neoplasm of upper-outer quadrant of left female breast: Secondary | ICD-10-CM | POA: Diagnosis not present

## 2017-09-01 DIAGNOSIS — Z9013 Acquired absence of bilateral breasts and nipples: Secondary | ICD-10-CM | POA: Diagnosis not present

## 2017-09-01 DIAGNOSIS — F419 Anxiety disorder, unspecified: Secondary | ICD-10-CM | POA: Diagnosis not present

## 2017-09-01 DIAGNOSIS — F329 Major depressive disorder, single episode, unspecified: Secondary | ICD-10-CM | POA: Diagnosis not present

## 2017-09-01 DIAGNOSIS — Z8614 Personal history of Methicillin resistant Staphylococcus aureus infection: Secondary | ICD-10-CM | POA: Diagnosis not present

## 2017-09-01 DIAGNOSIS — C50411 Malignant neoplasm of upper-outer quadrant of right female breast: Secondary | ICD-10-CM

## 2017-09-01 DIAGNOSIS — M545 Low back pain: Secondary | ICD-10-CM | POA: Diagnosis not present

## 2017-09-01 DIAGNOSIS — Z880 Allergy status to penicillin: Secondary | ICD-10-CM | POA: Diagnosis not present

## 2017-09-01 DIAGNOSIS — M546 Pain in thoracic spine: Secondary | ICD-10-CM | POA: Diagnosis not present

## 2017-09-01 MED ORDER — IOPAMIDOL (ISOVUE-300) INJECTION 61%
INTRAVENOUS | Status: AC
  Administered 2017-09-01: 100 mL via INTRAVENOUS
  Filled 2017-09-01: qty 100

## 2017-09-01 MED ORDER — IOPAMIDOL (ISOVUE-300) INJECTION 61%
100.0000 mL | Freq: Once | INTRAVENOUS | Status: AC | PRN
  Administered 2017-09-01: 100 mL via INTRAVENOUS

## 2017-09-02 ENCOUNTER — Ambulatory Visit
Admission: RE | Admit: 2017-09-02 | Discharge: 2017-09-02 | Disposition: A | Discharge status: Home / Self Care | Payer: 59 | Source: Ambulatory Visit | Attending: Radiation Oncology | Admitting: Radiation Oncology

## 2017-09-02 DIAGNOSIS — C50411 Malignant neoplasm of upper-outer quadrant of right female breast: Secondary | ICD-10-CM | POA: Diagnosis not present

## 2017-09-02 DIAGNOSIS — Z8614 Personal history of Methicillin resistant Staphylococcus aureus infection: Secondary | ICD-10-CM | POA: Diagnosis not present

## 2017-09-02 DIAGNOSIS — Z9013 Acquired absence of bilateral breasts and nipples: Secondary | ICD-10-CM | POA: Diagnosis not present

## 2017-09-02 DIAGNOSIS — Z880 Allergy status to penicillin: Secondary | ICD-10-CM | POA: Diagnosis not present

## 2017-09-02 DIAGNOSIS — F419 Anxiety disorder, unspecified: Secondary | ICD-10-CM | POA: Diagnosis not present

## 2017-09-02 DIAGNOSIS — Z170 Estrogen receptor positive status [ER+]: Secondary | ICD-10-CM | POA: Diagnosis not present

## 2017-09-02 DIAGNOSIS — F329 Major depressive disorder, single episode, unspecified: Secondary | ICD-10-CM | POA: Diagnosis not present

## 2017-09-05 ENCOUNTER — Ambulatory Visit
Admission: RE | Admit: 2017-09-05 | Discharge: 2017-09-05 | Disposition: A | Discharge status: Home / Self Care | Payer: 59 | Source: Ambulatory Visit | Attending: Radiation Oncology | Admitting: Radiation Oncology

## 2017-09-05 DIAGNOSIS — F419 Anxiety disorder, unspecified: Secondary | ICD-10-CM | POA: Diagnosis not present

## 2017-09-05 DIAGNOSIS — Z8614 Personal history of Methicillin resistant Staphylococcus aureus infection: Secondary | ICD-10-CM | POA: Diagnosis not present

## 2017-09-05 DIAGNOSIS — Z880 Allergy status to penicillin: Secondary | ICD-10-CM | POA: Diagnosis not present

## 2017-09-05 DIAGNOSIS — Z9013 Acquired absence of bilateral breasts and nipples: Secondary | ICD-10-CM | POA: Diagnosis not present

## 2017-09-05 DIAGNOSIS — F329 Major depressive disorder, single episode, unspecified: Secondary | ICD-10-CM | POA: Diagnosis not present

## 2017-09-05 DIAGNOSIS — Z170 Estrogen receptor positive status [ER+]: Secondary | ICD-10-CM | POA: Diagnosis not present

## 2017-09-05 DIAGNOSIS — C50411 Malignant neoplasm of upper-outer quadrant of right female breast: Secondary | ICD-10-CM | POA: Diagnosis not present

## 2017-09-06 ENCOUNTER — Encounter: Payer: Self-pay | Admitting: *Deleted

## 2017-09-06 ENCOUNTER — Ambulatory Visit
Admission: RE | Admit: 2017-09-06 | Discharge: 2017-09-06 | Disposition: A | Discharge status: Home / Self Care | Payer: 59 | Source: Ambulatory Visit | Attending: Radiation Oncology | Admitting: Radiation Oncology

## 2017-09-06 DIAGNOSIS — Z8614 Personal history of Methicillin resistant Staphylococcus aureus infection: Secondary | ICD-10-CM | POA: Diagnosis not present

## 2017-09-06 DIAGNOSIS — Z170 Estrogen receptor positive status [ER+]: Secondary | ICD-10-CM | POA: Diagnosis not present

## 2017-09-06 DIAGNOSIS — F329 Major depressive disorder, single episode, unspecified: Secondary | ICD-10-CM | POA: Diagnosis not present

## 2017-09-06 DIAGNOSIS — F419 Anxiety disorder, unspecified: Secondary | ICD-10-CM | POA: Diagnosis not present

## 2017-09-06 DIAGNOSIS — C50411 Malignant neoplasm of upper-outer quadrant of right female breast: Secondary | ICD-10-CM | POA: Diagnosis not present

## 2017-09-06 DIAGNOSIS — Z880 Allergy status to penicillin: Secondary | ICD-10-CM | POA: Diagnosis not present

## 2017-09-06 DIAGNOSIS — Z9013 Acquired absence of bilateral breasts and nipples: Secondary | ICD-10-CM | POA: Diagnosis not present

## 2017-09-07 ENCOUNTER — Ambulatory Visit
Admission: RE | Admit: 2017-09-07 | Discharge: 2017-09-07 | Disposition: A | Discharge status: Home / Self Care | Payer: 59 | Source: Ambulatory Visit | Attending: Radiation Oncology | Admitting: Radiation Oncology

## 2017-09-07 DIAGNOSIS — Z91040 Latex allergy status: Secondary | ICD-10-CM | POA: Diagnosis not present

## 2017-09-07 DIAGNOSIS — Z79899 Other long term (current) drug therapy: Secondary | ICD-10-CM | POA: Diagnosis not present

## 2017-09-07 DIAGNOSIS — C50411 Malignant neoplasm of upper-outer quadrant of right female breast: Secondary | ICD-10-CM | POA: Diagnosis not present

## 2017-09-07 DIAGNOSIS — C50911 Malignant neoplasm of unspecified site of right female breast: Secondary | ICD-10-CM | POA: Diagnosis not present

## 2017-09-07 DIAGNOSIS — Z882 Allergy status to sulfonamides status: Secondary | ICD-10-CM | POA: Diagnosis not present

## 2017-09-07 DIAGNOSIS — Z79811 Long term (current) use of aromatase inhibitors: Secondary | ICD-10-CM | POA: Insufficient documentation

## 2017-09-07 DIAGNOSIS — Z170 Estrogen receptor positive status [ER+]: Secondary | ICD-10-CM | POA: Insufficient documentation

## 2017-09-07 DIAGNOSIS — Z510 Encounter for antineoplastic radiation therapy: Secondary | ICD-10-CM | POA: Insufficient documentation

## 2017-09-07 DIAGNOSIS — Z885 Allergy status to narcotic agent status: Secondary | ICD-10-CM | POA: Insufficient documentation

## 2017-09-07 DIAGNOSIS — Z888 Allergy status to other drugs, medicaments and biological substances status: Secondary | ICD-10-CM | POA: Insufficient documentation

## 2017-09-07 DIAGNOSIS — Z880 Allergy status to penicillin: Secondary | ICD-10-CM | POA: Insufficient documentation

## 2017-09-08 ENCOUNTER — Encounter: Payer: Self-pay | Admitting: Nutrition

## 2017-09-08 ENCOUNTER — Ambulatory Visit
Admission: RE | Admit: 2017-09-08 | Discharge: 2017-09-08 | Disposition: A | Discharge status: Home / Self Care | Payer: 59 | Source: Ambulatory Visit | Attending: Radiation Oncology | Admitting: Radiation Oncology

## 2017-09-08 DIAGNOSIS — Z882 Allergy status to sulfonamides status: Secondary | ICD-10-CM | POA: Diagnosis not present

## 2017-09-08 DIAGNOSIS — Z880 Allergy status to penicillin: Secondary | ICD-10-CM | POA: Diagnosis not present

## 2017-09-08 DIAGNOSIS — Z888 Allergy status to other drugs, medicaments and biological substances status: Secondary | ICD-10-CM | POA: Diagnosis not present

## 2017-09-08 DIAGNOSIS — Z510 Encounter for antineoplastic radiation therapy: Secondary | ICD-10-CM | POA: Diagnosis not present

## 2017-09-08 DIAGNOSIS — Z91040 Latex allergy status: Secondary | ICD-10-CM | POA: Diagnosis not present

## 2017-09-08 DIAGNOSIS — Z79811 Long term (current) use of aromatase inhibitors: Secondary | ICD-10-CM | POA: Diagnosis not present

## 2017-09-08 DIAGNOSIS — C50911 Malignant neoplasm of unspecified site of right female breast: Secondary | ICD-10-CM | POA: Diagnosis not present

## 2017-09-08 DIAGNOSIS — C50411 Malignant neoplasm of upper-outer quadrant of right female breast: Secondary | ICD-10-CM | POA: Diagnosis not present

## 2017-09-08 DIAGNOSIS — Z170 Estrogen receptor positive status [ER+]: Secondary | ICD-10-CM | POA: Diagnosis not present

## 2017-09-08 DIAGNOSIS — Z79899 Other long term (current) drug therapy: Secondary | ICD-10-CM | POA: Diagnosis not present

## 2017-09-09 ENCOUNTER — Ambulatory Visit
Admission: RE | Admit: 2017-09-09 | Discharge: 2017-09-09 | Disposition: A | Discharge status: Home / Self Care | Payer: 59 | Source: Ambulatory Visit | Attending: Radiation Oncology | Admitting: Radiation Oncology

## 2017-09-09 DIAGNOSIS — Z882 Allergy status to sulfonamides status: Secondary | ICD-10-CM | POA: Diagnosis not present

## 2017-09-09 DIAGNOSIS — Z170 Estrogen receptor positive status [ER+]: Secondary | ICD-10-CM | POA: Diagnosis not present

## 2017-09-09 DIAGNOSIS — Z79899 Other long term (current) drug therapy: Secondary | ICD-10-CM | POA: Diagnosis not present

## 2017-09-09 DIAGNOSIS — C50411 Malignant neoplasm of upper-outer quadrant of right female breast: Secondary | ICD-10-CM | POA: Diagnosis not present

## 2017-09-09 DIAGNOSIS — Z510 Encounter for antineoplastic radiation therapy: Secondary | ICD-10-CM | POA: Diagnosis not present

## 2017-09-09 DIAGNOSIS — Z79811 Long term (current) use of aromatase inhibitors: Secondary | ICD-10-CM | POA: Diagnosis not present

## 2017-09-09 DIAGNOSIS — Z880 Allergy status to penicillin: Secondary | ICD-10-CM | POA: Diagnosis not present

## 2017-09-09 DIAGNOSIS — Z888 Allergy status to other drugs, medicaments and biological substances status: Secondary | ICD-10-CM | POA: Diagnosis not present

## 2017-09-09 DIAGNOSIS — Z91040 Latex allergy status: Secondary | ICD-10-CM | POA: Diagnosis not present

## 2017-09-09 DIAGNOSIS — C50911 Malignant neoplasm of unspecified site of right female breast: Secondary | ICD-10-CM | POA: Diagnosis not present

## 2017-09-11 ENCOUNTER — Ambulatory Visit
Admission: RE | Admit: 2017-09-11 | Discharge: 2017-09-11 | Disposition: A | Discharge status: Home / Self Care | Payer: 59 | Source: Ambulatory Visit | Attending: Radiation Oncology | Admitting: Radiation Oncology

## 2017-09-11 DIAGNOSIS — C50911 Malignant neoplasm of unspecified site of right female breast: Secondary | ICD-10-CM | POA: Diagnosis not present

## 2017-09-11 DIAGNOSIS — Z888 Allergy status to other drugs, medicaments and biological substances status: Secondary | ICD-10-CM | POA: Diagnosis not present

## 2017-09-11 DIAGNOSIS — Z79899 Other long term (current) drug therapy: Secondary | ICD-10-CM | POA: Diagnosis not present

## 2017-09-11 DIAGNOSIS — Z91040 Latex allergy status: Secondary | ICD-10-CM | POA: Diagnosis not present

## 2017-09-11 DIAGNOSIS — Z79811 Long term (current) use of aromatase inhibitors: Secondary | ICD-10-CM | POA: Diagnosis not present

## 2017-09-11 DIAGNOSIS — Z880 Allergy status to penicillin: Secondary | ICD-10-CM | POA: Diagnosis not present

## 2017-09-11 DIAGNOSIS — Z170 Estrogen receptor positive status [ER+]: Secondary | ICD-10-CM | POA: Diagnosis not present

## 2017-09-11 DIAGNOSIS — Z510 Encounter for antineoplastic radiation therapy: Secondary | ICD-10-CM | POA: Diagnosis not present

## 2017-09-11 DIAGNOSIS — Z882 Allergy status to sulfonamides status: Secondary | ICD-10-CM | POA: Diagnosis not present

## 2017-09-11 DIAGNOSIS — C50411 Malignant neoplasm of upper-outer quadrant of right female breast: Secondary | ICD-10-CM | POA: Diagnosis not present

## 2017-09-12 ENCOUNTER — Ambulatory Visit
Admission: RE | Admit: 2017-09-12 | Discharge: 2017-09-12 | Disposition: A | Discharge status: Home / Self Care | Payer: 59 | Source: Ambulatory Visit | Attending: Radiation Oncology | Admitting: Radiation Oncology

## 2017-09-12 DIAGNOSIS — Z79811 Long term (current) use of aromatase inhibitors: Secondary | ICD-10-CM | POA: Diagnosis not present

## 2017-09-12 DIAGNOSIS — Z91040 Latex allergy status: Secondary | ICD-10-CM | POA: Diagnosis not present

## 2017-09-12 DIAGNOSIS — C50911 Malignant neoplasm of unspecified site of right female breast: Secondary | ICD-10-CM | POA: Diagnosis not present

## 2017-09-12 DIAGNOSIS — Z888 Allergy status to other drugs, medicaments and biological substances status: Secondary | ICD-10-CM | POA: Diagnosis not present

## 2017-09-12 DIAGNOSIS — Z880 Allergy status to penicillin: Secondary | ICD-10-CM | POA: Diagnosis not present

## 2017-09-12 DIAGNOSIS — Z170 Estrogen receptor positive status [ER+]: Secondary | ICD-10-CM | POA: Diagnosis not present

## 2017-09-12 DIAGNOSIS — C50411 Malignant neoplasm of upper-outer quadrant of right female breast: Secondary | ICD-10-CM | POA: Diagnosis not present

## 2017-09-12 DIAGNOSIS — Z79899 Other long term (current) drug therapy: Secondary | ICD-10-CM | POA: Diagnosis not present

## 2017-09-12 DIAGNOSIS — Z882 Allergy status to sulfonamides status: Secondary | ICD-10-CM | POA: Diagnosis not present

## 2017-09-12 DIAGNOSIS — Z510 Encounter for antineoplastic radiation therapy: Secondary | ICD-10-CM | POA: Diagnosis not present

## 2017-09-13 ENCOUNTER — Ambulatory Visit
Admission: RE | Admit: 2017-09-13 | Discharge: 2017-09-13 | Disposition: A | Discharge status: Home / Self Care | Payer: 59 | Source: Ambulatory Visit | Attending: Radiation Oncology | Admitting: Radiation Oncology

## 2017-09-13 ENCOUNTER — Ambulatory Visit: Payer: 59

## 2017-09-13 DIAGNOSIS — Z79811 Long term (current) use of aromatase inhibitors: Secondary | ICD-10-CM | POA: Diagnosis not present

## 2017-09-13 DIAGNOSIS — Z880 Allergy status to penicillin: Secondary | ICD-10-CM | POA: Diagnosis not present

## 2017-09-13 DIAGNOSIS — Z79899 Other long term (current) drug therapy: Secondary | ICD-10-CM | POA: Diagnosis not present

## 2017-09-13 DIAGNOSIS — C50911 Malignant neoplasm of unspecified site of right female breast: Secondary | ICD-10-CM | POA: Diagnosis not present

## 2017-09-13 DIAGNOSIS — Z170 Estrogen receptor positive status [ER+]: Secondary | ICD-10-CM | POA: Diagnosis not present

## 2017-09-13 DIAGNOSIS — Z882 Allergy status to sulfonamides status: Secondary | ICD-10-CM | POA: Diagnosis not present

## 2017-09-13 DIAGNOSIS — C50411 Malignant neoplasm of upper-outer quadrant of right female breast: Secondary | ICD-10-CM | POA: Diagnosis not present

## 2017-09-13 DIAGNOSIS — Z510 Encounter for antineoplastic radiation therapy: Secondary | ICD-10-CM | POA: Diagnosis not present

## 2017-09-13 DIAGNOSIS — Z888 Allergy status to other drugs, medicaments and biological substances status: Secondary | ICD-10-CM | POA: Diagnosis not present

## 2017-09-13 DIAGNOSIS — Z91040 Latex allergy status: Secondary | ICD-10-CM | POA: Diagnosis not present

## 2017-09-13 NOTE — Progress Notes (Addendum)
Nutrition Assessment   Reason for Assessment:   Weight loss  ASSESSMENT:  41 year old female with right breast cancer.  Patient s/p mastectomy and chemotherapy.  Patient to conclude radiation therapy in December.  Planning lap BSO in January 2019.    Met with patient and aunt in clinic this am.  Patient reports that she is really interested in learning about foods that will help her cancer and prevent cancer from coming back.  Also wanting a written meal plan that she can follow on a daily basis.  Concerned about weight gain during chemotherapy and does not want to continue to gain weight. Patient tearful during visit.  "I don't even know what to eat."  Medications: reviewed  Labs: reviewed  Anthropometrics:   Height: 60 inches Weight: 127 lb. Reports weight in July was 116 lb UBW: 110s BMI: 24   NUTRITION DIAGNOSIS: Food and nutrition related knowledge deficit related to breast cancer as evidenced by no prior knowledge   MALNUTRITION DIAGNOSIS: none at this time   INTERVENTION:   Discussed current nutrition recommendations for breast cancer patients.  Encouraged fruits and vegetables, whole grains, lean proteins (discussed plant base proteins and animal sources).   Encouraged patient to visit choosemyplate.gov(interactive website) to learn more about reaching weight loss goals. Recommend patient to have Dr. Candace Gallus to refer to [facility] for more weight loss support.   Provided patient with reliable website to visit regarding nutrition information.  Patient has contact information.      MONITORING, EVALUATION, GOAL: Patient will consume a healthy plant based diet to maintain health   NEXT VISIT: Nutrition diagnosis resolved with education today.  No further intervention needed at this time.    Ozro Russett B. Zenia Resides, [place], [place] Registered Dietitian [phone] (pager)

## 2017-09-14 ENCOUNTER — Ambulatory Visit
Admission: RE | Admit: 2017-09-14 | Discharge: 2017-09-14 | Disposition: A | Discharge status: Home / Self Care | Payer: 59 | Source: Ambulatory Visit | Attending: Radiation Oncology | Admitting: Radiation Oncology

## 2017-09-14 DIAGNOSIS — Z170 Estrogen receptor positive status [ER+]: Secondary | ICD-10-CM | POA: Diagnosis not present

## 2017-09-14 DIAGNOSIS — Z888 Allergy status to other drugs, medicaments and biological substances status: Secondary | ICD-10-CM | POA: Diagnosis not present

## 2017-09-14 DIAGNOSIS — C50411 Malignant neoplasm of upper-outer quadrant of right female breast: Secondary | ICD-10-CM | POA: Diagnosis not present

## 2017-09-14 DIAGNOSIS — Z91040 Latex allergy status: Secondary | ICD-10-CM | POA: Diagnosis not present

## 2017-09-14 DIAGNOSIS — Z79811 Long term (current) use of aromatase inhibitors: Secondary | ICD-10-CM | POA: Diagnosis not present

## 2017-09-14 DIAGNOSIS — Z510 Encounter for antineoplastic radiation therapy: Secondary | ICD-10-CM | POA: Diagnosis not present

## 2017-09-14 DIAGNOSIS — Z880 Allergy status to penicillin: Secondary | ICD-10-CM | POA: Diagnosis not present

## 2017-09-14 DIAGNOSIS — Z882 Allergy status to sulfonamides status: Secondary | ICD-10-CM | POA: Diagnosis not present

## 2017-09-14 DIAGNOSIS — C50911 Malignant neoplasm of unspecified site of right female breast: Secondary | ICD-10-CM | POA: Diagnosis not present

## 2017-09-14 DIAGNOSIS — Z79899 Other long term (current) drug therapy: Secondary | ICD-10-CM | POA: Diagnosis not present

## 2017-09-14 NOTE — Progress Notes (Signed)
Nutrition  Mailed sample menus per patient request.  Bolden Hagerman B. Zenia Resides, [place], [place] Registered Dietitian [phone] (pager)

## 2017-09-16 ENCOUNTER — Ambulatory Visit: Payer: Self-pay

## 2017-09-19 ENCOUNTER — Ambulatory Visit (HOSPITAL_BASED_OUTPATIENT_CLINIC_OR_DEPARTMENT_OTHER): Payer: 59

## 2017-09-19 ENCOUNTER — Ambulatory Visit
Admission: RE | Admit: 2017-09-19 | Discharge: 2017-09-19 | Disposition: A | Discharge status: Home / Self Care | Payer: 59 | Source: Ambulatory Visit | Attending: Radiation Oncology | Admitting: Radiation Oncology

## 2017-09-19 VITALS — BP 104/72 | HR 68 | Temp 97.6°F | Resp 20

## 2017-09-19 DIAGNOSIS — Z91040 Latex allergy status: Secondary | ICD-10-CM | POA: Diagnosis not present

## 2017-09-19 DIAGNOSIS — Z888 Allergy status to other drugs, medicaments and biological substances status: Secondary | ICD-10-CM | POA: Diagnosis not present

## 2017-09-19 DIAGNOSIS — C50411 Malignant neoplasm of upper-outer quadrant of right female breast: Secondary | ICD-10-CM

## 2017-09-19 DIAGNOSIS — Z880 Allergy status to penicillin: Secondary | ICD-10-CM | POA: Diagnosis not present

## 2017-09-19 DIAGNOSIS — Z79899 Other long term (current) drug therapy: Secondary | ICD-10-CM | POA: Diagnosis not present

## 2017-09-19 DIAGNOSIS — Z170 Estrogen receptor positive status [ER+]: Secondary | ICD-10-CM | POA: Diagnosis not present

## 2017-09-19 DIAGNOSIS — C50911 Malignant neoplasm of unspecified site of right female breast: Secondary | ICD-10-CM | POA: Diagnosis not present

## 2017-09-19 DIAGNOSIS — Z510 Encounter for antineoplastic radiation therapy: Secondary | ICD-10-CM | POA: Diagnosis not present

## 2017-09-19 DIAGNOSIS — Z5111 Encounter for antineoplastic chemotherapy: Secondary | ICD-10-CM | POA: Diagnosis not present

## 2017-09-19 DIAGNOSIS — Z79811 Long term (current) use of aromatase inhibitors: Secondary | ICD-10-CM | POA: Diagnosis not present

## 2017-09-19 DIAGNOSIS — Z882 Allergy status to sulfonamides status: Secondary | ICD-10-CM | POA: Diagnosis not present

## 2017-09-19 MED ORDER — GOSERELIN ACETATE 3.6 MG ~~LOC~~ IMPL
3.6000 mg | DRUG_IMPLANT | Freq: Once | SUBCUTANEOUS | Status: AC
  Administered 2017-09-19: 3.6 mg via SUBCUTANEOUS
  Filled 2017-09-19: qty 3.6

## 2017-09-19 NOTE — Patient Instructions (Signed)
Goserelin injection What is this medicine? GOSERELIN (GOE se rel in) is similar to a hormone found in the body. It lowers the amount of sex hormones that the body makes. Men will have lower testosterone levels and women will have lower estrogen levels while taking this medicine. In men, this medicine is used to treat prostate cancer; the injection is either given once per month or once every 12 weeks. A once per month injection (only) is used to treat women with endometriosis, dysfunctional uterine bleeding, or advanced breast cancer. This medicine may be used for other purposes; ask your health care provider or pharmacist if you have questions. COMMON BRAND NAME(S): Zoladex What should I tell my health care provider before I take this medicine? They need to know if you have any of these conditions (some only apply to women): -diabetes -heart disease or previous heart attack -high blood pressure -high cholesterol -kidney disease -osteoporosis or low bone density -problems passing urine -spinal cord injury -stroke -tobacco smoker -an unusual or allergic reaction to goserelin, hormone therapy, other medicines, foods, dyes, or preservatives -pregnant or trying to get pregnant -breast-feeding How should I use this medicine? This medicine is for injection under the skin. It is given by a health care professional in a hospital or clinic setting. Men receive this injection once every 4 weeks or once every 12 weeks. Women will only receive the once every 4 weeks injection. Talk to your pediatrician regarding the use of this medicine in children. Special care may be needed. Overdosage: If you think you have taken too much of this medicine contact a poison control center or emergency room at once. NOTE: This medicine is only for you. Do not share this medicine with others. What if I miss a dose? It is important not to miss your dose. Call your doctor or health care professional if you are unable to  keep an appointment. What may interact with this medicine? -female hormones like estrogen -herbal or dietary supplements like black cohosh, chasteberry, or DHEA -female hormones like testosterone -prasterone This list may not describe all possible interactions. Give your health care provider a list of all the medicines, herbs, non-prescription drugs, or dietary supplements you use. Also tell them if you smoke, drink alcohol, or use illegal drugs. Some items may interact with your medicine. What should I watch for while using this medicine? Visit your doctor or health care professional for regular checks on your progress. Your symptoms may appear to get worse during the first weeks of this therapy. Tell your doctor or healthcare professional if your symptoms do not start to get better or if they get worse after this time. Your bones may get weaker if you take this medicine for a long time. If you smoke or frequently drink alcohol you may increase your risk of bone loss. A family history of osteoporosis, chronic use of drugs for seizures (convulsions), or corticosteroids can also increase your risk of bone loss. Talk to your doctor about how to keep your bones strong. This medicine should stop regular monthly menstration in women. Tell your doctor if you continue to menstrate. Women should not become pregnant while taking this medicine or for 12 weeks after stopping this medicine. Women should inform their doctor if they wish to become pregnant or think they might be pregnant. There is a potential for serious side effects to an unborn child. Talk to your health care professional or pharmacist for more information. Do not breast-feed an infant while taking   this medicine. Men should inform their doctors if they wish to father a child. This medicine may lower sperm counts. Talk to your health care professional or pharmacist for more information. What side effects may I notice from receiving this  medicine? Side effects that you should report to your doctor or health care professional as soon as possible: -allergic reactions like skin rash, itching or hives, swelling of the face, lips, or tongue -bone pain -breathing problems -changes in vision -chest pain -feeling faint or lightheaded, falls -fever, chills -pain, swelling, warmth in the leg -pain, tingling, numbness in the hands or feet -signs and symptoms of low blood pressure like dizziness; feeling faint or lightheaded, falls; unusually weak or tired -stomach pain -swelling of the ankles, feet, hands -trouble passing urine or change in the amount of urine -unusually high or low blood pressure -unusually weak or tired Side effects that usually do not require medical attention (report to your doctor or health care professional if they continue or are bothersome): -change in sex drive or performance -changes in breast size in both males and females -changes in emotions or moods -headache -hot flashes -irritation at site where injected -loss of appetite -skin problems like acne, dry skin -vaginal dryness This list may not describe all possible side effects. Call your doctor for medical advice about side effects. You may report side effects to FDA at 1-800-FDA-1088. Where should I keep my medicine? This drug is given in a hospital or clinic and will not be stored at home. NOTE: This sheet is a summary. It may not cover all possible information. If you have questions about this medicine, talk to your doctor, pharmacist, or health care provider.  2018 Elsevier/Gold Standard (2013-12-18 11:10:35)  

## 2017-09-20 ENCOUNTER — Encounter: Payer: Self-pay | Admitting: Radiation Oncology

## 2017-09-20 ENCOUNTER — Ambulatory Visit
Admission: RE | Admit: 2017-09-20 | Discharge: 2017-09-20 | Disposition: A | Discharge status: Home / Self Care | Payer: 59 | Source: Ambulatory Visit | Attending: Radiation Oncology | Admitting: Radiation Oncology

## 2017-09-20 DIAGNOSIS — Z880 Allergy status to penicillin: Secondary | ICD-10-CM | POA: Diagnosis not present

## 2017-09-20 DIAGNOSIS — Z882 Allergy status to sulfonamides status: Secondary | ICD-10-CM | POA: Diagnosis not present

## 2017-09-20 DIAGNOSIS — Z79899 Other long term (current) drug therapy: Secondary | ICD-10-CM | POA: Diagnosis not present

## 2017-09-20 DIAGNOSIS — Z170 Estrogen receptor positive status [ER+]: Secondary | ICD-10-CM | POA: Diagnosis not present

## 2017-09-20 DIAGNOSIS — C50911 Malignant neoplasm of unspecified site of right female breast: Secondary | ICD-10-CM | POA: Diagnosis not present

## 2017-09-20 DIAGNOSIS — Z888 Allergy status to other drugs, medicaments and biological substances status: Secondary | ICD-10-CM | POA: Diagnosis not present

## 2017-09-20 DIAGNOSIS — Z79811 Long term (current) use of aromatase inhibitors: Secondary | ICD-10-CM | POA: Diagnosis not present

## 2017-09-20 DIAGNOSIS — Z91040 Latex allergy status: Secondary | ICD-10-CM | POA: Diagnosis not present

## 2017-09-20 DIAGNOSIS — C50411 Malignant neoplasm of upper-outer quadrant of right female breast: Secondary | ICD-10-CM | POA: Diagnosis not present

## 2017-09-20 DIAGNOSIS — Z510 Encounter for antineoplastic radiation therapy: Secondary | ICD-10-CM | POA: Diagnosis not present

## 2017-09-20 NOTE — Progress Notes (Signed)
The patient was seen in the treatment area on 09/19/17 with concerns about distribution of erythema from radiotherapy treatment and some blanching of the skin laterally on the right breast. She has some mild dry desquamation in the right axilla without evidence of infection, and her tissue expander laterally is filled without increased induration or fluctuance. There is a small circumscribed area of her skin more laterally in the lower outer quadrant that is more blanched then the remainder of her breast. I feel like we can follow this clinically but do not see evidence of infection or necrosis. She will alert [place] of any additional changes, and will be followed weekly for under treat checks as well with Dr. Lisbeth Renshaw.

## 2017-09-21 ENCOUNTER — Ambulatory Visit
Admission: RE | Admit: 2017-09-21 | Discharge: 2017-09-21 | Disposition: A | Discharge status: Home / Self Care | Payer: 59 | Source: Ambulatory Visit | Attending: Radiation Oncology | Admitting: Radiation Oncology

## 2017-09-21 DIAGNOSIS — C50911 Malignant neoplasm of unspecified site of right female breast: Secondary | ICD-10-CM | POA: Diagnosis not present

## 2017-09-21 DIAGNOSIS — Z882 Allergy status to sulfonamides status: Secondary | ICD-10-CM | POA: Diagnosis not present

## 2017-09-21 DIAGNOSIS — Z79899 Other long term (current) drug therapy: Secondary | ICD-10-CM | POA: Diagnosis not present

## 2017-09-21 DIAGNOSIS — Z170 Estrogen receptor positive status [ER+]: Secondary | ICD-10-CM | POA: Diagnosis not present

## 2017-09-21 DIAGNOSIS — Z888 Allergy status to other drugs, medicaments and biological substances status: Secondary | ICD-10-CM | POA: Diagnosis not present

## 2017-09-21 DIAGNOSIS — Z79811 Long term (current) use of aromatase inhibitors: Secondary | ICD-10-CM | POA: Diagnosis not present

## 2017-09-21 DIAGNOSIS — Z510 Encounter for antineoplastic radiation therapy: Secondary | ICD-10-CM | POA: Diagnosis not present

## 2017-09-21 DIAGNOSIS — Z91040 Latex allergy status: Secondary | ICD-10-CM | POA: Diagnosis not present

## 2017-09-21 DIAGNOSIS — C50411 Malignant neoplasm of upper-outer quadrant of right female breast: Secondary | ICD-10-CM | POA: Diagnosis not present

## 2017-09-21 DIAGNOSIS — Z880 Allergy status to penicillin: Secondary | ICD-10-CM | POA: Diagnosis not present

## 2017-09-22 ENCOUNTER — Ambulatory Visit
Admission: RE | Admit: 2017-09-22 | Discharge: 2017-09-22 | Disposition: A | Discharge status: Home / Self Care | Payer: 59 | Source: Ambulatory Visit | Attending: Radiation Oncology | Admitting: Radiation Oncology

## 2017-09-22 DIAGNOSIS — Z79811 Long term (current) use of aromatase inhibitors: Secondary | ICD-10-CM | POA: Diagnosis not present

## 2017-09-22 DIAGNOSIS — Z170 Estrogen receptor positive status [ER+]: Secondary | ICD-10-CM | POA: Diagnosis not present

## 2017-09-22 DIAGNOSIS — Z880 Allergy status to penicillin: Secondary | ICD-10-CM | POA: Diagnosis not present

## 2017-09-22 DIAGNOSIS — Z888 Allergy status to other drugs, medicaments and biological substances status: Secondary | ICD-10-CM | POA: Diagnosis not present

## 2017-09-22 DIAGNOSIS — C50911 Malignant neoplasm of unspecified site of right female breast: Secondary | ICD-10-CM | POA: Diagnosis not present

## 2017-09-22 DIAGNOSIS — Z882 Allergy status to sulfonamides status: Secondary | ICD-10-CM | POA: Diagnosis not present

## 2017-09-22 DIAGNOSIS — C50411 Malignant neoplasm of upper-outer quadrant of right female breast: Secondary | ICD-10-CM | POA: Diagnosis not present

## 2017-09-22 DIAGNOSIS — Z91040 Latex allergy status: Secondary | ICD-10-CM | POA: Diagnosis not present

## 2017-09-22 DIAGNOSIS — Z79899 Other long term (current) drug therapy: Secondary | ICD-10-CM | POA: Diagnosis not present

## 2017-09-22 DIAGNOSIS — Z510 Encounter for antineoplastic radiation therapy: Secondary | ICD-10-CM | POA: Diagnosis not present

## 2017-09-23 ENCOUNTER — Ambulatory Visit
Admission: RE | Admit: 2017-09-23 | Discharge: 2017-09-23 | Disposition: A | Discharge status: Home / Self Care | Payer: 59 | Source: Ambulatory Visit | Attending: Radiation Oncology | Admitting: Radiation Oncology

## 2017-09-23 ENCOUNTER — Ambulatory Visit: Payer: 59 | Admitting: Radiation Oncology

## 2017-09-23 DIAGNOSIS — Z79811 Long term (current) use of aromatase inhibitors: Secondary | ICD-10-CM | POA: Diagnosis not present

## 2017-09-23 DIAGNOSIS — Z91040 Latex allergy status: Secondary | ICD-10-CM | POA: Diagnosis not present

## 2017-09-23 DIAGNOSIS — Z79899 Other long term (current) drug therapy: Secondary | ICD-10-CM | POA: Diagnosis not present

## 2017-09-23 DIAGNOSIS — Z510 Encounter for antineoplastic radiation therapy: Secondary | ICD-10-CM | POA: Diagnosis not present

## 2017-09-23 DIAGNOSIS — H52222 Regular astigmatism, left eye: Secondary | ICD-10-CM | POA: Diagnosis not present

## 2017-09-23 DIAGNOSIS — H5213 Myopia, bilateral: Secondary | ICD-10-CM | POA: Diagnosis not present

## 2017-09-23 DIAGNOSIS — Z170 Estrogen receptor positive status [ER+]: Secondary | ICD-10-CM | POA: Diagnosis not present

## 2017-09-23 DIAGNOSIS — Z880 Allergy status to penicillin: Secondary | ICD-10-CM | POA: Diagnosis not present

## 2017-09-23 DIAGNOSIS — C50911 Malignant neoplasm of unspecified site of right female breast: Secondary | ICD-10-CM | POA: Diagnosis not present

## 2017-09-23 DIAGNOSIS — C50411 Malignant neoplasm of upper-outer quadrant of right female breast: Secondary | ICD-10-CM | POA: Diagnosis not present

## 2017-09-23 DIAGNOSIS — Z882 Allergy status to sulfonamides status: Secondary | ICD-10-CM | POA: Diagnosis not present

## 2017-09-23 DIAGNOSIS — Z888 Allergy status to other drugs, medicaments and biological substances status: Secondary | ICD-10-CM | POA: Diagnosis not present

## 2017-09-26 ENCOUNTER — Ambulatory Visit
Admission: RE | Admit: 2017-09-26 | Discharge: 2017-09-26 | Disposition: A | Discharge status: Home / Self Care | Payer: 59 | Source: Ambulatory Visit | Attending: Radiation Oncology | Admitting: Radiation Oncology

## 2017-09-26 DIAGNOSIS — Z880 Allergy status to penicillin: Secondary | ICD-10-CM | POA: Diagnosis not present

## 2017-09-26 DIAGNOSIS — Z170 Estrogen receptor positive status [ER+]: Secondary | ICD-10-CM | POA: Diagnosis not present

## 2017-09-26 DIAGNOSIS — C50911 Malignant neoplasm of unspecified site of right female breast: Secondary | ICD-10-CM | POA: Diagnosis not present

## 2017-09-26 DIAGNOSIS — Z91040 Latex allergy status: Secondary | ICD-10-CM | POA: Diagnosis not present

## 2017-09-26 DIAGNOSIS — Z510 Encounter for antineoplastic radiation therapy: Secondary | ICD-10-CM | POA: Diagnosis not present

## 2017-09-26 DIAGNOSIS — Z882 Allergy status to sulfonamides status: Secondary | ICD-10-CM | POA: Diagnosis not present

## 2017-09-26 DIAGNOSIS — Z79899 Other long term (current) drug therapy: Secondary | ICD-10-CM | POA: Diagnosis not present

## 2017-09-26 DIAGNOSIS — Z79811 Long term (current) use of aromatase inhibitors: Secondary | ICD-10-CM | POA: Diagnosis not present

## 2017-09-26 DIAGNOSIS — C50411 Malignant neoplasm of upper-outer quadrant of right female breast: Secondary | ICD-10-CM | POA: Diagnosis not present

## 2017-09-26 DIAGNOSIS — Z888 Allergy status to other drugs, medicaments and biological substances status: Secondary | ICD-10-CM | POA: Diagnosis not present

## 2017-09-27 ENCOUNTER — Ambulatory Visit
Admission: RE | Admit: 2017-09-27 | Discharge: 2017-09-27 | Disposition: A | Discharge status: Home / Self Care | Payer: 59 | Source: Ambulatory Visit | Attending: Radiation Oncology | Admitting: Radiation Oncology

## 2017-09-27 ENCOUNTER — Ambulatory Visit: Payer: 59

## 2017-09-27 DIAGNOSIS — Z79811 Long term (current) use of aromatase inhibitors: Secondary | ICD-10-CM | POA: Diagnosis not present

## 2017-09-27 DIAGNOSIS — Z79899 Other long term (current) drug therapy: Secondary | ICD-10-CM | POA: Diagnosis not present

## 2017-09-27 DIAGNOSIS — C50911 Malignant neoplasm of unspecified site of right female breast: Secondary | ICD-10-CM | POA: Diagnosis not present

## 2017-09-27 DIAGNOSIS — Z91040 Latex allergy status: Secondary | ICD-10-CM | POA: Diagnosis not present

## 2017-09-27 DIAGNOSIS — Z888 Allergy status to other drugs, medicaments and biological substances status: Secondary | ICD-10-CM | POA: Diagnosis not present

## 2017-09-27 DIAGNOSIS — Z170 Estrogen receptor positive status [ER+]: Secondary | ICD-10-CM | POA: Diagnosis not present

## 2017-09-27 DIAGNOSIS — Z882 Allergy status to sulfonamides status: Secondary | ICD-10-CM | POA: Diagnosis not present

## 2017-09-27 DIAGNOSIS — Z880 Allergy status to penicillin: Secondary | ICD-10-CM | POA: Diagnosis not present

## 2017-09-27 DIAGNOSIS — Z510 Encounter for antineoplastic radiation therapy: Secondary | ICD-10-CM | POA: Diagnosis not present

## 2017-09-28 ENCOUNTER — Ambulatory Visit
Admission: RE | Admit: 2017-09-28 | Discharge: 2017-09-28 | Disposition: A | Discharge status: Home / Self Care | Payer: 59 | Source: Ambulatory Visit | Attending: Radiation Oncology | Admitting: Radiation Oncology

## 2017-09-28 ENCOUNTER — Ambulatory Visit: Payer: 59

## 2017-09-28 DIAGNOSIS — Z510 Encounter for antineoplastic radiation therapy: Secondary | ICD-10-CM | POA: Diagnosis not present

## 2017-09-28 DIAGNOSIS — C50911 Malignant neoplasm of unspecified site of right female breast: Secondary | ICD-10-CM | POA: Diagnosis not present

## 2017-09-28 DIAGNOSIS — Z79811 Long term (current) use of aromatase inhibitors: Secondary | ICD-10-CM | POA: Diagnosis not present

## 2017-09-28 DIAGNOSIS — Z888 Allergy status to other drugs, medicaments and biological substances status: Secondary | ICD-10-CM | POA: Diagnosis not present

## 2017-09-28 DIAGNOSIS — Z170 Estrogen receptor positive status [ER+]: Secondary | ICD-10-CM | POA: Diagnosis not present

## 2017-09-28 DIAGNOSIS — Z882 Allergy status to sulfonamides status: Secondary | ICD-10-CM | POA: Diagnosis not present

## 2017-09-28 DIAGNOSIS — Z79899 Other long term (current) drug therapy: Secondary | ICD-10-CM | POA: Diagnosis not present

## 2017-09-28 DIAGNOSIS — Z91040 Latex allergy status: Secondary | ICD-10-CM | POA: Diagnosis not present

## 2017-09-28 DIAGNOSIS — Z880 Allergy status to penicillin: Secondary | ICD-10-CM | POA: Diagnosis not present

## 2017-09-28 DIAGNOSIS — C50411 Malignant neoplasm of upper-outer quadrant of right female breast: Secondary | ICD-10-CM | POA: Diagnosis not present

## 2017-09-29 ENCOUNTER — Ambulatory Visit
Admission: RE | Admit: 2017-09-29 | Discharge: 2017-09-29 | Disposition: A | Discharge status: Home / Self Care | Payer: 59 | Source: Ambulatory Visit | Attending: Radiation Oncology | Admitting: Radiation Oncology

## 2017-09-29 DIAGNOSIS — Z510 Encounter for antineoplastic radiation therapy: Secondary | ICD-10-CM | POA: Diagnosis not present

## 2017-09-29 DIAGNOSIS — Z79899 Other long term (current) drug therapy: Secondary | ICD-10-CM | POA: Diagnosis not present

## 2017-09-29 DIAGNOSIS — Z888 Allergy status to other drugs, medicaments and biological substances status: Secondary | ICD-10-CM | POA: Diagnosis not present

## 2017-09-29 DIAGNOSIS — Z79811 Long term (current) use of aromatase inhibitors: Secondary | ICD-10-CM | POA: Diagnosis not present

## 2017-09-29 DIAGNOSIS — C50911 Malignant neoplasm of unspecified site of right female breast: Secondary | ICD-10-CM

## 2017-09-29 DIAGNOSIS — Z170 Estrogen receptor positive status [ER+]: Secondary | ICD-10-CM | POA: Diagnosis not present

## 2017-09-29 DIAGNOSIS — Z880 Allergy status to penicillin: Secondary | ICD-10-CM | POA: Diagnosis not present

## 2017-09-29 DIAGNOSIS — Z91040 Latex allergy status: Secondary | ICD-10-CM | POA: Diagnosis not present

## 2017-09-29 DIAGNOSIS — Z882 Allergy status to sulfonamides status: Secondary | ICD-10-CM | POA: Diagnosis not present

## 2017-09-29 MED ORDER — RADIAPLEXRX EX GEL
Freq: Once | CUTANEOUS | Status: AC
  Administered 2017-09-29: 15:00:00 via TOPICAL

## 2017-09-29 MED FILL — DOXYCYCLINE HYCLATE 100 MG: 100 | 30 days supply | Qty: 30 | Fill #1

## 2017-09-30 ENCOUNTER — Ambulatory Visit
Admission: RE | Admit: 2017-09-30 | Discharge: 2017-09-30 | Disposition: A | Discharge status: Home / Self Care | Payer: 59 | Source: Ambulatory Visit | Attending: Radiation Oncology | Admitting: Radiation Oncology

## 2017-09-30 DIAGNOSIS — Z510 Encounter for antineoplastic radiation therapy: Secondary | ICD-10-CM | POA: Diagnosis not present

## 2017-09-30 DIAGNOSIS — Z888 Allergy status to other drugs, medicaments and biological substances status: Secondary | ICD-10-CM | POA: Diagnosis not present

## 2017-09-30 DIAGNOSIS — Z79899 Other long term (current) drug therapy: Secondary | ICD-10-CM | POA: Diagnosis not present

## 2017-09-30 DIAGNOSIS — Z79811 Long term (current) use of aromatase inhibitors: Secondary | ICD-10-CM | POA: Diagnosis not present

## 2017-09-30 DIAGNOSIS — C50411 Malignant neoplasm of upper-outer quadrant of right female breast: Secondary | ICD-10-CM | POA: Diagnosis not present

## 2017-09-30 DIAGNOSIS — Z880 Allergy status to penicillin: Secondary | ICD-10-CM | POA: Diagnosis not present

## 2017-09-30 DIAGNOSIS — C50911 Malignant neoplasm of unspecified site of right female breast: Secondary | ICD-10-CM | POA: Diagnosis not present

## 2017-09-30 DIAGNOSIS — Z170 Estrogen receptor positive status [ER+]: Secondary | ICD-10-CM | POA: Diagnosis not present

## 2017-09-30 DIAGNOSIS — Z91040 Latex allergy status: Secondary | ICD-10-CM | POA: Diagnosis not present

## 2017-09-30 DIAGNOSIS — Z882 Allergy status to sulfonamides status: Secondary | ICD-10-CM | POA: Diagnosis not present

## 2017-10-03 ENCOUNTER — Ambulatory Visit: Payer: 59

## 2017-10-04 ENCOUNTER — Ambulatory Visit: Payer: 59

## 2017-10-04 NOTE — Progress Notes (Signed)
Katelyn Shelton  [phone] [phone]     ID: Katelyn Shelton DOB: [birth year]  MR#: [id number]  YDX#:[id number]  Patient Care Team: Katelyn Herb, MD as PCP - General (Family Medicine) Magrinat, Virgie Dad, MD as Consulting Physician (Oncology) Katelyn Bookbinder, MD as Consulting Physician (General Surgery) Katelyn Shorten, MD as Consulting Physician (Obstetrics and Gynecology) Katelyn Craver Gerrie Nordmann, MD as Referring Physician (Hematology and Oncology) Katelyn Rudd, MD as Consulting Physician (Radiation Oncology) Katelyn Limbo, MD as Consulting Physician (Plastic Surgery) Sorscher, Danice Goltz, MD as Referring Physician (Hematology and Oncology) Katelyn Shelton as Consulting Physician (Internal Medicine) OTHER MD:  CHIEF COMPLAINT: Estrogen receptor positive breast cancer  CURRENT TREATMENT: Adjuvant radiation, goserelin, to start anastrozole 10/25/2017   INTERVAL HISTORY: Katelyn Shelton returns today for follow-up and treatment of her estrogen receptor positive breast cancer, accompanied by Katelyn Shelton.  She is doing well. Finished radiation on friday. Her energy is good. not exercising as much this week because of snow. accompanied by her husband. too sore to exercise from finishing raditiaon. doesnt take anything for soreness.  REVIEW OF SYSTEMS: Katelyn Shelton finished radiation last week, which she tolerated well. She experienced some minor soreness to the radiation site and notes that because of that and the recent snow, she has not been exercising. She didn't take anything for her discomfort. Overall, she is doing great and notes that her energy levels have been normal. She has a prescription for doxycycline to help with her acne, that she hasn't been using, but reports she is going to start. She denies unusual headaches, visual changes, nausea, vomiting, or dizziness. There has been no unusual cough, phlegm production, or pleurisy. This been no change in bowel or bladder habits.  She denies unexplained fatigue or unexplained weight loss, bleeding, rash, or fever. A detailed review of systems was otherwise entirely stable.    BREAST CANCER HISTORY: From the original intake note:  "Katelyn Shelton" saw Dr. Corinna Shelton for routine follow-up and was found to have a palpable mass in the upper outer quadrant of her right breast and was set up for bilateral diagnostic mammography with tomography and right breast ultrasonography at the [facility] 02/08/2017. The breast density was category C. In the upper outer quadrant of the right breast there was an irregular mass. There was also a 0.3 cm group of slightly heterogeneous calcifications in the retroareolar left breast. On exam there was indeed a firm palpable mass at the 10:00 position of the right breast 4 cm from the nipple. On ultrasound this measured 3.5 cm, it was irregular and hypoechoic. There was no abnormal right axillary adenopathy.  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 The area of left breast calcifications was biopsied 02/09/2017 and showed only fibrocystic changes. ([id number] [id number]).  Her subsequent history is as detailed below.  PAST MEDICAL HISTORY: Past Medical History:  Diagnosis Date  . Anxiety   . Breast cancer ([facility]) 01/2017   right; genetic testing negative in 2018 with [facility] panel  . Depression   . History of MRSA infection    elbow    PAST SURGICAL HISTORY: Past Surgical History:  Procedure Laterality Date  . BREAST RECONSTRUCTION WITH PLACEMENT OF TISSUE EXPANDER AND FLEX HD (ACELLULAR HYDRATED DERMIS) Bilateral 02/28/2017   Procedure: BILATERAL BREAST RECONSTRUCTION WITH PLACEMENT OF TISSUE EXPANDER  AND ALLODERM;  Surgeon: Katelyn Limbo, MD;  Location: [place];   Service: Plastics;  Laterality: Bilateral;  . CESAREAN SECTION     x 2 (3 total)  . CESAREAN SECTION  02/04/2012   Procedure: CESAREAN SECTION;  Surgeon: Katelyn Mourning, MD;  Location: [facility];  Service: Gynecology;  Laterality: N/A;  Repeat Cesarean Section Delivery  Boy  @  [phone], Apgars 9/10  . DILATION AND CURETTAGE OF UTERUS    . EXCISION OF BREAST LESION Right 03/15/2017   Procedure: RIGHT NIPPLE AND AREOLA EXCISION;  Surgeon: Katelyn Bookbinder, MD;  Location: [facility];  Service: General;  Laterality: Right;  . NIPPLE SPARING MASTECTOMY Bilateral 02/28/2017   Procedure: RIGHT NIPPLE SPARING MASTECTOMY; LEFT PROPHYLACTIC NIPPLE SPARING MASTECTOMY;  Surgeon: Katelyn Bookbinder, MD;  Location: [facility];  Service: General;  Laterality: Bilateral;  . PECTUS EXCAVATUM REPAIR  1996  . SENTINEL NODE BIOPSY Right 02/28/2017   Procedure: RIGHT AXILLARY SENTINEL LYMPH  NODE BIOPSY;  Surgeon: Katelyn Bookbinder, MD;  Location: [facility];  Service: General;  Laterality: Right;  . VULVAR LESION REMOVAL    . WISDOM TOOTH EXTRACTION      FAMILY HISTORY Family History  Problem Relation Age of Onset  . Skin cancer Mother   . Parkinson's disease Sister 6  . Diabetes Maternal Aunt   . Heart failure Maternal Grandmother   . Heart attack Maternal Grandfather   . Stomach cancer Paternal Grandmother   . Prostate cancer Paternal Grandfather   . Alzheimer's disease Paternal Grandfather   . Breast cancer Cousin        mother's paternal first FEMALE cousin   The patient's father, Carolyne Shelton, is an Administrator, Civil Service, currently 40 years old. The patient's mother is 46 years old as of April 2018. Patient has no brothers, 2 sisters. There is a history breast cancer in the family in a great aunt on the patient's father's side. On the maternal side there is a female first cousin of the patient's mother with breast cancer. There is no history of ovarian cancer in the  family  GYNECOLOGIC HISTORY:  No LMP recorded. Katelyn Shelton had her first menstrual period age 44, her first live birth age 77. She has had 3 C-sections. Her periods are approximately every 21 days, with one heavy day. Her husband is status post vasectomy.  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 Her husband Katelyn Shelton is one of our cardiologists. The patient attends [employer] DIRECTIVES:    HEALTH MAINTENANCE: Social History   Tobacco Use  . Smoking status: Never Smoker  . Smokeless tobacco: Never Used  Substance Use Topics  . Alcohol use: No  . Drug use: No     Colonoscopy:  PAP:  Bone density:   Allergies  Allergen Reactions  . Peach Flavor Anaphylaxis  . Fentanyl Itching  . Penicillins Hives    AS A CHILD  . Sulfa Antibiotics Nausea And Vomiting  . Sulfasalazine Nausea And Vomiting and Other (See Comments)  . Latex Itching    Current Outpatient Medications  Medication Sig Dispense Refill  . acetaminophen (TYLENOL) 325 MG tablet Take 650 mg by mouth every 6 (six) hours as needed.    . doxycycline (DORYX) 100 MG EC tablet Take 100 mg by mouth daily.    . fluticasone (FLONASE) 50 MCG/ACT nasal spray Place 1 spray into both  nostrils daily as needed.    . hyaluronate sodium (RADIAPLEXRX) GEL Apply 1 application topically 2 (two) times daily.    [name] LORazepam (ATIVAN) 0.5 MG tablet TAKE 1/2 TO 1 TABLET BY MOUTH AT BEDTIME AS NEEDED FOR ANXIETY 30 tablet 3  . non-metallic deodorant (ALRA) MISC Apply 1 application topically daily as needed.    . rosuvastatin (CRESTOR) 5 MG tablet Take 1 tablet (5 mg total) by mouth daily. 90 tablet 3  . tamoxifen (NOLVADEX) 10 MG tablet Take 10 mg by mouth 2 (two) times daily.    [name] venlafaxine XR (EFFEXOR-XR) 75 MG 24 hr capsule Take 1 capsule (75 mg total) by mouth daily with breakfast. 90 capsule 3   No current facility-administered medications for this  visit.     OBJECTIVE: Young white woman who appears well  Vitals:   10/05/17 1044  BP: (!) 113/52  Pulse: 75  Resp: 18  Temp: 98.4 F (36.9 C)  SpO2: 99%     Body mass index is 24.65 kg/m.    ECOG FS:1 - Symptomatic but completely ambulatory   Sclerae unicteric, EOMs intact Oropharynx clear and moist No cervical or supraclavicular adenopathy Lungs no rales or rhonchi Heart regular rate and rhythm Abd soft, nontender, positive bowel sounds MSK no focal spinal tenderness, no upper extremity lymphedema Neuro: nonfocal, well oriented, appropriate affect Breasts: She is status post bilateral mastectomies with bilateral expanders in place.  The nipple on the right has not been reconstructed.  On the right side over the radiation port there is significant erythema and dry desquamation.  There is no evidence of local recurrence.  Both axillae are benign.  LAB RESULTS:  CMP     Component Value Date/Time   NA 140 08/19/2017 1337   K 3.6 08/19/2017 1337   CL 99 07/12/2014 0851   CO2 24 08/19/2017 1337   GLUCOSE 88 08/19/2017 1337   BUN 10.9 08/19/2017 1337   CREATININE 0.7 08/19/2017 1337   CALCIUM 9.0 08/19/2017 1337   PROT 6.6 08/19/2017 1337   ALBUMIN 3.9 08/19/2017 1337   AST 35 (H) 08/19/2017 1337   ALT 55 08/19/2017 1337   ALKPHOS 61 08/19/2017 1337   BILITOT 0.41 08/19/2017 1337   GFRNONAA 112 07/12/2014 0851   GFRAA 129 07/12/2014 0851    No results found for: TOTALPROTELP, ALBUMINELP, A1GS, A2GS, BETS, BETA2SER, GAMS, MSPIKE, SPEI  No results found for: Nils Pyle, [facility]  Lab Results  Component Value Date   WBC 6.8 08/19/2017   NEUTROABS 5.1 08/19/2017   HGB 12.2 08/19/2017   HCT 36.4 08/19/2017   MCV 97.7 08/19/2017   PLT 198 08/19/2017      Chemistry      Component Value Date/Time   NA 140 08/19/2017 1337   K 3.6 08/19/2017 1337   CL 99 07/12/2014 0851   CO2 24 08/19/2017 1337   BUN 10.9 08/19/2017 1337   CREATININE 0.7  08/19/2017 1337      Component Value Date/Time   CALCIUM 9.0 08/19/2017 1337   ALKPHOS 61 08/19/2017 1337   AST 35 (H) 08/19/2017 1337   ALT 55 08/19/2017 1337   BILITOT 0.41 08/19/2017 1337       No results found for: LABCA2  No components found for: [id number]  No results for input(s): INR in the last 168 hours.  Urinalysis    Component Value Date/Time   COLORURINE YELLOW 08/26/2011 [place] 08/26/2011 1652   LABSPEC 1.025 12/21/2013 1226  PHURINE 6.0 12/21/2013 1226   GLUCOSEU NEGATIVE 12/21/2013 1226   HGBUR TRACE (A) 12/21/2013 1226   BILIRUBINUR neg 07/12/2014 0913   KETONESUR NEGATIVE 12/21/2013 1226   PROTEINUR 4+ 07/12/2014 0913   PROTEINUR NEGATIVE 12/21/2013 1226   UROBILINOGEN negative 07/12/2014 0913   UROBILINOGEN 0.2 12/21/2013 1226   NITRITE neg 07/12/2014 0913   NITRITE NEGATIVE 12/21/2013 1226   LEUKOCYTESUR small (1+) 07/12/2014 0913     STUDIES: CT Thoracic and Lumbar Spine W Contrast, 09/01/2017, both resulted negative.   ELIGIBLE FOR AVAILABLE RESEARCH PROTOCOL: no  ASSESSMENT: 41 y.o. [place] woman status post right breast upper outer quadrant biopsy 02/08/2017 for a clinical T2 N0, stage IB invasive ductal carcinoma, grade 2, estrogen and progesterone receptor positive, HER-2 nonamplified, with an MIB-1 of 5%.  (a) biopsy of upper outer quadrant calcifications in the left breast 02/09/2017 were benign  (1) Oncotype DX obtained from the biopsy sample showed a recurrence score of 11 predicting a 10 year risk of recurrence outside the breast of 9% if the patient's only systemic therapy is tamoxifen for 5 years (node positive report)  (2) bilateral mastectomies with right sentinel lymph node sampling 02/28/2017 showed  (a) on the left, intraductal papilloma, with no evidence of malignancy.  (b) on the right, a pT2 pN1 stage IIA invasive ductal carcinoma, grade 1, with close margins  (c) additional excision of the right nipple  areolar area 03/15/2017 found no residual carcinoma  (3) Mammaprint study on the final surgical sample came back low risk, predicting a five-year distant disease-free survival of 97.8% with hormone therapy alone   (3) tamoxifen started neoadjuvantly 02/11/2017, Stopped 04/11/2017 in preparation for chemotherapy  (4) genetics counseling 02/19/2017 showed no deleterious mutations in the STAT gene panel offered by [facility] including ATM, BRCA1, BRCA2, CDH1, CHEK2, PALB2, PTEN, STK11, and TP53.  (a) multi gene panel February 21, 2017 through the  Multi-Gene Panel offered by [facility] found no deleterious mutations in ALK, APC, ATM, AXIN2,BAP1,  BARD1, BLM, BMPR1A, BRCA1, BRCA2, BRIP1, CASR, CDC73, CDH1, CDK4, CDKN1B, CDKN1C, CDKN2A (p14ARF), CDKN2A (p16INK4a), CEBPA, CHEK2, CTNNA1, DICER1, DIS3L2, EGFR (c.2369C>T, p.Thr790Met variant only), EPCAM (Deletion/duplication testing only), FH, FLCN, GATA2, GPC3, GREM1 (Promoter region deletion/duplication testing only), HOXB13 (c.251G>A, p.Gly84Glu), HRAS, KIT, MAX, MEN1, MET, MITF (c.952G>A, p.Glu318Lys variant only), MLH1, MSH2, MSH3, MSH6, MUTYH, NBN, NF1, NF2, NTHL1, PALB2, PDGFRA, PHOX2B, PMS2, POLD1, POLE, POT1, PRKAR1A, PTCH1, PTEN, RAD50, RAD51C, RAD51D, RB1, RECQL4, RET, RUNX1, SDHAF2, SDHA (sequence changes only), SDHB, SDHC, SDHD, SMAD4, SMARCA4, SMARCB1, SMARCE1, STK11, SUFU, TERT, TERT, TMEM127, TP53, TSC1, TSC2, VHL, WRN and WT1.   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  (9) significant hypercholesterolemia   PLAN: Katelyn Shelton tolerated her radiation remarkably well.  She is understandably still somewhat tired and of course she still has erythema  and desquamation.  I suspect the skin will improve greatly over the next week or 10 days.  The fatigue will last another 2 or 3 months if she follows the common pattern.  She absolutely hates the goserelin injections.  The actual menopausal symptoms are easier for her to bear.  She had considered proceeding to bilateral salpingo-oophorectomy in January but has decided against that as she is not entirely sure how comfortable she  is with early menopause.  Accordingly I have alerted Dr. Denman George and her team to cancel the January appointments.  I do not recommend starting anastrozole until she is feeling better.  Certainly January 1 would be the earliest that I would try.  I reassured her that if she has significant problems from anastrozole we could switch to tamoxifen, while continuing the goserelin.  I suspect though that she will tolerated well as she has been very stoical and motivated regarding all her treatments.  She has significant problems with dyspareunia.  We are referring her to our physical therapy "pelvic health" program.  She met with our nutritionist but is not satisfied with the advice she received regarding weight loss and has scheduled herself with a dietitian.  Today we again reviewed the importance of avoiding carbohydrates if she wishes to not gain more weight and possibly lose weight.  Of course she will resume her exercise program as soon as she recovers a little bit more from her radiation.  Finally she is interested in considering.  I think this would be a good move on her part.  She will discuss with her father Dr. Laurance Flatten on obtaining a DEXA scan.  She recently met with her dentist and there is no plan for any extractions.  Accordingly I have added a Prolia treatment at her next visit with me which will be in March.  She knows to call for any other issues that may develop before the next visit here.     Magrinat, Virgie Dad, MD  10/05/17 11:16 AM Medical Oncology and  Hematology [facility] [address] [place], [place] [address] Tel. [phone]    Fax. [phone]  This document serves as a record of services personally performed by Chauncey Cruel, MD. It was created on his behalf by Margit Banda, a trained medical scribe. The creation of this record is based on the scribe's personal observations and the provider's statements to them.   I have reviewed the above documentation for accuracy and completeness, and I agree with the above.

## 2017-10-05 ENCOUNTER — Ambulatory Visit (HOSPITAL_BASED_OUTPATIENT_CLINIC_OR_DEPARTMENT_OTHER): Payer: 59 | Admitting: Oncology

## 2017-10-05 VITALS — BP 113/52 | HR 75 | Temp 98.4°F | Resp 18 | Ht 60.0 in | Wt 126.2 lb

## 2017-10-05 DIAGNOSIS — C50411 Malignant neoplasm of upper-outer quadrant of right female breast: Secondary | ICD-10-CM | POA: Diagnosis not present

## 2017-10-05 DIAGNOSIS — Z170 Estrogen receptor positive status [ER+]: Secondary | ICD-10-CM | POA: Diagnosis not present

## 2017-10-05 DIAGNOSIS — R634 Abnormal weight loss: Secondary | ICD-10-CM

## 2017-10-05 DIAGNOSIS — C50911 Malignant neoplasm of unspecified site of right female breast: Secondary | ICD-10-CM

## 2017-10-05 MED ORDER — ANASTROZOLE 1 MG PO TABS: 1.0000 mg | ORAL_TABLET | Freq: Every day | ORAL | 4 refills | Status: DC

## 2017-10-05 MED FILL — ANASTROZOLE 1 MG TABLET: 1 | 90 days supply | Qty: 90 | Fill #0

## 2017-10-06 ENCOUNTER — Encounter: Payer: Self-pay | Admitting: Gynecologic Oncology

## 2017-10-06 ENCOUNTER — Encounter: Payer: Self-pay | Admitting: Radiation Oncology

## 2017-10-06 NOTE — Progress Notes (Signed)
Upcoming surgery with Dr. Denman George on Jan 8 along with follow up appt cancelled per Dr. Jana Hakim due to patient wanting to wait on having surgery.  Per Dr. Jana Hakim, he will reach out to our office when she is ready to reschedule.

## 2017-10-06 NOTE — Progress Notes (Signed)
  Radiation Oncology         [phone]) [phone] ________________________________  Name: Katelyn Shelton MRN: [id number]  Date: 10/06/2017  DOB: 1976-07-17  End of Treatment Note  Diagnosis:   Stage IIA, pT2N1Mx, grade 2, ER/PR positive invasive ductal carcinoma of the right breast     Indication for treatment:  Curative       Radiation treatment dates:   08/17/17-09/30/17  Site/dose:   1) Right chest wall and right SCLV/ 50.4 Gy in 28 fractions   2) Right chest wall boost/ 10 Gy in 5 fractions  Beams/energy:   1) 3D/ 10X, 6X    2) 3D / 6E  Narrative: The patient tolerated radiation treatment relatively well.   Patient had brisk erythema and dry desquamation to the treatment area. She complained of mild fatigue during treatment.  Plan: The patient has completed radiation treatment. The patient will return to radiation oncology clinic for routine followup in one month. I advised them to call or return sooner if they have any questions or concerns related to their recovery or treatment.  ------------------------------------------------  Jodelle Gross, MD, PhD  This document serves as a record of services personally performed by Kyung Rudd, MD. It was created on his behalf by Bethann Humble, a trained medical scribe. The creation of this record is based on the scribe's personal observations and the provider's statements to them. This document has been checked and approved by the attending provider.

## 2017-10-08 ENCOUNTER — Telehealth: Payer: Self-pay | Admitting: Oncology

## 2017-10-08 NOTE — Telephone Encounter (Signed)
Scheduled appt per 12/12 sch message - patient is aware of appts added and will pick up an updated schedule next visit.

## 2017-10-14 ENCOUNTER — Ambulatory Visit: Payer: Self-pay

## 2017-10-14 ENCOUNTER — Ambulatory Visit (HOSPITAL_BASED_OUTPATIENT_CLINIC_OR_DEPARTMENT_OTHER): Payer: 59

## 2017-10-14 VITALS — BP 118/71 | HR 89 | Temp 98.6°F | Resp 20

## 2017-10-14 DIAGNOSIS — C50411 Malignant neoplasm of upper-outer quadrant of right female breast: Secondary | ICD-10-CM

## 2017-10-14 DIAGNOSIS — Z5111 Encounter for antineoplastic chemotherapy: Secondary | ICD-10-CM

## 2017-10-14 DIAGNOSIS — Z170 Estrogen receptor positive status [ER+]: Principal | ICD-10-CM

## 2017-10-14 MED ORDER — GOSERELIN ACETATE 3.6 MG ~~LOC~~ IMPL
3.6000 mg | DRUG_IMPLANT | Freq: Once | SUBCUTANEOUS | Status: AC
  Administered 2017-10-14: 3.6 mg via SUBCUTANEOUS
  Filled 2017-10-14: qty 3.6

## 2017-10-14 NOTE — Patient Instructions (Signed)
Goserelin injection What is this medicine? GOSERELIN (GOE se rel in) is similar to a hormone found in the body. It lowers the amount of sex hormones that the body makes. Men will have lower testosterone levels and women will have lower estrogen levels while taking this medicine. In men, this medicine is used to treat prostate cancer; the injection is either given once per month or once every 12 weeks. A once per month injection (only) is used to treat women with endometriosis, dysfunctional uterine bleeding, or advanced breast cancer. This medicine may be used for other purposes; ask your health care provider or pharmacist if you have questions. COMMON BRAND NAME(S): Zoladex What should I tell my health care provider before I take this medicine? They need to know if you have any of these conditions (some only apply to women): -diabetes -heart disease or previous heart attack -high blood pressure -high cholesterol -kidney disease -osteoporosis or low bone density -problems passing urine -spinal cord injury -stroke -tobacco smoker -an unusual or allergic reaction to goserelin, hormone therapy, other medicines, foods, dyes, or preservatives -pregnant or trying to get pregnant -breast-feeding How should I use this medicine? This medicine is for injection under the skin. It is given by a health care professional in a hospital or clinic setting. Men receive this injection once every 4 weeks or once every 12 weeks. Women will only receive the once every 4 weeks injection. Talk to your pediatrician regarding the use of this medicine in children. Special care may be needed. Overdosage: If you think you have taken too much of this medicine contact a poison control center or emergency room at once. NOTE: This medicine is only for you. Do not share this medicine with others. What if I miss a dose? It is important not to miss your dose. Call your doctor or health care professional if you are unable to  keep an appointment. What may interact with this medicine? -female hormones like estrogen -herbal or dietary supplements like black cohosh, chasteberry, or DHEA -female hormones like testosterone -prasterone This list may not describe all possible interactions. Give your health care provider a list of all the medicines, herbs, non-prescription drugs, or dietary supplements you use. Also tell them if you smoke, drink alcohol, or use illegal drugs. Some items may interact with your medicine. What should I watch for while using this medicine? Visit your doctor or health care professional for regular checks on your progress. Your symptoms may appear to get worse during the first weeks of this therapy. Tell your doctor or healthcare professional if your symptoms do not start to get better or if they get worse after this time. Your bones may get weaker if you take this medicine for a long time. If you smoke or frequently drink alcohol you may increase your risk of bone loss. A family history of osteoporosis, chronic use of drugs for seizures (convulsions), or corticosteroids can also increase your risk of bone loss. Talk to your doctor about how to keep your bones strong. This medicine should stop regular monthly menstration in women. Tell your doctor if you continue to menstrate. Women should not become pregnant while taking this medicine or for 12 weeks after stopping this medicine. Women should inform their doctor if they wish to become pregnant or think they might be pregnant. There is a potential for serious side effects to an unborn child. Talk to your health care professional or pharmacist for more information. Do not breast-feed an infant while taking   this medicine. Men should inform their doctors if they wish to father a child. This medicine may lower sperm counts. Talk to your health care professional or pharmacist for more information. What side effects may I notice from receiving this  medicine? Side effects that you should report to your doctor or health care professional as soon as possible: -allergic reactions like skin rash, itching or hives, swelling of the face, lips, or tongue -bone pain -breathing problems -changes in vision -chest pain -feeling faint or lightheaded, falls -fever, chills -pain, swelling, warmth in the leg -pain, tingling, numbness in the hands or feet -signs and symptoms of low blood pressure like dizziness; feeling faint or lightheaded, falls; unusually weak or tired -stomach pain -swelling of the ankles, feet, hands -trouble passing urine or change in the amount of urine -unusually high or low blood pressure -unusually weak or tired Side effects that usually do not require medical attention (report to your doctor or health care professional if they continue or are bothersome): -change in sex drive or performance -changes in breast size in both males and females -changes in emotions or moods -headache -hot flashes -irritation at site where injected -loss of appetite -skin problems like acne, dry skin -vaginal dryness This list may not describe all possible side effects. Call your doctor for medical advice about side effects. You may report side effects to FDA at 1-800-FDA-1088. Where should I keep my medicine? This drug is given in a hospital or clinic and will not be stored at home. NOTE: This sheet is a summary. It may not cover all possible information. If you have questions about this medicine, talk to your doctor, pharmacist, or health care provider.  2018 Elsevier/Gold Standard (2013-12-18 11:10:35)  

## 2017-10-15 ENCOUNTER — Other Ambulatory Visit: Payer: Self-pay | Admitting: Oncology

## 2017-10-25 HISTORY — PX: COLONOSCOPY: SHX174

## 2017-10-26 ENCOUNTER — Other Ambulatory Visit: Payer: Self-pay | Admitting: Oncology

## 2017-10-28 ENCOUNTER — Other Ambulatory Visit: Payer: Self-pay | Admitting: *Deleted

## 2017-10-28 DIAGNOSIS — Z170 Estrogen receptor positive status [ER+]: Principal | ICD-10-CM

## 2017-10-28 DIAGNOSIS — C50911 Malignant neoplasm of unspecified site of right female breast: Secondary | ICD-10-CM

## 2017-10-28 DIAGNOSIS — C50411 Malignant neoplasm of upper-outer quadrant of right female breast: Secondary | ICD-10-CM

## 2017-10-31 ENCOUNTER — Telehealth: Payer: Self-pay | Admitting: *Deleted

## 2017-10-31 ENCOUNTER — Other Ambulatory Visit: Payer: Self-pay | Admitting: *Deleted

## 2017-10-31 NOTE — Telephone Encounter (Signed)
Called and cancelled the patient's post op appt.

## 2017-11-01 ENCOUNTER — Ambulatory Visit (HOSPITAL_COMMUNITY): Admit: 2017-11-01 | Payer: 59 | Admitting: Gynecologic Oncology

## 2017-11-01 ENCOUNTER — Encounter (HOSPITAL_COMMUNITY): Payer: Self-pay

## 2017-11-01 SURGERY — SALPINGO-OOPHORECTOMY, BILATERAL, ROBOT-ASSISTED
Anesthesia: General | Laterality: Bilateral

## 2017-11-02 ENCOUNTER — Ambulatory Visit: Payer: Self-pay | Admitting: Gynecologic Oncology

## 2017-11-07 ENCOUNTER — Encounter: Payer: Self-pay | Admitting: Oncology

## 2017-11-07 NOTE — Progress Notes (Signed)
Referral sent to Dr Hillery Hunter per Navigator, faxed to [phone], confirmation received 11/07/17

## 2017-11-10 ENCOUNTER — Ambulatory Visit: Payer: 59 | Admitting: Physical Therapy

## 2017-11-11 ENCOUNTER — Inpatient Hospital Stay: Payer: 59 | Attending: Oncology

## 2017-11-11 DIAGNOSIS — C50411 Malignant neoplasm of upper-outer quadrant of right female breast: Secondary | ICD-10-CM | POA: Diagnosis not present

## 2017-11-11 DIAGNOSIS — Z79818 Long term (current) use of other agents affecting estrogen receptors and estrogen levels: Secondary | ICD-10-CM | POA: Diagnosis not present

## 2017-11-11 DIAGNOSIS — Z170 Estrogen receptor positive status [ER+]: Secondary | ICD-10-CM | POA: Diagnosis not present

## 2017-11-11 MED ORDER — GOSERELIN ACETATE 3.6 MG ~~LOC~~ IMPL
3.6000 mg | DRUG_IMPLANT | Freq: Once | SUBCUTANEOUS | Status: AC
  Administered 2017-11-11: 3.6 mg via SUBCUTANEOUS
  Filled 2017-11-11: qty 3.6

## 2017-11-11 NOTE — Patient Instructions (Signed)
Goserelin injection What is this medicine? GOSERELIN (GOE se rel in) is similar to a hormone found in the body. It lowers the amount of sex hormones that the body makes. Men will have lower testosterone levels and women will have lower estrogen levels while taking this medicine. In men, this medicine is used to treat prostate cancer; the injection is either given once per month or once every 12 weeks. A once per month injection (only) is used to treat women with endometriosis, dysfunctional uterine bleeding, or advanced breast cancer. This medicine may be used for other purposes; ask your health care provider or pharmacist if you have questions. COMMON BRAND NAME(S): Zoladex What should I tell my health care provider before I take this medicine? They need to know if you have any of these conditions (some only apply to women): -diabetes -heart disease or previous heart attack -high blood pressure -high cholesterol -kidney disease -osteoporosis or low bone density -problems passing urine -spinal cord injury -stroke -tobacco smoker -an unusual or allergic reaction to goserelin, hormone therapy, other medicines, foods, dyes, or preservatives -pregnant or trying to get pregnant -breast-feeding How should I use this medicine? This medicine is for injection under the skin. It is given by a health care professional in a hospital or clinic setting. Men receive this injection once every 4 weeks or once every 12 weeks. Women will only receive the once every 4 weeks injection. Talk to your pediatrician regarding the use of this medicine in children. Special care may be needed. Overdosage: If you think you have taken too much of this medicine contact a poison control center or emergency room at once. NOTE: This medicine is only for you. Do not share this medicine with others. What if I miss a dose? It is important not to miss your dose. Call your doctor or health care professional if you are unable to  keep an appointment. What may interact with this medicine? -female hormones like estrogen -herbal or dietary supplements like black cohosh, chasteberry, or DHEA -female hormones like testosterone -prasterone This list may not describe all possible interactions. Give your health care provider a list of all the medicines, herbs, non-prescription drugs, or dietary supplements you use. Also tell them if you smoke, drink alcohol, or use illegal drugs. Some items may interact with your medicine. What should I watch for while using this medicine? Visit your doctor or health care professional for regular checks on your progress. Your symptoms may appear to get worse during the first weeks of this therapy. Tell your doctor or healthcare professional if your symptoms do not start to get better or if they get worse after this time. Your bones may get weaker if you take this medicine for a long time. If you smoke or frequently drink alcohol you may increase your risk of bone loss. A family history of osteoporosis, chronic use of drugs for seizures (convulsions), or corticosteroids can also increase your risk of bone loss. Talk to your doctor about how to keep your bones strong. This medicine should stop regular monthly menstration in women. Tell your doctor if you continue to menstrate. Women should not become pregnant while taking this medicine or for 12 weeks after stopping this medicine. Women should inform their doctor if they wish to become pregnant or think they might be pregnant. There is a potential for serious side effects to an unborn child. Talk to your health care professional or pharmacist for more information. Do not breast-feed an infant while taking   this medicine. Men should inform their doctors if they wish to father a child. This medicine may lower sperm counts. Talk to your health care professional or pharmacist for more information. What side effects may I notice from receiving this  medicine? Side effects that you should report to your doctor or health care professional as soon as possible: -allergic reactions like skin rash, itching or hives, swelling of the face, lips, or tongue -bone pain -breathing problems -changes in vision -chest pain -feeling faint or lightheaded, falls -fever, chills -pain, swelling, warmth in the leg -pain, tingling, numbness in the hands or feet -signs and symptoms of low blood pressure like dizziness; feeling faint or lightheaded, falls; unusually weak or tired -stomach pain -swelling of the ankles, feet, hands -trouble passing urine or change in the amount of urine -unusually high or low blood pressure -unusually weak or tired Side effects that usually do not require medical attention (report to your doctor or health care professional if they continue or are bothersome): -change in sex drive or performance -changes in breast size in both males and females -changes in emotions or moods -headache -hot flashes -irritation at site where injected -loss of appetite -skin problems like acne, dry skin -vaginal dryness This list may not describe all possible side effects. Call your doctor for medical advice about side effects. You may report side effects to FDA at 1-800-FDA-1088. Where should I keep my medicine? This drug is given in a hospital or clinic and will not be stored at home. NOTE: This sheet is a summary. It may not cover all possible information. If you have questions about this medicine, talk to your doctor, pharmacist, or health care provider.  2018 Elsevier/Gold Standard (2013-12-18 11:10:35)  

## 2017-11-15 ENCOUNTER — Other Ambulatory Visit: Payer: Self-pay

## 2017-11-15 ENCOUNTER — Ambulatory Visit
Admission: RE | Admit: 2017-11-15 | Discharge: 2017-11-15 | Disposition: A | Discharge status: Home / Self Care | Payer: 59 | Source: Ambulatory Visit | Attending: Radiation Oncology | Admitting: Radiation Oncology

## 2017-11-15 ENCOUNTER — Encounter: Payer: Self-pay | Admitting: Radiation Oncology

## 2017-11-15 VITALS — BP 95/45 | HR 74 | Temp 98.0°F | Resp 18 | Ht <= 58 in | Wt 125.8 lb

## 2017-11-15 DIAGNOSIS — Z91040 Latex allergy status: Secondary | ICD-10-CM | POA: Diagnosis not present

## 2017-11-15 DIAGNOSIS — Z79811 Long term (current) use of aromatase inhibitors: Secondary | ICD-10-CM | POA: Diagnosis not present

## 2017-11-15 DIAGNOSIS — Z882 Allergy status to sulfonamides status: Secondary | ICD-10-CM | POA: Diagnosis not present

## 2017-11-15 DIAGNOSIS — C50911 Malignant neoplasm of unspecified site of right female breast: Secondary | ICD-10-CM | POA: Diagnosis not present

## 2017-11-15 DIAGNOSIS — Z880 Allergy status to penicillin: Secondary | ICD-10-CM | POA: Diagnosis not present

## 2017-11-15 DIAGNOSIS — C50411 Malignant neoplasm of upper-outer quadrant of right female breast: Secondary | ICD-10-CM

## 2017-11-15 DIAGNOSIS — Z79899 Other long term (current) drug therapy: Secondary | ICD-10-CM | POA: Diagnosis not present

## 2017-11-15 DIAGNOSIS — Z888 Allergy status to other drugs, medicaments and biological substances status: Secondary | ICD-10-CM | POA: Diagnosis not present

## 2017-11-15 DIAGNOSIS — Z170 Estrogen receptor positive status [ER+]: Secondary | ICD-10-CM | POA: Diagnosis not present

## 2017-11-15 DIAGNOSIS — Z510 Encounter for antineoplastic radiation therapy: Secondary | ICD-10-CM | POA: Diagnosis not present

## 2017-11-15 NOTE — Progress Notes (Signed)
Radiation Oncology         [phone] ________________________________  Name: Katelyn Shelton MRN: [id number]  Date of Service: 11/15/2017  DOB: May 10, 1976  Post Treatment Note  CC: Chipper Herb, MD  Magrinat, Virgie Dad, MD  Diagnosis:   Stage IIA, pT2N1Mx, grade 2, ER/PR positive invasive ductal carcinoma of the right breast     Interval Since Last Radiation:  7 weeks   08/17/17-09/30/17: 1) Right chest wall and right SCLV/ 50.4 Gy in 28 fractions 2) Right chest wall boost/ 10 Gy in 5 fractions   Narrative:  The patient returns today for routine follow-up. During treatment she did very well with radiotherapy and did have a brisk skin response with mild desquamation.                             On review of systems, the patient states she is doing well. She denies any concerns with her skin, and states this has improved since completing treatment. No other complaints are noted.   ALLERGIES:  is allergic to peach flavor; fentanyl; penicillins; sulfa antibiotics; sulfasalazine; and latex.  Meds: Current Outpatient Medications  Medication Sig Dispense Refill  . acetaminophen (TYLENOL) 325 MG tablet Take 650 mg by mouth every 6 (six) hours as needed.    [name] anastrozole (ARIMIDEX) 1 MG tablet Take 1 tablet (1 mg total) by mouth daily. 90 tablet 4  . doxycycline (DORYX) 100 MG EC tablet Take 100 mg by mouth daily.    [name] LORazepam (ATIVAN) 0.5 MG tablet TAKE 1/2 TO 1 TABLET BY MOUTH AT BEDTIME AS NEEDED FOR ANXIETY 30 tablet 3  . rosuvastatin (CRESTOR) 5 MG tablet Take 1 tablet (5 mg total) by mouth daily. 90 tablet 3  . venlafaxine XR (EFFEXOR-XR) 75 MG 24 hr capsule Take 1 capsule (75 mg total) by mouth daily with breakfast. 90 capsule 3  . fluticasone (FLONASE) 50 MCG/ACT nasal spray Place 1 spray into both nostrils daily as needed.     No current facility-administered medications for this encounter.     Physical Findings:  height is 5" (0.127 m) (abnormal) and weight is 125 lb  12.8 oz (57.1 kg). Her oral temperature is 98 F (36.7 C). Her blood pressure is 95/45 (abnormal) and her pulse is 74. Her respiration is 18 and oxygen saturation is 100%.  Pain Assessment Pain Score: 0-No pain/10 In general this is a well appearing caucasian female in no acute distress. She's alert and oriented x4 and appropriate throughout the examination. Cardiopulmonary assessment is negative for acute distress and she exhibits normal effort. The right breast was examined and reveals an intact expander. Mild hyperpigmentation is noted laterally along her axilla. No desquamation is noted.  Lab Findings: Lab Results  Component Value Date   WBC 6.8 08/19/2017   HGB 12.2 08/19/2017   HCT 36.4 08/19/2017   MCV 97.7 08/19/2017   PLT 198 08/19/2017     Radiographic Findings: No results found.  Impression/Plan: 1. Stage IIA, pT2N1Mx, grade 2, ER/PR positive invasive ductal carcinoma of the right breast. The patient has been doing well since completion of radiotherapy. We discussed that we would be happy to continue to follow her as needed, but she will also continue to follow up with Dr. Jana Hakim in medical oncology. She was counseled on skin care as well as measures to avoid sun exposure to this area.      Carola Rhine, PAC Seen  with and dictated for Jodelle Gross, MD, PhD

## 2017-11-16 ENCOUNTER — Ambulatory Visit (INDEPENDENT_AMBULATORY_CARE_PROVIDER_SITE_OTHER): Payer: 59 | Admitting: Licensed Clinical Social Worker

## 2017-11-16 DIAGNOSIS — F324 Major depressive disorder, single episode, in partial remission: Secondary | ICD-10-CM | POA: Diagnosis not present

## 2017-11-17 MED FILL — VENLAFAXINE HCL ER 75 MG CA: 75 | 90 days supply | Qty: 90 | Fill #2

## 2017-11-21 ENCOUNTER — Encounter: Payer: Self-pay | Admitting: Skilled Nursing Facility1

## 2017-11-21 ENCOUNTER — Encounter: Payer: 59 | Attending: Family Medicine | Admitting: Skilled Nursing Facility1

## 2017-11-21 DIAGNOSIS — Z6825 Body mass index (BMI) 25.0-25.9, adult: Secondary | ICD-10-CM | POA: Insufficient documentation

## 2017-11-21 DIAGNOSIS — R635 Abnormal weight gain: Secondary | ICD-10-CM | POA: Insufficient documentation

## 2017-11-21 DIAGNOSIS — C50411 Malignant neoplasm of upper-outer quadrant of right female breast: Secondary | ICD-10-CM | POA: Diagnosis not present

## 2017-11-21 DIAGNOSIS — Z713 Dietary counseling and surveillance: Secondary | ICD-10-CM | POA: Insufficient documentation

## 2017-11-21 DIAGNOSIS — Z170 Estrogen receptor positive status [ER+]: Secondary | ICD-10-CM | POA: Insufficient documentation

## 2017-11-21 DIAGNOSIS — E639 Nutritional deficiency, unspecified: Secondary | ICD-10-CM

## 2017-11-21 NOTE — Patient Instructions (Addendum)
-  Try to work on resistance workouts   -Think about physical therapy   -[employer]  -2 calcium's in a day

## 2017-11-21 NOTE — Progress Notes (Signed)
  Assessment:  Primary concerns today: employee self referral.  Pt states she has a little bit of an allergic response to fruit and usually the worst with peaches. Pt states she had breast cancer and was on chemo all summer and is now going into menopause. Pt states she would like to be 120 pounds. Pt states she has 3 children. Pt states she always needs something sweet at 4pm. Pt states she cooks Serpate meals for her children because they are picky. Pt beleives she has cancer because she ate carbohydrates and did not breast feed: Dietitian attempted to educate the pt on this. Dietitian had trouble educating th ept due to her non linear progression of thought and talkative nature.  MEDICATIONS: See List   DIETARY INTAKE:  Usual eating pattern includes 3 meals and 2 snacks per day.  Everyday foods include fruit smoothie.  Avoided foods include peaches.    24-hr recall:  B ( AM): cottage cheese and fruit or half grapefruit or small bran muffin Snk ( AM):   L ( PM): salad with some meat or soup with bread and sweets  Snk ( 3PM): cake pop and coffee or rice krispy treat D ( 6PM): salad and salmon or cottage cheese  Snk (9-10 PM): kind bar  Beverages: coffee with half and half and truvia, diet coke, water 16oz, orange juice   Usual physical activity: 30-60 minutes walking 5 days a week: some running sometimes   Estimated energy needs: 1400 calories 158 g carbohydrates 105 g protein 39 g fat  Progress Towards Goal(s):  In progress.    Intervention:  Nutrition counseling for weight management and breast cancer. Dietitian attempted to educate the pt on plant based diet. Goals: -Try to work on resistance workouts  -Think about physical therapy  -Arts development officer -2 calcium's in a day  Teaching Method Utilized:  Visual Auditory Hands on  Handouts given during visit include:  Vegetarian handout  Demonstrated degree of understanding via:  Teach Back   Monitoring/Evaluation:   Dietary intake, exercise, and body weight prn.

## 2017-11-23 ENCOUNTER — Other Ambulatory Visit: Payer: 59

## 2017-11-25 ENCOUNTER — Ambulatory Visit (INDEPENDENT_AMBULATORY_CARE_PROVIDER_SITE_OTHER): Payer: 59

## 2017-11-25 ENCOUNTER — Other Ambulatory Visit: Payer: Self-pay | Admitting: Oncology

## 2017-11-25 ENCOUNTER — Encounter: Payer: Self-pay | Admitting: Oncology

## 2017-11-25 ENCOUNTER — Other Ambulatory Visit: Payer: Self-pay | Admitting: *Deleted

## 2017-11-25 DIAGNOSIS — M8588 Other specified disorders of bone density and structure, other site: Secondary | ICD-10-CM | POA: Diagnosis not present

## 2017-11-25 DIAGNOSIS — Z13820 Encounter for screening for osteoporosis: Secondary | ICD-10-CM

## 2017-11-25 DIAGNOSIS — C50411 Malignant neoplasm of upper-outer quadrant of right female breast: Secondary | ICD-10-CM

## 2017-11-25 DIAGNOSIS — C50919 Malignant neoplasm of unspecified site of unspecified female breast: Secondary | ICD-10-CM | POA: Diagnosis not present

## 2017-11-25 DIAGNOSIS — Z780 Asymptomatic menopausal state: Secondary | ICD-10-CM | POA: Diagnosis not present

## 2017-11-25 DIAGNOSIS — M25552 Pain in left hip: Secondary | ICD-10-CM | POA: Diagnosis not present

## 2017-11-25 DIAGNOSIS — Z853 Personal history of malignant neoplasm of breast: Secondary | ICD-10-CM | POA: Diagnosis not present

## 2017-11-25 DIAGNOSIS — Z170 Estrogen receptor positive status [ER+]: Principal | ICD-10-CM

## 2017-11-29 ENCOUNTER — Encounter: Payer: Self-pay | Admitting: Physical Therapy

## 2017-11-29 ENCOUNTER — Ambulatory Visit: Payer: 59 | Attending: Oncology | Admitting: Physical Therapy

## 2017-11-29 ENCOUNTER — Other Ambulatory Visit: Payer: Self-pay

## 2017-11-29 ENCOUNTER — Ambulatory Visit: Payer: 59 | Admitting: Family Medicine

## 2017-11-29 DIAGNOSIS — M62838 Other muscle spasm: Secondary | ICD-10-CM | POA: Insufficient documentation

## 2017-11-29 NOTE — Patient Instructions (Addendum)
Moisturizers . They are used in the vagina to hydrate the mucous membrane that make up the vaginal canal. . Designed to keep a more normal acid balance (ph) . Once placed in the vagina, it will last between two to three days.  . Use 2-3 times per week at bedtime and last longer than 60 min. . Ingredients to avoid is glycerin and fragrance, can increase chance of infection . Should not be used just before sex due to causing irritation . Most are gels administered either in a tampon-shaped applicator or as a vaginal suppository. They are non-hormonal.   Types of Moisturizers . Katelyn Shelton- drug store . Katelyn Shelton- Whole foods, Amazon . Katelyn Shelton . Coconut oil- can break down condoms . Katelyn Shelton . Katelyn Shelton- amazon . Katelyn Shelton , Katelyn Shelton for menopausal or over 65 (if have severe vaginal atrophy or cancer treatments use Katelyn Shelton for  1 month than move to [employer])- [employer], [email or web address] . Katelyn Shelton- [email or web address]  Creams to use externally on the Vulva area  Katelyn Shelton (good for for cancer patients that had radiation to the area)- Katelyn Shelton ([place]) or Katelyn Shelton  Katelyn Shelton - amazon  Katelyn Shelton-amazon  Katelyn Shelton "Katelyn Shelton ( help moisturize and help with thinning vulvar area, does have [place]   Things to avoid in the vaginal area . Do not use things to irritate the vulvar area . No lotions just specialized creams for the vulva area- Katelyn Shelton, Katelyn, No soaps; can use Aveeno or Calendula cleanser if needed. Must be gentle . No deodorants . No douches . Good to sleep without underwear to let the vaginal area to air out . No scrubbing: spread the lips to let warm water rinse over labias and pat dry   Lubrication . Used for intercourse to reduce friction . Avoid ones that have glycerin, warming gels, tingling gels, icing or  cooling gel, scented . Avoid parabens due to a preservative similar to female sex hormone . May need to be reapplied once or several times during sexual activity . Can be applied to both partners genitals prior to vaginal penetration to minimize friction or irritation . Prevent irritation and mucosal tears that cause post coital pain and increased the risk of vaginal and urinary tract infections . Oil-based lubricants cannot be used with condoms due to breaking them down.  Least likely to irritate vaginal tissue.  . Plant based-lubes are safe . Silicone-based lubrication are thicker and last long and used for post-menopausal women  Vaginal Katelyn Shelton Here is a list of some suggested Katelyn Shelton you can use for intercourse. Use the most hypoallergenic product.  You can place on you or your partner.   Katelyn Shelton  Katelyn Shelton or Katelyn Shelton ( good  if frequent UTI's)  Katelyn Shelton ([email or web address])  Luvena   Coconut oil  PJur Woman Nude- water based lubricant, amazon  Uberlube- Amazon  Aloe Vera  Katelyn lubricant- [employer] Platinum-Silicone, Target, [facility]  Katelyn Shelton-  [email or web address] Things to avoid in lubricants are glycerin, warming gels, tingling gels, icing or cooling  gels, and scented gels.  Also avoid Vaseline. KY jelly, Replens, and Astroglide kills good bacteria(lactobacilli)  Things to avoid in the vaginal area . Do not use things to irritate the vulvar area . No lotions- see below . No soaps; can use Aveeno or Calendula cleanser if needed. Must  be gentle . No deodorants . No douches . Good to sleep without underwear to let the vaginal area to air out . No scrubbing: spread the lips to let warm water rinse over labias and pat dry  Creams that can be used on the [place] [employer] or [facility] [address] [place], [place] [address] Phone # [phone] Fax [phone]

## 2017-11-29 NOTE — Therapy (Signed)
[facility] [address], [place], [place], [address] Phone: [phone]   Fax:  [phone]  Physical Therapy Evaluation  Patient Details  Name: Katelyn Shelton MRN: [id number] Date of Birth: [birth year] Referring Provider: Dr. Lurline Shelton   Encounter Date: 11/29/2017  PT End of Session - 11/29/17 1548    Visit Number  1    Date for PT Re-Evaluation  11/29/17    Authorization Type  [facility]    PT Start Time  1145    PT Stop Time  1230    PT Time Calculation (min)  45 min    Activity Tolerance  Patient tolerated treatment well    Behavior During Therapy  Katelyn Shelton for tasks assessed/performed       Past Medical History:  Diagnosis Date  . Anxiety   . Breast cancer ([place]) 01/2017   right; genetic testing negative in 2018 with [employer] panel  . Depression   . History of MRSA infection    elbow    Past Surgical History:  Procedure Laterality Date  . BREAST RECONSTRUCTION WITH PLACEMENT OF TISSUE EXPANDER AND FLEX HD (ACELLULAR HYDRATED DERMIS) Bilateral 02/28/2017   Procedure: BILATERAL BREAST RECONSTRUCTION WITH PLACEMENT OF TISSUE EXPANDER AND ALLODERM;  Surgeon: Katelyn Limbo, MD;  Location: [facility];  Service: Plastics;  Laterality: Bilateral;  . CESAREAN SECTION     x 2 (3 total)  . CESAREAN SECTION  02/04/2012   Procedure: CESAREAN SECTION;  Surgeon: Katelyn Mourning, MD;  Location: [facility];  Service: Gynecology;  Laterality: N/A;  Repeat Cesarean Section Delivery  Boy  @  [phone], Apgars 9/10  . DILATION AND CURETTAGE OF UTERUS    . EXCISION OF BREAST LESION Right 03/15/2017   Procedure: RIGHT NIPPLE AND AREOLA EXCISION;  Surgeon: Katelyn Bookbinder, MD;  Location: [facility];  Service: General;  Laterality: Right;  . NIPPLE SPARING MASTECTOMY Bilateral 02/28/2017   Procedure: RIGHT NIPPLE SPARING MASTECTOMY; LEFT PROPHYLACTIC NIPPLE SPARING MASTECTOMY;  Surgeon: Katelyn Bookbinder, MD;   Location: [facility];  Service: General;  Laterality: Bilateral;  . PECTUS EXCAVATUM REPAIR  1996  . SENTINEL NODE BIOPSY Right 02/28/2017   Procedure: RIGHT AXILLARY SENTINEL LYMPH  NODE BIOPSY;  Surgeon: Katelyn Bookbinder, MD;  Location: [facility];  Service: General;  Laterality: Right;  . VULVAR LESION REMOVAL    . WISDOM TOOTH EXTRACTION      There were no vitals filed for this visit.   Subjective Assessment - 11/29/17 1152    Subjective  Patient is taking aromatase and Zoladex.  [name] reports dryness and painful intercourse.     Pertinent History  42 y.o. [place] woman status post right breast upper outer quadrant biopsy 02/08/2017 a clinical T2 N0, stage IB invasive ductal carcinoma, grade 2, estrogen and progesterone receptor positive, HER-2 nonamplified, with an MIB-1 of 5%. Bilateral mastectomies with SLNB performed on R on 02/28/17, began Tamoxifen 02/11/17,    Patient Stated Goals  I don't know what my goal for therapy is    Currently in Pain?  Yes    Pain Score  8     Pain Location  Vagina    Pain Orientation  Mid    Pain Descriptors / Indicators  Burning    Pain Type  Acute pain    Pain Onset  More than a month ago    Pain Frequency  Intermittent    Aggravating Factors   intercourse  Pain Relieving Factors  no intercourse    Multiple Pain Sites  No         [facility] PT Assessment - 11/29/17 0001      Assessment   Medical Diagnosis  C50.911 Invasive ductal carcinoma of breast female right; C50.411,Z17.0 Malignant neoplasm of upper outer qudrant of right breast in female estrogen receptor positive    Referring Provider  Dr. Sarajane Jews Shelton    Onset Date/Surgical Date  02/28/17    Prior Therapy  1 PT session for lymphedema      Precautions   Precautions  Other (comment) at risk for lymphedema; osteopenia      Restrictions   Weight Bearing Restrictions  No      Balance Screen   Has the patient fallen in the past 6 months  Yes    How  many times?  1 fell on ice in Parking lot    Has the patient had a decrease in activity level because of a fear of falling?   No    Is the patient reluctant to leave their home because of a fear of falling?   No      Home Social worker  Private residence    Living Arrangements  Spouse/significant other;Children ages 52, 69, 87    Available Help at Discharge  Family    Type of [place]  Unemployed    Leisure  pt exercised daily before - running a couple of miles a day and spending 30 min on elliptical, now pt is walking an hour or more a day      Cognition   Overall Cognitive Status  Within Functional Limits for tasks assessed      Observation/Other Assessments   Focus on Therapeutic Outcomes (FOTO)   Patient is 15% limitation due to painful intercourse and vaginal dryness      Posture/Postural Control   Posture/Postural Control  No significant limitations      ROM / Strength   AROM / PROM / Strength  AROM;Strength      AROM   Overall AROM   Within functional limits for tasks performed    Overall AROM Comments  lumbar ROM is full      Strength   Overall Strength  Within functional limits for tasks performed      Transfers   Transfers  Not assessed      Ambulation/Gait   Ambulation/Gait  No             Objective measurements completed on examination: See above findings.    Pelvic Floor Special Questions - 11/29/17 0001    Prior Pregnancies  Yes    Number of Pregnancies  4    Number of C-Sections  3    Currently Sexually Active  Yes    Is this Painful  Yes    Marinoff Scale  discomfort that does not affect completion    Urinary Leakage  Yes    Exam Type  Deferred               PT Education - 11/29/17 1547    Education provided  Yes    Education Details  information on moisturizers and lubricants for the vaginal area; how lack of estrogen affects the pelvic  floor and what she can do for it;     Person(s) Educated  Patient  Methods  Explanation;Handout    Comprehension  Verbalized understanding          PT Long Term Goals - 11/29/17 1601      PT LONG TERM GOAL #1   Title  education on vaginal moistrizers and lubricants to improve vaginal health and decrease pain with intercourse    Time  1    Period  Days    Status  Achieved    Target Date  11/29/17      PT LONG TERM GOAL #2   Title  understand how lack of estrogen affects the pelvic floor and how to manage the symptoms    Time  1    Period  Days    Status  Achieved    Target Date  11/29/17        Long Term Clinic Goals - 04/11/17 1214      CC Long Term Goal  #1   Title  Pt to report a 75% decrease in left axillary tightness and pain with RUE movement to improve comfort.    Time  4    Period  Weeks    Status  New      CC Long Term Goal  #2   Title  Pt to be independent in a home exercise program for continued stretching and strengthening     Time  4    Period  Weeks    Status  New      CC Long Term Goal  #3   Title  Pt to be able to independently verbalize lymphedema risk reduction strategies    Time  4    Period  Weeks    Status  New          Plan - 11/29/17 1549    Clinical Impression Statement  Patient is a 42 year old female with vaginal dryness and pain with penile penetration.  Patient s/p right breast cancer  estrogen positive and masectomies on 02/28/2017.  Patient reports 8/10 with intercourse due to dryness.  Patient has used cocnut oil but is not as effective.  Patient reports she want her vaginal area  to be like it should prior to having breast cancer and is anxious. Patient deferred vaginal assesment.  Patient was instructed on how to use moisturizers, lubricants and other options to improve the vaginal dryness.  Patient understands how decreased estrogen affects the vaginal area.      History and Personal Factors relevant to plan of care:  right  breast cancer estrogen positive, taking Anastrozole    Clinical Presentation  Stable    Clinical Presentation due to:  stable     Clinical Decision Making  Low    Rehab Potential  Excellent    Clinical Impairments Affecting Rehab Potential  right breast cancer estrogen positive, taking Anastrozole    PT Frequency  One time visit    PT Duration  Other (comment) 1 time visit    PT Treatment/Interventions  Patient/family education    PT Next Visit Plan  education on vaginal moisturizers and lubricants, vaginal health, how estrogen affects pelvic floor when taking Anastrozole    PT Home Exercise Plan  Current HEP    Consulted and Agree with Plan of Care  Patient       Patient will benefit from skilled therapeutic intervention in order to improve the following deficits and impairments:  Pain  Visit Diagnosis: Other muscle spasm - Plan: PT plan of care cert/re-cert     Problem  List Patient Active Problem List   Diagnosis Date Noted  . Invasive ductal carcinoma of breast, female, right ([place]) 03/31/2017  . Genetic testing 02/21/2017  . Family history of prostate cancer   . Family history of stomach cancer   . Malignant neoplasm of upper-outer quadrant of right breast in female, estrogen receptor positive ([place]) 02/11/2017  . Hidradenitis suppurativa 08/26/2011  . HYPERLIPIDEMIA-MIXED 01/16/2010    Earlie Counts, PT 11/29/17 4:05 PM   [facility] [address], [place], [place], [address] Phone: [phone]   Fax:  [phone]  Name: Katelyn Shelton MRN: [id number] Date of Birth: 01-04-1976

## 2017-12-01 ENCOUNTER — Ambulatory Visit: Payer: 59 | Admitting: Family Medicine

## 2017-12-09 ENCOUNTER — Other Ambulatory Visit: Payer: Self-pay | Admitting: Oncology

## 2017-12-09 ENCOUNTER — Inpatient Hospital Stay: Payer: 59 | Attending: Oncology

## 2017-12-09 DIAGNOSIS — C50411 Malignant neoplasm of upper-outer quadrant of right female breast: Secondary | ICD-10-CM | POA: Insufficient documentation

## 2017-12-09 DIAGNOSIS — Z5111 Encounter for antineoplastic chemotherapy: Secondary | ICD-10-CM | POA: Diagnosis not present

## 2017-12-09 DIAGNOSIS — Z170 Estrogen receptor positive status [ER+]: Secondary | ICD-10-CM

## 2017-12-09 MED ORDER — GOSERELIN ACETATE 3.6 MG ~~LOC~~ IMPL
3.6000 mg | DRUG_IMPLANT | Freq: Once | SUBCUTANEOUS | Status: AC
  Administered 2017-12-09: 3.6 mg via SUBCUTANEOUS

## 2017-12-09 NOTE — Patient Instructions (Signed)
Goserelin injection What is this medicine? GOSERELIN (GOE se rel in) is similar to a hormone found in the body. It lowers the amount of sex hormones that the body makes. Men will have lower testosterone levels and women will have lower estrogen levels while taking this medicine. In men, this medicine is used to treat prostate cancer; the injection is either given once per month or once every 12 weeks. A once per month injection (only) is used to treat women with endometriosis, dysfunctional uterine bleeding, or advanced breast cancer. This medicine may be used for other purposes; ask your health care provider or pharmacist if you have questions. COMMON BRAND NAME(S): Zoladex What should I tell my health care provider before I take this medicine? They need to know if you have any of these conditions (some only apply to women): -diabetes -heart disease or previous heart attack -high blood pressure -high cholesterol -kidney disease -osteoporosis or low bone density -problems passing urine -spinal cord injury -stroke -tobacco smoker -an unusual or allergic reaction to goserelin, hormone therapy, other medicines, foods, dyes, or preservatives -pregnant or trying to get pregnant -breast-feeding How should I use this medicine? This medicine is for injection under the skin. It is given by a health care professional in a hospital or clinic setting. Men receive this injection once every 4 weeks or once every 12 weeks. Women will only receive the once every 4 weeks injection. Talk to your pediatrician regarding the use of this medicine in children. Special care may be needed. Overdosage: If you think you have taken too much of this medicine contact a poison control center or emergency room at once. NOTE: This medicine is only for you. Do not share this medicine with others. What if I miss a dose? It is important not to miss your dose. Call your doctor or health care professional if you are unable to  keep an appointment. What may interact with this medicine? -female hormones like estrogen -herbal or dietary supplements like black cohosh, chasteberry, or DHEA -female hormones like testosterone -prasterone This list may not describe all possible interactions. Give your health care provider a list of all the medicines, herbs, non-prescription drugs, or dietary supplements you use. Also tell them if you smoke, drink alcohol, or use illegal drugs. Some items may interact with your medicine. What should I watch for while using this medicine? Visit your doctor or health care professional for regular checks on your progress. Your symptoms may appear to get worse during the first weeks of this therapy. Tell your doctor or healthcare professional if your symptoms do not start to get better or if they get worse after this time. Your bones may get weaker if you take this medicine for a long time. If you smoke or frequently drink alcohol you may increase your risk of bone loss. A family history of osteoporosis, chronic use of drugs for seizures (convulsions), or corticosteroids can also increase your risk of bone loss. Talk to your doctor about how to keep your bones strong. This medicine should stop regular monthly menstration in women. Tell your doctor if you continue to menstrate. Women should not become pregnant while taking this medicine or for 12 weeks after stopping this medicine. Women should inform their doctor if they wish to become pregnant or think they might be pregnant. There is a potential for serious side effects to an unborn child. Talk to your health care professional or pharmacist for more information. Do not breast-feed an infant while taking   this medicine. Men should inform their doctors if they wish to father a child. This medicine may lower sperm counts. Talk to your health care professional or pharmacist for more information. What side effects may I notice from receiving this  medicine? Side effects that you should report to your doctor or health care professional as soon as possible: -allergic reactions like skin rash, itching or hives, swelling of the face, lips, or tongue -bone pain -breathing problems -changes in vision -chest pain -feeling faint or lightheaded, falls -fever, chills -pain, swelling, warmth in the leg -pain, tingling, numbness in the hands or feet -signs and symptoms of low blood pressure like dizziness; feeling faint or lightheaded, falls; unusually weak or tired -stomach pain -swelling of the ankles, feet, hands -trouble passing urine or change in the amount of urine -unusually high or low blood pressure -unusually weak or tired Side effects that usually do not require medical attention (report to your doctor or health care professional if they continue or are bothersome): -change in sex drive or performance -changes in breast size in both males and females -changes in emotions or moods -headache -hot flashes -irritation at site where injected -loss of appetite -skin problems like acne, dry skin -vaginal dryness This list may not describe all possible side effects. Call your doctor for medical advice about side effects. You may report side effects to FDA at 1-800-FDA-1088. Where should I keep my medicine? This drug is given in a hospital or clinic and will not be stored at home. NOTE: This sheet is a summary. It may not cover all possible information. If you have questions about this medicine, talk to your doctor, pharmacist, or health care provider.  2018 Elsevier/Gold Standard (2013-12-18 11:10:35)  

## 2017-12-09 NOTE — Progress Notes (Unsigned)
Katelyn Shelton stopped in today after receiving her goserelin shot.  We discussed denosumab/Prolia which she will start 12/30/2017.  She is aware of the possibility of osteonecrosis of the jaw although this is exceedingly rare at this dose and schedule.  She is also aware of the possibility of hypocalcemia, and bony aches and pains that may develop, and in general of the various possible side effects toxicities and complications of this drug.  She is very interested in receiving it.  Her first dose will be at the next visit 0308.

## 2017-12-19 DIAGNOSIS — C50911 Malignant neoplasm of unspecified site of right female breast: Secondary | ICD-10-CM | POA: Diagnosis not present

## 2017-12-19 DIAGNOSIS — F526 Dyspareunia not due to a substance or known physiological condition: Secondary | ICD-10-CM | POA: Diagnosis not present

## 2017-12-19 DIAGNOSIS — Z853 Personal history of malignant neoplasm of breast: Secondary | ICD-10-CM | POA: Diagnosis not present

## 2017-12-19 DIAGNOSIS — Z7189 Other specified counseling: Secondary | ICD-10-CM | POA: Diagnosis not present

## 2017-12-21 NOTE — Telephone Encounter (Signed)
No entry 

## 2017-12-22 ENCOUNTER — Ambulatory Visit: Payer: 59 | Admitting: Licensed Clinical Social Worker

## 2017-12-23 DIAGNOSIS — Z170 Estrogen receptor positive status [ER+]: Secondary | ICD-10-CM | POA: Diagnosis not present

## 2017-12-23 DIAGNOSIS — Z9013 Acquired absence of bilateral breasts and nipples: Secondary | ICD-10-CM | POA: Diagnosis not present

## 2017-12-23 DIAGNOSIS — Z923 Personal history of irradiation: Secondary | ICD-10-CM | POA: Diagnosis not present

## 2017-12-23 DIAGNOSIS — C50411 Malignant neoplasm of upper-outer quadrant of right female breast: Secondary | ICD-10-CM | POA: Diagnosis not present

## 2017-12-29 NOTE — Progress Notes (Signed)
[facility]  [phone]) [phone] Fax:(336) [phone]     ID: Katelyn Shelton DOB: Jun 24, 1976  MR#: [id number]  GEX#:[id number]  Patient Care Team: Chipper Herb, MD as PCP - General (Family Medicine) Kevaughn Ewing, Virgie Dad, MD as Consulting Physician (Oncology) Rolm Bookbinder, MD as Consulting Physician (General Surgery) Louretta Shorten, MD as Consulting Physician (Obstetrics and Gynecology) Juanita Craver Gerrie Nordmann, MD as Referring Physician (Hematology and Oncology) Kyung Rudd, MD as Consulting Physician (Radiation Oncology) Irene Limbo, MD as Consulting Physician (Plastic Surgery) Sorscher, Danice Goltz, MD as Referring Physician (Hematology and Oncology) Muss, Demaris Callander as Consulting Physician (Internal Medicine) OTHER MD:  CHIEF COMPLAINT: Estrogen receptor positive breast cancer  CURRENT TREATMENT: anastrozole, goserelin   INTERVAL HISTORY: Katelyn Shelton returns today for follow-up and treatment of her estrogen receptor positive breast cancer. She continues on anastrozole, with good tolerance. She has some hot flashes that occasionally wake her up at night. This has not been a big change, since being on anastrozole, She notes that she has arthralgias and myalgias in the middle of the night all over her body. She feel soreness in her hands during the day.  She also receives goserelin every 4 weeks, with a dose due today. She says she "hates" the shots. She is considering to have a total hysterectomy with BSO. She is still considering to have additional reconstruction, but she is thinking about having the hysterectomy/BSO during this procedure.   She completed a bone density scan on 11/25/2017 which showed a T score of -2.0 osteopenic.   REVIEW OF SYSTEMS: Katelyn Shelton reports that she is waiting until the fall season to [place] have her breast reconstruction and hysterectomy surgery. She denies unusual headaches, visual changes, nausea, vomiting, or dizziness. There has been no  unusual cough, phlegm production, or pleurisy. This been no change in bowel or bladder habits. She denies unexplained fatigue or unexplained weight loss, bleeding, rash, or fever. A detailed review of systems was otherwise stable.    BREAST CANCER HISTORY: From the original intake note:  "Katelyn Shelton" saw Dr. Corinna Capra for routine follow-up and was found to have a palpable mass in the upper outer quadrant of her right breast and was set up for bilateral diagnostic mammography with tomography and right breast ultrasonography at the [facility] 02/08/2017. The breast density was category C. In the upper outer quadrant of the right breast there was an irregular mass. There was also a 0.3 cm group of slightly heterogeneous calcifications in the retroareolar left breast. On exam there was indeed a firm palpable mass at the 10:00 position of the right breast 4 cm from the nipple. On ultrasound this measured 3.5 cm, it was irregular and hypoechoic. There was no abnormal right axillary adenopathy.  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 The area of left breast calcifications was biopsied 02/09/2017 and showed only fibrocystic changes. (SAA [id number]).  Her subsequent history is as detailed below.  PAST MEDICAL HISTORY: Past Medical History:  Diagnosis Date  . Anxiety   . Breast cancer ([place]) 01/2017   right; genetic testing negative in 2018 with [facility] panel  . Depression   . History of MRSA infection    elbow    PAST SURGICAL HISTORY: Past Surgical History:  Procedure Laterality Date  . BREAST RECONSTRUCTION WITH PLACEMENT OF TISSUE EXPANDER  AND FLEX HD (ACELLULAR HYDRATED DERMIS) Bilateral 02/28/2017   Procedure: BILATERAL BREAST RECONSTRUCTION WITH PLACEMENT OF TISSUE EXPANDER AND  ALLODERM;  Surgeon: Irene Limbo, MD;  Location: [facility];  Service: Plastics;  Laterality: Bilateral;  . CESAREAN SECTION     x 2 (3 total)  . CESAREAN SECTION  02/04/2012   Procedure: CESAREAN SECTION;  Surgeon: Cyril Mourning, MD;  Location: [facility];  Service: Gynecology;  Laterality: N/A;  Repeat Cesarean Section Delivery  Boy  @  [phone], Apgars 9/10  . DILATION AND CURETTAGE OF UTERUS    . EXCISION OF BREAST LESION Right 03/15/2017   Procedure: RIGHT NIPPLE AND AREOLA EXCISION;  Surgeon: Rolm Bookbinder, MD;  Location: [facility];  Service: General;  Laterality: Right;  . NIPPLE SPARING MASTECTOMY Bilateral 02/28/2017   Procedure: RIGHT NIPPLE SPARING MASTECTOMY; LEFT PROPHYLACTIC NIPPLE SPARING MASTECTOMY;  Surgeon: Rolm Bookbinder, MD;  Location: [facility];  Service: General;  Laterality: Bilateral;  . PECTUS EXCAVATUM REPAIR  1996  . SENTINEL NODE BIOPSY Right 02/28/2017   Procedure: RIGHT AXILLARY SENTINEL LYMPH  NODE BIOPSY;  Surgeon: Rolm Bookbinder, MD;  Location: [facility];  Service: General;  Laterality: Right;  . VULVAR LESION REMOVAL    . WISDOM TOOTH EXTRACTION      FAMILY HISTORY Family History  Problem Relation Age of Onset  . Skin cancer Mother   . Parkinson's disease Sister 66  . Diabetes Maternal Aunt   . Heart failure Maternal Grandmother   . Heart attack Maternal Grandfather   . Stomach cancer Paternal Grandmother   . Prostate cancer Paternal Grandfather   . Alzheimer's disease Paternal Grandfather   . Breast cancer Cousin        mother's paternal first FEMALE cousin   The patient's father, Carolyne Fiscal, is an Administrator, Civil Service, currently 41 years old. The patient's mother is 31 years old as of April 2018. Patient has no brothers, 2 sisters. There is a history breast cancer in the family in a great aunt on the patient's father's side. On the maternal side there is a female first cousin of the patient's  mother with breast cancer. There is no history of ovarian cancer in the family  GYNECOLOGIC HISTORY:  No LMP recorded. Katelyn Shelton had her first menstrual period age 58, her first live birth age 65. She has had 3 C-sections. Her periods are approximately every 21 days, with one heavy day. Her husband is status post vasectomy.  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 Her husband [name] is one of our cardiologists. The patient attends [employer] DIRECTIVES:    HEALTH MAINTENANCE: Social History   Tobacco Use  . Smoking status: Never Smoker  . Smokeless tobacco: Never Used  Substance Use Topics  . Alcohol use: No  . Drug use: No     Colonoscopy:  PAP:  Bone density: 11/25/2017 showed a T score of -2.0 osteopenic   Allergies  Allergen Reactions  . Peach Flavor Anaphylaxis  . Fentanyl Itching  . Penicillins Hives    AS A CHILD  . Sulfa Antibiotics Nausea And Vomiting  . Sulfasalazine Nausea And Vomiting and Other (See Comments)  . Latex Itching    Current Outpatient Medications  Medication Sig Dispense Refill  . acetaminophen (TYLENOL) 325 MG tablet Take 650 mg by mouth every 6 (six) hours as needed.    [name]  anastrozole (ARIMIDEX) 1 MG tablet Take 1 tablet (1 mg total) by mouth daily. 90 tablet 4  . doxycycline (DORYX) 100 MG EC tablet Take 100 mg by mouth daily.    . fluticasone (FLONASE) 50 MCG/ACT nasal spray Place 1 spray into both nostrils daily as needed.    [name] LORazepam (ATIVAN) 0.5 MG tablet TAKE 1/2 TO 1 TABLET BY MOUTH AT BEDTIME AS NEEDED FOR ANXIETY 30 tablet 3  . rosuvastatin (CRESTOR) 5 MG tablet Take 1 tablet (5 mg total) by mouth daily. 90 tablet 3  . venlafaxine XR (EFFEXOR-XR) 75 MG 24 hr capsule Take 1 capsule (75 mg total) by mouth daily with breakfast. 90 capsule 3   No current facility-administered medications for this visit.    Facility-Administered Medications Ordered  in Other Visits  Medication Dose Route Frequency Provider Last Rate Last Dose  . denosumab (PROLIA) injection 60 mg  60 mg Subcutaneous Once Raahi Korber, Virgie Dad, MD        OBJECTIVE: Young white woman in no acute distress  Vitals:   12/30/17 1155  BP: 110/71  Pulse: 68  Resp: 20  Temp: 98.4 F (36.9 C)  SpO2: 100%     Body mass index is 24.86 kg/m.    ECOG FS:1 - Symptomatic but completely ambulatory   Sclerae unicteric, pupils round and equal Oropharynx clear and moist No cervical or supraclavicular adenopathy Lungs no rales or rhonchi Heart regular rate and rhythm Abd soft, nontender, positive bowel sounds MSK no focal spinal tenderness, no upper extremity lymphedema Neuro: nonfocal, well oriented, appropriate affect Breasts: Status post bilateral mastectomies with bilateral implants.  There is no evidence of local recurrence.  Both axillae are benign.  LAB RESULTS:  CMP     Component Value Date/Time   NA 140 12/30/2017 1134   NA 140 08/19/2017 1337   K 3.7 12/30/2017 1134   K 3.6 08/19/2017 1337   CL 101 12/30/2017 1134   CO2 28 12/30/2017 1134   CO2 24 08/19/2017 1337   GLUCOSE 102 12/30/2017 1134   GLUCOSE 88 08/19/2017 1337   BUN 16 12/30/2017 1134   BUN 10.9 08/19/2017 1337   CREATININE 0.72 12/30/2017 1134   CREATININE 0.7 08/19/2017 1337   CALCIUM 10.0 12/30/2017 1134   CALCIUM 9.0 08/19/2017 1337   PROT 7.7 12/30/2017 1134   PROT 6.6 08/19/2017 1337   ALBUMIN 4.2 12/30/2017 1134   ALBUMIN 3.9 08/19/2017 1337   AST 26 12/30/2017 1134   AST 35 (H) 08/19/2017 1337   ALT 29 12/30/2017 1134   ALT 55 08/19/2017 1337   ALKPHOS 60 12/30/2017 1134   ALKPHOS 61 08/19/2017 1337   BILITOT 0.7 12/30/2017 1134   BILITOT 0.41 08/19/2017 1337   GFRNONAA >60 12/30/2017 1134   GFRAA >60 12/30/2017 1134    No results found for: TOTALPROTELP, ALBUMINELP, A1GS, A2GS, BETS, BETA2SER, GAMS, MSPIKE, SPEI  No results found for: KPAFRELGTCHN, LAMBDASER,  [facility]  Lab Results  Component Value Date   WBC 4.6 12/30/2017   NEUTROABS 3.1 12/30/2017   HGB 12.2 08/19/2017   HCT 41.8 12/30/2017   MCV 95.4 12/30/2017   PLT 208 12/30/2017      Chemistry      Component Value Date/Time   NA 140 12/30/2017 1134   NA 140 08/19/2017 1337   K 3.7 12/30/2017 1134   K 3.6 08/19/2017 1337   CL 101 12/30/2017 1134   CO2 28 12/30/2017 1134   CO2 24 08/19/2017 1337   BUN 16  12/30/2017 1134   BUN 10.9 08/19/2017 1337   CREATININE 0.72 12/30/2017 1134   CREATININE 0.7 08/19/2017 1337      Component Value Date/Time   CALCIUM 10.0 12/30/2017 1134   CALCIUM 9.0 08/19/2017 1337   ALKPHOS 60 12/30/2017 1134   ALKPHOS 61 08/19/2017 1337   AST 26 12/30/2017 1134   AST 35 (H) 08/19/2017 1337   ALT 29 12/30/2017 1134   ALT 55 08/19/2017 1337   BILITOT 0.7 12/30/2017 1134   BILITOT 0.41 08/19/2017 1337       No results found for: LABCA2  No components found for: [id number]  No results for input(s): INR in the last 168 hours.  Urinalysis    Component Value Date/Time   COLORURINE YELLOW 08/26/2011 1652   APPEARANCEUR CLEAR 08/26/2011 1652   LABSPEC 1.025 12/21/2013 1226   PHURINE 6.0 12/21/2013 1226   GLUCOSEU NEGATIVE 12/21/2013 1226   HGBUR TRACE (A) 12/21/2013 1226   BILIRUBINUR neg 07/12/2014 0913   KETONESUR NEGATIVE 12/21/2013 1226   PROTEINUR 4+ 07/12/2014 0913   PROTEINUR NEGATIVE 12/21/2013 1226   UROBILINOGEN negative 07/12/2014 0913   UROBILINOGEN 0.2 12/21/2013 1226   NITRITE neg 07/12/2014 0913   NITRITE NEGATIVE 12/21/2013 1226   LEUKOCYTESUR small (1+) 07/12/2014 0913     STUDIES: No results found.   ELIGIBLE FOR AVAILABLE RESEARCH PROTOCOL: no  ASSESSMENT: 42 y.o. [place] woman status post right breast upper outer quadrant biopsy 02/08/2017 for a clinical T2 N0, stage IB invasive ductal carcinoma, grade 2, estrogen and progesterone receptor positive, HER-2 nonamplified, with an MIB-1 of 5%.  (a) biopsy  of upper outer quadrant calcifications in the left breast 02/09/2017 were benign  (1) Oncotype DX obtained from the biopsy sample showed a recurrence score of 11 predicting a 10 year risk of recurrence outside the breast of 9% if the patient's only systemic therapy is tamoxifen for 5 years (node positive report)  (2) bilateral mastectomies with right sentinel lymph node sampling 02/28/2017 showed  (a) on the left, intraductal papilloma, with no evidence of malignancy.  (b) on the right, a pT2 pN1 stage IIA invasive ductal carcinoma, grade 1, with close margins  (c) additional excision of the right nipple areolar area 03/15/2017 found no residual carcinoma  (3) Mammaprint study on the final surgical sample came back low risk, predicting a five-year distant disease-free survival of 97.8% with hormone therapy alone   (3) tamoxifen started neoadjuvantly 02/11/2017, Stopped 04/11/2017 in preparation for chemotherapy  (4) genetics counseling 02/19/2017 showed no deleterious mutations in the STAT gene panel offered by [facility] including ATM, BRCA1, BRCA2, CDH1, CHEK2, PALB2, PTEN, STK11, and TP53.  (a) multi gene panel February 21, 2017 through the  Multi-Gene Panel offered by [facility] found no deleterious mutations in ALK, APC, ATM, AXIN2,BAP1,  BARD1, BLM, BMPR1A, BRCA1, BRCA2, BRIP1, CASR, CDC73, CDH1, CDK4, CDKN1B, CDKN1C, CDKN2A (p14ARF), CDKN2A (p16INK4a), CEBPA, CHEK2, CTNNA1, DICER1, DIS3L2, EGFR (c.2369C>T, p.Thr790Met variant only), EPCAM (Deletion/duplication testing only), FH, FLCN, GATA2, GPC3, GREM1 (Promoter region deletion/duplication testing only), HOXB13 (c.251G>A, p.Gly84Glu), HRAS, KIT, MAX, MEN1, MET, MITF (c.952G>A, p.Glu318Lys variant only), MLH1, MSH2, MSH3, MSH6, MUTYH, NBN, NF1, NF2, NTHL1, PALB2, PDGFRA, PHOX2B, PMS2, POLD1, POLE, POT1, PRKAR1A, PTCH1, PTEN, RAD50, RAD51C, RAD51D, RB1, RECQL4, RET, RUNX1, SDHAF2, SDHA (sequence changes only), SDHB, SDHC, SDHD, SMAD4, SMARCA4,  SMARCB1, SMARCE1, STK11, SUFU, TERT, TERT, TMEM127, TP53, TSC1, TSC2, VHL, WRN and WT1.   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  (9) significant hypercholesterolemia   PLAN: Katelyn Shelton still has some hyperpigmentation over the radiation port.  I do hope this will eventually completely resolved.  She still has not quite decided what to do with her breasts, whether to complete the reconstruction that she started here, or go somewhere else for a D IEP.  She has been told she is not a particularly good D IEP candidate.  She would like to get off the goserelin but does not want to have a laparoscopic bilateral salpingo-oophorectomy because she already has too many scars, she says.  She would like to combine a hysterectomy and bilateral salpingo-oophorectomy with her DIP procedure but the problem is she has not decided to have that.  Ensured after much discussion we are continuing as before with go [place] every month.  As far as the vaginal dryness issue is concerned she did go through the pelvic health program here and is considering the Josph Macho touch to her gynecologist.  She is aware of the possible toxicity side effects and complications of denosumab/Prolia.  She will have her first dose today.  She is taking Tums today to prevent hypocalcemia issues  Otherwise she will return to see me in July.  She knows to call for any problems that may develop before that visit.    Lariah Fleer, Virgie Dad, MD  12/30/17 12:44 PM Medical Oncology and Hematology [facility] [address] [place], [place] [address] Tel. [phone]    Fax. [phone]  This document serves as a record of services personally performed by Lurline Del, MD. It was created on his behalf by Sheron Nightingale, a trained medical scribe. The creation of this record is based on the scribe's personal observations and the provider's statements to them.   I have reviewed the above documentation for accuracy and completeness, and I agree with the above.

## 2017-12-30 ENCOUNTER — Inpatient Hospital Stay: Payer: 59

## 2017-12-30 ENCOUNTER — Inpatient Hospital Stay: Payer: 59 | Attending: Oncology

## 2017-12-30 ENCOUNTER — Ambulatory Visit: Payer: Self-pay

## 2017-12-30 ENCOUNTER — Telehealth: Payer: Self-pay | Admitting: Oncology

## 2017-12-30 ENCOUNTER — Ambulatory Visit: Payer: Self-pay | Admitting: Oncology

## 2017-12-30 ENCOUNTER — Inpatient Hospital Stay (HOSPITAL_BASED_OUTPATIENT_CLINIC_OR_DEPARTMENT_OTHER): Payer: 59 | Admitting: Oncology

## 2017-12-30 ENCOUNTER — Other Ambulatory Visit: Payer: Self-pay | Admitting: *Deleted

## 2017-12-30 ENCOUNTER — Other Ambulatory Visit: Payer: Self-pay

## 2017-12-30 VITALS — BP 110/71 | HR 68 | Temp 98.4°F | Resp 20 | Ht 60.0 in | Wt 127.3 lb

## 2017-12-30 DIAGNOSIS — Z79811 Long term (current) use of aromatase inhibitors: Secondary | ICD-10-CM | POA: Insufficient documentation

## 2017-12-30 DIAGNOSIS — Z923 Personal history of irradiation: Secondary | ICD-10-CM

## 2017-12-30 DIAGNOSIS — M8580 Other specified disorders of bone density and structure, unspecified site: Secondary | ICD-10-CM

## 2017-12-30 DIAGNOSIS — N952 Postmenopausal atrophic vaginitis: Secondary | ICD-10-CM | POA: Diagnosis not present

## 2017-12-30 DIAGNOSIS — C50411 Malignant neoplasm of upper-outer quadrant of right female breast: Secondary | ICD-10-CM

## 2017-12-30 DIAGNOSIS — Z79899 Other long term (current) drug therapy: Secondary | ICD-10-CM | POA: Diagnosis not present

## 2017-12-30 DIAGNOSIS — Z9013 Acquired absence of bilateral breasts and nipples: Secondary | ICD-10-CM | POA: Diagnosis not present

## 2017-12-30 DIAGNOSIS — Z170 Estrogen receptor positive status [ER+]: Principal | ICD-10-CM

## 2017-12-30 DIAGNOSIS — Z8042 Family history of malignant neoplasm of prostate: Secondary | ICD-10-CM | POA: Diagnosis not present

## 2017-12-30 DIAGNOSIS — L819 Disorder of pigmentation, unspecified: Secondary | ICD-10-CM | POA: Insufficient documentation

## 2017-12-30 DIAGNOSIS — M791 Myalgia, unspecified site: Secondary | ICD-10-CM | POA: Insufficient documentation

## 2017-12-30 DIAGNOSIS — Z9221 Personal history of antineoplastic chemotherapy: Secondary | ICD-10-CM | POA: Diagnosis not present

## 2017-12-30 DIAGNOSIS — Z808 Family history of malignant neoplasm of other organs or systems: Secondary | ICD-10-CM | POA: Insufficient documentation

## 2017-12-30 DIAGNOSIS — N951 Menopausal and female climacteric states: Secondary | ICD-10-CM | POA: Diagnosis not present

## 2017-12-30 DIAGNOSIS — E7800 Pure hypercholesterolemia, unspecified: Secondary | ICD-10-CM

## 2017-12-30 DIAGNOSIS — Z803 Family history of malignant neoplasm of breast: Secondary | ICD-10-CM | POA: Diagnosis not present

## 2017-12-30 DIAGNOSIS — Z800 Family history of malignant neoplasm of digestive organs: Secondary | ICD-10-CM | POA: Diagnosis not present

## 2017-12-30 DIAGNOSIS — C50911 Malignant neoplasm of unspecified site of right female breast: Secondary | ICD-10-CM

## 2017-12-30 DIAGNOSIS — R232 Flushing: Secondary | ICD-10-CM | POA: Insufficient documentation

## 2017-12-30 LAB — CBC WITH DIFFERENTIAL (CANCER CENTER ONLY)
Basophils Absolute: 0 10*3/uL (ref 0.0–0.1)
Basophils Relative: 1 %
Eosinophils Absolute: 0.1 10*3/uL (ref 0.0–0.5)
Eosinophils Relative: 2 %
HEMATOCRIT: 41.8 % (ref 34.8–46.6)
HEMOGLOBIN: 14.4 g/dL (ref 11.6–15.9)
LYMPHS ABS: 1.1 10*3/uL (ref 0.9–3.3)
Lymphocytes Relative: 23 %
MCH: 32.9 pg (ref 25.1–34.0)
MCHC: 34.4 g/dL (ref 31.5–36.0)
MCV: 95.4 fL (ref 79.5–101.0)
MONOS PCT: 8 %
Monocytes Absolute: 0.4 10*3/uL (ref 0.1–0.9)
NEUTROS ABS: 3.1 10*3/uL (ref 1.5–6.5)
NEUTROS PCT: 66 %
Platelet Count: 208 10*3/uL (ref 145–400)
RBC: 4.38 MIL/uL (ref 3.70–5.45)
RDW: 12.7 % (ref 11.2–14.5)
WBC Count: 4.6 10*3/uL (ref 3.9–10.3)

## 2017-12-30 LAB — COMPREHENSIVE METABOLIC PANEL
ALT: 29 U/L (ref 0–55)
ANION GAP: 11 (ref 3–11)
AST: 26 U/L (ref 5–34)
Albumin: 4.2 g/dL (ref 3.5–5.0)
Alkaline Phosphatase: 60 U/L (ref 40–150)
BUN: 16 mg/dL (ref 7–26)
CO2: 28 mmol/L (ref 22–29)
Calcium: 10 mg/dL (ref 8.4–10.4)
Chloride: 101 mmol/L (ref 98–109)
Creatinine, Ser: 0.72 mg/dL (ref 0.60–1.10)
GFR calc non Af Amer: >60 mL/min (ref >60)
Glucose, Bld: 102 mg/dL (ref 70–140)
Potassium: 3.7 mmol/L (ref 3.5–5.1)
SODIUM: 140 mmol/L (ref 136–145)
Total Bilirubin: 0.7 mg/dL (ref 0.2–1.2)
Total Protein: 7.7 g/dL (ref 6.4–8.3)

## 2017-12-30 LAB — RETICULOCYTES
RBC.: 4.38 MIL/uL (ref 3.70–5.45)
RETIC COUNT ABSOLUTE: 65.7 10*3/uL (ref 33.7–90.7)
Retic Ct Pct: 1.5 % (ref 0.7–2.1)

## 2017-12-30 MED ORDER — DENOSUMAB 60 MG/ML ~~LOC~~ SOLN
SUBCUTANEOUS | Status: AC
  Filled 2017-12-30: qty 1

## 2017-12-30 MED ORDER — DENOSUMAB 60 MG/ML ~~LOC~~ SOLN
60.0000 mg | Freq: Once | SUBCUTANEOUS | Status: AC
  Administered 2017-12-30: 60 mg via SUBCUTANEOUS

## 2017-12-30 MED ORDER — DENOSUMAB 60 MG/ML ~~LOC~~ SOLN: 60.0000 mg | Freq: Once | SUBCUTANEOUS | Status: DC

## 2017-12-30 MED ORDER — GOSERELIN ACETATE 3.6 MG ~~LOC~~ IMPL
DRUG_IMPLANT | SUBCUTANEOUS | Status: AC
  Filled 2017-12-30: qty 3.6

## 2017-12-30 NOTE — Telephone Encounter (Signed)
Patient decline avs and calendar °

## 2017-12-30 NOTE — Patient Instructions (Signed)

## 2017-12-31 LAB — FOLLICLE STIMULATING HORMONE: FSH: 10.5 m[IU]/mL

## 2018-01-03 MED FILL — ANASTROZOLE 1 MG TABLET: 1 | 90 days supply | Qty: 90 | Fill #1

## 2018-01-05 ENCOUNTER — Other Ambulatory Visit: Payer: Self-pay | Admitting: Oncology

## 2018-01-05 ENCOUNTER — Telehealth: Payer: Self-pay | Admitting: Pharmacist

## 2018-01-05 DIAGNOSIS — R634 Abnormal weight loss: Secondary | ICD-10-CM

## 2018-01-05 DIAGNOSIS — S0101XA Laceration without foreign body of scalp, initial encounter: Secondary | ICD-10-CM | POA: Diagnosis not present

## 2018-01-05 DIAGNOSIS — R635 Abnormal weight gain: Secondary | ICD-10-CM

## 2018-01-05 LAB — ESTRADIOL, ULTRA SENS

## 2018-01-05 NOTE — Telephone Encounter (Signed)
Oral Oncology Pharmacist Encounter  Received call from patient on 01/04/2018 with questions about medication interactions with her anastrozole.  Patient stated she was picking up her anastrozole from the [facility] and they suggested she contact oral oncology clinic with further questions.  Patient currently taking supplement Tonalin (linoleic acid) and garcinia. Researched herbal medications on [facility] herbal supplement website. Linoleic acid appears benign without medication interactions and without ill effects with patient's current treatment. Garcinia appears to increase serotonin release as it is mechanism of appetite suppression and energy stimulation.  Patient cautioned against long-term use of this medication. Does not have interactions with anastrozole.  Current medication list reviewed and updated.  Oral Oncology Clinic will continue to follow.  Johny Drilling, PharmD, BCPS, BCOP 01/05/2018 12:49 PM Oral Oncology Clinic [phone]

## 2018-01-06 ENCOUNTER — Inpatient Hospital Stay: Payer: 59

## 2018-01-06 ENCOUNTER — Other Ambulatory Visit: Payer: Self-pay

## 2018-01-06 ENCOUNTER — Ambulatory Visit: Payer: Self-pay

## 2018-01-06 DIAGNOSIS — Z170 Estrogen receptor positive status [ER+]: Principal | ICD-10-CM

## 2018-01-06 DIAGNOSIS — R232 Flushing: Secondary | ICD-10-CM | POA: Diagnosis not present

## 2018-01-06 DIAGNOSIS — M8580 Other specified disorders of bone density and structure, unspecified site: Secondary | ICD-10-CM | POA: Diagnosis not present

## 2018-01-06 DIAGNOSIS — Z79811 Long term (current) use of aromatase inhibitors: Secondary | ICD-10-CM | POA: Diagnosis not present

## 2018-01-06 DIAGNOSIS — C50411 Malignant neoplasm of upper-outer quadrant of right female breast: Secondary | ICD-10-CM

## 2018-01-06 DIAGNOSIS — M791 Myalgia, unspecified site: Secondary | ICD-10-CM | POA: Diagnosis not present

## 2018-01-06 DIAGNOSIS — L819 Disorder of pigmentation, unspecified: Secondary | ICD-10-CM | POA: Diagnosis not present

## 2018-01-06 DIAGNOSIS — Z9013 Acquired absence of bilateral breasts and nipples: Secondary | ICD-10-CM | POA: Diagnosis not present

## 2018-01-06 DIAGNOSIS — E7800 Pure hypercholesterolemia, unspecified: Secondary | ICD-10-CM | POA: Diagnosis not present

## 2018-01-06 MED ORDER — GOSERELIN ACETATE 3.6 MG ~~LOC~~ IMPL
DRUG_IMPLANT | SUBCUTANEOUS | Status: AC
Start: 2018-01-06 — End: 2018-01-06
  Filled 2018-01-06: qty 3.6

## 2018-01-06 MED ORDER — GOSERELIN ACETATE 3.6 MG ~~LOC~~ IMPL
3.6000 mg | DRUG_IMPLANT | Freq: Once | SUBCUTANEOUS | Status: AC
  Administered 2018-01-06: 3.6 mg via SUBCUTANEOUS

## 2018-01-06 NOTE — Patient Instructions (Signed)
Goserelin injection What is this medicine? GOSERELIN (GOE se rel in) is similar to a hormone found in the body. It lowers the amount of sex hormones that the body makes. Men will have lower testosterone levels and women will have lower estrogen levels while taking this medicine. In men, this medicine is used to treat prostate cancer; the injection is either given once per month or once every 12 weeks. A once per month injection (only) is used to treat women with endometriosis, dysfunctional uterine bleeding, or advanced breast cancer. This medicine may be used for other purposes; ask your health care provider or pharmacist if you have questions. COMMON BRAND NAME(S): Zoladex What should I tell my health care provider before I take this medicine? They need to know if you have any of these conditions (some only apply to women): -diabetes -heart disease or previous heart attack -high blood pressure -high cholesterol -kidney disease -osteoporosis or low bone density -problems passing urine -spinal cord injury -stroke -tobacco smoker -an unusual or allergic reaction to goserelin, hormone therapy, other medicines, foods, dyes, or preservatives -pregnant or trying to get pregnant -breast-feeding How should I use this medicine? This medicine is for injection under the skin. It is given by a health care professional in a hospital or clinic setting. Men receive this injection once every 4 weeks or once every 12 weeks. Women will only receive the once every 4 weeks injection. Talk to your pediatrician regarding the use of this medicine in children. Special care may be needed. Overdosage: If you think you have taken too much of this medicine contact a poison control center or emergency room at once. NOTE: This medicine is only for you. Do not share this medicine with others. What if I miss a dose? It is important not to miss your dose. Call your doctor or health care professional if you are unable to  keep an appointment. What may interact with this medicine? -female hormones like estrogen -herbal or dietary supplements like black cohosh, chasteberry, or DHEA -female hormones like testosterone -prasterone This list may not describe all possible interactions. Give your health care provider a list of all the medicines, herbs, non-prescription drugs, or dietary supplements you use. Also tell them if you smoke, drink alcohol, or use illegal drugs. Some items may interact with your medicine. What should I watch for while using this medicine? Visit your doctor or health care professional for regular checks on your progress. Your symptoms may appear to get worse during the first weeks of this therapy. Tell your doctor or healthcare professional if your symptoms do not start to get better or if they get worse after this time. Your bones may get weaker if you take this medicine for a long time. If you smoke or frequently drink alcohol you may increase your risk of bone loss. A family history of osteoporosis, chronic use of drugs for seizures (convulsions), or corticosteroids can also increase your risk of bone loss. Talk to your doctor about how to keep your bones strong. This medicine should stop regular monthly menstration in women. Tell your doctor if you continue to menstrate. Women should not become pregnant while taking this medicine or for 12 weeks after stopping this medicine. Women should inform their doctor if they wish to become pregnant or think they might be pregnant. There is a potential for serious side effects to an unborn child. Talk to your health care professional or pharmacist for more information. Do not breast-feed an infant while taking   this medicine. Men should inform their doctors if they wish to father a child. This medicine may lower sperm counts. Talk to your health care professional or pharmacist for more information. What side effects may I notice from receiving this  medicine? Side effects that you should report to your doctor or health care professional as soon as possible: -allergic reactions like skin rash, itching or hives, swelling of the face, lips, or tongue -bone pain -breathing problems -changes in vision -chest pain -feeling faint or lightheaded, falls -fever, chills -pain, swelling, warmth in the leg -pain, tingling, numbness in the hands or feet -signs and symptoms of low blood pressure like dizziness; feeling faint or lightheaded, falls; unusually weak or tired -stomach pain -swelling of the ankles, feet, hands -trouble passing urine or change in the amount of urine -unusually high or low blood pressure -unusually weak or tired Side effects that usually do not require medical attention (report to your doctor or health care professional if they continue or are bothersome): -change in sex drive or performance -changes in breast size in both males and females -changes in emotions or moods -headache -hot flashes -irritation at site where injected -loss of appetite -skin problems like acne, dry skin -vaginal dryness This list may not describe all possible side effects. Call your doctor for medical advice about side effects. You may report side effects to FDA at 1-800-FDA-1088. Where should I keep my medicine? This drug is given in a hospital or clinic and will not be stored at home. NOTE: This sheet is a summary. It may not cover all possible information. If you have questions about this medicine, talk to your doctor, pharmacist, or health care provider.  2018 Elsevier/Gold Standard (2013-12-18 11:10:35)  

## 2018-01-11 ENCOUNTER — Other Ambulatory Visit: Payer: Self-pay | Admitting: *Deleted

## 2018-01-11 DIAGNOSIS — C50411 Malignant neoplasm of upper-outer quadrant of right female breast: Secondary | ICD-10-CM

## 2018-01-11 DIAGNOSIS — Z170 Estrogen receptor positive status [ER+]: Principal | ICD-10-CM

## 2018-01-12 ENCOUNTER — Inpatient Hospital Stay: Payer: 59

## 2018-01-12 DIAGNOSIS — Z170 Estrogen receptor positive status [ER+]: Principal | ICD-10-CM

## 2018-01-12 DIAGNOSIS — L819 Disorder of pigmentation, unspecified: Secondary | ICD-10-CM | POA: Diagnosis not present

## 2018-01-12 DIAGNOSIS — E7800 Pure hypercholesterolemia, unspecified: Secondary | ICD-10-CM | POA: Diagnosis not present

## 2018-01-12 DIAGNOSIS — M8580 Other specified disorders of bone density and structure, unspecified site: Secondary | ICD-10-CM | POA: Diagnosis not present

## 2018-01-12 DIAGNOSIS — Z9013 Acquired absence of bilateral breasts and nipples: Secondary | ICD-10-CM | POA: Diagnosis not present

## 2018-01-12 DIAGNOSIS — R232 Flushing: Secondary | ICD-10-CM | POA: Diagnosis not present

## 2018-01-12 DIAGNOSIS — Z79811 Long term (current) use of aromatase inhibitors: Secondary | ICD-10-CM | POA: Diagnosis not present

## 2018-01-12 DIAGNOSIS — M791 Myalgia, unspecified site: Secondary | ICD-10-CM | POA: Diagnosis not present

## 2018-01-12 DIAGNOSIS — C50411 Malignant neoplasm of upper-outer quadrant of right female breast: Secondary | ICD-10-CM | POA: Diagnosis not present

## 2018-01-12 LAB — LIPID PANEL
Cholesterol: 230 mg/dL — ABNORMAL HIGH (ref 0–200)
HDL: 50 mg/dL (ref >40)
LDL CALC: 155 mg/dL — AB (ref 0–99)
TRIGLYCERIDES: 125 mg/dL (ref <150)
Total CHOL/HDL Ratio: 4.6 RATIO
VLDL: 25 mg/dL (ref 0–40)

## 2018-01-13 ENCOUNTER — Other Ambulatory Visit: Payer: Self-pay | Admitting: Oncology

## 2018-01-13 ENCOUNTER — Encounter: Payer: Self-pay | Admitting: Oncology

## 2018-01-13 DIAGNOSIS — E7849 Other hyperlipidemia: Secondary | ICD-10-CM

## 2018-01-13 DIAGNOSIS — C50411 Malignant neoplasm of upper-outer quadrant of right female breast: Secondary | ICD-10-CM

## 2018-01-13 DIAGNOSIS — Z170 Estrogen receptor positive status [ER+]: Principal | ICD-10-CM

## 2018-01-13 DIAGNOSIS — L732 Hidradenitis suppurativa: Secondary | ICD-10-CM

## 2018-01-13 MED ORDER — PITAVASTATIN CALCIUM 2 MG PO TABS: 2.0000 mg | ORAL_TABLET | ORAL | 6 refills | Status: DC

## 2018-01-13 MED FILL — LIVALO 2 MG TABLET: 2 | 30 days supply | Qty: 15 | Fill #0

## 2018-01-13 NOTE — Progress Notes (Signed)
I called Katelyn Shelton to discuss the results of her lipid panel which showed high LDL.  I think she would benefit from a statin but she tells me she has had problems with a variety of them.  We are going to try repeat of a statin, 2 mg, twice a week and see if this works for her assuming she tolerates it.  I will set her up for repeat lipid panel in about 3 months.  I would like for her to have a primary care physician to handle this type of problems.  She has been using her father's office of course in an informal way but I think it would be better if she had her own doctor and I am referring her to Dr. 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  She was worried that this might be cancer related.  I reassured her that it is not going to be cancer related and she tells me she is already scheduled to see Floyde Parkins regarding this.  Otherwise she will see me again in July.  She knows to call for any other problems that may develop before that visit.

## 2018-01-17 ENCOUNTER — Encounter: Payer: Self-pay | Admitting: Family

## 2018-01-17 ENCOUNTER — Ambulatory Visit (INDEPENDENT_AMBULATORY_CARE_PROVIDER_SITE_OTHER): Payer: 59 | Admitting: Family

## 2018-01-17 VITALS — BP 105/70 | HR 75 | Temp 97.7°F | Ht 60.0 in | Wt 125.0 lb

## 2018-01-17 DIAGNOSIS — M792 Neuralgia and neuritis, unspecified: Secondary | ICD-10-CM | POA: Diagnosis not present

## 2018-01-17 DIAGNOSIS — C50411 Malignant neoplasm of upper-outer quadrant of right female breast: Secondary | ICD-10-CM | POA: Diagnosis not present

## 2018-01-17 DIAGNOSIS — Z170 Estrogen receptor positive status [ER+]: Secondary | ICD-10-CM | POA: Diagnosis not present

## 2018-01-17 DIAGNOSIS — C50911 Malignant neoplasm of unspecified site of right female breast: Secondary | ICD-10-CM

## 2018-01-17 NOTE — Progress Notes (Signed)
Subjective:    Patient ID: Katelyn Shelton, female    DOB: 05-07-1976, 42 y.o.   MRN: [id number]   HPI Pt presents to the office today to establish care. Pt has right breast cancer and has received chemo and radiation. She is followed by Oncologists every 3 months. She is reporting left thigh numbness that has become worse since starting Arimidex. She has an appt with Neurologists, but needs a referral.   Denies any pain just constant numbness. PT has not taken anything for this.   Review of Systems  All other systems reviewed and are negative.   Family History  Problem Relation Age of Onset  . Skin cancer Mother   . Parkinson's disease Sister 63  . Diabetes Maternal Aunt   . Heart failure Maternal Grandmother   . Heart attack Maternal Grandfather   . Stomach cancer Paternal Grandmother   . Prostate cancer Paternal Grandfather   . Alzheimer's disease Paternal Grandfather   . Breast cancer Cousin        mother's paternal first FEMALE cousin    Social History   Socioeconomic History  . Marital status: Married    Spouse name: Not on file  . Number of children: Not on file  . Years of education: Not on file  . Highest education level: Not on file  Occupational History  . Not on file  Social Needs  . Financial resource strain: Not on file  . Food insecurity:    Worry: Not on file    Inability: Not on file  . Transportation needs:    Medical: Not on file    Non-medical: Not on file  Tobacco Use  . Smoking status: Never Smoker  . Smokeless tobacco: Never Used  Substance and Sexual Activity  . Alcohol use: No  . Drug use: No  . Sexual activity: Yes  Lifestyle  . Physical activity:    Days per week: Not on file    Minutes per session: Not on file  . Stress: Not on file  Relationships  . Social connections:    Talks on phone: Not on file    Gets together: Not on file    Attends religious service: Not on file    Active member of club or organization: Not on file   Attends meetings of clubs or organizations: Not on file    Relationship status: Not on file  Other Topics Concern  . Not on file  Social History Narrative  . Not on file       Objective:   Physical Exam  Constitutional: She is oriented to person, place, and time. She appears well-developed and well-nourished. No distress.  HENT:  Head: Normocephalic and atraumatic.  Right Ear: External ear normal.  Left Ear: External ear normal.  Nose: Nose normal.  Mouth/Throat: Oropharynx is clear and moist.  Eyes: Pupils are equal, round, and reactive to light.  Neck: Normal range of motion. Neck supple. No thyromegaly present.  Cardiovascular: Normal rate, regular rhythm, normal heart sounds and intact distal pulses.  No murmur heard. Pulmonary/Chest: Effort normal and breath sounds normal. No respiratory distress. She has no wheezes.  Abdominal: Soft. Bowel sounds are normal. She exhibits no distension. There is no tenderness.  Musculoskeletal: Normal range of motion. She exhibits no edema or tenderness.  Neurological: She is alert and oriented to person, place, and time.  Skin: Skin is warm and dry.  Psychiatric: Her behavior is normal. Judgment and thought content normal. Her mood  appears anxious.  Vitals reviewed.    BP 105/70   Pulse 75   Temp 97.7 F (36.5 C) (Oral)   Ht 5' (1.524 m)   Wt 125 lb (56.7 kg)   BMI 24.41 kg/m      Assessment & Plan:  1. 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 Malignant neoplasm of upper-outer quadrant of right breast in female, estrogen receptor positive ([place])  3. Invasive ductal carcinoma of breast, female, right [facility])   Keep all appts with Oncologists  Referral to Neurologists  Will get Pap faxed from [facility] and scan into chart RTO prn    Evelina Dun, FNP

## 2018-01-17 NOTE — Patient Instructions (Signed)
Neuropathic Pain Neuropathic pain is pain caused by damage to the nerves that are responsible for certain sensations in your body (sensory nerves). The pain can be caused by damage to:  The sensory nerves that send signals to your spinal cord and brain (peripheral nervous system).  The sensory nerves in your brain or spinal cord (central nervous system).  Neuropathic pain can make you more sensitive to pain. What would be a minor sensation for most people may feel very painful if you have neuropathic pain. This is usually a long-term condition that can be difficult to treat. The type of pain can differ from person to person. It may start suddenly (acute), or it may develop slowly and last for a long time (chronic). Neuropathic pain may come and go as damaged nerves heal or may stay at the same level for years. It often causes emotional distress, loss of sleep, and a lower quality of life. What are the causes? The most common cause of damage to a sensory nerve is diabetes. Many other diseases and conditions can also cause neuropathic pain. Causes of neuropathic pain can be classified as:  Toxic. Many drugs and chemicals can cause toxic damage. The most common cause of toxic neuropathic pain is damage from drug treatment for cancer (chemotherapy).  Metabolic. This type of pain can happen when a disease causes imbalances that damage nerves. Diabetes is the most common of these diseases. Vitamin B deficiency caused by long-term alcohol abuse is another common cause.  Traumatic. Any injury that cuts, crushes, or stretches a nerve can cause damage and pain. A common example is feeling pain after losing an arm or leg (phantom limb pain).  Compression-related. If a sensory nerve gets trapped or compressed for a long period of time, the blood supply to the nerve can be cut off.  Vascular. Many blood vessel diseases can cause neuropathic pain by decreasing blood supply and oxygen to nerves.  Autoimmune.  This type of pain results from diseases in which the body's defense system mistakenly attacks sensory nerves. Examples of autoimmune diseases that can cause neuropathic pain include lupus and multiple sclerosis.  Infectious. Many types of viral infections can damage sensory nerves and cause pain. Shingles infection is a common cause of this type of pain.  Inherited. Neuropathic pain can be a symptom of many diseases that are passed down through families (genetic).  What are the signs or symptoms? The main symptom is pain. Neuropathic pain is often described as:  Burning.  Shock-like.  Stinging.  Hot or cold.  Itching.  How is this diagnosed? No single test can diagnose neuropathic pain. Your health care provider will do a physical exam and ask you about your pain. You may use a pain scale to describe how bad your pain is. You may also have tests to see if you have a high sensitivity to pain and to help find the cause and location of any sensory nerve damage. These tests may include:  Imaging studies, such as: ? X-rays. ? CT scan. ? MRI.  Nerve conduction studies to test how well nerve signals travel through your sensory nerves (electrodiagnostic testing).  Stimulating your sensory nerves through electrodes on your skin and measuring the response in your spinal cord and brain (somatosensory evoked potentials).  How is this treated? Treatment for neuropathic pain may change over time. You may need to try different treatment options or a combination of treatments. Some options include:  Over-the-counter pain relievers.  Prescription medicines. Some medicines   used to treat other conditions may also help neuropathic pain. These include medicines to: ? Control seizures (anticonvulsants). ? Relieve depression (antidepressants).  Prescription-strength pain relievers (narcotics). These are usually used when other pain relievers do not help.  Transcutaneous nerve stimulation (TENS).  This uses electrical currents to block painful nerve signals. The treatment is painless.  Topical and local anesthetics. These are medicines that numb the nerves. They can be injected as a nerve block or applied to the skin.  Alternative treatments, such as: ? Acupuncture. ? Meditation. ? Massage. ? Physical therapy. ? Pain management programs. ? Counseling.  Follow these instructions at home:  Learn as much as you can about your condition.  Take medicines only as directed by your health care provider.  Work closely with all your health care providers to find what works best for you.  Have a good support system at home.  Consider joining a chronic pain support group. Contact a health care provider if:  Your pain treatments are not helping.  You are having side effects from your medicines.  You are struggling with fatigue, mood changes, depression, or anxiety. This information is not intended to replace advice given to you by your health care provider. Make sure you discuss any questions you have with your health care provider. Document Released: 07/08/2004 Document Revised: 04/30/2016 Document Reviewed: 03/21/2014 Elsevier Interactive Patient Education  2018 Elsevier Inc.  

## 2018-01-18 ENCOUNTER — Encounter: Payer: Self-pay | Admitting: Neurology

## 2018-01-18 ENCOUNTER — Other Ambulatory Visit: Payer: Self-pay

## 2018-01-18 ENCOUNTER — Ambulatory Visit: Payer: 59 | Admitting: Neurology

## 2018-01-18 VITALS — BP 109/67 | HR 73 | Ht 60.0 in | Wt 127.0 lb

## 2018-01-18 DIAGNOSIS — R202 Paresthesia of skin: Secondary | ICD-10-CM | POA: Diagnosis not present

## 2018-01-18 NOTE — Progress Notes (Signed)
Reason for visit: Leg numbness  Referring physician: Dr. Bradley Shelton is a 42 y.o. female  History of present illness:  Katelyn Shelton is a 42 year old right-handed white female with a history of breast cancer that was initially diagnosed in May 2018.  The patient underwent bilateral mastectomies and breast implants, and then underwent chemotherapy that involved docetaxel and cyclophosphamide.  The patient also received radiation therapy, this ended on 30 September 2017.  Around this time, the patient was placed on Crestor.  She believes that well prior to the diagnosis of breast cancer she had some slight numbness in the sole of foot on the left.  After onset of Crestor treatment she developed some numbness and tingling sensations on the left anterolateral thigh.  She also describes an unusual sensation on the left lower quadrant of the abdomen.  She denies any discomfort per se, she went off the Crestor and the numbness and tingling in the left anterolateral thigh seems to have improved but is still not back to baseline.  She denies any weakness of the extremities and there have been no changes in balance.  She denies any issues controlling the bowels or the bladder.  She denies any neck pain or low back pain or discomfort down the arms on either side.  She has had no numbness on the face.  She denies any headaches or dizziness.  She comes to this office for an evaluation.  Past Medical History:  Diagnosis Date  . Anxiety   . Breast cancer ([place]) 01/2017   right; genetic testing negative in 2018 with [facility] panel  . Depression   . History of MRSA infection    elbow    Past Surgical History:  Procedure Laterality Date  . BREAST RECONSTRUCTION WITH PLACEMENT OF TISSUE EXPANDER AND FLEX HD (ACELLULAR HYDRATED DERMIS) Bilateral 02/28/2017   Procedure: BILATERAL BREAST RECONSTRUCTION WITH PLACEMENT OF TISSUE EXPANDER AND ALLODERM;  Surgeon: Irene Limbo, MD;  Location: [facility];  Service: Plastics;  Laterality: Bilateral;  . CESAREAN SECTION     x 2 (3 total)  . CESAREAN SECTION  02/04/2012   Procedure: CESAREAN SECTION;  Surgeon: Cyril Mourning, MD;  Location: [facility];  Service: Gynecology;  Laterality: N/A;  Repeat Cesarean Section Delivery  Boy  @  [phone], Apgars 9/10  . DILATION AND CURETTAGE OF UTERUS    . EXCISION OF BREAST LESION Right 03/15/2017   Procedure: RIGHT NIPPLE AND AREOLA EXCISION;  Surgeon: Rolm Bookbinder, MD;  Location: [facility];  Service: General;  Laterality: Right;  . NIPPLE SPARING MASTECTOMY Bilateral 02/28/2017   Procedure: RIGHT NIPPLE SPARING MASTECTOMY; LEFT PROPHYLACTIC NIPPLE SPARING MASTECTOMY;  Surgeon: Rolm Bookbinder, MD;  Location: [facility];  Service: General;  Laterality: Bilateral;  . PECTUS EXCAVATUM REPAIR  1996  . SENTINEL NODE BIOPSY Right 02/28/2017   Procedure: RIGHT AXILLARY SENTINEL LYMPH  NODE BIOPSY;  Surgeon: Rolm Bookbinder, MD;  Location: [facility];  Service: General;  Laterality: Right;  . VULVAR LESION REMOVAL    . WISDOM TOOTH EXTRACTION      Family History  Problem Relation Age of Onset  . Skin cancer Mother   . Parkinson's disease Sister 50  . Diabetes Maternal Aunt   . Heart failure Maternal Grandmother   . Heart attack Maternal Grandfather   . Stomach cancer Paternal Grandmother   . Prostate cancer Paternal Grandfather   . Alzheimer's disease Paternal Grandfather   .  Breast cancer Cousin        mother's paternal first FEMALE cousin    Social history:  reports that she has never smoked. She has never used smokeless tobacco. She reports that she does not drink alcohol or use drugs.  Medications:  Prior to Admission medications   Medication Sig Start Date End Date Taking? Authorizing Provider  acetaminophen (TYLENOL) 325 MG tablet Take 650 mg by mouth every 6 (six) hours as needed.   Yes [provider]  anastrozole  (ARIMIDEX) 1 MG tablet Take 1 tablet (1 mg total) by mouth daily. 10/05/17  Yes Magrinat, Virgie Dad, MD  fluticasone (FLONASE) 50 MCG/ACT nasal spray Place 1 spray into both nostrils daily as needed. 02/03/17  Yes [provider]  GARCINIA CAMBOGIA-CHROMIUM PO Take by mouth.   Yes [provider]  Linoleic Acid Conjugated 1000 MG CAPS Take by mouth.   Yes [provider]  Pitavastatin Calcium 2 MG TABS Take 1 tablet (2 mg total) by mouth 2 (two) times a week. Take 1 tablet on Mondays and Thursdays 01/16/18  Yes Magrinat, Virgie Dad, MD  venlafaxine XR (EFFEXOR-XR) 75 MG 24 hr capsule Take 1 capsule (75 mg total) by mouth daily with breakfast. 04/06/17  Yes Magrinat, Virgie Dad, MD      Allergies  Allergen Reactions  . Peach Flavor Anaphylaxis  . Fentanyl Itching  . Penicillins Hives    AS A CHILD  . Sulfa Antibiotics Nausea And Vomiting  . Sulfasalazine Nausea And Vomiting and Other (See Comments)  . Latex Itching    ROS:  Out of a complete 14 system review of symptoms, the patient complains only of the following symptoms, and all other reviewed systems are negative.  Weight gain, fatigue Feeling hot Joint pain  Blood pressure 109/67, pulse 73, height 5' (1.524 m), weight 127 lb (57.6 kg).  Physical Exam  General: The patient is alert and cooperative at the time of the examination.  Eyes: Pupils are equal, round, and reactive to light. Discs are flat bilaterally.  Neck: The neck is supple, no carotid bruits are noted.  Respiratory: The respiratory examination is clear.  Cardiovascular: The cardiovascular examination reveals a regular rate and rhythm, no obvious murmurs or rubs are noted.  Skin: Extremities are without significant edema.  Neurologic Exam  Mental status: The patient is alert and oriented x 3 at the time of the examination. The patient has apparent normal recent and remote memory, with an apparently normal attention span and concentration  ability.  Cranial nerves: Facial symmetry is present. There is good sensation of the face to pinprick and soft touch bilaterally. The strength of the facial muscles and the muscles to head turning and shoulder shrug are normal bilaterally. Speech is well enunciated, no aphasia or dysarthria is noted. Extraocular movements are full. Visual fields are full. The tongue is midline, and the patient has symmetric elevation of the soft palate. No obvious hearing deficits are noted.  Motor: The motor testing reveals 5 over 5 strength of all 4 extremities. Good symmetric motor tone is noted throughout.  Sensory: Sensory testing is intact to pinprick, soft touch, vibration sensation, and position sense on all 4 extremities, with exception some slight decreased sensation to soft touch on the anterolateral left thigh. No evidence of extinction is noted.  Coordination: Cerebellar testing reveals good finger-nose-finger and heel-to-shin bilaterally.  Gait and station: Gait is normal. Tandem gait is normal. Romberg is negative. No drift is seen.  Reflexes: Deep  tendon reflexes are symmetric and normal bilaterally.  The ankle jerk reflexes are well-maintained bilaterally.  Toes are downgoing bilaterally.   Assessment/Plan:  1.  Left thigh and left foot numbness  The clinical examination today is completely normal.  The patient has numbness in the left thigh consistent with a mild left lateral femoral cutaneous neuropathy, the etiology of this is not clear but the patient reports that this started when the Crestor was initiated and got better when this was stopped.  I am not clear that the Crestor really was the causal factor with this.  The patient also reports some mild left foot numbness but also describes some unusual sensations in the lower left quadrant of the abdomen.  The patient is quite concerned given the diagnosis of cancer recently.  We will go ahead and check a CT of the pelvis area looking at the  course of the lateral femoral cutaneous nerve in the pelvic floor.  The patient claims she cannot have MRI secondary to metal implants.  She will contact our office if she believes there has been any change in her clinical condition.  Katelyn Alexanders MD 01/18/2018 1:13 PM  [facility] [address] [place], [place] [address]  Phone [phone] Fax [phone]

## 2018-01-19 ENCOUNTER — Encounter: Payer: Self-pay | Admitting: Gastroenterology

## 2018-01-23 ENCOUNTER — Ambulatory Visit (INDEPENDENT_AMBULATORY_CARE_PROVIDER_SITE_OTHER): Payer: 59 | Admitting: Licensed Clinical Social Worker

## 2018-01-23 DIAGNOSIS — F324 Major depressive disorder, single episode, in partial remission: Secondary | ICD-10-CM | POA: Diagnosis not present

## 2018-02-02 ENCOUNTER — Other Ambulatory Visit: Payer: Self-pay | Admitting: *Deleted

## 2018-02-03 ENCOUNTER — Inpatient Hospital Stay: Payer: 59

## 2018-02-03 ENCOUNTER — Inpatient Hospital Stay: Payer: 59 | Attending: Oncology

## 2018-02-03 VITALS — BP 108/60 | HR 55 | Temp 98.3°F

## 2018-02-03 DIAGNOSIS — Z170 Estrogen receptor positive status [ER+]: Secondary | ICD-10-CM | POA: Insufficient documentation

## 2018-02-03 DIAGNOSIS — C50411 Malignant neoplasm of upper-outer quadrant of right female breast: Secondary | ICD-10-CM | POA: Insufficient documentation

## 2018-02-03 DIAGNOSIS — Z9013 Acquired absence of bilateral breasts and nipples: Secondary | ICD-10-CM | POA: Insufficient documentation

## 2018-02-03 DIAGNOSIS — Z923 Personal history of irradiation: Secondary | ICD-10-CM | POA: Insufficient documentation

## 2018-02-03 DIAGNOSIS — Z79811 Long term (current) use of aromatase inhibitors: Secondary | ICD-10-CM | POA: Insufficient documentation

## 2018-02-03 DIAGNOSIS — Z9221 Personal history of antineoplastic chemotherapy: Secondary | ICD-10-CM | POA: Insufficient documentation

## 2018-02-03 DIAGNOSIS — M8580 Other specified disorders of bone density and structure, unspecified site: Secondary | ICD-10-CM | POA: Diagnosis not present

## 2018-02-03 MED ORDER — GOSERELIN ACETATE 3.6 MG ~~LOC~~ IMPL
DRUG_IMPLANT | SUBCUTANEOUS | Status: AC
  Filled 2018-02-03: qty 3.6

## 2018-02-03 MED ORDER — GOSERELIN ACETATE 3.6 MG ~~LOC~~ IMPL
3.6000 mg | DRUG_IMPLANT | Freq: Once | SUBCUTANEOUS | Status: AC
  Administered 2018-02-03: 3.6 mg via SUBCUTANEOUS

## 2018-02-03 NOTE — Patient Instructions (Signed)
Goserelin injection What is this medicine? GOSERELIN (GOE se rel in) is similar to a hormone found in the body. It lowers the amount of sex hormones that the body makes. Men will have lower testosterone levels and women will have lower estrogen levels while taking this medicine. In men, this medicine is used to treat prostate cancer; the injection is either given once per month or once every 12 weeks. A once per month injection (only) is used to treat women with endometriosis, dysfunctional uterine bleeding, or advanced breast cancer. This medicine may be used for other purposes; ask your health care provider or pharmacist if you have questions. COMMON BRAND NAME(S): Zoladex What should I tell my health care provider before I take this medicine? They need to know if you have any of these conditions (some only apply to women): -diabetes -heart disease or previous heart attack -high blood pressure -high cholesterol -kidney disease -osteoporosis or low bone density -problems passing urine -spinal cord injury -stroke -tobacco smoker -an unusual or allergic reaction to goserelin, hormone therapy, other medicines, foods, dyes, or preservatives -pregnant or trying to get pregnant -breast-feeding How should I use this medicine? This medicine is for injection under the skin. It is given by a health care professional in a hospital or clinic setting. Men receive this injection once every 4 weeks or once every 12 weeks. Women will only receive the once every 4 weeks injection. Talk to your pediatrician regarding the use of this medicine in children. Special care may be needed. Overdosage: If you think you have taken too much of this medicine contact a poison control center or emergency room at once. NOTE: This medicine is only for you. Do not share this medicine with others. What if I miss a dose? It is important not to miss your dose. Call your doctor or health care professional if you are unable to  keep an appointment. What may interact with this medicine? -female hormones like estrogen -herbal or dietary supplements like black cohosh, chasteberry, or DHEA -female hormones like testosterone -prasterone This list may not describe all possible interactions. Give your health care provider a list of all the medicines, herbs, non-prescription drugs, or dietary supplements you use. Also tell them if you smoke, drink alcohol, or use illegal drugs. Some items may interact with your medicine. What should I watch for while using this medicine? Visit your doctor or health care professional for regular checks on your progress. Your symptoms may appear to get worse during the first weeks of this therapy. Tell your doctor or healthcare professional if your symptoms do not start to get better or if they get worse after this time. Your bones may get weaker if you take this medicine for a long time. If you smoke or frequently drink alcohol you may increase your risk of bone loss. A family history of osteoporosis, chronic use of drugs for seizures (convulsions), or corticosteroids can also increase your risk of bone loss. Talk to your doctor about how to keep your bones strong. This medicine should stop regular monthly menstration in women. Tell your doctor if you continue to menstrate. Women should not become pregnant while taking this medicine or for 12 weeks after stopping this medicine. Women should inform their doctor if they wish to become pregnant or think they might be pregnant. There is a potential for serious side effects to an unborn child. Talk to your health care professional or pharmacist for more information. Do not breast-feed an infant while taking   this medicine. Men should inform their doctors if they wish to father a child. This medicine may lower sperm counts. Talk to your health care professional or pharmacist for more information. What side effects may I notice from receiving this  medicine? Side effects that you should report to your doctor or health care professional as soon as possible: -allergic reactions like skin rash, itching or hives, swelling of the face, lips, or tongue -bone pain -breathing problems -changes in vision -chest pain -feeling faint or lightheaded, falls -fever, chills -pain, swelling, warmth in the leg -pain, tingling, numbness in the hands or feet -signs and symptoms of low blood pressure like dizziness; feeling faint or lightheaded, falls; unusually weak or tired -stomach pain -swelling of the ankles, feet, hands -trouble passing urine or change in the amount of urine -unusually high or low blood pressure -unusually weak or tired Side effects that usually do not require medical attention (report to your doctor or health care professional if they continue or are bothersome): -change in sex drive or performance -changes in breast size in both males and females -changes in emotions or moods -headache -hot flashes -irritation at site where injected -loss of appetite -skin problems like acne, dry skin -vaginal dryness This list may not describe all possible side effects. Call your doctor for medical advice about side effects. You may report side effects to FDA at 1-800-FDA-1088. Where should I keep my medicine? This drug is given in a hospital or clinic and will not be stored at home. NOTE: This sheet is a summary. It may not cover all possible information. If you have questions about this medicine, talk to your doctor, pharmacist, or health care provider.  2018 Elsevier/Gold Standard (2013-12-18 11:10:35)  

## 2018-02-06 ENCOUNTER — Telehealth: Payer: Self-pay | Admitting: Neurology

## 2018-02-06 DIAGNOSIS — R202 Paresthesia of skin: Secondary | ICD-10-CM

## 2018-02-06 NOTE — Telephone Encounter (Signed)
CT pelvis without contrast was ordered.

## 2018-02-06 NOTE — Telephone Encounter (Signed)
When you get a chance can you switch the CT Pelvis to wo contrast and not have the limited in there.

## 2018-02-06 NOTE — Telephone Encounter (Signed)
They will reach out to the pt to schedule.

## 2018-02-06 NOTE — Telephone Encounter (Signed)
Put order in for CT PELVIS WO as requested.

## 2018-02-06 NOTE — Telephone Encounter (Signed)
Order was sent to GI.

## 2018-02-06 NOTE — Telephone Encounter (Signed)
Patient calling regarding scheduling CT scan.

## 2018-02-20 ENCOUNTER — Other Ambulatory Visit: Payer: Self-pay | Admitting: Neurology

## 2018-02-20 DIAGNOSIS — R102 Pelvic and perineal pain: Secondary | ICD-10-CM

## 2018-02-20 MED FILL — VENLAFAXINE HCL ER 75 MG CA: 75 | 60 days supply | Qty: 60 | Fill #3

## 2018-02-20 NOTE — Telephone Encounter (Signed)
This is a Dr. Jannifer Franklin patient.   Katelyn Shelton with [facility] called stating that   " per Wells Guiles this needs be a MRI  Pelvis not a CT due to dr wants to eval nerves and ct does not look at nerves-s"   She stated if he wants her to have a CT of the pelvic to have the order say pelvic pain for the diagnosis code and not paresthesia. Or change the order to a MRI.   The patient is schedule right now to have the CT done tomorrow.

## 2018-02-20 NOTE — Telephone Encounter (Signed)
I have corrected the order CT pelvis without contrast and put diagnosis code as pelvic pain.

## 2018-02-20 NOTE — Telephone Encounter (Signed)
Noted, thank you

## 2018-02-20 NOTE — Telephone Encounter (Signed)
Dr. Brett Fairy- Dr. Jannifer Franklin not here today. You are the WID this am. Are you okay w/ switching order?

## 2018-02-21 ENCOUNTER — Inpatient Hospital Stay: Admission: RE | Admit: 2018-02-21 | Payer: Self-pay | Source: Ambulatory Visit

## 2018-02-21 ENCOUNTER — Other Ambulatory Visit: Payer: Self-pay

## 2018-02-21 ENCOUNTER — Ambulatory Visit
Admission: RE | Admit: 2018-02-21 | Discharge: 2018-02-21 | Disposition: A | Discharge status: Home / Self Care | Payer: 59 | Source: Ambulatory Visit | Attending: Neurology | Admitting: Neurology

## 2018-02-21 DIAGNOSIS — R202 Paresthesia of skin: Secondary | ICD-10-CM

## 2018-02-21 DIAGNOSIS — R200 Anesthesia of skin: Secondary | ICD-10-CM | POA: Diagnosis not present

## 2018-02-22 ENCOUNTER — Telehealth: Payer: Self-pay | Admitting: Neurology

## 2018-02-22 NOTE — Telephone Encounter (Signed)
  I called the patient.  The CT of the pelvis shows chronic changes in the bone that were present in 2015, unlikely to be related to cancer.  Nothing that would explain the numbness of the leg on the left.  The patient will contact me if she believes that the sensory alteration is altering.  CT pelvis 02/22/18:  IMPRESSION: 1. A cause for the patient's symptoms is not identified. 2. Sclerotic lesions in the right iliac bone and right acetabulum are stable from 2015 and accordingly unlikely to represent active malignancy. 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 3. Bilateral common iliac artery atherosclerotic calcification.

## 2018-02-24 ENCOUNTER — Encounter: Payer: Self-pay | Admitting: Family Medicine

## 2018-02-24 ENCOUNTER — Ambulatory Visit: Payer: 59 | Admitting: Family Medicine

## 2018-02-24 VITALS — BP 102/76 | HR 83 | Temp 98.3°F | Ht 60.0 in | Wt 127.8 lb

## 2018-02-24 DIAGNOSIS — D169 Benign neoplasm of bone and articular cartilage, unspecified: Secondary | ICD-10-CM | POA: Diagnosis not present

## 2018-02-24 DIAGNOSIS — F419 Anxiety disorder, unspecified: Secondary | ICD-10-CM | POA: Diagnosis not present

## 2018-02-24 DIAGNOSIS — E785 Hyperlipidemia, unspecified: Secondary | ICD-10-CM

## 2018-02-24 DIAGNOSIS — Z800 Family history of malignant neoplasm of digestive organs: Secondary | ICD-10-CM

## 2018-02-24 DIAGNOSIS — E7849 Other hyperlipidemia: Secondary | ICD-10-CM | POA: Diagnosis not present

## 2018-02-24 DIAGNOSIS — C50411 Malignant neoplasm of upper-outer quadrant of right female breast: Secondary | ICD-10-CM | POA: Diagnosis not present

## 2018-02-24 DIAGNOSIS — Z8371 Family history of colonic polyps: Secondary | ICD-10-CM

## 2018-02-24 DIAGNOSIS — M2550 Pain in unspecified joint: Secondary | ICD-10-CM

## 2018-02-24 DIAGNOSIS — F411 Generalized anxiety disorder: Secondary | ICD-10-CM | POA: Insufficient documentation

## 2018-02-24 DIAGNOSIS — Z83719 Family history of colon polyps, unspecified: Secondary | ICD-10-CM | POA: Insufficient documentation

## 2018-02-24 DIAGNOSIS — Z170 Estrogen receptor positive status [ER+]: Secondary | ICD-10-CM | POA: Diagnosis not present

## 2018-02-24 NOTE — Assessment & Plan Note (Signed)
Patient feels like she was perfectly healthy prior to the development of breast cancer and need for associated treatments Her Arimidex was started on October 25, 2017.  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 She has spacers/temporary implants in place now.

## 2018-02-24 NOTE — Assessment & Plan Note (Signed)
We did not make Any changes in current plan but patient needed extended time to talk through her concerns and what is she is going through.  This has been a very difficult time for her over the last 2 years- from cancer, to treatments, to side effects, to still raising 3 kids and supporting her husband.  I supported her as able today.   She denies depression but admits to anxiety.  I think continuing on venlafaxine 75 mg is a good idea-I even wonder about increasing medication

## 2018-02-24 NOTE — Assessment & Plan Note (Signed)
Sister at 35 had precancerous polyp- she has planned colonoscopy may 2019

## 2018-02-24 NOTE — Assessment & Plan Note (Addendum)
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 Without apoe gene would not use statin with 10 year risk of heart attack or stroke at 0.6%. Rosuvastatin 5 mg= intense arthralgias as myalgias. Trial livalo at present.  Poorly controlled right now on no statin- see #s below Lab Results  Component Value Date   CHOL 230 (H) 01/12/2018   HDL 50 01/12/2018   LDLCALC 155 (H) 01/12/2018   TRIG 125 01/12/2018   CHOLHDL 4.6 01/12/2018   A/P: she will return for fasting lipids next week. Need to clarify if LDL above 100 but per notes only on livalo twice a week.   Spoke with her father who is an MD as well and he requested cmp, CK, sed rate. Sed rate due to achiness for "baseline". I was clear that I did not think this would change management. Would likely only stop statin if significant CK elevation.

## 2018-02-24 NOTE — Progress Notes (Signed)
Phone: [phone]  Subjective:  Patient presents today to establish care.  Prior patient of primarily gynecology Dr. Corinna Capra. Chief complaint-noted.   See problem oriented charting  The following were reviewed and entered/updated in epic: Past Medical History:  Diagnosis Date  . Anxiety    venflafaxine XR 75 mg  . Breast cancer ([place]) 01/2017   right; genetic testing negative in 2018 with [facility] panel  . Heart murmur    as child- went away  . History of MRSA infection    elbow  . Migraine    when on birth control pills  . Miscarriage    x1  . UTI (urinary tract infection)    Patient Active Problem List   Diagnosis Date Noted  . Invasive ductal carcinoma of breast, female, right ([place]) 03/31/2017    Priority: High  . Malignant neoplasm of upper-outer quadrant of right breast in female, estrogen receptor positive ([place]) 02/11/2017    Priority: High  . Arthralgia 02/24/2018    Priority: Medium  . Enchondroma of bone 02/24/2018    Priority: Medium  . Anxiety     Priority: Medium  . Hidradenitis suppurativa 08/26/2011    Priority: Medium  . Hyperlipidemia, familial, high LDL 01/16/2010    Priority: Medium  . Family history of colonic polyps 02/24/2018    Priority: Low  . Genetic testing 02/21/2017    Priority: Low  . Family history of stomach cancer     Priority: Low   Past Surgical History:  Procedure Laterality Date  . BREAST RECONSTRUCTION WITH PLACEMENT OF TISSUE EXPANDER AND FLEX HD (ACELLULAR HYDRATED DERMIS) Bilateral 02/28/2017   Procedure: BILATERAL BREAST RECONSTRUCTION WITH PLACEMENT OF TISSUE EXPANDER AND ALLODERM;  Surgeon: Irene Limbo, MD;  Location: [facility];  Service: Plastics;  Laterality: Bilateral;  . CESAREAN SECTION     x 2 (3 total)  . CESAREAN SECTION  02/04/2012   Procedure: CESAREAN SECTION;  Surgeon: Cyril Mourning, MD;  Location: [facility];  Service: Gynecology;  Laterality: N/A;  Repeat Cesarean Section Delivery  Boy  @   [phone], Apgars 9/10  . DILATION AND CURETTAGE OF UTERUS    . EXCISION OF BREAST LESION Right 03/15/2017   Procedure: RIGHT NIPPLE AND AREOLA EXCISION;  Surgeon: Rolm Bookbinder, MD;  Location: [facility];  Service: General;  Laterality: Right;  . NIPPLE SPARING MASTECTOMY Bilateral 02/28/2017   Procedure: RIGHT NIPPLE SPARING MASTECTOMY; LEFT PROPHYLACTIC NIPPLE SPARING MASTECTOMY;  Surgeon: Rolm Bookbinder, MD;  Location: [facility];  Service: General;  Laterality: Bilateral;  . PECTUS EXCAVATUM REPAIR  [id number]   42 years old- very large implant  . SENTINEL NODE BIOPSY Right 02/28/2017   Procedure: RIGHT AXILLARY SENTINEL LYMPH  NODE BIOPSY;  Surgeon: Rolm Bookbinder, MD;  Location: [facility];  Service: General;  Laterality: Right;  . VULVAR LESION REMOVAL    . WISDOM TOOTH EXTRACTION      Family History  Problem Relation Age of Onset  . Skin cancer Mother   . Hyperlipidemia Mother   . Hypertension Mother   . Hyperlipidemia Father   . Parkinson's disease Sister 42       early on set  . Miscarriages / Stillbirths Sister   . Diabetes Maternal Aunt   . Heart failure Maternal Grandmother   . Stroke Maternal Grandmother   . Heart attack Maternal Grandfather   . Depression Maternal Grandfather   . Early death Maternal Grandfather  73 MI  . Stomach cancer Paternal Grandmother   . Prostate cancer Paternal Grandfather   . Alzheimer's disease Paternal Grandfather   . Breast cancer Cousin        mother's paternal first FEMALE cousin    Medications- reviewed and updated Current Outpatient Medications  Medication Sig Dispense Refill  . acetaminophen (TYLENOL) 325 MG tablet Take 650 mg by mouth every 6 (six) hours as needed.    [name] anastrozole (ARIMIDEX) 1 MG tablet Take 1 tablet (1 mg total) by mouth daily. 90 tablet 4  . denosumab (PROLIA) 60 MG/ML SOSY injection Inject 60 mg into the skin every 6 (six) months.    . fluticasone (FLONASE)  50 MCG/ACT nasal spray Place 1 spray into both nostrils daily as needed.    [name] GARCINIA CAMBOGIA-CHROMIUM PO Take by mouth.    . Goserelin Acetate (ZOLADEX [place]) Inject 1 Dose into the skin every 30 (thirty) days.    . Linoleic Acid Conjugated 1000 MG CAPS Take by mouth.    . Pitavastatin Calcium 2 MG TABS Take 1 tablet (2 mg total) by mouth 2 (two) times a week. Take 1 tablet on Mondays and Thursdays 30 tablet 6  . venlafaxine XR (EFFEXOR-XR) 75 MG 24 hr capsule Take 1 capsule (75 mg total) by mouth daily with breakfast. 90 capsule 3   No current facility-administered medications for this visit.     Allergies-reviewed and updated Allergies  Allergen Reactions  . Peach Flavor Anaphylaxis  . Fentanyl Itching  . Penicillins Hives    AS A CHILD  . Sulfa Antibiotics Nausea And Vomiting  . Sulfasalazine Nausea And Vomiting and Other (See Comments)  . Latex Itching    Social History   Social History Narrative   Married to Dr. Aundra Dubin. 3 children.    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    Masters in public history at [employer]. Undergrad at [employer]: running, reading, exercise    ROS--Full ROS was completed Review of Systems  Constitutional: Negative for chills and fever.  HENT: Negative for ear discharge and nosebleeds.   Eyes: Negative for blurred vision and double vision.  Respiratory: Negative for hemoptysis and shortness of breath.   Cardiovascular: Negative for chest pain.  Gastrointestinal: Negative for abdominal pain and vomiting.  Genitourinary: Negative for dysuria and urgency.  Musculoskeletal: Positive for joint pain and myalgias.  Skin: Negative for itching and rash.  Neurological: Positive for tingling. Negative for dizziness.  Endo/Heme/Allergies: Negative for polydipsia. Does not bruise/bleed easily.  Psychiatric/Behavioral: Negative for hallucinations and substance abuse.   Objective: BP 102/76 (BP Location: Left  Arm, Patient Position: Sitting, Cuff Size: Normal)   Pulse 83   Temp 98.3 F (36.8 C) (Oral)   Ht 5' (1.524 m)   Wt 127 lb 12.8 oz (58 kg)   SpO2 95%   BMI 24.96 kg/m  Gen: NAD, resting comfortably, very kind HEENT: Mucous membranes are moist. Oropharynx normal. TM normal. Eyes: sclera and lids normal, PERRLA Neck: no thyromegaly, no cervical lymphadenopathy CV: RRR no murmurs rubs or gallops Lungs: CTAB no crackles, wheeze, rhonchi Abdomen: soft/nontender/nondistended/normal bowel sounds. No rebound or guarding.  Ext: no edema Skin: warm, dry Neuro: 5/5 strength in upper and lower extremities, normal gait, normal reflexes MSK-no joint swelling.  Some tenderness at joints described below Psych-appears anxious when discussing changes in her health  Assessment/Plan:  Other notes: 1.  April 2018 pap smear.   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 Without apoe gene would not use statin with 10 year risk of heart attack or stroke at 0.6%. Rosuvastatin 5 mg= intense arthralgias as myalgias. Trial livalo at present.  Poorly controlled right now on no statin- see #s below Lab Results  Component Value Date   CHOL 230 (H) 01/12/2018   HDL 50 01/12/2018   LDLCALC 155 (H) 01/12/2018   TRIG 125 01/12/2018   CHOLHDL 4.6 01/12/2018   A/P: she will return for fasting lipids next week. Need to clarify if LDL above 100 but per notes only on livalo twice a week.   Spoke with her father who is an MD as well and he requested cmp, CK, sed rate. Sed rate due to achiness for "baseline". I was clear that I did not think this would change management. Would likely only stop statin if significant CK elevation.     Family history of colonic polyps Sister at 48 had precancerous polyp- she has planned colonoscopy may 2019  Malignant neoplasm of upper-outer quadrant of right breast in female, estrogen receptor positive [facility]) Patient feels like  she was perfectly healthy prior to the development of breast cancer and need for associated treatments Her Arimidex was started on October 25, 2017.  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 She has spacers/temporary implants in place now.    Anxiety We did not make Any changes in current plan but patient needed extended time to talk through her concerns and what is she is going through.  This has been a very difficult time for her over the last 2 years- from cancer, to treatments, to side effects, to still raising 3 kids and supporting her husband.  I supported her as able today.   She denies depression but admits to anxiety.  I think continuing on venlafaxine 75 mg is a good idea-I even wonder about increasing medication  Arthralgia S: Patient states before her treatments for breast cancer and hyperlipidemia that she had no arthralgias or myalgias.  She states she started with Soreness in joints on aromatase inhibitor/arimedex but also states had similar phenomenon on Crestor.  She is not on Crestor at this point but continues to have issues as stated below which once again she attributes to the  Arimidex.   She complains of morning stiffness.  The more she moves the better she feels.  In fact when she moves the morning stiffness resolves within just a few minutes.  She denies any prolonged morning stiffness.  she feels like she has to exercise daily.  She complains of issues with the  following joints CMC, MCP and DIP both thumbs DIP and PIP joints of fingers 2-5.  Lateral hips and down from there, inner thighs (both areas here appear to be more myalgias) Diffuse foot pain (cannot point out areas of pain)  She also has some left lateral thigh numbness which has been worked up by neurology  who described lateral who describes lateral femoral cutaneous nerve impingement/neuropathy.  Apparently this did get better when she stopped Crestor.  She ended up having a CT scan of her pelvis which showed possible enchondroma.   A/P:   Enchondroma of bone  She is worried the enchondroma may increase her risk of fracture.  At the time of visit I was unsure of this.  After visit discovered there is some increased fracture risk.  This still would not change management. We will call her to update her.    Future Appointments  Date Time Provider [place]  02/28/2018  8:30 AM [facility] PREVISIT RM50 [facility]  03/03/2018  8:00 AM [facility] FLUSH NURSE 2 [facility] None  03/09/2018  2:00 PM Nandigam, Karleen Hampshire V, MD [facility]  03/13/2018  8:30 AM [facility] LAB [facility]EC  03/31/2018  8:00 AM [facility] INJ NURSE [facility] None  05/01/2018  9:30 AM Magrinat, Virgie Dad, MD [facility] None  05/01/2018 10:00 AM [facility] INJ NURSE [facility] None   We did not schedule planned follow up.    Lab/Order associations: Hyperlipidemia, unspecified hyperlipidemia type - Plan: Lipid panel, Comprehensive metabolic panel, CK (Creatine Kinase), Sedimentation rate,   Arthralgia, unspecified joint - Plan: CK (Creatine Kinase), Sedimentation rate  Time Stamp The duration of face-to-face time during this visit was greater than 45 minutes (10:40-11:33). Greater than 50% of this time was spent in counseling, explanation of diagnosis, planning of further management, and/or coordination of care including counseling patient through the difficult strong of health care experiences she has  had, consulting her father on phone who is an MD.    Return precautions advised. Garret Reddish, MD

## 2018-02-24 NOTE — Patient Instructions (Addendum)
Schedule a lab visit at the check out desk within 2 weeks. Return for future fasting labs meaning nothing but water after midnight please. Ok to take your medications with water.   No changes in medication

## 2018-02-24 NOTE — Assessment & Plan Note (Signed)
S: Patient states before her treatments for breast cancer and hyperlipidemia that she had no arthralgias or myalgias.  She states she started with Soreness in joints on aromatase inhibitor/arimedex but also states had similar phenomenon on Crestor.  She is not on Crestor at this point but continues to have issues as stated below which once again she attributes to the  Arimidex.   She complains of morning stiffness.  The more she moves the better she feels.  In fact when she moves the morning stiffness resolves within just a few minutes.  She denies any prolonged morning stiffness.  she feels like she has to exercise daily.  She complains of issues with the following joints CMC, MCP and DIP both thumbs DIP and PIP joints of fingers 2-5.  Lateral hips and down from there, inner thighs (both areas here appear to be more myalgias) Diffuse foot pain (cannot point out areas of pain)  She also has some left lateral thigh numbness which has been worked up by neurology who described lateral who describes lateral femoral cutaneous nerve impingement/neuropathy.  Apparently this did get better when she stopped Crestor.  She ended up having a CT scan of her pelvis which showed possible enchondroma.   A/P:

## 2018-02-24 NOTE — Assessment & Plan Note (Signed)
She is worried the enchondroma may increase her risk of fracture.  At the time of visit I was unsure of this.  After visit discovered there is some increased fracture risk.  This still would not change management. We will call her to update her.

## 2018-02-28 ENCOUNTER — Other Ambulatory Visit: Payer: Self-pay

## 2018-02-28 ENCOUNTER — Telehealth: Payer: Self-pay

## 2018-02-28 ENCOUNTER — Encounter: Payer: Self-pay | Admitting: Gastroenterology

## 2018-02-28 ENCOUNTER — Ambulatory Visit (AMBULATORY_SURGERY_CENTER): Payer: Self-pay | Admitting: *Deleted

## 2018-02-28 VITALS — Ht 60.0 in | Wt 131.0 lb

## 2018-02-28 DIAGNOSIS — Z8371 Family history of colonic polyps: Secondary | ICD-10-CM

## 2018-02-28 DIAGNOSIS — Z83719 Family history of colon polyps, unspecified: Secondary | ICD-10-CM

## 2018-02-28 MED ORDER — NA SULFATE-K SULFATE-MG SULF 17.5-3.13-1.6 GM/177ML PO SOLN: 1.0000 | Freq: Once | ORAL | 0 refills | Status: AC

## 2018-02-28 MED FILL — SUPREP BOWEL PREP KIT: 17.5-3.13-1 | 1 days supply | Qty: 354 | Fill #0

## 2018-02-28 NOTE — Telephone Encounter (Signed)
-----   Message from Marin Olp, MD sent at 02/24/2018  9:16 PM EDT ----- On further reasearch- does appear enchondroma's do increase fracture risk. Typically there is no surgical intervention before fractures though. If she would like to speak to an orthopedic surgeon for their opinion we can certainly refer her. Monitoring is also reasonable.

## 2018-02-28 NOTE — Progress Notes (Signed)
No egg or soy allergy known to patient  No issues with past sedation with any surgeries  or procedures, no intubation problems  No diet pills per patient No home 02 use per patient  No blood thinners per patient  Pt denies issues with constipation  No A fib or A flutter  EMMI video sent to pt's e mail - pt declined [facility] and [facility] sent as pt asked it to be sent to [facility] out pt pharmacy

## 2018-02-28 NOTE — Telephone Encounter (Signed)
Called and spoke with patient who states she does not want a referral at this time. She will just monitor.

## 2018-03-03 ENCOUNTER — Inpatient Hospital Stay: Payer: 59 | Attending: Oncology

## 2018-03-03 DIAGNOSIS — C50411 Malignant neoplasm of upper-outer quadrant of right female breast: Secondary | ICD-10-CM | POA: Diagnosis not present

## 2018-03-03 DIAGNOSIS — Z9221 Personal history of antineoplastic chemotherapy: Secondary | ICD-10-CM | POA: Insufficient documentation

## 2018-03-03 DIAGNOSIS — Z9013 Acquired absence of bilateral breasts and nipples: Secondary | ICD-10-CM | POA: Insufficient documentation

## 2018-03-03 DIAGNOSIS — Z170 Estrogen receptor positive status [ER+]: Secondary | ICD-10-CM

## 2018-03-03 DIAGNOSIS — Z923 Personal history of irradiation: Secondary | ICD-10-CM | POA: Diagnosis not present

## 2018-03-03 DIAGNOSIS — Z79811 Long term (current) use of aromatase inhibitors: Secondary | ICD-10-CM | POA: Diagnosis not present

## 2018-03-03 MED ORDER — GOSERELIN ACETATE 3.6 MG ~~LOC~~ IMPL
DRUG_IMPLANT | SUBCUTANEOUS | Status: AC
Start: 2018-03-03 — End: ?
  Filled 2018-03-03: qty 3.6

## 2018-03-03 MED ORDER — GOSERELIN ACETATE 3.6 MG ~~LOC~~ IMPL
3.6000 mg | DRUG_IMPLANT | Freq: Once | SUBCUTANEOUS | Status: AC
  Administered 2018-03-03: 3.6 mg via SUBCUTANEOUS

## 2018-03-03 NOTE — Patient Instructions (Signed)
Goserelin injection (Zoladex) What is this medicine? GOSERELIN (GOE se rel in) is similar to a hormone found in the body. It lowers the amount of sex hormones that the body makes. Men will have lower testosterone levels and women will have lower estrogen levels while taking this medicine. In men, this medicine is used to treat prostate cancer; the injection is either given once per month or once every 12 weeks. A once per month injection (only) is used to treat women with endometriosis, dysfunctional uterine bleeding, or advanced breast cancer. This medicine may be used for other purposes; ask your health care provider or pharmacist if you have questions. COMMON BRAND NAME(S): Zoladex What should I tell my health care provider before I take this medicine? They need to know if you have any of these conditions (some only apply to women): -diabetes -heart disease or previous heart attack -high blood pressure -high cholesterol -kidney disease -osteoporosis or low bone density -problems passing urine -spinal cord injury -stroke -tobacco smoker -an unusual or allergic reaction to goserelin, hormone therapy, other medicines, foods, dyes, or preservatives -pregnant or trying to get pregnant -breast-feeding How should I use this medicine? This medicine is for injection under the skin. It is given by a health care professional in a hospital or clinic setting. Men receive this injection once every 4 weeks or once every 12 weeks. Women will only receive the once every 4 weeks injection. Talk to your pediatrician regarding the use of this medicine in children. Special care may be needed. Overdosage: If you think you have taken too much of this medicine contact a poison control center or emergency room at once. NOTE: This medicine is only for you. Do not share this medicine with others. What if I miss a dose? It is important not to miss your dose. Call your doctor or health care professional if you are  unable to keep an appointment. What may interact with this medicine? -female hormones like estrogen -herbal or dietary supplements like black cohosh, chasteberry, or DHEA -female hormones like testosterone -prasterone This list may not describe all possible interactions. Give your health care provider a list of all the medicines, herbs, non-prescription drugs, or dietary supplements you use. Also tell them if you smoke, drink alcohol, or use illegal drugs. Some items may interact with your medicine. What should I watch for while using this medicine? Visit your doctor or health care professional for regular checks on your progress. Your symptoms may appear to get worse during the first weeks of this therapy. Tell your doctor or healthcare professional if your symptoms do not start to get better or if they get worse after this time. Your bones may get weaker if you take this medicine for a long time. If you smoke or frequently drink alcohol you may increase your risk of bone loss. A family history of osteoporosis, chronic use of drugs for seizures (convulsions), or corticosteroids can also increase your risk of bone loss. Talk to your doctor about how to keep your bones strong. This medicine should stop regular monthly menstration in women. Tell your doctor if you continue to [facility]. Women should not become pregnant while taking this medicine or for 12 weeks after stopping this medicine. Women should inform their doctor if they wish to become pregnant or think they might be pregnant. There is a potential for serious side effects to an unborn child. Talk to your health care professional or pharmacist for more information. Do not breast-feed an infant while  taking this medicine. Men should inform their doctors if they wish to father a child. This medicine may lower sperm counts. Talk to your health care professional or pharmacist for more information. What side effects may I notice from receiving this  medicine? Side effects that you should report to your doctor or health care professional as soon as possible: -allergic reactions like skin rash, itching or hives, swelling of the face, lips, or tongue -bone pain -breathing problems -changes in vision -chest pain -feeling faint or lightheaded, falls -fever, chills -pain, swelling, warmth in the leg -pain, tingling, numbness in the hands or feet -signs and symptoms of low blood pressure like dizziness; feeling faint or lightheaded, falls; unusually weak or tired -stomach pain -swelling of the ankles, feet, hands -trouble passing urine or change in the amount of urine -unusually high or low blood pressure -unusually weak or tired Side effects that usually do not require medical attention (report to your doctor or health care professional if they continue or are bothersome): -change in sex drive or performance -changes in breast size in both males and females -changes in emotions or moods -headache -hot flashes -irritation at site where injected -loss of appetite -skin problems like acne, dry skin -vaginal dryness This list may not describe all possible side effects. Call your doctor for medical advice about side effects. You may report side effects to FDA at 1-800-FDA-1088. Where should I keep my medicine? This drug is given in a hospital or clinic and will not be stored at home. NOTE: This sheet is a summary. It may not cover all possible information. If you have questions about this medicine, talk to your doctor, pharmacist, or health care provider.  2018 Elsevier/Gold Standard (2013-12-18 11:10:35)

## 2018-03-09 ENCOUNTER — Ambulatory Visit (AMBULATORY_SURGERY_CENTER): Payer: 59 | Admitting: Gastroenterology

## 2018-03-09 ENCOUNTER — Encounter: Payer: Self-pay | Admitting: Gastroenterology

## 2018-03-09 VITALS — BP 147/71 | HR 66 | Temp 99.1°F | Resp 13 | Ht 60.0 in | Wt 131.0 lb

## 2018-03-09 DIAGNOSIS — Z1211 Encounter for screening for malignant neoplasm of colon: Secondary | ICD-10-CM

## 2018-03-09 DIAGNOSIS — K635 Polyp of colon: Secondary | ICD-10-CM

## 2018-03-09 DIAGNOSIS — D123 Benign neoplasm of transverse colon: Secondary | ICD-10-CM

## 2018-03-09 DIAGNOSIS — Z8371 Family history of colonic polyps: Secondary | ICD-10-CM

## 2018-03-09 DIAGNOSIS — Z83719 Family history of colon polyps, unspecified: Secondary | ICD-10-CM

## 2018-03-09 DIAGNOSIS — K5140 Inflammatory polyps of colon without complications: Secondary | ICD-10-CM | POA: Diagnosis not present

## 2018-03-09 DIAGNOSIS — F419 Anxiety disorder, unspecified: Secondary | ICD-10-CM | POA: Diagnosis not present

## 2018-03-09 MED ORDER — SODIUM CHLORIDE 0.9 % IV SOLN: 500.0000 mL | Freq: Once | INTRAVENOUS | Status: DC

## 2018-03-09 NOTE — Op Note (Signed)
[place] Patient Name: Katelyn Shelton Procedure Date: 03/09/2018 1:55 PM MRN: [id number] Endoscopist: Mauri Pole , MD Age: 42 Referring MD:  Date of Birth: November 16, 1975 Gender: Female Account #: [id number] Procedure:                Colonoscopy Indications:              Colon cancer screening in patient at increased                            risk: Family history of colon polyps in multiple                            1st-degree relatives (Mother and sister in 47's)                            and in multiple secondary degree relatives                            (Maternal aunt and uncle in 65's) Medicines:                Monitored Anesthesia Care Procedure:                Pre-Anesthesia Assessment:                           - Prior to the procedure, a History and Physical                            was performed, and patient medications and                            allergies were reviewed. The patient's tolerance of                            previous anesthesia was also reviewed. The risks                            and benefits of the procedure and the sedation                            options and risks were discussed with the patient.                            All questions were answered, and informed consent                            was obtained. Prior Anticoagulants: The patient has                            taken no previous anticoagulant or antiplatelet                            agents. ASA Grade Assessment: II - A patient with  mild systemic disease. After reviewing the risks                            and benefits, the patient was deemed in                            satisfactory condition to undergo the procedure.                           After obtaining informed consent, the colonoscope                            was passed under direct vision. Throughout the                            procedure, the patient's blood  pressure, pulse, and                            oxygen saturations were monitored continuously. The                            Colonoscope was introduced through the anus and                            advanced to the the cecum, identified by                            appendiceal orifice and ileocecal valve. The                            colonoscopy was performed without difficulty. The                            patient tolerated the procedure well. The quality                            of the bowel preparation was excellent. The                            terminal ileum, ileocecal valve, appendiceal                            orifice, and rectum were photographed. Scope In: 2:08:00 PM Scope Out: 2:24:14 PM Scope Withdrawal Time: 0 hours 10 minutes 41 seconds  Total Procedure Duration: 0 hours 16 minutes 14 seconds  Findings:                 The perianal and digital rectal examinations were                            normal.                           A 6 mm polyp was found in the transverse colon. The  polyp was sessile. The polyp was removed with a                            cold snare. Resection and retrieval were complete.                           Non-bleeding internal hemorrhoids were found during                            retroflexion. The hemorrhoids were small.                           The exam was otherwise without abnormality. Complications:            No immediate complications. Estimated Blood Loss:     Estimated blood loss was minimal. Impression:               - One 6 mm polyp in the transverse colon, removed                            with a cold snare. Resected and retrieved.                           - Non-bleeding internal hemorrhoids.                           - The examination was otherwise normal. Recommendation:           - Patient has a contact number available for                            emergencies. The signs and symptoms of  potential                            delayed complications were discussed with the                            patient. Return to normal activities tomorrow.                            Written discharge instructions were provided to the                            patient.                           - Resume previous diet.                           - Continue present medications.                           - Await pathology results.                           - Repeat colonoscopy in 5 years for surveillance  based on pathology results.                           - Refer to a genetics counselor at the next                            available appointment. Mauri Pole, MD 03/09/2018 2:30:50 PM This report has been signed electronically.

## 2018-03-09 NOTE — Progress Notes (Signed)
Report to PACU, RN, vss, BBS= Clear.  

## 2018-03-09 NOTE — Progress Notes (Signed)
Called to room to assist during endoscopic procedure.  Patient ID and intended procedure confirmed with present staff. Received instructions for my participation in the procedure from the performing physician.  

## 2018-03-09 NOTE — Progress Notes (Signed)
Pt's states no medical or surgical changes since previsit or office visit. 

## 2018-03-09 NOTE — Patient Instructions (Signed)
**   Handouts given on polyps and hemorrhoids  YOU HAD AN ENDOSCOPIC PROCEDURE TODAY AT THE [facility]:   Refer to the procedure report that was given to you for any specific questions about what was found during the examination.  If the procedure report does not answer your questions, please call your gastroenterologist to clarify.  If you requested that your care partner not be given the details of your procedure findings, then the procedure report has been included in a sealed envelope for you to review at your convenience later.  YOU SHOULD EXPECT: Some feelings of bloating in the abdomen. Passage of more gas than usual.  Walking can help get rid of the air that was put into your GI tract during the procedure and reduce the bloating. If you had a lower endoscopy (such as a colonoscopy or flexible sigmoidoscopy) you may notice spotting of blood in your stool or on the toilet paper. If you underwent a bowel prep for your procedure, you may not have a normal bowel movement for a few days.  Please Note:  You might notice some irritation and congestion in your nose or some drainage.  This is from the oxygen used during your procedure.  There is no need for concern and it should clear up in a day or so.  SYMPTOMS TO REPORT IMMEDIATELY:   Following lower endoscopy (colonoscopy or flexible sigmoidoscopy):  Excessive amounts of blood in the stool  Significant tenderness or worsening of abdominal pains  Swelling of the abdomen that is new, acute  Fever of 100F or higher   For urgent or emergent issues, a gastroenterologist can be reached at any hour by calling [phone].   DIET:  We do recommend a small meal at first, but then you may proceed to your regular diet.  Drink plenty of fluids but you should avoid alcoholic beverages for 24 hours.  ACTIVITY:  You should plan to take it easy for the rest of today and you should NOT DRIVE or use heavy machinery until tomorrow (because of the  sedation medicines used during the test).    FOLLOW UP: Our staff will call the number listed on your records the next business day following your procedure to check on you and address any questions or concerns that you may have regarding the information given to you following your procedure. If we do not reach you, we will leave a message.  However, if you are feeling well and you are not experiencing any problems, there is no need to return our call.  We will assume that you have returned to your regular daily activities without incident.  If any biopsies were taken you will be contacted by phone or by letter within the next 1-3 weeks.  Please call us at [phone] if you have not heard about the biopsies in 3 weeks.    SIGNATURES/CONFIDENTIALITY: You and/or your care partner have signed paperwork which will be entered into your electronic medical record.  These signatures attest to the fact that that the information above on your After Visit Summary has been reviewed and is understood.  Full responsibility of the confidentiality of this discharge information lies with you and/or your care-partner. 

## 2018-03-10 ENCOUNTER — Telehealth: Payer: Self-pay

## 2018-03-10 ENCOUNTER — Telehealth: Payer: Self-pay | Admitting: *Deleted

## 2018-03-10 ENCOUNTER — Other Ambulatory Visit: Payer: Self-pay

## 2018-03-10 DIAGNOSIS — Z800 Family history of malignant neoplasm of digestive organs: Secondary | ICD-10-CM

## 2018-03-10 NOTE — Telephone Encounter (Signed)
  Follow up Call-  Call back number 03/09/2018  Post procedure Call Back phone  # [phone]  Permission to leave phone message Yes  Some recent data might be hidden     Patient questions:  Do you have a fever, pain , or abdominal swelling? No. Pain Score  0 *  Have you tolerated food without any problems? Yes.    Have you been able to return to your normal activities? Yes.    Do you have any questions about your discharge instructions: Diet   No. Medications  No. Follow up visit  No.  Do you have questions or concerns about your Care? No.  Actions: * If pain score is 4 or above: No action needed, pain <4.

## 2018-03-10 NOTE — Progress Notes (Signed)
amb ref genetics  

## 2018-03-10 NOTE — Telephone Encounter (Signed)
  Follow up Call-  Call back number 03/09/2018  Post procedure Call Back phone  # [phone]  Permission to leave phone message Yes  Some recent data might be hidden     No answer, left message.

## 2018-03-10 NOTE — Telephone Encounter (Signed)
Referral placed Left message for patient to call back to discuss process

## 2018-03-13 ENCOUNTER — Other Ambulatory Visit (INDEPENDENT_AMBULATORY_CARE_PROVIDER_SITE_OTHER): Payer: 59

## 2018-03-13 DIAGNOSIS — E785 Hyperlipidemia, unspecified: Secondary | ICD-10-CM | POA: Diagnosis not present

## 2018-03-13 DIAGNOSIS — M2550 Pain in unspecified joint: Secondary | ICD-10-CM

## 2018-03-13 LAB — LIPID PANEL
CHOL/HDL RATIO: 4
Cholesterol: 221 mg/dL — ABNORMAL HIGH (ref 0–200)
HDL: 53 mg/dL (ref >39.00)
LDL Cholesterol: 146 mg/dL — ABNORMAL HIGH (ref 0–99)
NONHDL: 168.18
Triglycerides: 111 mg/dL (ref 0.0–149.0)
VLDL: 22.2 mg/dL (ref 0.0–40.0)

## 2018-03-13 LAB — COMPREHENSIVE METABOLIC PANEL
ALBUMIN: 4.4 g/dL (ref 3.5–5.2)
ALK PHOS: 39 U/L (ref 39–117)
ALT: 28 U/L (ref 0–35)
AST: 24 U/L (ref 0–37)
BUN: 13 mg/dL (ref 6–23)
CALCIUM: 9 mg/dL (ref 8.4–10.5)
CHLORIDE: 105 meq/L (ref 96–112)
CO2: 28 mEq/L (ref 19–32)
CREATININE: 0.61 mg/dL (ref 0.40–1.20)
GFR: 114.89 mL/min (ref >60.00)
Glucose, Bld: 91 mg/dL (ref 70–99)
POTASSIUM: 3.6 meq/L (ref 3.5–5.1)
Sodium: 141 mEq/L (ref 135–145)
TOTAL PROTEIN: 7.6 g/dL (ref 6.0–8.3)
Total Bilirubin: 0.4 mg/dL (ref 0.2–1.2)

## 2018-03-13 LAB — CK: Total CK: 109 U/L (ref 7–177)

## 2018-03-13 LAB — SEDIMENTATION RATE: Sed Rate: 6 mm/hr (ref 0–20)

## 2018-03-14 ENCOUNTER — Telehealth: Payer: Self-pay | Admitting: *Deleted

## 2018-03-14 NOTE — Telephone Encounter (Signed)
Due to extensive number of questions-I would encourage an office visit for discussion.  I would not stop that statin.

## 2018-03-14 NOTE — Telephone Encounter (Signed)
Please Advise

## 2018-03-14 NOTE — Telephone Encounter (Signed)
Copied from [place] [id number]. Topic: Quick Communication - See Telephone Encounter >> Mar 14, 2018 12:37 PM Hewitt Shorts wrote: Pt has several questions for Dr. Yong Channel :  she wants to talk to him about her lab results, talk about scheduling a colonoscopy , does he feel that she needs to have genetic testing for cancer and does she need to stop her statins until her appt with dt nelson the cardiologist  on 06/23/18   Best number [phone]

## 2018-03-15 ENCOUNTER — Telehealth: Payer: Self-pay | Admitting: Oncology

## 2018-03-15 NOTE — Telephone Encounter (Signed)
Called patient to schedule appointment, but patient did not answer. I left message saying to call our office back to schedule an appointment due to number of questions she has.

## 2018-03-15 NOTE — Telephone Encounter (Signed)
appt scheduled per 5/20 sch message - pt is aware of appt date and time.

## 2018-03-16 NOTE — Telephone Encounter (Signed)
Patient is scheduled for 03/21/2018

## 2018-03-21 ENCOUNTER — Ambulatory Visit: Payer: 59 | Admitting: Family Medicine

## 2018-03-21 ENCOUNTER — Encounter: Payer: Self-pay | Admitting: Family Medicine

## 2018-03-21 ENCOUNTER — Encounter: Payer: Self-pay | Admitting: *Deleted

## 2018-03-21 ENCOUNTER — Encounter: Payer: Self-pay | Admitting: Gastroenterology

## 2018-03-21 VITALS — BP 122/80 | HR 81 | Temp 97.9°F | Ht 60.0 in | Wt 130.6 lb

## 2018-03-21 DIAGNOSIS — E7849 Other hyperlipidemia: Secondary | ICD-10-CM | POA: Diagnosis not present

## 2018-03-21 DIAGNOSIS — F419 Anxiety disorder, unspecified: Secondary | ICD-10-CM

## 2018-03-21 DIAGNOSIS — Z8371 Family history of colonic polyps: Secondary | ICD-10-CM | POA: Diagnosis not present

## 2018-03-21 DIAGNOSIS — R0781 Pleurodynia: Secondary | ICD-10-CM | POA: Diagnosis not present

## 2018-03-21 MED ORDER — VENLAFAXINE HCL ER 75 MG PO CP24: 75.0000 mg | ORAL_CAPSULE | Freq: Every day | ORAL | 3 refills | Status: DC

## 2018-03-21 NOTE — Assessment & Plan Note (Signed)
S: she remains on Venlafaxine 75 mg extended release. Reasonable control of anxiety though still deals with some anxiety for health related issues. No SI reported.  A/P: continue current medicine- refill provided

## 2018-03-21 NOTE — Assessment & Plan Note (Signed)
S:LDL only down 9 points despite taking livalo every other day. When she exercises on ellipitcal loses feeling on both feet and then numbness on left side worsens (the area she had CT scan for recently).   When she was on crestor had similar feelings- numbness feelings improved off crestor then worsened again on livalo. Similar symptoms with jogging.   She opted to stop the livalo. She is interested in [place] and is trying to get into lipid cinic (seeing Dr. Meda Coffee)  Once again prior trials of rosuvastatin 5mg  led to intense myalgias.  A/P:I think seeing lipid clinic is reasonable- I think PCSK9 inhibitor would be a great choice if she qualifies

## 2018-03-21 NOTE — Progress Notes (Signed)
Subjective:  Katelyn Shelton is a 42 y.o. year old very pleasant female patient who presents for/with See problem oriented charting ROS- admits to worsening numbness in feet when on livalo. Denies chest pain or shortness of breath. No blurry vision. No worsening edema.    Past Medical History-  Patient Active Problem List   Diagnosis Date Noted  . Invasive ductal carcinoma of breast, female, right ([place]) 03/31/2017    Priority: High  . Malignant neoplasm of upper-outer quadrant of right breast in female, estrogen receptor positive ([place]) 02/11/2017    Priority: High  . Hyperlipidemia, familial, high LDL 01/16/2010    Priority: High  . Arthralgia 02/24/2018    Priority: Medium  . Enchondroma of bone 02/24/2018    Priority: Medium  . Anxiety     Priority: Medium  . Hidradenitis suppurativa 08/26/2011    Priority: Medium  . Family history of colonic polyps 02/24/2018    Priority: Low  . Genetic testing 02/21/2017    Priority: Low  . Family history of stomach cancer     Priority: Low    Medications- reviewed and updated Current Outpatient Medications  Medication Sig Dispense Refill  . acetaminophen (TYLENOL) 325 MG tablet Take 650 mg by mouth every 6 (six) hours as needed.    [name] anastrozole (ARIMIDEX) 1 MG tablet Take 1 tablet (1 mg total) by mouth daily. 90 tablet 4  . Calcium Carbonate (CALCIUM 600 PO) Take by mouth daily.    [name] denosumab (PROLIA) 60 MG/ML SOSY injection Inject 60 mg into the skin every 6 (six) months.    . fluticasone (FLONASE) 50 MCG/ACT nasal spray Place 1 spray into both nostrils daily as needed.    [name] GARCINIA CAMBOGIA-CHROMIUM PO Take by mouth.    . Goserelin Acetate (ZOLADEX [place]) Inject 1 Dose into the skin every 30 (thirty) days.    . Linoleic Acid Conjugated 1000 MG CAPS Take by mouth.    . Multiple Vitamins-Minerals (MULTIVITAMIN WOMEN 50+) TABS     . Pitavastatin Calcium 2 MG TABS Take 1 tablet (2 mg total) by mouth 2 (two) times a week. Take 1 tablet on  Mondays and Thursdays 30 tablet 6  . venlafaxine XR (EFFEXOR-XR) 75 MG 24 hr capsule Take 1 capsule (75 mg total) by mouth daily with breakfast. 90 capsule 3   No current facility-administered medications for this visit.     Objective: BP 122/80 (BP Location: Left Arm, Patient Position: Sitting, Cuff Size: Normal)   Pulse 81   Temp 97.9 F (36.6 C) (Oral)   Ht 5' (1.524 m)   Wt 130 lb 9.6 oz (59.2 kg)   SpO2 98%   BMI 25.51 kg/m  Gen: NAD, resting comfortably CV: RRR no murmurs rubs or gallops Some right lateral rib pain over the rib itself, not in musculature Lungs: CTAB no crackles, wheeze, rhonchi Ext: no edema Skin: warm, dry, no rash Neuro: grossly normal, moves all extremities Some anxiety related to prior cancers  Assessment/Plan:  Right rib pain.  S:  sore spot along right rib. A few days. No fall or injury. Some lifting recently with luggage.  Has not tried heat or iceyet.  A/P: Strongly suspect MSK cause. Patient is concerned due to her cancer history. Advised rib films at 6 weeks if persits. She is going out of country June 20th though and will reach out if still having pain at that point to get right sided rib films.   Hyperlipidemia, familial, high LDL S:LDL only  down 9 points despite taking livalo every other day. When she exercises on ellipitcal loses feeling on both feet and then numbness on left side worsens (the area she had CT scan for recently).   When she was on crestor had similar feelings- numbness feelings improved off crestor then worsened again on livalo. Similar symptoms with jogging.   She opted to stop the livalo. She is interested in [place] and is trying to get into lipid cinic (seeing Dr. Meda Coffee)  Once again prior trials of rosuvastatin 5mg  led to intense myalgias.  A/P:I think seeing lipid clinic is reasonable- I think PCSK9 inhibitor would be a great choice if she qualifies  Family history of colonic polyps She had a polyp with inflammatory  polyp only. She found out sister did not have precancerous polyp as well. She is going to do genetic testing with history of early  breast cancer and she wants to ask about colon cancer risks as well and frequency of colonoscopy- she Is already scheduled to see genetics.   Anxiety S: she remains on Venlafaxine 75 mg extended release. Reasonable control of anxiety though still deals with some anxiety for health related issues. No SI reported.  A/P: continue current medicine- refill provided    Future Appointments  Date Time Provider [place]  03/30/2018 11:00 AM Dorothy Spark, MD [facility]  03/31/2018  8:00 AM [facility] INJ NURSE [facility] None  05/01/2018  9:30 AM Magrinat, Virgie Dad, MD [facility] None  05/01/2018 10:00 AM [facility] INJ NURSE [facility] None  05/02/2018 10:00 AM Tana Felts [facility] None  05/02/2018 11:00 AM [facility] LAB 2 [facility] None   We did not schedule planned follow up  Meds ordered this encounter  Medications  . venlafaxine XR (EFFEXOR-XR) 75 MG 24 hr capsule    Sig: Take 1 capsule (75 mg total) by mouth daily with breakfast.    Dispense:  90 capsule    Refill:  3    Return precautions advised.  Garret Reddish, MD

## 2018-03-21 NOTE — Patient Instructions (Signed)
I agree- trying to pursue the pcsk9 inhibitors like repatha makes sense if you can get qualified for them- hopefully Dr. Meda Coffee and lipid clinic can get [place] more info  Reasonable to hold on statins until you see her  Glad you have the genetics appointment scheduled  If your rib pain is not better in 4 weeks with conservative care such as heat 3x a day for 15 minutes- we can do an x-ray before you travel out of town in June

## 2018-03-21 NOTE — Assessment & Plan Note (Signed)
She had a polyp with inflammatory polyp only. She found out sister did not have precancerous polyp as well. She is going to do genetic testing with history of early  breast cancer and she wants to ask about colon cancer risks as well and frequency of colonoscopy- she Is already scheduled to see genetics.

## 2018-03-21 NOTE — Telephone Encounter (Signed)
Erroneous encounter

## 2018-03-30 ENCOUNTER — Encounter: Payer: Self-pay | Admitting: Cardiology

## 2018-03-30 ENCOUNTER — Ambulatory Visit (INDEPENDENT_AMBULATORY_CARE_PROVIDER_SITE_OTHER)
Admission: RE | Admit: 2018-03-30 | Discharge: 2018-03-30 | Disposition: A | Discharge status: Home / Self Care | Payer: 59 | Source: Ambulatory Visit | Attending: Cardiology | Admitting: Cardiology

## 2018-03-30 ENCOUNTER — Ambulatory Visit (INDEPENDENT_AMBULATORY_CARE_PROVIDER_SITE_OTHER): Payer: 59 | Admitting: Cardiology

## 2018-03-30 ENCOUNTER — Other Ambulatory Visit: Payer: Self-pay | Admitting: Oncology

## 2018-03-30 VITALS — BP 122/82 | HR 69 | Ht 60.0 in | Wt 130.0 lb

## 2018-03-30 DIAGNOSIS — E7849 Other hyperlipidemia: Secondary | ICD-10-CM | POA: Diagnosis not present

## 2018-03-30 NOTE — Patient Instructions (Signed)
Medication Instructions:   Your physician recommends that you continue on your current medications as directed. Please refer to the Current Medication list given to you today.   Labwork:  TODAY--LIPOPROTEIN A, APOLIPOPROTEIN B, AND NMR LIPOPROFILE     Testing/Procedures:  CARDIAC CT SCORING TO BE DONE TODAY     Follow-Up:  PLEASE ADD THE PATIENT IN [facility] SCHEDULE FOR July 2019       If you need a refill on your cardiac medications before your next appointment, please call your pharmacy.

## 2018-03-30 NOTE — Progress Notes (Signed)
Cardiology Office Note    Date:  04/05/2018   ID:  Katelyn Shelton, DOB [birth year], MRN [id number]  PCP:  Marin Olp, MD  Cardiologist: Ena Dawley, MD   Reason for visit: Hyperlipidemia  History of Present Illness:  Katelyn Shelton is a 42 y.o. female with prior medical h/o hyperlipidemia and breast cancer. She was diagnosed with metastatic breast cancer in the right breast, estrogen positive in 2018.  She was started on anastrozole (Arimidex) in December 2019, ovarian suppression started in October 2018with monthly goserelin (Zoladex), bone density 11/25/17 shows osteopenia with t score -2.0 and now on denosumab (Prolia) for osteoporosis since March 2019.   She has FH of hyperlipidemia in her mother. Her maternal grandfather had MI, he was a smoker. She has known hyperlipidemia since at least 2010. She was started on Crestor in October 2019 that caused significant neuropathy and was discontinued. Livalo was tried in March 2019 but caused burning of the left thigh to the point that she had to limit her exercise. She was also experiencing significant morning stiffness. It gets better with motion and exercise. She is also experiencing generalized joint pain. All these symptoms are new and started after she was treated for breast cancer.  She states she started with soreness in joints on aromatase inhibitor/arimedex but also states had similar phenomenon on Crestor.   She exercises almost daily and eats very healthy diet.  She denies any chest pain, SOB, no LE edema, orthopnea , no palpitations.   Past Medical History:  Diagnosis Date  . Allergy   . Anxiety    venflafaxine XR 75 mg  . Breast cancer ([place]) 01/2017   right; genetic testing negative in 2018 with [facility] panel  . Heart murmur    as child- went away  . History of MRSA infection    elbow  . Hyperlipidemia   . Joint pain    due to medicine induced joint pain  . Migraine    when on birth control pills  . Miscarriage     x1  . Neuromuscular disorder ([place])   . Osteopenia   . UTI (urinary tract infection)     Past Surgical History:  Procedure Laterality Date  . BREAST RECONSTRUCTION WITH PLACEMENT OF TISSUE EXPANDER AND FLEX HD (ACELLULAR HYDRATED DERMIS) Bilateral 02/28/2017   Procedure: BILATERAL BREAST RECONSTRUCTION WITH PLACEMENT OF TISSUE EXPANDER AND ALLODERM;  Surgeon: Irene Limbo, MD;  Location: [facility] [facility];  Service: Plastics;  Laterality: Bilateral;  . CESAREAN SECTION     x 2 (3 total)  . CESAREAN SECTION  02/04/2012   Procedure: CESAREAN SECTION;  Surgeon: Cyril Mourning, MD;  Location: [facility];  Service: Gynecology;  Laterality: N/A;  Repeat Cesarean Section Delivery  Boy  @  [phone], Apgars 9/10  . DILATION AND CURETTAGE OF UTERUS    . EXCISION OF BREAST LESION Right 03/15/2017   Procedure: RIGHT NIPPLE AND AREOLA EXCISION;  Surgeon: Rolm Bookbinder, MD;  Location: [facility];  Service: General;  Laterality: Right;  . NIPPLE SPARING MASTECTOMY Bilateral 02/28/2017   Procedure: RIGHT NIPPLE SPARING MASTECTOMY; LEFT PROPHYLACTIC NIPPLE SPARING MASTECTOMY;  Surgeon: Rolm Bookbinder, MD;  Location: [place];  Service: General;  Laterality: Bilateral;  . PECTUS EXCAVATUM REPAIR  9070   42 years old- very large implant  . SENTINEL NODE BIOPSY Right 02/28/2017   Procedure: RIGHT AXILLARY SENTINEL LYMPH  NODE BIOPSY;  Surgeon: Rolm Bookbinder, MD;  Location: [facility];  Service: General;  Laterality: Right;  . VULVAR LESION REMOVAL    . WISDOM TOOTH EXTRACTION     Current Medications: Outpatient Medications Prior to Visit  Medication Sig Dispense Refill  . acetaminophen (TYLENOL) 325 MG tablet Take 650 mg by mouth every 6 (six) hours as needed.    [name] anastrozole (ARIMIDEX) 1 MG tablet Take 1 tablet (1 mg total) by mouth daily. 90 tablet 4  . Calcium Carbonate (CALCIUM 600 PO) Take 1 capsule by mouth daily.     [name] denosumab  (PROLIA) 60 MG/ML SOSY injection Inject 60 mg into the skin every 6 (six) months.    . fluticasone (FLONASE) 50 MCG/ACT nasal spray Place 1 spray into both nostrils daily as needed.    [name] GARCINIA CAMBOGIA-CHROMIUM PO Take 1 capsule by mouth daily.     . Goserelin Acetate (ZOLADEX [place]) Inject 1 Dose into the skin every 30 (thirty) days.    . Linoleic Acid Conjugated 1000 MG CAPS Take 1 capsule by mouth daily.     . Multiple Vitamins-Minerals (MULTIVITAMIN WOMEN 50+) TABS Take 1 tablet by mouth daily.     . Pitavastatin Calcium 2 MG TABS Take 1 tablet (2 mg total) by mouth 2 (two) times a week. Take 1 tablet on Mondays and Thursdays 30 tablet 6  . venlafaxine XR (EFFEXOR-XR) 75 MG 24 hr capsule Take 1 capsule (75 mg total) by mouth daily with breakfast. 90 capsule 3   No facility-administered medications prior to visit.      Allergies:   Peach flavor; Fentanyl; Penicillins; Sulfa antibiotics; Sulfasalazine; and Latex   Social History   Socioeconomic History  . Marital status: Married    Spouse name: Not on file  . Number of children: Not on file  . Years of education: Not on file  . Highest education level: Not on file  Occupational History  . Not on file  Social Needs  . Financial resource strain: Not on file  . Food insecurity:    Worry: Not on file    Inability: Not on file  . Transportation needs:    Medical: Not on file    Non-medical: Not on file  Tobacco Use  . Smoking status: Never Smoker  . Smokeless tobacco: Never Used  Substance and Sexual Activity  . Alcohol use: No    Alcohol/week: 0.0 - 0.6 oz  . Drug use: No  . Sexual activity: Yes  Lifestyle  . Physical activity:    Days per week: Not on file    Minutes per session: Not on file  . Stress: Not on file  Relationships  . Social connections:    Talks on phone: Not on file    Gets together: Not on file    Attends religious service: Not on file    Active member of club or organization: Not on file    Attends  meetings of clubs or organizations: Not on file    Relationship status: Not on file  Other Topics Concern  . Not on file  Social History Narrative   Married to Dr. Aundra Dubin. 3 children.    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    Masters in public history at [employer]. Undergrad at [employer]: running, reading, exercise     Family History:  The patient's family history includes Alzheimer's disease in her paternal grandfather; Breast cancer in her  cousin; Colon polyps in her maternal uncle, mother, and sister; Depression in her maternal grandfather; Diabetes in her maternal aunt; Early death in her maternal grandfather; Heart attack in her maternal grandfather; Heart failure in her maternal grandmother; Hyperlipidemia in her father and mother; Hypertension in her mother; 58 / Stillbirths in her sister; Parkinson's disease (age of onset: 56) in her sister; Prostate cancer in her paternal grandfather; Skin cancer in her mother; Stomach cancer in her paternal grandmother; Stroke in her maternal grandmother.   ROS:   Please see the history of present illness.    ROS All other systems reviewed and are negative.  PHYSICAL EXAM:   VS:  BP 122/82   Pulse 69   Ht 5' (1.524 m)   Wt 130 lb (59 kg)   SpO2 97%   BMI 25.39 kg/m    GEN: Well nourished, well developed, in no acute distress  HEENT: normal  Neck: no JVD, carotid bruits, or masses Cardiac: RRR; no murmurs, rubs, or gallops,no edema  Respiratory:  clear to auscultation bilaterally, normal work of breathing GI: soft, nontender, nondistended, + BS MS: no deformity or atrophy  Skin: warm and dry, no rash Neuro:  Alert and Oriented x 3, Strength and sensation are intact Psych: euthymic mood, full affect  Wt Readings from Last 3 Encounters:  03/30/18 130 lb (59 kg)  03/21/18 130 lb 9.6 oz (59.2 kg)  03/09/18 131 lb (59.4 kg)    Studies/Labs Reviewed:   EKG:  EKG is ordered  today.  The ekg ordered today demonstrates SR, normal ECG  Recent Labs: 12/30/2017: Hemoglobin 14.4; Platelet Count 208 03/13/2018: ALT 28; BUN 13; Creatinine, Ser 0.61; Potassium 3.6; Sodium 141   Lipid Panel    Component Value Date/Time   CHOL 221 (H) 03/13/2018 0829   CHOL 199 07/12/2014 0851   TRIG 111.0 03/13/2018 0829   HDL 53.00 03/13/2018 0829   HDL 50 07/12/2014 0851   CHOLHDL 4 03/13/2018 0829   VLDL 22.2 03/13/2018 0829   LDLCALC 146 (H) 03/13/2018 0829   LDLCALC 134 (H) 07/12/2014 0851    Additional studies/ records that were reviewed today include:   CT pelvis: 02/21/2018 IMPRESSION: 1. A cause for the patient's symptoms is not identified. 2. Sclerotic lesions in the right iliac bone and right acetabulum are stable from 2015 and accordingly unlikely to represent active malignancy. 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 3. Bilateral common iliac artery atherosclerotic calcification.  Calcium score: 03/30/2018 Calcium score is 0. IMPRESSION: Clustered nodules in the inferior right upper lobe and adjacent superior right middle lobe, likely inflammatory.  ASSESSMENT:    1. Hyperlipidemia, familial, high LDL    PLAN:  In order of problems listed above:  1. Familial hyperlipidemia - LDL 178, LDL particles 1948, TG 215, Apo LP B 141, the patient has severe side effects to Crestor and Livalo, she also has evidence for PAD -calcification sof the iliac arteries, we will apply for Repatha.  2. Clustered nodules in the inferior right upper lobe and adjacent superior right middle lobe - most likely reflect postradiation changes or inflammatory. These do not have the typical appearance of metastases. But given the history of breast cancer, I would recommend follow-up chest CT without contrast in 3-6  months to ensure stability or resolution.  Follow up in 2 months, schedule a non-contrast CTA  in 3 months.  Medication Adjustments/Labs and Tests Ordered: Current medicines are reviewed at length with the patient today.  Concerns regarding medicines are outlined above.  Medication changes, Labs and Tests ordered today are listed in the Patient Instructions below. Patient Instructions  Medication Instructions:   Your physician recommends that you continue on your current medications as directed. Please refer to the Current Medication list given to you today.   Labwork:  TODAY--LIPOPROTEIN A, APOLIPOPROTEIN B, AND NMR LIPOPROFILE     Testing/Procedures:  CARDIAC CT SCORING TO BE DONE TODAY     Follow-Up:  PLEASE ADD THE PATIENT IN [facility] SCHEDULE FOR July 2019       If you need a refill on your cardiac medications before your next appointment, please call your pharmacy.      Signed, Ena Dawley, MD  04/05/2018 12:05 AM    [address], [place],   [address] Phone: [phone]; Fax: [phone]

## 2018-03-31 ENCOUNTER — Inpatient Hospital Stay: Payer: 59 | Attending: Oncology

## 2018-03-31 VITALS — BP 100/57 | HR 84 | Temp 98.0°F | Resp 18

## 2018-03-31 DIAGNOSIS — C50411 Malignant neoplasm of upper-outer quadrant of right female breast: Secondary | ICD-10-CM | POA: Diagnosis not present

## 2018-03-31 DIAGNOSIS — Z9221 Personal history of antineoplastic chemotherapy: Secondary | ICD-10-CM | POA: Insufficient documentation

## 2018-03-31 DIAGNOSIS — Z170 Estrogen receptor positive status [ER+]: Secondary | ICD-10-CM | POA: Insufficient documentation

## 2018-03-31 DIAGNOSIS — Z923 Personal history of irradiation: Secondary | ICD-10-CM | POA: Insufficient documentation

## 2018-03-31 DIAGNOSIS — Z9013 Acquired absence of bilateral breasts and nipples: Secondary | ICD-10-CM | POA: Insufficient documentation

## 2018-03-31 DIAGNOSIS — Z79811 Long term (current) use of aromatase inhibitors: Secondary | ICD-10-CM | POA: Insufficient documentation

## 2018-03-31 LAB — NMR, LIPOPROFILE
Cholesterol, Total: 273 mg/dL — ABNORMAL HIGH (ref 100–199)
HDL Particle Number: 33.3 umol/L (ref >=30.5)
HDL-C: 52 mg/dL (ref >39)
LDL Particle Number: 1948 nmol/L — ABNORMAL HIGH (ref <1000)
LDL Size: 21.1 nm (ref >20.5)
LDL-C: 178 mg/dL — ABNORMAL HIGH (ref 0–99)
LP-IR Score: 59 — ABNORMAL HIGH (ref <=45)
Small LDL Particle Number: 804 nmol/L — ABNORMAL HIGH (ref <=527)
Triglycerides: 215 mg/dL — ABNORMAL HIGH (ref 0–149)

## 2018-03-31 LAB — APOLIPOPROTEIN B: Apolipoprotein B: 141 mg/dL — ABNORMAL HIGH (ref <90)

## 2018-03-31 LAB — LIPOPROTEIN A (LPA): Lipoprotein (a): 22 nmol/L (ref <75)

## 2018-03-31 MED ORDER — GOSERELIN ACETATE 3.6 MG ~~LOC~~ IMPL
3.6000 mg | DRUG_IMPLANT | Freq: Once | SUBCUTANEOUS | Status: AC
  Administered 2018-03-31: 3.6 mg via SUBCUTANEOUS

## 2018-03-31 MED FILL — ANASTROZOLE 1 MG TABLET: 1 | 90 days supply | Qty: 90 | Fill #2

## 2018-03-31 NOTE — Patient Instructions (Signed)
Goserelin injection What is this medicine? GOSERELIN (GOE se rel in) is similar to a hormone found in the body. It lowers the amount of sex hormones that the body makes. Men will have lower testosterone levels and women will have lower estrogen levels while taking this medicine. In men, this medicine is used to treat prostate cancer; the injection is either given once per month or once every 12 weeks. A once per month injection (only) is used to treat women with endometriosis, dysfunctional uterine bleeding, or advanced breast cancer. This medicine may be used for other purposes; ask your health care provider or pharmacist if you have questions. COMMON BRAND NAME(S): Zoladex What should I tell my health care provider before I take this medicine? They need to know if you have any of these conditions (some only apply to women): -diabetes -heart disease or previous heart attack -high blood pressure -high cholesterol -kidney disease -osteoporosis or low bone density -problems passing urine -spinal cord injury -stroke -tobacco smoker -an unusual or allergic reaction to goserelin, hormone therapy, other medicines, foods, dyes, or preservatives -pregnant or trying to get pregnant -breast-feeding How should I use this medicine? This medicine is for injection under the skin. It is given by a health care professional in a hospital or clinic setting. Men receive this injection once every 4 weeks or once every 12 weeks. Women will only receive the once every 4 weeks injection. Talk to your pediatrician regarding the use of this medicine in children. Special care may be needed. Overdosage: If you think you have taken too much of this medicine contact a poison control center or emergency room at once. NOTE: This medicine is only for you. Do not share this medicine with others. What if I miss a dose? It is important not to miss your dose. Call your doctor or health care professional if you are unable to  keep an appointment. What may interact with this medicine? -female hormones like estrogen -herbal or dietary supplements like black cohosh, chasteberry, or DHEA -female hormones like testosterone -prasterone This list may not describe all possible interactions. Give your health care provider a list of all the medicines, herbs, non-prescription drugs, or dietary supplements you use. Also tell them if you smoke, drink alcohol, or use illegal drugs. Some items may interact with your medicine. What should I watch for while using this medicine? Visit your doctor or health care professional for regular checks on your progress. Your symptoms may appear to get worse during the first weeks of this therapy. Tell your doctor or healthcare professional if your symptoms do not start to get better or if they get worse after this time. Your bones may get weaker if you take this medicine for a long time. If you smoke or frequently drink alcohol you may increase your risk of bone loss. A family history of osteoporosis, chronic use of drugs for seizures (convulsions), or corticosteroids can also increase your risk of bone loss. Talk to your doctor about how to keep your bones strong. This medicine should stop regular monthly menstration in women. Tell your doctor if you continue to menstrate. Women should not become pregnant while taking this medicine or for 12 weeks after stopping this medicine. Women should inform their doctor if they wish to become pregnant or think they might be pregnant. There is a potential for serious side effects to an unborn child. Talk to your health care professional or pharmacist for more information. Do not breast-feed an infant while taking   this medicine. Men should inform their doctors if they wish to father a child. This medicine may lower sperm counts. Talk to your health care professional or pharmacist for more information. What side effects may I notice from receiving this  medicine? Side effects that you should report to your doctor or health care professional as soon as possible: -allergic reactions like skin rash, itching or hives, swelling of the face, lips, or tongue -bone pain -breathing problems -changes in vision -chest pain -feeling faint or lightheaded, falls -fever, chills -pain, swelling, warmth in the leg -pain, tingling, numbness in the hands or feet -signs and symptoms of low blood pressure like dizziness; feeling faint or lightheaded, falls; unusually weak or tired -stomach pain -swelling of the ankles, feet, hands -trouble passing urine or change in the amount of urine -unusually high or low blood pressure -unusually weak or tired Side effects that usually do not require medical attention (report to your doctor or health care professional if they continue or are bothersome): -change in sex drive or performance -changes in breast size in both males and females -changes in emotions or moods -headache -hot flashes -irritation at site where injected -loss of appetite -skin problems like acne, dry skin -vaginal dryness This list may not describe all possible side effects. Call your doctor for medical advice about side effects. You may report side effects to FDA at 1-800-FDA-1088. Where should I keep my medicine? This drug is given in a hospital or clinic and will not be stored at home. NOTE: This sheet is a summary. It may not cover all possible information. If you have questions about this medicine, talk to your doctor, pharmacist, or health care provider.  2018 Elsevier/Gold Standard (2013-12-18 11:10:35)  

## 2018-04-04 ENCOUNTER — Telehealth: Payer: Self-pay | Admitting: Pharmacist

## 2018-04-04 NOTE — Telephone Encounter (Signed)
Consulted by Dr Meda Coffee to pursue PCSK9i therapy for patient. She has previously taken rosuvastatin 5mg  daily and pitavastatin 2mg  twice weekly, however both caused significant muscle pain which had been exacerbated by her chemotherapy drug regimen. Her advanced lipid panel from 6/6/198 revealed elevated LDL-C at 178, LDL particle # of 1947, and ApoB of 141. CT of her pelvis from 02/21/2018 revealed bilateral common iliac artery atherosclerotic calcification. Will submit prior authorization for Repatha therapy.

## 2018-04-05 ENCOUNTER — Telehealth: Payer: Self-pay | Admitting: *Deleted

## 2018-04-05 DIAGNOSIS — R911 Solitary pulmonary nodule: Secondary | ICD-10-CM | POA: Insufficient documentation

## 2018-04-05 DIAGNOSIS — Z170 Estrogen receptor positive status [ER+]: Principal | ICD-10-CM

## 2018-04-05 DIAGNOSIS — Z859 Personal history of malignant neoplasm, unspecified: Secondary | ICD-10-CM | POA: Insufficient documentation

## 2018-04-05 DIAGNOSIS — R918 Other nonspecific abnormal finding of lung field: Secondary | ICD-10-CM | POA: Insufficient documentation

## 2018-04-05 DIAGNOSIS — C50411 Malignant neoplasm of upper-outer quadrant of right female breast: Secondary | ICD-10-CM

## 2018-04-05 NOTE — Telephone Encounter (Signed)
-----   Message from Dorothy Spark, MD sent at 04/05/2018 12:11 AM EDT ----- Karlene Einstein, can you please schedule a chest CT without contrast for [facility] 3 months from now? Thank you, [place] Siren

## 2018-04-05 NOTE — Telephone Encounter (Signed)
Pt made aware that she will need a chest CT WO contrast to be done in our office in 3 months, due to over-read finding of lung lesions/nodules found on her recent calcium score she had done in our office. Dr Meda Coffee made the pt aware of these findings and recommendation to have a repeat Chest CT WO Contrast to be done in 3 months from now.  Pt is aware that our scheduling team will call her to arrange this image for 3 months out.

## 2018-04-06 NOTE — Telephone Encounter (Signed)
Chest CT WO Contrast scheduled for 10/10/18, as this date was requested by the pt.

## 2018-04-06 NOTE — Telephone Encounter (Signed)
FW: CT CHEST WO CONTRAST FYI  Received: Today  Message Contents  Osvaldo Shipper, NT  Nuala Alpha, LPN        IVY I spoke with Katelyn Shelton she does not want to have FU CT until December.   Thank you  Erline Levine   Previous Messages    ----- Message -----  From: Howie Ill  Sent: 04/05/2018 10:54 AM  To: Osvaldo Shipper, NT, *  Subject: CT CHEST WO CONTRAST

## 2018-04-10 ENCOUNTER — Telehealth: Payer: Self-pay | Admitting: Pharmacist

## 2018-04-10 DIAGNOSIS — E7849 Other hyperlipidemia: Secondary | ICD-10-CM

## 2018-04-10 MED FILL — VENLAFAXINE HCL ER 75 MG CA: 75 | 90 days supply | Qty: 90 | Fill #0

## 2018-04-10 NOTE — Telephone Encounter (Signed)
Prior authorization for Repatha has been approved for 1 year. LMOM for pt to see which pharmacy she would like rx sent to. She will qualify for $5 copay card.

## 2018-04-11 DIAGNOSIS — Z6824 Body mass index (BMI) 24.0-24.9, adult: Secondary | ICD-10-CM | POA: Diagnosis not present

## 2018-04-11 DIAGNOSIS — Z01419 Encounter for gynecological examination (general) (routine) without abnormal findings: Secondary | ICD-10-CM | POA: Diagnosis not present

## 2018-04-11 MED ORDER — EVOLOCUMAB 140 MG/ML ~~LOC~~ SOAJ: 1.0000 "pen " | SUBCUTANEOUS | 11 refills | Status: DC

## 2018-04-11 NOTE — Telephone Encounter (Signed)
Rx sent to [facility] per pt request. She will come in 7/8 at 2:30 for Repatha injection training.

## 2018-04-11 NOTE — Addendum Note (Signed)
Addended by: Alijah Akram E on: 04/11/2018 01:20 PM   Modules accepted: Orders

## 2018-04-30 NOTE — Progress Notes (Signed)
[place]  Telephone:([phone]) [phone] Fax:(336) [phone]     ID: [name] DOB: [birth year]  MR#: [id number]  WER#:[id number]  Patient Care Team: Marin Olp, MD as PCP - General (Family Medicine) Magrinat, Virgie Dad, MD as Consulting Physician (Oncology) Rolm Bookbinder, MD as Consulting Physician (General Surgery) Louretta Shorten, MD as Consulting Physician (Obstetrics and Gynecology) Juanita Craver Gerrie Nordmann, MD as Referring Physician (Hematology and Oncology) Kyung Rudd, MD as Consulting Physician (Radiation Oncology) Irene Limbo, MD as Consulting Physician (Plastic Surgery) Sorscher, Danice Goltz, MD as Referring Physician (Hematology and Oncology) Muss, Demaris Callander as Consulting Physician (Internal Medicine) Kathrynn Ducking, MD as Consulting Physician (Neurology) OTHER MD:  CHIEF COMPLAINT: Estrogen receptor positive breast cancer  CURRENT TREATMENT: anastrozole, goserelin   INTERVAL HISTORY: Katelyn Shelton returns today for follow-up and treatment of her estrogen receptor positive breast cancer. She is accompanied by her friend, Katelyn Shelton.   The patient continues on anastrozole, which she tolerates well. She endorses hot flashes that are manageable.   She also receives goserelin every 4 weeks, with a dose due today.  Since her last visit here she underwent a CT pelvis without contrast on 02/22/2018, showing: no cause for the patient's symptoms is identified. "Sclerotic lesions in the right iliac bone and right acetabulum are stable from 2015 and accordingly unlikely to represent active malignancy. 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 Bilateral common iliac artery atherosclerotic calcification."  She also underwent a CT cardiac scoring on 03/30/2018, showing clustered nodules in the inferior right  upper lobe and adjacent superior right middle lobe, likely inflammatory.  She is scheduled for a laparoscopic assisted vaginal hysterectomy with salpingo oophorectomy on 06/29/2018.    REVIEW OF SYSTEMS: Katelyn Shelton is doing well. 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 Adding she only experiences neuropathy in her feet when she exercises. Which has made it hard for her to continue exercising. She has tried various medications to help with her cholesterol and neuropathy. She has since been approved for the Repatha injection. She is discussing reconstruction surgery with [name]. The patient denies unusual headaches, visual changes, nausea, vomiting, or dizziness. There has been no unusual cough, phlegm production, or pleurisy. This been no change in bowel or bladder habits. The patient denies unexplained fatigue or unexplained weight loss, bleeding, rash, or fever. A detailed review of systems was otherwise noncontributory.    BREAST CANCER HISTORY: From the original intake note:  "Katelyn Shelton" saw Dr. Corinna Capra for routine follow-up and was found to have a palpable mass in the upper outer quadrant of her right breast and was set up for bilateral diagnostic mammography with tomography and right breast ultrasonography at the [facility] 02/08/2017. The breast density was category C. In the upper outer quadrant of the right breast there was an irregular mass. There was also a 0.3 cm group of slightly heterogeneous calcifications in the retroareolar left breast. On exam there was indeed a firm palpable mass at the 10:00 position of the right breast 4 cm from the nipple. On ultrasound this measured 3.5 cm, it was irregular and hypoechoic. There was no abnormal right axillary adenopathy.  Biopsy of the right breast mass in question 02/08/2017 showed invasive ductal carcinoma, grade 2, estrogen receptor 90% positive, 90% positive, both with strong staining  intensity, with an MIB-1 of 5%, and no HER-2 amplification by immunohistochemistry (1+). The area of left breast calcifications was biopsied 02/09/2017 and showed only fibrocystic changes. (SAA [id number]).  Her subsequent history is as detailed below.  PAST MEDICAL HISTORY: Past Medical History:  Diagnosis Date  . Allergy   . Anxiety    venflafaxine XR 75 mg  . Breast cancer ([place]) 01/2017   right; genetic testing negative in 2018 with [facility] panel  . Heart murmur    as child- went away  . History of MRSA infection    elbow  . Hyperlipidemia   . Joint pain    due to medicine induced joint pain  . Migraine    when on birth control pills  . Miscarriage    x1  . Neuromuscular disorder ([place])   . Osteopenia   . UTI (urinary tract infection)     PAST SURGICAL HISTORY: Past Surgical History:  Procedure Laterality Date  . BREAST RECONSTRUCTION WITH PLACEMENT OF TISSUE EXPANDER AND FLEX HD (ACELLULAR HYDRATED DERMIS) Bilateral 02/28/2017   Procedure: BILATERAL BREAST RECONSTRUCTION WITH PLACEMENT OF TISSUE EXPANDER AND ALLODERM;  Surgeon: Irene Limbo, MD;  Location: [facility];  Service: Plastics;  Laterality: Bilateral;  . CESAREAN SECTION     x 2 (3 total)  . CESAREAN SECTION  02/04/2012   Procedure: CESAREAN SECTION;  Surgeon: Cyril Mourning, MD;  Location: [facility];  Service: Gynecology;  Laterality: N/A;  Repeat Cesarean Section Delivery  Boy  @  [phone], Apgars 9/10  . DILATION AND CURETTAGE OF UTERUS    . EXCISION OF BREAST LESION Right 03/15/2017   Procedure: RIGHT NIPPLE AND AREOLA EXCISION;  Surgeon: Rolm Bookbinder, MD;  Location: [facility];  Service: General;  Laterality: Right;  . NIPPLE SPARING MASTECTOMY Bilateral 02/28/2017   Procedure: RIGHT NIPPLE SPARING MASTECTOMY; LEFT PROPHYLACTIC NIPPLE SPARING MASTECTOMY;  Surgeon: Rolm Bookbinder, MD;  Location: [facility];  Service: General;  Laterality: Bilateral;  .  PECTUS EXCAVATUM REPAIR  7013   41 years old- very large implant  . SENTINEL NODE BIOPSY Right 02/28/2017   Procedure: RIGHT AXILLARY SENTINEL LYMPH  NODE BIOPSY;  Surgeon: Rolm Bookbinder, MD;  Location: [facility];  Service: General;  Laterality: Right;  . VULVAR LESION REMOVAL    . WISDOM TOOTH EXTRACTION      FAMILY HISTORY Family History  Problem Relation Age of Onset  . Skin cancer Mother   . Hyperlipidemia Mother   . Hypertension Mother   . Colon polyps Mother   . Hyperlipidemia Father   . Parkinson's disease Sister 60       early on set  . Miscarriages / Stillbirths Sister   . Diabetes Maternal Aunt   . Colon polyps Maternal Uncle   . Heart failure Maternal Grandmother   . Stroke Maternal Grandmother   . Heart attack Maternal Grandfather   . Depression Maternal Grandfather   . Early death Maternal Grandfather        69 MI  . Stomach cancer Paternal Grandmother   . Prostate cancer Paternal Grandfather   . Alzheimer's disease Paternal Grandfather   . Breast cancer Cousin        mother's paternal first FEMALE cousin  . Colon polyps Sister   . Colon cancer Neg Hx   . Rectal cancer Neg Hx    The patient's father, Carolyne Fiscal, is an Administrator, Civil Service, currently 41 years old. The patient's mother is 70 years  old as of April 2018. Patient has no brothers, 2 sisters. There is a history breast cancer in the family in a great aunt on the patient's father's side. On the maternal side there is a female first cousin of the patient's mother with breast cancer. There is no history of ovarian cancer in the family  GYNECOLOGIC HISTORY:  No LMP recorded. (Menstrual status: Other). Katelyn Shelton had her first menstrual period age 66, her first live birth age 19. She has had 3 C-sections. Her periods are approximately every 21 days, with one heavy day. Her husband is status post vasectomy.  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 Her husband [name] is one of our cardiologists. The patient attends [employer] DIRECTIVES:    HEALTH MAINTENANCE: Social History   Tobacco Use  . Smoking status: Never Smoker  . Smokeless tobacco: Never Used  Substance Use Topics  . Alcohol use: No    Alcohol/week: 0.0 - 0.6 oz  . Drug use: No     Colonoscopy:  PAP:  Bone density: 11/25/2017 showed a T score of -2.0 osteopenic   Allergies  Allergen Reactions  . Peach Flavor Anaphylaxis  . Fentanyl Itching  . Penicillins Hives    AS A CHILD  . Sulfa Antibiotics Nausea And Vomiting  . Sulfasalazine Nausea And Vomiting and Other (See Comments)  . Latex Itching    Current Outpatient Medications  Medication Sig Dispense Refill  . acetaminophen (TYLENOL) 325 MG tablet Take 650 mg by mouth every 6 (six) hours as needed.    [name] anastrozole (ARIMIDEX) 1 MG tablet Take 1 tablet (1 mg total) by mouth daily. 90 tablet 4  . Calcium Carbonate (CALCIUM 600 PO) Take 1 capsule by mouth daily.     [name] denosumab (PROLIA) 60 MG/ML SOSY injection Inject 60 mg into the skin every 6 (six) months.    . Evolocumab (REPATHA SURECLICK) 235 MG/ML SOAJ Inject 1 pen into the skin every 14 (fourteen) days. 2 pen 11  . fluticasone (FLONASE) 50 MCG/ACT nasal spray Place 1 spray into both nostrils daily as needed.    [name] GARCINIA CAMBOGIA-CHROMIUM PO Take 1 capsule by mouth daily.     . Goserelin Acetate (ZOLADEX [place]) Inject 1 Dose into the skin every 30 (thirty) days.    . Linoleic Acid Conjugated 1000 MG CAPS Take 1 capsule by mouth daily.     . Multiple Vitamins-Minerals (MULTIVITAMIN WOMEN 50+) TABS Take 1 tablet by mouth daily.     . Pitavastatin Calcium 2 MG TABS Take 1 tablet (2 mg total) by mouth 2 (two) times a week. Take 1 tablet on Mondays and Thursdays 30 tablet 6  . venlafaxine XR (EFFEXOR-XR) 75 MG 24 hr capsule Take 1 capsule (75 mg total) by mouth daily with breakfast. 90 capsule 3   No current  facility-administered medications for this visit.    Facility-Administered Medications Ordered in Other Visits  Medication Dose Route Frequency Provider Last Rate Last Dose  . goserelin (ZOLADEX) injection 3.6 mg  3.6 mg Subcutaneous Once Magrinat, Virgie Dad, MD        OBJECTIVE: Young white woman who appears stated age  36:   05/01/18 0946  BP: 102/64  Pulse: 79  Resp: 18  Temp: 98.1 F (36.7 C)  SpO2: 99%     Body mass index is 25.13 kg/m.    ECOG FS:1 - Symptomatic but  completely ambulatory   Sclerae unicteric, EOMs intact Oropharynx clear and moist No cervical or supraclavicular adenopathy Lungs no rales or rhonchi Heart regular rate and rhythm Abd soft, nontender, positive bowel sounds MSK no focal spinal tenderness, no upper extremity lymphedema Neuro: nonfocal, well oriented, appropriate affect Breasts: Status post bilateral mastectomies with bilateral implant reconstruction.  No evidence of local recurrence.  Both axillae are benign.  LAB RESULTS:  CMP     Component Value Date/Time   NA 141 03/13/2018 0829   NA 140 08/19/2017 1337   K 3.6 03/13/2018 0829   K 3.6 08/19/2017 1337   CL 105 03/13/2018 0829   CO2 28 03/13/2018 0829   CO2 24 08/19/2017 1337   GLUCOSE 91 03/13/2018 0829   GLUCOSE 88 08/19/2017 1337   BUN 13 03/13/2018 0829   BUN 10.9 08/19/2017 1337   CREATININE 0.61 03/13/2018 0829   CREATININE 0.7 08/19/2017 1337   CALCIUM 9.0 03/13/2018 0829   CALCIUM 9.0 08/19/2017 1337   PROT 7.6 03/13/2018 0829   PROT 6.6 08/19/2017 1337   ALBUMIN 4.4 03/13/2018 0829   ALBUMIN 3.9 08/19/2017 1337   AST 24 03/13/2018 0829   AST 35 (H) 08/19/2017 1337   ALT 28 03/13/2018 0829   ALT 55 08/19/2017 1337   ALKPHOS 39 03/13/2018 0829   ALKPHOS 61 08/19/2017 1337   BILITOT 0.4 03/13/2018 0829   BILITOT 0.41 08/19/2017 1337   GFRNONAA >60 12/30/2017 1134   GFRAA >60 12/30/2017 1134    No results found for: Ronnald Ramp, A1GS, A2GS,  BETS, BETA2SER, GAMS, MSPIKE, SPEI  No results found for: Nils Pyle, [facility]  Lab Results  Component Value Date   WBC 4.6 12/30/2017   NEUTROABS 3.1 12/30/2017   HGB 14.4 12/30/2017   HCT 41.8 12/30/2017   MCV 95.4 12/30/2017   PLT 208 12/30/2017      Chemistry      Component Value Date/Time   NA 141 03/13/2018 0829   NA 140 08/19/2017 1337   K 3.6 03/13/2018 0829   K 3.6 08/19/2017 1337   CL 105 03/13/2018 0829   CO2 28 03/13/2018 0829   CO2 24 08/19/2017 1337   BUN 13 03/13/2018 0829   BUN 10.9 08/19/2017 1337   CREATININE 0.61 03/13/2018 0829   CREATININE 0.7 08/19/2017 1337      Component Value Date/Time   CALCIUM 9.0 03/13/2018 0829   CALCIUM 9.0 08/19/2017 1337   ALKPHOS 39 03/13/2018 0829   ALKPHOS 61 08/19/2017 1337   AST 24 03/13/2018 0829   AST 35 (H) 08/19/2017 1337   ALT 28 03/13/2018 0829   ALT 55 08/19/2017 1337   BILITOT 0.4 03/13/2018 0829   BILITOT 0.41 08/19/2017 1337       No results found for: LABCA2  No components found for: [id number]  No results for input(s): INR in the last 168 hours.  Urinalysis    Component Value Date/Time   COLORURINE YELLOW 08/26/2011 1652   APPEARANCEUR CLEAR 08/26/2011 1652   LABSPEC 1.025 12/21/2013 1226   PHURINE 6.0 12/21/2013 1226   GLUCOSEU NEGATIVE 12/21/2013 1226   HGBUR TRACE (A) 12/21/2013 1226   BILIRUBINUR neg 07/12/2014 0913   KETONESUR NEGATIVE 12/21/2013 1226   PROTEINUR 4+ 07/12/2014 0913   PROTEINUR NEGATIVE 12/21/2013 1226   UROBILINOGEN negative 07/12/2014 0913   UROBILINOGEN 0.2 12/21/2013 1226   NITRITE neg 07/12/2014 0913   NITRITE NEGATIVE 12/21/2013 1226   LEUKOCYTESUR small (1+) 07/12/2014 0913     STUDIES: Since  her last visit here she underwent a CT pelvis without contrast on 02/22/2018, showing a cause for the patient's symptoms is not identified. Sclerotic lesions in the right iliac bone and right acetabulum are stable from 2015 and accordingly unlikely to  represent active malignancy. 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 Bilateral common iliac artery atherosclerotic calcification.  She also underwent a CT cardiac scoring on 03/30/2018, showing clustered nodules in the inferior right upper lobe and adjacent superior right middle lobe, likely inflammatory.  ELIGIBLE FOR AVAILABLE RESEARCH PROTOCOL: no  ASSESSMENT: 42 y.o. [place] woman status post right breast upper outer quadrant biopsy 02/08/2017 for a clinical T2 N0, stage IB invasive ductal carcinoma, grade 2, estrogen and progesterone receptor positive, HER-2 nonamplified, with an MIB-1 of 5%.  (a) biopsy of upper outer quadrant calcifications in the left breast 02/09/2017 were benign  (1) Oncotype DX obtained from the biopsy sample showed a recurrence score of 11 predicting a 10 year risk of recurrence outside the breast of 9% if the patient's only systemic therapy is tamoxifen for 5 years (node positive report)  (2) bilateral mastectomies with right sentinel lymph node sampling 02/28/2017 showed  (a) on the left, intraductal papilloma, with no evidence of malignancy.  (b) on the right, a pT2 pN1 stage IIA invasive ductal carcinoma, grade 1, with close margins  (c) additional excision of the right nipple areolar area 03/15/2017 found no residual carcinoma  (3) Mammaprint study on the final surgical sample came back low risk, predicting a five-year distant disease-free survival of 97.8% with hormone therapy alone   (3) tamoxifen started neoadjuvantly 02/11/2017, Stopped 04/11/2017 in preparation for chemotherapy  (4) genetics counseling 02/19/2017 showed no deleterious mutations in the STAT gene panel offered by [facility] including ATM, BRCA1, BRCA2, CDH1, CHEK2, PALB2, PTEN, STK11, and TP53.  (a) multi gene  panel February 21, 2017 through the  Multi-Gene Panel offered by [facility] found no deleterious mutations in ALK, APC, ATM, AXIN2,BAP1,  BARD1, BLM, BMPR1A, BRCA1, BRCA2, BRIP1, CASR, CDC73, CDH1, CDK4, CDKN1B, CDKN1C, CDKN2A (p14ARF), CDKN2A (p16INK4a), CEBPA, CHEK2, CTNNA1, DICER1, DIS3L2, EGFR (c.2369C>T, p.Thr790Met variant only), EPCAM (Deletion/duplication testing only), FH, FLCN, GATA2, GPC3, GREM1 (Promoter region deletion/duplication testing only), HOXB13 (c.251G>A, p.Gly84Glu), HRAS, KIT, MAX, MEN1, MET, MITF (c.952G>A, p.Glu318Lys variant only), MLH1, MSH2, MSH3, MSH6, MUTYH, NBN, NF1, NF2, NTHL1, PALB2, PDGFRA, PHOX2B, PMS2, POLD1, POLE, POT1, PRKAR1A, PTCH1, PTEN, RAD50, RAD51C, RAD51D, RB1, RECQL4, RET, RUNX1, SDHAF2, SDHA (sequence changes only), SDHB, SDHC, SDHD, SMAD4, SMARCA4, SMARCB1, SMARCE1, STK11, SUFU, TERT, TERT, TMEM127, TP53, TSC1, TSC2, VHL, WRN and WT1.   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  (9) significant hypercholesterolemia   PLAN: Katelyn Shelton is now just over a year out from definitive surgery for her breast cancer with no evidence of disease recurrence.  This is favorable.  She is tolerating anastrozole moderately well.  It has been very confusing because she was also started on Crestor about  the same time and the symptoms certainly can overlap.  The plan at this point however is to continue the anastrozole a minimum of 5 years.  She is receiving goserelin  today and next month.  After that she is planning to have a hysterectomy and bilateral salpingo-oophorectomy so goserelin will no longer be necessary.  She is discussing reconstruction issues with [name] tomorrow.  At this point Katelyn Shelton is not planning to pursue a D IEP.  I reassured her that the findings in her chest CT scan and her left lateral cutaneous nerve neuropathy are not related to breast cancer.  With regards to the latter I suggested she try gabapentin at bedtime and see if that improves the thigh issue.  She understands the average weight gain with menopause is between 15 and 25 pounds and that if she does not want to gain weight that she will need to avoid carbs.  Since she is having difficulty with the treadmill and elliptical I suggested she try the stationary bike  She has multiple appointments with multiple physicians so she will not see me again until March 2019, which will be the time of her Prolia shot  She knows to call for any other issues that may develop before then.   Magrinat, Virgie Dad, MD  05/01/18 10:17 AM Medical Oncology and Hematology [facility] [address] [place], [place] [address] Tel. [phone]    Fax. [phone]  I, Margit Banda am acting as a scribe for Chauncey Cruel, MD.   I, Lurline Del MD, have reviewed the above documentation for accuracy and completeness, and I agree with the above.

## 2018-05-01 ENCOUNTER — Telehealth: Payer: Self-pay | Admitting: Oncology

## 2018-05-01 ENCOUNTER — Inpatient Hospital Stay (HOSPITAL_BASED_OUTPATIENT_CLINIC_OR_DEPARTMENT_OTHER): Payer: 59 | Admitting: Oncology

## 2018-05-01 ENCOUNTER — Inpatient Hospital Stay: Payer: 59 | Attending: Oncology

## 2018-05-01 VITALS — BP 102/64 | HR 79 | Temp 98.1°F | Resp 18 | Ht 60.0 in | Wt 128.7 lb

## 2018-05-01 DIAGNOSIS — R232 Flushing: Secondary | ICD-10-CM | POA: Diagnosis not present

## 2018-05-01 DIAGNOSIS — Z79811 Long term (current) use of aromatase inhibitors: Secondary | ICD-10-CM | POA: Insufficient documentation

## 2018-05-01 DIAGNOSIS — M8580 Other specified disorders of bone density and structure, unspecified site: Secondary | ICD-10-CM | POA: Insufficient documentation

## 2018-05-01 DIAGNOSIS — Z923 Personal history of irradiation: Secondary | ICD-10-CM | POA: Insufficient documentation

## 2018-05-01 DIAGNOSIS — Z803 Family history of malignant neoplasm of breast: Secondary | ICD-10-CM | POA: Diagnosis not present

## 2018-05-01 DIAGNOSIS — Z79899 Other long term (current) drug therapy: Secondary | ICD-10-CM | POA: Insufficient documentation

## 2018-05-01 DIAGNOSIS — Z800 Family history of malignant neoplasm of digestive organs: Secondary | ICD-10-CM | POA: Insufficient documentation

## 2018-05-01 DIAGNOSIS — Z9013 Acquired absence of bilateral breasts and nipples: Secondary | ICD-10-CM | POA: Diagnosis not present

## 2018-05-01 DIAGNOSIS — F419 Anxiety disorder, unspecified: Secondary | ICD-10-CM | POA: Diagnosis not present

## 2018-05-01 DIAGNOSIS — E7800 Pure hypercholesterolemia, unspecified: Secondary | ICD-10-CM | POA: Diagnosis not present

## 2018-05-01 DIAGNOSIS — Z79818 Long term (current) use of other agents affecting estrogen receptors and estrogen levels: Secondary | ICD-10-CM | POA: Insufficient documentation

## 2018-05-01 DIAGNOSIS — C50411 Malignant neoplasm of upper-outer quadrant of right female breast: Secondary | ICD-10-CM

## 2018-05-01 DIAGNOSIS — Z8614 Personal history of Methicillin resistant Staphylococcus aureus infection: Secondary | ICD-10-CM | POA: Diagnosis not present

## 2018-05-01 DIAGNOSIS — Z170 Estrogen receptor positive status [ER+]: Secondary | ICD-10-CM | POA: Insufficient documentation

## 2018-05-01 MED ORDER — GABAPENTIN 300 MG PO CAPS: 300.0000 mg | ORAL_CAPSULE | Freq: Every day | ORAL | 4 refills | Status: DC

## 2018-05-01 MED ORDER — GOSERELIN ACETATE 3.6 MG ~~LOC~~ IMPL
DRUG_IMPLANT | SUBCUTANEOUS | Status: AC
  Filled 2018-05-01: qty 3.6

## 2018-05-01 MED ORDER — GOSERELIN ACETATE 3.6 MG ~~LOC~~ IMPL
3.6000 mg | DRUG_IMPLANT | Freq: Once | SUBCUTANEOUS | Status: AC
  Administered 2018-05-01: 3.6 mg via SUBCUTANEOUS

## 2018-05-01 NOTE — Addendum Note (Signed)
Addended by: Erskine Emery on: 05/01/2018 02:40 PM   Modules accepted: Orders

## 2018-05-01 NOTE — Telephone Encounter (Signed)
Patient declined calendar °

## 2018-05-01 NOTE — Patient Instructions (Signed)
Goserelin injection What is this medicine? GOSERELIN (GOE se rel in) is similar to a hormone found in the body. It lowers the amount of sex hormones that the body makes. Men will have lower testosterone levels and women will have lower estrogen levels while taking this medicine. In men, this medicine is used to treat prostate cancer; the injection is either given once per month or once every 12 weeks. A once per month injection (only) is used to treat women with endometriosis, dysfunctional uterine bleeding, or advanced breast cancer. This medicine may be used for other purposes; ask your health care provider or pharmacist if you have questions. COMMON BRAND NAME(S): Zoladex What should I tell my health care provider before I take this medicine? They need to know if you have any of these conditions (some only apply to women): -diabetes -heart disease or previous heart attack -high blood pressure -high cholesterol -kidney disease -osteoporosis or low bone density -problems passing urine -spinal cord injury -stroke -tobacco smoker -an unusual or allergic reaction to goserelin, hormone therapy, other medicines, foods, dyes, or preservatives -pregnant or trying to get pregnant -breast-feeding How should I use this medicine? This medicine is for injection under the skin. It is given by a health care professional in a hospital or clinic setting. Men receive this injection once every 4 weeks or once every 12 weeks. Women will only receive the once every 4 weeks injection. Talk to your pediatrician regarding the use of this medicine in children. Special care may be needed. Overdosage: If you think you have taken too much of this medicine contact a poison control center or emergency room at once. NOTE: This medicine is only for you. Do not share this medicine with others. What if I miss a dose? It is important not to miss your dose. Call your doctor or health care professional if you are unable to  keep an appointment. What may interact with this medicine? -female hormones like estrogen -herbal or dietary supplements like black cohosh, chasteberry, or DHEA -female hormones like testosterone -prasterone This list may not describe all possible interactions. Give your health care provider a list of all the medicines, herbs, non-prescription drugs, or dietary supplements you use. Also tell them if you smoke, drink alcohol, or use illegal drugs. Some items may interact with your medicine. What should I watch for while using this medicine? Visit your doctor or health care professional for regular checks on your progress. Your symptoms may appear to get worse during the first weeks of this therapy. Tell your doctor or healthcare professional if your symptoms do not start to get better or if they get worse after this time. Your bones may get weaker if you take this medicine for a long time. If you smoke or frequently drink alcohol you may increase your risk of bone loss. A family history of osteoporosis, chronic use of drugs for seizures (convulsions), or corticosteroids can also increase your risk of bone loss. Talk to your doctor about how to keep your bones strong. This medicine should stop regular monthly menstration in women. Tell your doctor if you continue to menstrate. Women should not become pregnant while taking this medicine or for 12 weeks after stopping this medicine. Women should inform their doctor if they wish to become pregnant or think they might be pregnant. There is a potential for serious side effects to an unborn child. Talk to your health care professional or pharmacist for more information. Do not breast-feed an infant while taking   this medicine. Men should inform their doctors if they wish to father a child. This medicine may lower sperm counts. Talk to your health care professional or pharmacist for more information. What side effects may I notice from receiving this  medicine? Side effects that you should report to your doctor or health care professional as soon as possible: -allergic reactions like skin rash, itching or hives, swelling of the face, lips, or tongue -bone pain -breathing problems -changes in vision -chest pain -feeling faint or lightheaded, falls -fever, chills -pain, swelling, warmth in the leg -pain, tingling, numbness in the hands or feet -signs and symptoms of low blood pressure like dizziness; feeling faint or lightheaded, falls; unusually weak or tired -stomach pain -swelling of the ankles, feet, hands -trouble passing urine or change in the amount of urine -unusually high or low blood pressure -unusually weak or tired Side effects that usually do not require medical attention (report to your doctor or health care professional if they continue or are bothersome): -change in sex drive or performance -changes in breast size in both males and females -changes in emotions or moods -headache -hot flashes -irritation at site where injected -loss of appetite -skin problems like acne, dry skin -vaginal dryness This list may not describe all possible side effects. Call your doctor for medical advice about side effects. You may report side effects to FDA at 1-800-FDA-1088. Where should I keep my medicine? This drug is given in a hospital or clinic and will not be stored at home. NOTE: This sheet is a summary. It may not cover all possible information. If you have questions about this medicine, talk to your doctor, pharmacist, or health care provider.  2018 Elsevier/Gold Standard (2013-12-18 11:10:35)  

## 2018-05-01 NOTE — Telephone Encounter (Signed)
Pt in office today for first Repatha injection. She demonstrates appropriate injection technique. Labs ordered for after 4th dose.

## 2018-05-02 ENCOUNTER — Inpatient Hospital Stay: Payer: 59

## 2018-05-02 ENCOUNTER — Inpatient Hospital Stay: Payer: 59 | Admitting: Genetics

## 2018-05-03 DIAGNOSIS — Z9013 Acquired absence of bilateral breasts and nipples: Secondary | ICD-10-CM | POA: Diagnosis not present

## 2018-05-03 DIAGNOSIS — Z923 Personal history of irradiation: Secondary | ICD-10-CM | POA: Diagnosis not present

## 2018-05-03 DIAGNOSIS — Z853 Personal history of malignant neoplasm of breast: Secondary | ICD-10-CM | POA: Diagnosis not present

## 2018-05-04 ENCOUNTER — Ambulatory Visit (INDEPENDENT_AMBULATORY_CARE_PROVIDER_SITE_OTHER): Payer: 59 | Admitting: Licensed Clinical Social Worker

## 2018-05-04 DIAGNOSIS — F324 Major depressive disorder, single episode, in partial remission: Secondary | ICD-10-CM

## 2018-05-10 ENCOUNTER — Telehealth: Payer: Self-pay | Admitting: Cardiology

## 2018-05-10 MED FILL — REPATHA 140 MG/1 ML INJ: 140 | 28 days supply | Qty: 2 | Fill #0

## 2018-05-10 NOTE — Telephone Encounter (Signed)
Pt wanted to know if Dr. Meda Coffee felt like she needed to be seen Friday or if it would be ok to postpone?  Pt prefers to be seen in Sept after getting her repeat labs.  Advised I would send to Dr. Meda Coffee to see if this is ok.

## 2018-05-10 NOTE — Telephone Encounter (Signed)
I would postpone until after labs, please schedule her in September, you can overbook me like on my imaging day at the end of the day.

## 2018-05-10 NOTE — Telephone Encounter (Signed)
New Message    Patient is calling to confirm whether or not she needs to keep her appointment on Friday. She did not start the Repatha until after her last appointment and then she is due to have labs drawn late August. Please call to discuss.

## 2018-05-11 NOTE — Telephone Encounter (Signed)
Mack Guise with [facility] is calling the pt back now to schedule her to see Dr Meda Coffee, at a different date as requested.

## 2018-05-11 NOTE — Telephone Encounter (Signed)
Follow up    Patient is calling back because she can not come on 9/5 but can she be worked in on the 9/2 or 9/3. Please call.

## 2018-05-11 NOTE — Telephone Encounter (Signed)
Pts appt with Dr Meda Coffee is scheduled for 07/20/18 at 1040.  Pt preferred this date.  Pt made aware of appt date and time by [facility] scheduling.

## 2018-05-12 ENCOUNTER — Ambulatory Visit: Payer: Self-pay | Admitting: Cardiology

## 2018-05-16 ENCOUNTER — Ambulatory Visit: Payer: Self-pay | Admitting: Family Medicine

## 2018-05-16 ENCOUNTER — Encounter: Payer: Self-pay | Admitting: Family Medicine

## 2018-05-16 ENCOUNTER — Ambulatory Visit: Payer: 59 | Admitting: Family Medicine

## 2018-05-16 VITALS — BP 110/72 | HR 65 | Temp 98.1°F | Ht 60.0 in | Wt 129.8 lb

## 2018-05-16 DIAGNOSIS — R300 Dysuria: Secondary | ICD-10-CM

## 2018-05-16 DIAGNOSIS — R21 Rash and other nonspecific skin eruption: Secondary | ICD-10-CM | POA: Diagnosis not present

## 2018-05-16 LAB — CBC WITH DIFFERENTIAL/PLATELET
Basophils Absolute: 0.1 10*3/uL (ref 0.0–0.1)
Basophils Relative: 1 % (ref 0.0–3.0)
EOS PCT: 1.7 % (ref 0.0–5.0)
Eosinophils Absolute: 0.1 10*3/uL (ref 0.0–0.7)
HCT: 41.2 % (ref 36.0–46.0)
HEMOGLOBIN: 14.3 g/dL (ref 12.0–15.0)
LYMPHS ABS: 1.2 10*3/uL (ref 0.7–4.0)
Lymphocytes Relative: 20.8 % (ref 12.0–46.0)
MCHC: 34.8 g/dL (ref 30.0–36.0)
MCV: 94.1 fl (ref 78.0–100.0)
MONO ABS: 0.5 10*3/uL (ref 0.1–1.0)
MONOS PCT: 9 % (ref 3.0–12.0)
Neutro Abs: 3.9 10*3/uL (ref 1.4–7.7)
Neutrophils Relative %: 67.5 % (ref 43.0–77.0)
Platelets: 220 10*3/uL (ref 150.0–400.0)
RBC: 4.37 Mil/uL (ref 3.87–5.11)
RDW: 12.8 % (ref 11.5–15.5)
WBC: 5.7 10*3/uL (ref 4.0–10.5)

## 2018-05-16 LAB — POCT URINALYSIS DIPSTICK
Bilirubin, UA: NEGATIVE
Blood, UA: NEGATIVE
GLUCOSE UA: NEGATIVE
KETONES UA: NEGATIVE
Nitrite, UA: NEGATIVE
Protein, UA: NEGATIVE
SPEC GRAV UA: 1.01 (ref 1.010–1.025)
Urobilinogen, UA: 0.2 E.U./dL
pH, UA: 7 (ref 5.0–8.0)

## 2018-05-16 MED ORDER — NITROFURANTOIN MONOHYD MACRO 100 MG PO CAPS: 100.0000 mg | ORAL_CAPSULE | Freq: Two times a day (BID) | ORAL | 0 refills | Status: DC

## 2018-05-16 NOTE — Progress Notes (Signed)
Subjective:  Katelyn Shelton is a 42 y.o. year old very pleasant female patient who presents for/with See problem oriented charting ROS- see ROS below   Past Medical History-  Patient Active Problem List   Diagnosis Date Noted  . Malignant neoplasm of upper-outer quadrant of right breast in female, estrogen receptor positive ([place]) 02/11/2017    Priority: High  . Hyperlipidemia, familial, high LDL 01/16/2010    Priority: High  . Arthralgia 02/24/2018    Priority: Medium  . Enchondroma of bone 02/24/2018    Priority: Medium  . Anxiety     Priority: Medium  . Hidradenitis suppurativa 08/26/2011    Priority: Medium  . Family history of colonic polyps 02/24/2018    Priority: Low  . Genetic testing 02/21/2017    Priority: Low  . Family history of stomach cancer     Priority: Low  . Lesion of lung 04/05/2018  . Abnormal CT scan of lung 04/05/2018  . History of cancer 04/05/2018    Medications- reviewed and updated Current Outpatient Medications  Medication Sig Dispense Refill  . Evolocumab 140 MG/ML SOAJ Inject into the skin.    [name] acetaminophen (TYLENOL) 325 MG tablet Take 650 mg by mouth every 6 (six) hours as needed.    [name] anastrozole (ARIMIDEX) 1 MG tablet Take 1 tablet (1 mg total) by mouth daily. 90 tablet 4  . Calcium Carbonate (CALCIUM 600 PO) Take 1 capsule by mouth daily.     [name] denosumab (PROLIA) 60 MG/ML SOSY injection Inject 60 mg into the skin every 6 (six) months.    . Evolocumab (REPATHA SURECLICK) 497 MG/ML SOAJ Inject 1 pen into the skin every 14 (fourteen) days. 2 pen 11  . fluticasone (FLONASE) 50 MCG/ACT nasal spray Place 1 spray into both nostrils daily as needed.    . gabapentin (NEURONTIN) 300 MG capsule Take 1 capsule (300 mg total) by mouth at bedtime. 90 capsule 4  . GARCINIA CAMBOGIA-CHROMIUM PO Take 1 capsule by mouth daily.     . Goserelin Acetate (ZOLADEX [place]) Inject 1 Dose into the skin every 30 (thirty) days.    . Linoleic Acid Conjugated 1000 MG CAPS  Take 1 capsule by mouth daily.     . Multiple Vitamins-Minerals (MULTIVITAMIN WOMEN 50+) TABS Take 1 tablet by mouth daily.     . nitrofurantoin, macrocrystal-monohydrate, (MACROBID) 100 MG capsule Take 1 capsule (100 mg total) by mouth 2 (two) times daily. 14 capsule 0  . Pitavastatin Calcium 2 MG TABS Take 1 tablet (2 mg total) by mouth 2 (two) times a week. Take 1 tablet on Mondays and Thursdays 30 tablet 6  . venlafaxine XR (EFFEXOR-XR) 75 MG 24 hr capsule Take 1 capsule (75 mg total) by mouth daily with breakfast. 90 capsule 3   No current facility-administered medications for this visit.     Objective: BP 110/72 (BP Location: Left Arm, Patient Position: Sitting, Cuff Size: Normal)   Pulse 65   Temp 98.1 F (36.7 C) (Oral)   Ht 5' (1.524 m)   Wt 129 lb 12.8 oz (58.9 kg)   SpO2 98%   BMI 25.35 kg/m  Gen: NAD, resting comfortably CV: RRR  Lungs: nonlabored, normal respiratory rate Abdomen: soft/nondistended. No suprapubic or CVA tenderness Ext: no edema Skin: warm, dry, 2 red slightly raised lesions on right lower abdomen under 7mm- do not blanche. She has several other pinhead size red flat lesions nonblanching on her right and left abdomen.   Results for orders placed  or performed in visit on 05/16/18 (from the past 24 hour(s))  POCT Urinalysis Dipstick     Status: Abnormal   Collection Time: 05/16/18 11:29 AM  Result Value Ref Range   Color, UA Yellow    Clarity, UA Clear    Glucose, UA Negative Negative   Bilirubin, UA Negative    Ketones, UA Negative    Spec Grav, UA 1.010 1.010 - 1.025   Blood, UA Negative    pH, UA 7.0 5.0 - 8.0   Protein, UA Negative Negative   Urobilinogen, UA 0.2 0.2 or 1.0 E.U./dL   Nitrite, UA Negative    Leukocytes, UA Moderate (2+) (A) Negative   Appearance     Odor     Assessment/Plan:  Concern for UTI S: had a yeast infection- was treated with diflucan with 3 pills. Patients symptoms started Sunday- the itching has cleared but after  that still noted burning with peeing.  Complains of dysuria: yes; polyuria: slightly; nocturia: yes; urgency: yes.  Symptoms are slightly better today.  ROS- no fever, chills, nausea, vomiting, flank pain. No blood in urine.  A/P: UA concerning for potential UTI. Will get culture. Empiric treatment with: nitrofurantoin/macrobid. We cant use keflex due to PCN allergy with hives and has nausea/vomiting on sulfa antibiotics so avoiding bactrim Patient to follow up if new or worsening symptoms or failure to improve.   Rash  S: Monday stung by bee on back- yesterday noted  Red bumps on abdomen- not itchy. Several very small spots. Non blanching. She is worried about petechiae or medication reaction. Denies coughing/straining. Had similar lesions to these after taking aspirin in past. Also recently started repatha.  A/P: 2 main spots she mention look like cherry angioma to my view (slightly raised). She is firm they are not this and believes them to be petechiae. We will check a cbc to evaluate platelets. Could be drug reaction to one of new medicines- given how mild it is- we opted to monitor for now. Her father wants her to call oncology and I agreed that's reasonable if she had worsening # or size of lesions  Future Appointments  Date Time Provider Lastrup  05/31/2018  9:00 AM [facility] [place] None  06/19/2018 10:30 AM [facility] PAT 1 [facility] None  06/22/2018  8:30 AM [facility]  07/06/2018  9:30 AM [facility] LAB 6 [facility] None  07/06/2018 10:00 AM Magrinat, Virgie Dad, MD [facility] None  07/06/2018 11:00 AM [facility] [place] FLUSH [facility] None  07/20/2018 10:40 AM Dorothy Spark, MD [facility]  10/10/2018  9:00 AM [facility] 1 [facility]  01/04/2019  9:30 AM [facility] LAB 1 [facility] None  01/04/2019 10:00 AM Magrinat, Virgie Dad, MD [facility] None  01/04/2019 11:00 AM [facility] [place] FLUSH [facility] None   Lab/Order  associations: Dysuria - Plan: POCT Urinalysis Dipstick, Urine Culture, Urine Culture  Rash - Plan: CBC with Differential/Platelet, CANCELED: CBC with Differential/Platelet  Meds ordered this encounter  Medications  . nitrofurantoin, macrocrystal-monohydrate, (MACROBID) 100 MG capsule    Sig: Take 1 capsule (100 mg total) by mouth 2 (two) times daily.    Dispense:  14 capsule    Refill:  0    Return precautions advised.  Garret Reddish, MD

## 2018-05-16 NOTE — Patient Instructions (Addendum)
Please stop by lab before you go  UA concerning for potential UTI. Will get culture. Empiric treatment with: nitrofurantoin/macrobid. We cant use keflex due to PCN allergy with hives and has nausea/vomiting on sulfa antibiotics so avoiding bactrim Patient to follow up if new or worsening symptoms or failure to improve.

## 2018-05-18 LAB — URINE CULTURE
MICRO NUMBER:: 90870354
SPECIMEN QUALITY: ADEQUATE

## 2018-05-31 ENCOUNTER — Inpatient Hospital Stay: Payer: 59 | Attending: Oncology

## 2018-05-31 VITALS — BP 120/74 | HR 82 | Temp 98.2°F | Resp 20

## 2018-05-31 DIAGNOSIS — C50411 Malignant neoplasm of upper-outer quadrant of right female breast: Secondary | ICD-10-CM | POA: Insufficient documentation

## 2018-05-31 DIAGNOSIS — Z79818 Long term (current) use of other agents affecting estrogen receptors and estrogen levels: Secondary | ICD-10-CM | POA: Insufficient documentation

## 2018-05-31 DIAGNOSIS — Z170 Estrogen receptor positive status [ER+]: Secondary | ICD-10-CM | POA: Diagnosis not present

## 2018-05-31 MED ORDER — GOSERELIN ACETATE 3.6 MG ~~LOC~~ IMPL
3.6000 mg | DRUG_IMPLANT | Freq: Once | SUBCUTANEOUS | Status: AC
  Administered 2018-05-31: 3.6 mg via SUBCUTANEOUS

## 2018-05-31 MED ORDER — GOSERELIN ACETATE 3.6 MG ~~LOC~~ IMPL
DRUG_IMPLANT | SUBCUTANEOUS | Status: AC
  Filled 2018-05-31: qty 3.6

## 2018-05-31 NOTE — Patient Instructions (Signed)
Goserelin injection What is this medicine? GOSERELIN (GOE se rel in) is similar to a hormone found in the body. It lowers the amount of sex hormones that the body makes. Men will have lower testosterone levels and women will have lower estrogen levels while taking this medicine. In men, this medicine is used to treat prostate cancer; the injection is either given once per month or once every 12 weeks. A once per month injection (only) is used to treat women with endometriosis, dysfunctional uterine bleeding, or advanced breast cancer. This medicine may be used for other purposes; ask your health care provider or pharmacist if you have questions. COMMON BRAND NAME(S): Zoladex What should I tell my health care provider before I take this medicine? They need to know if you have any of these conditions (some only apply to women): -diabetes -heart disease or previous heart attack -high blood pressure -high cholesterol -kidney disease -osteoporosis or low bone density -problems passing urine -spinal cord injury -stroke -tobacco smoker -an unusual or allergic reaction to goserelin, hormone therapy, other medicines, foods, dyes, or preservatives -pregnant or trying to get pregnant -breast-feeding How should I use this medicine? This medicine is for injection under the skin. It is given by a health care professional in a hospital or clinic setting. Men receive this injection once every 4 weeks or once every 12 weeks. Women will only receive the once every 4 weeks injection. Talk to your pediatrician regarding the use of this medicine in children. Special care may be needed. Overdosage: If you think you have taken too much of this medicine contact a poison control center or emergency room at once. NOTE: This medicine is only for you. Do not share this medicine with others. What if I miss a dose? It is important not to miss your dose. Call your doctor or health care professional if you are unable to  keep an appointment. What may interact with this medicine? -female hormones like estrogen -herbal or dietary supplements like black cohosh, chasteberry, or DHEA -female hormones like testosterone -prasterone This list may not describe all possible interactions. Give your health care provider a list of all the medicines, herbs, non-prescription drugs, or dietary supplements you use. Also tell them if you smoke, drink alcohol, or use illegal drugs. Some items may interact with your medicine. What should I watch for while using this medicine? Visit your doctor or health care professional for regular checks on your progress. Your symptoms may appear to get worse during the first weeks of this therapy. Tell your doctor or healthcare professional if your symptoms do not start to get better or if they get worse after this time. Your bones may get weaker if you take this medicine for a long time. If you smoke or frequently drink alcohol you may increase your risk of bone loss. A family history of osteoporosis, chronic use of drugs for seizures (convulsions), or corticosteroids can also increase your risk of bone loss. Talk to your doctor about how to keep your bones strong. This medicine should stop regular monthly menstration in women. Tell your doctor if you continue to menstrate. Women should not become pregnant while taking this medicine or for 12 weeks after stopping this medicine. Women should inform their doctor if they wish to become pregnant or think they might be pregnant. There is a potential for serious side effects to an unborn child. Talk to your health care professional or pharmacist for more information. Do not breast-feed an infant while taking   this medicine. Men should inform their doctors if they wish to father a child. This medicine may lower sperm counts. Talk to your health care professional or pharmacist for more information. What side effects may I notice from receiving this  medicine? Side effects that you should report to your doctor or health care professional as soon as possible: -allergic reactions like skin rash, itching or hives, swelling of the face, lips, or tongue -bone pain -breathing problems -changes in vision -chest pain -feeling faint or lightheaded, falls -fever, chills -pain, swelling, warmth in the leg -pain, tingling, numbness in the hands or feet -signs and symptoms of low blood pressure like dizziness; feeling faint or lightheaded, falls; unusually weak or tired -stomach pain -swelling of the ankles, feet, hands -trouble passing urine or change in the amount of urine -unusually high or low blood pressure -unusually weak or tired Side effects that usually do not require medical attention (report to your doctor or health care professional if they continue or are bothersome): -change in sex drive or performance -changes in breast size in both males and females -changes in emotions or moods -headache -hot flashes -irritation at site where injected -loss of appetite -skin problems like acne, dry skin -vaginal dryness This list may not describe all possible side effects. Call your doctor for medical advice about side effects. You may report side effects to FDA at 1-800-FDA-1088. Where should I keep my medicine? This drug is given in a hospital or clinic and will not be stored at home. NOTE: This sheet is a summary. It may not cover all possible information. If you have questions about this medicine, talk to your doctor, pharmacist, or health care provider.  2018 Elsevier/Gold Standard (2013-12-18 11:10:35)  

## 2018-06-05 DIAGNOSIS — L218 Other seborrheic dermatitis: Secondary | ICD-10-CM | POA: Diagnosis not present

## 2018-06-05 DIAGNOSIS — D2262 Melanocytic nevi of left upper limb, including shoulder: Secondary | ICD-10-CM | POA: Diagnosis not present

## 2018-06-05 DIAGNOSIS — L918 Other hypertrophic disorders of the skin: Secondary | ICD-10-CM | POA: Diagnosis not present

## 2018-06-12 NOTE — Patient Instructions (Addendum)
Your procedure is scheduled on:  Thursday, 9/5  Enter through the Main Entrance of [facility] at: 6 am  Pick up the phone at the desk and dial [phone].  Call this number if you have problems the morning of surgery: [phone].  Remember: Do NOT eat food or Do NOT drink clear liquids (including water) after midnight Wednesday.  Take these medicines the morning of surgery with a SIP OF WATER: anastrozole, effexor xl and flonase if needed  Brush your teeth on the morning of surgery.  Stop herbal medications, vitamin supplements, Ibuprofen/NSAIDS 1 week prior to surgery.  Do NOT wear jewelry (body piercing), metal hair clips/bobby pins, make-up, or nail polish. Do NOT wear lotions, powders, or perfumes.  You may wear deoderant. Do NOT shave for 48 hours prior to surgery. Do NOT bring valuables to the hospital. Contacts may not be worn into surgery.  Leave suitcase in car.  After surgery it may be brought to your room.  For patients admitted to the hospital, checkout time is 11:00 AM the day of discharge. Home with to be determined prior to surgery.  Patient instructed that she can not go home by bus, [place] or taxi.

## 2018-06-19 ENCOUNTER — Other Ambulatory Visit: Payer: Self-pay

## 2018-06-19 ENCOUNTER — Encounter (HOSPITAL_COMMUNITY)
Admission: RE | Admit: 2018-06-19 | Discharge: 2018-06-19 | Disposition: A | Discharge status: Home / Self Care | Payer: 59 | Source: Ambulatory Visit | Attending: Obstetrics and Gynecology | Admitting: Obstetrics and Gynecology

## 2018-06-19 ENCOUNTER — Encounter (HOSPITAL_COMMUNITY): Payer: Self-pay | Admitting: *Deleted

## 2018-06-19 DIAGNOSIS — Z01818 Encounter for other preprocedural examination: Secondary | ICD-10-CM | POA: Diagnosis not present

## 2018-06-19 DIAGNOSIS — N939 Abnormal uterine and vaginal bleeding, unspecified: Secondary | ICD-10-CM | POA: Insufficient documentation

## 2018-06-19 DIAGNOSIS — R102 Pelvic and perineal pain: Secondary | ICD-10-CM | POA: Diagnosis not present

## 2018-06-19 LAB — CBC
HEMATOCRIT: 40.7 % (ref 36.0–46.0)
Hemoglobin: 14 g/dL (ref 12.0–15.0)
MCH: 33 pg (ref 26.0–34.0)
MCHC: 34.4 g/dL (ref 30.0–36.0)
MCV: 96 fL (ref 78.0–100.0)
PLATELETS: 204 10*3/uL (ref 150–400)
RBC: 4.24 MIL/uL (ref 3.87–5.11)
RDW: 12.9 % (ref 11.5–15.5)
WBC: 4.6 10*3/uL (ref 4.0–10.5)

## 2018-06-19 LAB — COMPREHENSIVE METABOLIC PANEL
ALBUMIN: 4.1 g/dL (ref 3.5–5.0)
ALT: 26 U/L (ref 0–44)
AST: 26 U/L (ref 15–41)
Alkaline Phosphatase: 38 U/L (ref 38–126)
Anion gap: 10 (ref 5–15)
BUN: 16 mg/dL (ref 6–20)
CHLORIDE: 106 mmol/L (ref 98–111)
CO2: 24 mmol/L (ref 22–32)
CREATININE: 0.53 mg/dL (ref 0.44–1.00)
Calcium: 9.2 mg/dL (ref 8.9–10.3)
GFR calc Af Amer: >60 mL/min (ref >60)
GLUCOSE: 102 mg/dL — AB (ref 70–99)
POTASSIUM: 3.7 mmol/L (ref 3.5–5.1)
Sodium: 140 mmol/L (ref 135–145)
Total Bilirubin: 0.7 mg/dL (ref 0.3–1.2)
Total Protein: 7.3 g/dL (ref 6.5–8.1)

## 2018-06-19 LAB — ABO/RH: ABO/RH(D): A POS

## 2018-06-19 LAB — TYPE AND SCREEN
ABO/RH(D): A POS
Antibody Screen: NEGATIVE

## 2018-06-21 MED FILL — ANASTROZOLE 1 MG TABLET: 1 | 90 days supply | Qty: 90 | Fill #3

## 2018-06-21 MED FILL — REPATHA 140 MG/1 ML INJ: 140 | 28 days supply | Qty: 2 | Fill #1

## 2018-06-22 ENCOUNTER — Other Ambulatory Visit: Payer: 59 | Admitting: *Deleted

## 2018-06-22 ENCOUNTER — Ambulatory Visit (INDEPENDENT_AMBULATORY_CARE_PROVIDER_SITE_OTHER): Payer: 59 | Admitting: Licensed Clinical Social Worker

## 2018-06-22 DIAGNOSIS — F324 Major depressive disorder, single episode, in partial remission: Secondary | ICD-10-CM

## 2018-06-22 DIAGNOSIS — R102 Pelvic and perineal pain: Secondary | ICD-10-CM | POA: Diagnosis not present

## 2018-06-22 DIAGNOSIS — E7849 Other hyperlipidemia: Secondary | ICD-10-CM

## 2018-06-22 DIAGNOSIS — N939 Abnormal uterine and vaginal bleeding, unspecified: Secondary | ICD-10-CM | POA: Diagnosis not present

## 2018-06-22 LAB — HEPATIC FUNCTION PANEL
ALT: 25 IU/L (ref 0–32)
AST: 25 IU/L (ref 0–40)
Albumin: 4.6 g/dL (ref 3.5–5.5)
Alkaline Phosphatase: 41 IU/L (ref 39–117)
Bilirubin Total: 0.3 mg/dL (ref 0.0–1.2)
Bilirubin, Direct: 0.1 mg/dL (ref 0.00–0.40)
Total Protein: 7 g/dL (ref 6.0–8.5)

## 2018-06-22 LAB — LIPID PANEL
Chol/HDL Ratio: 2.7 ratio (ref 0.0–4.4)
Cholesterol, Total: 154 mg/dL (ref 100–199)
HDL: 57 mg/dL (ref >39)
LDL Calculated: 75 mg/dL (ref 0–99)
Triglycerides: 110 mg/dL (ref 0–149)
VLDL Cholesterol Cal: 22 mg/dL (ref 5–40)

## 2018-06-23 ENCOUNTER — Telehealth: Payer: Self-pay | Admitting: Oncology

## 2018-06-23 ENCOUNTER — Ambulatory Visit: Payer: Self-pay | Admitting: Cardiology

## 2018-06-23 NOTE — Telephone Encounter (Signed)
GM PAL - per GM moved 9/12 lab/fu/inj to 9/5. Per patient due to surgery not able to come 9/5. Per patient she would like to keep prolia injection scheduled for 9/12 and see GM at next available when he returns - patient aware next available is November - ok per patient. Gave patient appointments for 9/12 injection and 11/26 lab/fu.

## 2018-06-28 ENCOUNTER — Encounter (HOSPITAL_COMMUNITY): Payer: Self-pay | Admitting: Anesthesiology

## 2018-06-28 NOTE — Anesthesia Preprocedure Evaluation (Addendum)
Anesthesia Evaluation  Patient identified by MRN, date of birth, ID band Patient awake    Reviewed: Allergy & Precautions, NPO status , Patient's Chart, lab work & pertinent test results  Airway Mallampati: I       Dental no notable dental hx. (+) Teeth Intact   Pulmonary neg pulmonary ROS,    Pulmonary exam normal breath sounds clear to auscultation       Cardiovascular negative cardio ROS Normal cardiovascular exam Rhythm:Regular Rate:Normal     Neuro/Psych    GI/Hepatic negative GI ROS, Neg liver ROS,   Endo/Other  negative endocrine ROS  Renal/GU negative Renal ROS  negative genitourinary   Musculoskeletal negative musculoskeletal ROS (+)   Abdominal Normal abdominal exam  (+)   Peds  Hematology negative hematology ROS (+)   Anesthesia Other Findings   Reproductive/Obstetrics                            Anesthesia Physical Anesthesia Plan  ASA: II  Anesthesia Plan: General   Post-op Pain Management:    Induction: Intravenous  PONV Risk Score and Plan: 4 or greater and Ondansetron, Dexamethasone, Midazolam and Scopolamine patch - Pre-op  Airway Management Planned: Oral ETT  Additional Equipment:   Intra-op Plan:   Post-operative Plan: Extubation in OR  Informed Consent: I have reviewed the patients History and Physical, chart, labs and discussed the procedure including the risks, benefits and alternatives for the proposed anesthesia with the patient or authorized representative who has indicated his/her understanding and acceptance.   Dental advisory given  Plan Discussed with: CRNA and Surgeon  Anesthesia Plan Comments:        Anesthesia Quick Evaluation

## 2018-06-28 NOTE — H&P (Addendum)
Katelyn Shelton is an 42 y.o. female  who goes by [facility], presents today for preop evaluation for LAVH and bilateral salpingo-oophorectomy.  She has a history of breast cancer, estrogen-receptor positive, is undergoing treatment for this.  She is currently on a GnRH agonist to make her menopausal, and they do recommend removal of both tubes and ovaries.  She has also been having some problems with abnormal uterine bleeding, underwent a saline infusion ultrasound which was normal and because of this and also pelvic pain, she desires a hysterectomy along with removal of both tubes and ovaries.  Her past medical history is also significant for elevated cholesterol, did not do well on statin so she is currently on Repatha; also osteoporosis, she is currently on Prolia; and she is currently on Zoladex.     No LMP recorded. Patient has had an injection.    Past Medical History:  Diagnosis Date  . Allergy   . Anxiety    venflafaxine XR 75 mg  . Breast cancer ([place]) 01/2017   right; genetic testing negative in 2018 with [employer] panel  . Heart murmur    as child- went away  . History of MRSA infection    elbow  . Hyperlipidemia   . Joint pain    due to medicine induced joint pain  . Migraine    otc med prn  . Miscarriage    x1  . Neuromuscular disorder ([place])    neuropathy bilater feet - ? 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 Osteopenia   . UTI (urinary tract infection)     Past Surgical History:  Procedure Laterality Date  . BREAST RECONSTRUCTION WITH PLACEMENT OF TISSUE EXPANDER AND FLEX HD (ACELLULAR HYDRATED DERMIS) Bilateral 02/28/2017   Procedure: BILATERAL BREAST RECONSTRUCTION WITH PLACEMENT OF TISSUE EXPANDER AND ALLODERM;  Surgeon: Irene Limbo, MD;  Location: [facility];  Service: Plastics;  Laterality: Bilateral;  . CESAREAN SECTION  2008,2010,2013   x 3  . CESAREAN SECTION  02/04/2012   Procedure: CESAREAN SECTION;  Surgeon: Cyril Mourning, MD;  Location: [facility];  Service: Gynecology;   Laterality: N/A;  Repeat Cesarean Section Delivery  Boy  @  [phone], Apgars 9/10  . COLONOSCOPY     polyp  . DILATION AND CURETTAGE OF UTERUS    . EXCISION OF BREAST LESION Right 03/15/2017   Procedure: RIGHT NIPPLE AND AREOLA EXCISION;  Surgeon: Rolm Bookbinder, MD;  Location: [facility];  Service: General;  Laterality: Right;  . NIPPLE SPARING MASTECTOMY Bilateral 02/28/2017   Procedure: RIGHT NIPPLE SPARING MASTECTOMY; LEFT PROPHYLACTIC NIPPLE SPARING MASTECTOMY;  Surgeon: Rolm Bookbinder, MD;  Location: [facility];  Service: General;  Laterality: Bilateral;  . PECTUS EXCAVATUM REPAIR  3831   42 years old- very large implant  . SENTINEL NODE BIOPSY Right 02/28/2017   Procedure: RIGHT AXILLARY SENTINEL LYMPH  NODE BIOPSY;  Surgeon: Rolm Bookbinder, MD;  Location: [facility];  Service: General;  Laterality: Right;  . VULVAR LESION REMOVAL    . WISDOM TOOTH EXTRACTION      Family History  Problem Relation Age of Onset  . Skin cancer Mother   . Hyperlipidemia Mother   . Hypertension Mother   . Colon polyps Mother   . Hyperlipidemia Father   . Parkinson's disease Sister 48       early on set  . Miscarriages / Stillbirths Sister   . Diabetes Maternal Aunt   . Colon polyps Maternal Uncle   .  Heart failure Maternal Grandmother   . Stroke Maternal Grandmother   . Heart attack Maternal Grandfather   . Depression Maternal Grandfather   . Early death Maternal Grandfather        36 MI  . Stomach cancer Paternal Grandmother   . Prostate cancer Paternal Grandfather   . Alzheimer's disease Paternal Grandfather   . Breast cancer Cousin        mother's paternal first FEMALE cousin  . Colon polyps Sister   . Colon cancer Neg Hx   . Rectal cancer Neg Hx     Social History:  reports that she has never smoked. She has never used smokeless tobacco. She reports that she drinks about 1.0 - 2.0 standard drinks of alcohol per week. She reports that she  does not use drugs.  Allergies:  Allergies  Allergen Reactions  . Fentanyl Itching    Severe itching - Patient refuses this medication  . Peach Flavor Anaphylaxis  . Penicillins Hives    AS A CHILD  . Sulfa Antibiotics Nausea And Vomiting  . Sulfasalazine Nausea And Vomiting and Other (See Comments)  . Latex Itching    No medications prior to admission.    ROS  There were no vitals taken for this visit. Physical Exam  Heart regular rate and rhythm.  Lungs clear to auscultation bilaterally.  Abdomen is nondistended, nontender.  Pelvic exam:  Some vaginal atrophy.  No significant cystocele or rectocele.  The uterus is approximately 6 weeks size.  A normal-appearing saline infusion ultrasound with normal ovaries, and no endometrial masses noted.    No results found for this or any previous visit (from the past 24 hour(s)).  No results found.  Assessment/Plan: Breast cancer, estrogen-receptor positive, in a perimenopausal woman.  Recommend removal of both tubes and ovaries.  Did discuss menopause at length, and she had her taste of this over the last several months on the Zoladex.  Pelvic pain and also abnormal uterine bleeding with a normal saline infusion ultrasound.  Because of this, she does want to proceed with a hysterectomy.  We will plan laparoscopically-assisted vaginal hysterectomy and bilateral salpingo-oophorectomy.  Discussed the procedure at length, its risks, its benefits.  She has had cesarean sections so we are not sure whether or not this will be able to be accomplished laparoscopically.  If not, we will perform a laparotomy.  Again discussed the procedure, its risks, its benefits, its pros, its cons and recovery at length.  She does give informed consent.  Louretta Shorten, MD    Jalissa Heinzelman C 06/28/2018, 5:34 PM   This patient has been seen and examined.   All of her questions were answered.  Labs and vital signs reviewed.  Informed consent has been obtained.  The History  and Physical is current. 06/29/18 0715 DL

## 2018-06-29 ENCOUNTER — Encounter (HOSPITAL_COMMUNITY)
Admission: RE | Disposition: A | Discharge status: Home / Self Care | Payer: Self-pay | Source: Ambulatory Visit | Attending: Obstetrics and Gynecology

## 2018-06-29 ENCOUNTER — Observation Stay (HOSPITAL_COMMUNITY)
Admission: RE | Admit: 2018-06-29 | Discharge: 2018-06-30 | Disposition: A | Discharge status: Home / Self Care | Payer: 59 | Source: Ambulatory Visit | Attending: Obstetrics and Gynecology | Admitting: Obstetrics and Gynecology

## 2018-06-29 ENCOUNTER — Ambulatory Visit (HOSPITAL_COMMUNITY): Payer: 59 | Admitting: Anesthesiology

## 2018-06-29 ENCOUNTER — Other Ambulatory Visit: Payer: Self-pay

## 2018-06-29 ENCOUNTER — Encounter (HOSPITAL_COMMUNITY): Payer: Self-pay

## 2018-06-29 ENCOUNTER — Ambulatory Visit: Payer: Self-pay

## 2018-06-29 ENCOUNTER — Ambulatory Visit: Payer: Self-pay | Admitting: Oncology

## 2018-06-29 ENCOUNTER — Ambulatory Visit: Payer: Self-pay | Admitting: Cardiology

## 2018-06-29 DIAGNOSIS — Z882 Allergy status to sulfonamides status: Secondary | ICD-10-CM | POA: Insufficient documentation

## 2018-06-29 DIAGNOSIS — Z79899 Other long term (current) drug therapy: Secondary | ICD-10-CM | POA: Diagnosis not present

## 2018-06-29 DIAGNOSIS — N939 Abnormal uterine and vaginal bleeding, unspecified: Principal | ICD-10-CM | POA: Insufficient documentation

## 2018-06-29 DIAGNOSIS — Z880 Allergy status to penicillin: Secondary | ICD-10-CM | POA: Diagnosis not present

## 2018-06-29 DIAGNOSIS — N83202 Unspecified ovarian cyst, left side: Secondary | ICD-10-CM | POA: Insufficient documentation

## 2018-06-29 DIAGNOSIS — Z8614 Personal history of Methicillin resistant Staphylococcus aureus infection: Secondary | ICD-10-CM | POA: Diagnosis not present

## 2018-06-29 DIAGNOSIS — F419 Anxiety disorder, unspecified: Secondary | ICD-10-CM | POA: Insufficient documentation

## 2018-06-29 DIAGNOSIS — Z90710 Acquired absence of both cervix and uterus: Secondary | ICD-10-CM | POA: Diagnosis present

## 2018-06-29 DIAGNOSIS — Z885 Allergy status to narcotic agent status: Secondary | ICD-10-CM | POA: Diagnosis not present

## 2018-06-29 DIAGNOSIS — Z91040 Latex allergy status: Secondary | ICD-10-CM | POA: Insufficient documentation

## 2018-06-29 DIAGNOSIS — K660 Peritoneal adhesions (postprocedural) (postinfection): Secondary | ICD-10-CM | POA: Diagnosis not present

## 2018-06-29 DIAGNOSIS — N950 Postmenopausal bleeding: Secondary | ICD-10-CM | POA: Diagnosis not present

## 2018-06-29 DIAGNOSIS — N83201 Unspecified ovarian cyst, right side: Secondary | ICD-10-CM | POA: Insufficient documentation

## 2018-06-29 DIAGNOSIS — N938 Other specified abnormal uterine and vaginal bleeding: Secondary | ICD-10-CM | POA: Diagnosis not present

## 2018-06-29 DIAGNOSIS — N8302 Follicular cyst of left ovary: Secondary | ICD-10-CM | POA: Diagnosis not present

## 2018-06-29 DIAGNOSIS — M8580 Other specified disorders of bone density and structure, unspecified site: Secondary | ICD-10-CM | POA: Diagnosis not present

## 2018-06-29 DIAGNOSIS — R102 Pelvic and perineal pain: Secondary | ICD-10-CM | POA: Diagnosis not present

## 2018-06-29 DIAGNOSIS — C50919 Malignant neoplasm of unspecified site of unspecified female breast: Secondary | ICD-10-CM | POA: Insufficient documentation

## 2018-06-29 DIAGNOSIS — E785 Hyperlipidemia, unspecified: Secondary | ICD-10-CM | POA: Insufficient documentation

## 2018-06-29 DIAGNOSIS — C50411 Malignant neoplasm of upper-outer quadrant of right female breast: Secondary | ICD-10-CM | POA: Diagnosis not present

## 2018-06-29 DIAGNOSIS — Z170 Estrogen receptor positive status [ER+]: Secondary | ICD-10-CM | POA: Insufficient documentation

## 2018-06-29 DIAGNOSIS — N8301 Follicular cyst of right ovary: Secondary | ICD-10-CM | POA: Diagnosis not present

## 2018-06-29 HISTORY — PX: LAPAROSCOPIC VAGINAL HYSTERECTOMY WITH SALPINGO OOPHORECTOMY: SHX6681

## 2018-06-29 SURGERY — HYSTERECTOMY, VAGINAL, LAPAROSCOPY-ASSISTED, WITH SALPINGO-OOPHORECTOMY
Anesthesia: General | Laterality: Bilateral

## 2018-06-29 MED ORDER — SODIUM CHLORIDE 0.9% FLUSH: 9.0000 mL | INTRAVENOUS | Status: DC | PRN

## 2018-06-29 MED ORDER — HYDROMORPHONE HCL 1 MG/ML IJ SOLN: 0.2000 mg | INTRAMUSCULAR | Status: DC | PRN

## 2018-06-29 MED ORDER — SCOPOLAMINE 1 MG/3DAYS TD PT72
MEDICATED_PATCH | TRANSDERMAL | Status: AC
  Filled 2018-06-29: qty 1

## 2018-06-29 MED ORDER — DIPHENHYDRAMINE HCL 50 MG/ML IJ SOLN
INTRAMUSCULAR | Status: AC
  Filled 2018-06-29: qty 1

## 2018-06-29 MED ORDER — LACTATED RINGERS IV SOLN
INTRAVENOUS | Status: DC
  Administered 2018-06-29: 08:00:00 via INTRAVENOUS
  Administered 2018-06-29: 125 mL/h via INTRAVENOUS

## 2018-06-29 MED ORDER — ONDANSETRON HCL 4 MG/2ML IJ SOLN
INTRAMUSCULAR | Status: DC | PRN
  Administered 2018-06-29: 4 mg via INTRAVENOUS

## 2018-06-29 MED ORDER — IBUPROFEN 600 MG PO TABS
600.0000 mg | ORAL_TABLET | Freq: Four times a day (QID) | ORAL | Status: DC | PRN
  Administered 2018-06-30: 600 mg via ORAL
  Filled 2018-06-29: qty 1

## 2018-06-29 MED ORDER — BUPIVACAINE HCL (PF) 0.25 % IJ SOLN
INTRAMUSCULAR | Status: DC | PRN
  Administered 2018-06-29: 10 mL

## 2018-06-29 MED ORDER — MENTHOL 3 MG MT LOZG: 1.0000 | LOZENGE | OROMUCOSAL | Status: DC | PRN

## 2018-06-29 MED ORDER — HYDROMORPHONE HCL 1 MG/ML IJ SOLN: 0.2500 mg | INTRAMUSCULAR | Status: DC | PRN

## 2018-06-29 MED ORDER — PROMETHAZINE HCL 25 MG/ML IJ SOLN: 6.2500 mg | INTRAMUSCULAR | Status: DC | PRN

## 2018-06-29 MED ORDER — DIPHENHYDRAMINE HCL 12.5 MG/5ML PO ELIX: 12.5000 mg | ORAL_SOLUTION | Freq: Four times a day (QID) | ORAL | Status: DC | PRN

## 2018-06-29 MED ORDER — HYDROMORPHONE HCL 1 MG/ML IJ SOLN
1.0000 mg | INTRAMUSCULAR | Status: DC | PRN
  Administered 2018-06-29 (×2): 1 mg via INTRAVENOUS
  Filled 2018-06-29 (×2): qty 1

## 2018-06-29 MED ORDER — DIPHENHYDRAMINE HCL 25 MG PO CAPS
50.0000 mg | ORAL_CAPSULE | Freq: Four times a day (QID) | ORAL | Status: DC | PRN
  Administered 2018-06-29: 25 mg via ORAL

## 2018-06-29 MED ORDER — ONDANSETRON HCL 4 MG/2ML IJ SOLN
INTRAMUSCULAR | Status: AC
  Filled 2018-06-29: qty 2

## 2018-06-29 MED ORDER — DEXTROSE-NACL 5-0.45 % IV SOLN
INTRAVENOUS | Status: DC
  Administered 2018-06-29 – 2018-06-30 (×3): via INTRAVENOUS

## 2018-06-29 MED ORDER — ACETAMINOPHEN 10 MG/ML IV SOLN
INTRAVENOUS | Status: AC
  Filled 2018-06-29: qty 100

## 2018-06-29 MED ORDER — DIPHENHYDRAMINE HCL 50 MG/ML IJ SOLN
12.5000 mg | Freq: Once | INTRAMUSCULAR | Status: AC
  Administered 2018-06-29: 12.5 mg via INTRAVENOUS

## 2018-06-29 MED ORDER — DIPHENHYDRAMINE HCL 50 MG/ML IJ SOLN: 12.5000 mg | Freq: Four times a day (QID) | INTRAMUSCULAR | Status: DC | PRN

## 2018-06-29 MED ORDER — ACETAMINOPHEN 10 MG/ML IV SOLN: 1000.0000 mg | Freq: Once | INTRAVENOUS | Status: DC | PRN

## 2018-06-29 MED ORDER — KETOROLAC TROMETHAMINE 30 MG/ML IJ SOLN
INTRAMUSCULAR | Status: DC | PRN
  Administered 2018-06-29: 30 mg via INTRAVENOUS

## 2018-06-29 MED ORDER — SODIUM CHLORIDE 0.9 % IR SOLN
Status: DC | PRN
  Administered 2018-06-29: 3000 mL

## 2018-06-29 MED ORDER — GABAPENTIN 300 MG PO CAPS
300.0000 mg | ORAL_CAPSULE | Freq: Every day | ORAL | Status: DC
  Administered 2018-06-29: 300 mg via ORAL
  Filled 2018-06-29: qty 1

## 2018-06-29 MED ORDER — LIDOCAINE HCL (CARDIAC) PF 100 MG/5ML IV SOSY
PREFILLED_SYRINGE | INTRAVENOUS | Status: AC
  Filled 2018-06-29: qty 5

## 2018-06-29 MED ORDER — PROPOFOL 10 MG/ML IV BOLUS
INTRAVENOUS | Status: AC
  Filled 2018-06-29: qty 20

## 2018-06-29 MED ORDER — GENTAMICIN SULFATE 40 MG/ML IJ SOLN
INTRAVENOUS | Status: AC
  Administered 2018-06-29: 300 mg via INTRAVENOUS
  Filled 2018-06-29: qty 7.5

## 2018-06-29 MED ORDER — ROCURONIUM BROMIDE 100 MG/10ML IV SOLN
INTRAVENOUS | Status: AC
  Filled 2018-06-29: qty 1

## 2018-06-29 MED ORDER — OXYCODONE-ACETAMINOPHEN 5-325 MG PO TABS
1.0000 | ORAL_TABLET | ORAL | Status: DC | PRN
  Administered 2018-06-29: 1 via ORAL
  Administered 2018-06-30 (×2): 2 via ORAL
  Filled 2018-06-29: qty 1
  Filled 2018-06-29: qty 2
  Filled 2018-06-29: qty 1
  Filled 2018-06-29: qty 2

## 2018-06-29 MED ORDER — HYDROCORTISONE 1 % EX CREA
TOPICAL_CREAM | Freq: Four times a day (QID) | CUTANEOUS | Status: DC
  Administered 2018-06-29: 22:00:00 via TOPICAL
  Filled 2018-06-29: qty 28.35

## 2018-06-29 MED ORDER — MIDAZOLAM HCL 2 MG/2ML IJ SOLN
INTRAMUSCULAR | Status: AC
  Filled 2018-06-29: qty 2

## 2018-06-29 MED ORDER — HYDROMORPHONE HCL 1 MG/ML IJ SOLN
INTRAMUSCULAR | Status: AC
  Filled 2018-06-29: qty 1

## 2018-06-29 MED ORDER — LIDOCAINE HCL (CARDIAC) PF 100 MG/5ML IV SOSY
PREFILLED_SYRINGE | INTRAVENOUS | Status: DC | PRN
  Administered 2018-06-29: 100 mg via INTRAVENOUS

## 2018-06-29 MED ORDER — KETOROLAC TROMETHAMINE 30 MG/ML IJ SOLN
INTRAMUSCULAR | Status: AC
  Filled 2018-06-29: qty 1

## 2018-06-29 MED ORDER — DIPHENHYDRAMINE HCL 25 MG PO CAPS
25.0000 mg | ORAL_CAPSULE | Freq: Four times a day (QID) | ORAL | Status: DC | PRN
  Administered 2018-06-29 – 2018-06-30 (×2): 25 mg via ORAL
  Filled 2018-06-29 (×3): qty 1

## 2018-06-29 MED ORDER — HYDROMORPHONE HCL 1 MG/ML IJ SOLN
INTRAMUSCULAR | Status: DC | PRN
  Administered 2018-06-29 (×3): 1 mg via INTRAVENOUS

## 2018-06-29 MED ORDER — NALOXONE HCL 0.4 MG/ML IJ SOLN: 0.4000 mg | INTRAMUSCULAR | Status: DC | PRN

## 2018-06-29 MED ORDER — SUGAMMADEX SODIUM 200 MG/2ML IV SOLN
INTRAVENOUS | Status: AC
  Filled 2018-06-29: qty 2

## 2018-06-29 MED ORDER — SIMETHICONE 80 MG PO CHEW: 80.0000 mg | CHEWABLE_TABLET | Freq: Four times a day (QID) | ORAL | Status: DC | PRN

## 2018-06-29 MED ORDER — SUGAMMADEX SODIUM 200 MG/2ML IV SOLN
INTRAVENOUS | Status: DC | PRN
  Administered 2018-06-29: 125 mg via INTRAVENOUS

## 2018-06-29 MED ORDER — SCOPOLAMINE 1 MG/3DAYS TD PT72
1.0000 | MEDICATED_PATCH | Freq: Once | TRANSDERMAL | Status: DC
  Administered 2018-06-29: 1.5 mg via TRANSDERMAL

## 2018-06-29 MED ORDER — DEXAMETHASONE SODIUM PHOSPHATE 10 MG/ML IJ SOLN
INTRAMUSCULAR | Status: AC
  Filled 2018-06-29: qty 1

## 2018-06-29 MED ORDER — DIPHENHYDRAMINE HCL 50 MG/ML IJ SOLN
12.5000 mg | INTRAMUSCULAR | Status: DC | PRN
  Administered 2018-06-29: 12.5 mg via INTRAVENOUS
  Filled 2018-06-29: qty 1

## 2018-06-29 MED ORDER — ZOLPIDEM TARTRATE 5 MG PO TABS: 5.0000 mg | ORAL_TABLET | Freq: Every evening | ORAL | Status: DC | PRN

## 2018-06-29 MED ORDER — ONDANSETRON HCL 4 MG/2ML IJ SOLN
4.0000 mg | Freq: Four times a day (QID) | INTRAMUSCULAR | Status: DC | PRN
  Administered 2018-06-29: 4 mg via INTRAVENOUS
  Filled 2018-06-29: qty 2

## 2018-06-29 MED ORDER — SCOPOLAMINE 1 MG/3DAYS TD PT72
MEDICATED_PATCH | TRANSDERMAL | Status: AC
  Administered 2018-06-29: 1.5 mg via TRANSDERMAL
  Filled 2018-06-29: qty 1

## 2018-06-29 MED ORDER — NALOXONE HCL 0.4 MG/ML IJ SOLN
INTRAMUSCULAR | Status: AC
  Filled 2018-06-29: qty 1

## 2018-06-29 MED ORDER — ANASTROZOLE 1 MG PO TABS
1.0000 mg | ORAL_TABLET | Freq: Every day | ORAL | Status: DC
  Filled 2018-06-29 (×2): qty 1

## 2018-06-29 MED ORDER — MIDAZOLAM HCL 2 MG/2ML IJ SOLN: 1.0000 mg | Freq: Once | INTRAMUSCULAR | Status: DC

## 2018-06-29 MED ORDER — MIDAZOLAM HCL 5 MG/5ML IJ SOLN
INTRAMUSCULAR | Status: DC | PRN
  Administered 2018-06-29: 2 mg via INTRAVENOUS

## 2018-06-29 MED ORDER — BUPIVACAINE HCL (PF) 0.25 % IJ SOLN
INTRAMUSCULAR | Status: AC
  Filled 2018-06-29: qty 30

## 2018-06-29 MED ORDER — HYDROMORPHONE 1 MG/ML IV SOLN
INTRAVENOUS | Status: DC
  Administered 2018-06-29: 2 mg via INTRAVENOUS
  Administered 2018-06-29: 30 mg via INTRAVENOUS
  Administered 2018-06-29: 1 mg via INTRAVENOUS
  Filled 2018-06-29: qty 30

## 2018-06-29 MED ORDER — DEXAMETHASONE SODIUM PHOSPHATE 10 MG/ML IJ SOLN
INTRAMUSCULAR | Status: DC | PRN
  Administered 2018-06-29: 10 mg via INTRAVENOUS

## 2018-06-29 MED ORDER — ALUM & MAG HYDROXIDE-SIMETH 200-200-20 MG/5ML PO SUSP: 30.0000 mL | ORAL | Status: DC | PRN

## 2018-06-29 MED ORDER — PROPOFOL 10 MG/ML IV BOLUS
INTRAVENOUS | Status: DC | PRN
  Administered 2018-06-29: 150 mg via INTRAVENOUS

## 2018-06-29 MED ORDER — ROCURONIUM BROMIDE 100 MG/10ML IV SOLN
INTRAVENOUS | Status: DC | PRN
  Administered 2018-06-29: 10 mg via INTRAVENOUS
  Administered 2018-06-29: 40 mg via INTRAVENOUS

## 2018-06-29 MED ORDER — ACETAMINOPHEN 10 MG/ML IV SOLN
1000.0000 mg | Freq: Once | INTRAVENOUS | Status: AC
  Administered 2018-06-29: 1000 mg via INTRAVENOUS

## 2018-06-29 MED ORDER — LIDOCAINE HCL 1 % IJ SOLN
INTRAMUSCULAR | Status: AC
  Filled 2018-06-29: qty 20

## 2018-06-29 MED ORDER — GLYCOPYRROLATE 0.2 MG/ML IJ SOLN
INTRAMUSCULAR | Status: DC | PRN
  Administered 2018-06-29: 0.2 mg via INTRAVENOUS

## 2018-06-29 SURGICAL SUPPLY — 45 items
APPLICATOR ARISTA FLEXITIP XL (MISCELLANEOUS) ×3 IMPLANT
CABLE HIGH FREQUENCY MONO STRZ (ELECTRODE) IMPLANT
CATH ROBINSON RED A/P 16FR (CATHETERS) ×3 IMPLANT
CONT PATH 16OZ SNAP LID 3702 (MISCELLANEOUS) ×3 IMPLANT
COVER BACK TABLE 60X90IN (DRAPES) ×3 IMPLANT
COVER MAYO STAND STRL (DRAPES) ×3 IMPLANT
DECANTER SPIKE VIAL GLASS SM (MISCELLANEOUS) IMPLANT
DRSG OPSITE POSTOP 3X4 (GAUZE/BANDAGES/DRESSINGS) ×3 IMPLANT
DURAPREP 26ML APPLICATOR (WOUND CARE) ×3 IMPLANT
ELECT REM PT RETURN 9FT ADLT (ELECTROSURGICAL) ×3
ELECTRODE REM PT RTRN 9FT ADLT (ELECTROSURGICAL) ×1 IMPLANT
FORCEPS CUTTING 45CM 5MM (CUTTING FORCEPS) ×3 IMPLANT
GLOVE BIO SURGEON STRL SZ8 (GLOVE) ×3 IMPLANT
GLOVE BIOGEL PI IND STRL 6.5 (GLOVE) ×1 IMPLANT
GLOVE BIOGEL PI IND STRL 7.0 (GLOVE) ×3 IMPLANT
GLOVE BIOGEL PI INDICATOR 6.5 (GLOVE) ×2
GLOVE BIOGEL PI INDICATOR 7.0 (GLOVE) ×6
GLOVE SURG ORTHO 8.0 STRL STRW (GLOVE) ×9 IMPLANT
HEMOSTAT ARISTA ABSORB 3G PWDR (MISCELLANEOUS) ×3 IMPLANT
LEGGING LITHOTOMY PAIR STRL (DRAPES) ×3 IMPLANT
LIGASURE IMPACT 36 18CM CVD LR (INSTRUMENTS) IMPLANT
NEEDLE INSUFFLATION 120MM (ENDOMECHANICALS) ×3 IMPLANT
NS IRRIG 1000ML POUR BTL (IV SOLUTION) ×3 IMPLANT
PACK LAVH (CUSTOM PROCEDURE TRAY) ×3 IMPLANT
PACK ROBOTIC GOWN (GOWN DISPOSABLE) ×3 IMPLANT
PACK TRENDGUARD 450 HYBRID PRO (MISCELLANEOUS) ×1 IMPLANT
PROTECTOR NERVE ULNAR (MISCELLANEOUS) ×6 IMPLANT
SET IRRIG TUBING LAPAROSCOPIC (IRRIGATION / IRRIGATOR) IMPLANT
SLEEVE XCEL OPT CAN 5 100 (ENDOMECHANICALS) ×6 IMPLANT
SOLUTION ELECTROLUBE (MISCELLANEOUS) IMPLANT
SUT MNCRL 0 MO-4 VIOLET 18 CR (SUTURE) ×2 IMPLANT
SUT MNCRL 0 VIOLET 6X18 (SUTURE) ×1 IMPLANT
SUT MNCRL AB 0 CT1 27 (SUTURE) IMPLANT
SUT MON AB 2-0 CT1 36 (SUTURE) IMPLANT
SUT MONOCRYL 0 6X18 (SUTURE) ×2
SUT MONOCRYL 0 MO 4 18  CR/8 (SUTURE) ×4
SUT VICRYL 0 UR6 27IN ABS (SUTURE) ×3 IMPLANT
SUT VICRYL RAPIDE 3 0 (SUTURE) ×3 IMPLANT
TOWEL OR 17X24 6PK STRL BLUE (TOWEL DISPOSABLE) ×6 IMPLANT
TRAY FOLEY W/BAG SLVR 14FR (SET/KITS/TRAYS/PACK) ×3 IMPLANT
TRENDGUARD 450 HYBRID PRO PACK (MISCELLANEOUS) ×3
TROCAR OPTI TIP 5M 100M (ENDOMECHANICALS) ×3 IMPLANT
TROCAR XCEL DIL TIP R 11M (ENDOMECHANICALS) ×3 IMPLANT
TUBING INSUF HEATED (TUBING) ×3 IMPLANT
WARMER LAPAROSCOPE (MISCELLANEOUS) ×3 IMPLANT

## 2018-06-29 NOTE — Transfer of Care (Signed)
Immediate Anesthesia Transfer of Care Note  Patient: Katelyn Shelton  Procedure(s) Performed: LAPAROSCOPIC ASSISTED VAGINAL HYSTERECTOMY WITH SALPINGO OOPHORECTOMY (Bilateral )  Patient Location: PACU  Anesthesia Type:General  Level of Consciousness: awake and drowsy  Airway & Oxygen Therapy: Patient Spontanous Breathing and Patient connected to nasal cannula oxygen  Post-op Assessment: Report given to RN and Post -op Vital signs reviewed and stable  Post vital signs: Reviewed and stable  Last Vitals:  Vitals Value Taken Time  BP    Temp    Pulse 84 06/29/2018  9:11 AM  Resp 11 06/29/2018  9:11 AM  SpO2 97 % 06/29/2018  9:11 AM  Vitals shown include unvalidated device data.  Last Pain:  Vitals:   06/29/18 0637  TempSrc: Oral  PainSc: 0-No pain      Patients Stated Pain Goal: 3 (58/72/76 1848)  Complications: No apparent anesthesia complications

## 2018-06-29 NOTE — Op Note (Signed)
NAMEONELL, MCMATH MEDICAL RECORD [id number] ACCOUNT [id number] DATE OF BIRTH:[birth year] FACILITY: [facility] LOCATION: [id number] PHYSICIAN:Xavier Fournier C. Arther Heisler, MD  OPERATIVE REPORT  DATE OF PROCEDURE:  06/29/2018  PREOPERATIVE DIAGNOSIS:  Estrogen receptor positive breast cancer in a premenopausal woman and abnormal uterine bleeding and pelvic pain.  POSTOPERATIVE DIAGNOSIS:  Estrogen receptor positive, breast cancer in a premenopausal woman and abnormal uterine bleeding and pelvic pain.  PROCEDURE:  Laparoscopic assisted vaginal hysterectomy with bilateral salpingo-oophorectomy.  SURGEON:  Louretta Shorten, MD  ASSISTANT:  Evette Cristal, MD  ANESTHESIA:  General endotracheal.  INDICATIONS:  The patient is a 42 year old white female with a stage II breast cancer, estrogen receptor positive, undergoing treatment for this.  The oncologist recommended bilateral salpingo-oophorectomy.  She has also had a recent abnormal uterine  bleeding and pelvic pain.  At this time, desires hysterectomy as well.  Planned laparoscopic assisted vaginal hysterectomy, bilateral salpingo-oophorectomy.  Risks and benefits of the procedure were discussed at length and informed consent was obtained.  FINDINGS:  At the time of surgery were normal tubes, ovaries, uterus.  There were some bladder adhesions.  Otherwise, normal appearing liver and other intra-abdominal anatomy.  DESCRIPTION OF PROCEDURE:  After adequate anesthesia, the patient was placed in the dorsal lithotomy position.  She was sterilely prepped and draped.  Bladder was sterilely drained.  A Graves speculum was placed, tenaculum placed on the anterior lip of  the cervix.  An acorn tenaculum was then placed in the cervix.  Legs were repositioned.  An infraumbilical skin incision was made.  Veress needle was inserted.  The abdomen was insufflated to dullness percussion.  An 11 mm trocar was then inserted.   Laparoscope was then inserted.  The above findings were  noted.  A 5 mm trocar was inserted to the left of the midline, 2 fingerbreadths above the pubic symphysis.  After careful and systematic evaluation of the abdomen and pelvis, normal anatomy was  assured.  The Gyrus cutting forceps was used to ligate across the mesosalpinx bilaterally, across the infundibulopelvic ligaments bilaterally, down across the round ligaments bilaterally with good hemostasis and care taken to avoid injury to adjacent  structures.  Bladder flap was created removing bladder adhesions to the anterior surface of the cervix and creating a small bladder flap.  Abdomen was then desufflated.  Legs were repositioned.  A posterior weighted speculum was placed.  A posterior  colpotomy was performed.  The cervix was circumscribed with Bovie cautery and the LigaSure instrument was used to recreate across the uterosacral ligaments bilaterally, cardinal ligaments and bladder pillars bilaterally.  The anterior vaginal mucosa was  dissected off the anterior surface of the cervix.  Anterior peritoneum was entered sharply.  The Deaver retractor was placed underneath the bladder flap.  The ____ inferior portion of the broad ligament.  The uterus, tubes, and ovaries were then removed.   Uterosacral ligaments were identified bilaterally, suture ligated with of figure-of-eight 0 Monocryl suture.  The posterior peritoneum was then closed in a pursestring fashion with 0 Monocryl suture.  The uterosacral ligaments were then plicated with a  figure-of-eight 0 Monocryl suture and the posterior vaginal mucosa was closed with figure-of-eights of 0 Monocryl suture.  The anterior vaginal mucosa was then closed in a vertical fashion with figure-of-eights of 0 Monocryl suture with good  approximation and good hemostasis noted.  Foley catheter was placed with return of clear yellow urine.  The legs were repositioned.  Abdomen was insufflated.  ____ suction irrigator was used  to irrigate the abdomen and pelvis after  copious irrigation and  adequate hemostasis was assured.  The pedicles appeared to be hemostatic.  Good peristalsis was noted of the ureters bilaterally and no obvious injury to bowel or bladder.  The base of the bladder did have some slight bloody edges due to the resection  of the peritoneum from the bladder, so Arista was placed on the bladder with good hemostasis achieved.  The abdomen was then desufflated.  Trocars were removed.  The infraumbilical skin incision was closed with 0 Vicryl interrupted suture in the fascia,  3-0 Vicryl Rapide subcuticular suture.  The 5 mm site was closed with 3-0 Vicryl Rapide subcuticular suture and Dermabond.  The incisions were injected with 0.25% Marcaine.  A total of 10 mL used.  The patient was stable, transferred to recovery room.   Sponge and needle count was normal x3.  The patient received general anesthetic and clindamycin preoperatively.  Estimated blood loss was 100 mL.  TN/NUANCE  D:06/29/2018 T:06/29/2018 JOB:[id number]

## 2018-06-29 NOTE — Brief Op Note (Signed)
06/29/2018  8:58 AM  PATIENT:  Katelyn Shelton  42 y.o. female  PRE-OPERATIVE DIAGNOSIS:  AUB, PELVIC PAIN, PREMENOPAUSAL BREAST CANCER ESTROGEN RECET POSITIVE  POST-OPERATIVE DIAGNOSIS:  AUB, PELVIC PAIN, PREMENOPAUSAL BREAST CANCER ESTROGEN RECET POSITIVE  PROCEDURE:  Procedure(s) with comments: LAPAROSCOPIC ASSISTED VAGINAL HYSTERECTOMY WITH SALPINGO OOPHORECTOMY (Bilateral) - Request case for [employer] CRNA  SURGEON:  Surgeon(s) and Role:    * Louretta Shorten, MD - Primary  PHYSICIAN ASSISTANT:   ASSISTANTS: neal   ANESTHESIA:   general  EBL:  100 mL   BLOOD ADMINISTERED:none  DRAINS: Urinary Catheter (Foley)   LOCAL MEDICATIONS USED:  MARCAINE     SPECIMEN:  Source of Specimen:  uterus and bil tubes and ovaries  DISPOSITION OF SPECIMEN:  PATHOLOGY  COUNTS:  YES  TOURNIQUET:  * No tourniquets in log *  DICTATION: .Other Dictation: Dictation Number 1  PLAN OF CARE: Admit for overnight observation  PATIENT DISPOSITION:  PACU - hemodynamically stable.   Delay start of Pharmacological VTE agent (>24hrs) due to surgical blood loss or risk of bleeding: not applicable

## 2018-06-29 NOTE — Anesthesia Postprocedure Evaluation (Signed)
Anesthesia Post Note  Patient: Katelyn Shelton  Procedure(s) Performed: LAPAROSCOPIC ASSISTED VAGINAL HYSTERECTOMY WITH SALPINGO OOPHORECTOMY (Bilateral )     Patient location during evaluation: [facility] Anesthesia Type: General Level of consciousness: awake and alert Pain management: pain level controlled Vital Signs Assessment: post-procedure vital signs reviewed and stable Respiratory status: spontaneous breathing, nonlabored ventilation, respiratory function stable and patient connected to nasal cannula oxygen Cardiovascular status: blood pressure returned to baseline and stable Postop Assessment: no apparent nausea or vomiting Anesthetic complications: no    Last Vitals:  Vitals:   06/29/18 1354 06/29/18 1410  BP: 130/85   Pulse: 67   Resp: 14 13  Temp:    SpO2: 100%     Last Pain:  Vitals:   06/29/18 1410  TempSrc:   PainSc: 3    Pain Goal: Patients Stated Pain Goal: 3 (06/29/18 1410)               Riki Sheer

## 2018-06-29 NOTE — Anesthesia Procedure Notes (Signed)
Procedure Name: Intubation Date/Time: 06/29/2018 7:42 AM Performed by: Riki Sheer, CRNA Pre-anesthesia Checklist: Patient identified, Emergency Drugs available, Suction available, Patient being monitored and Timeout performed Patient Re-evaluated:Patient Re-evaluated prior to induction Oxygen Delivery Method: Circle system utilized Preoxygenation: Pre-oxygenation with 100% oxygen Induction Type: IV induction Ventilation: Mask ventilation without difficulty Laryngoscope Size: Miller and 2 Grade View: Grade I Tube type: Oral Tube size: 7.0 mm Number of attempts: 1 Airway Equipment and Method: Stylet Placement Confirmation: ETT inserted through vocal cords under direct vision,  positive ETCO2,  CO2 detector and breath sounds checked- equal and bilateral Secured at: 20 cm Tube secured with: Tape Dental Injury: Teeth and Oropharynx as per pre-operative assessment

## 2018-06-29 NOTE — Addendum Note (Signed)
Addendum  created 06/29/18 1444 by Riki Sheer, CRNA   Sign clinical note

## 2018-06-29 NOTE — Anesthesia Postprocedure Evaluation (Signed)
Anesthesia Post Note  Patient: Katelyn Shelton  Procedure(s) Performed: LAPAROSCOPIC ASSISTED VAGINAL HYSTERECTOMY WITH SALPINGO OOPHORECTOMY (Bilateral )     Patient location during evaluation: PACU Anesthesia Type: General Level of consciousness: awake and sedated Pain management: pain level controlled Vital Signs Assessment: post-procedure vital signs reviewed and stable Respiratory status: spontaneous breathing Cardiovascular status: stable Postop Assessment: no apparent nausea or vomiting Anesthetic complications: no    Last Vitals:  Vitals:   06/29/18 1015 06/29/18 1030  BP: (!) 141/96 137/89  Pulse: 95 91  Resp: 12 12  Temp:    SpO2: 98% 94%    Last Pain:  Vitals:   06/29/18 1030  TempSrc:   PainSc: Asleep   Pain Goal: Patients Stated Pain Goal: 3 (06/29/18 1030)               [place]

## 2018-06-30 ENCOUNTER — Other Ambulatory Visit: Payer: Self-pay

## 2018-06-30 ENCOUNTER — Encounter (HOSPITAL_COMMUNITY): Payer: Self-pay | Admitting: Obstetrics and Gynecology

## 2018-06-30 DIAGNOSIS — E785 Hyperlipidemia, unspecified: Secondary | ICD-10-CM | POA: Diagnosis not present

## 2018-06-30 DIAGNOSIS — Z79899 Other long term (current) drug therapy: Secondary | ICD-10-CM | POA: Diagnosis not present

## 2018-06-30 DIAGNOSIS — C50919 Malignant neoplasm of unspecified site of unspecified female breast: Secondary | ICD-10-CM | POA: Diagnosis not present

## 2018-06-30 DIAGNOSIS — N83202 Unspecified ovarian cyst, left side: Secondary | ICD-10-CM | POA: Diagnosis not present

## 2018-06-30 DIAGNOSIS — N83201 Unspecified ovarian cyst, right side: Secondary | ICD-10-CM | POA: Diagnosis not present

## 2018-06-30 DIAGNOSIS — M8580 Other specified disorders of bone density and structure, unspecified site: Secondary | ICD-10-CM | POA: Diagnosis not present

## 2018-06-30 DIAGNOSIS — R102 Pelvic and perineal pain: Secondary | ICD-10-CM | POA: Diagnosis not present

## 2018-06-30 DIAGNOSIS — F419 Anxiety disorder, unspecified: Secondary | ICD-10-CM | POA: Diagnosis not present

## 2018-06-30 DIAGNOSIS — N939 Abnormal uterine and vaginal bleeding, unspecified: Secondary | ICD-10-CM | POA: Diagnosis not present

## 2018-06-30 LAB — CBC WITH DIFFERENTIAL/PLATELET
Basophils Absolute: 0 10*3/uL (ref 0.0–0.1)
Basophils Relative: 0 %
EOS ABS: 0 10*3/uL (ref 0.0–0.7)
EOS PCT: 0 %
HCT: 36.8 % (ref 36.0–46.0)
HEMOGLOBIN: 12.6 g/dL (ref 12.0–15.0)
LYMPHS ABS: 1.5 10*3/uL (ref 0.7–4.0)
Lymphocytes Relative: 14 %
MCH: 32.8 pg (ref 26.0–34.0)
MCHC: 34.2 g/dL (ref 30.0–36.0)
MCV: 95.8 fL (ref 78.0–100.0)
MONO ABS: 0.6 10*3/uL (ref 0.1–1.0)
MONOS PCT: 6 %
NEUTROS PCT: 80 %
Neutro Abs: 8.2 10*3/uL — ABNORMAL HIGH (ref 1.7–7.7)
Platelets: 229 10*3/uL (ref 150–400)
RBC: 3.84 MIL/uL — ABNORMAL LOW (ref 3.87–5.11)
RDW: 13.2 % (ref 11.5–15.5)
WBC: 10.4 10*3/uL (ref 4.0–10.5)

## 2018-06-30 MED ORDER — IBUPROFEN 600 MG PO TABS: 600.0000 mg | ORAL_TABLET | Freq: Four times a day (QID) | ORAL | 0 refills | Status: DC | PRN

## 2018-06-30 MED ORDER — OXYCODONE-ACETAMINOPHEN 5-325 MG PO TABS: 1.0000 | ORAL_TABLET | ORAL | 0 refills | Status: DC | PRN

## 2018-06-30 NOTE — Progress Notes (Signed)
1 Day Post-Op Procedure(s) (LRB): LAPAROSCOPIC ASSISTED VAGINAL HYSTERECTOMY WITH SALPINGO OOPHORECTOMY (Bilateral)  Subjective: Patient reports tolerating PO and + flatus.  Still waiting to void  Objective: I have reviewed patient's vital signs, intake and output and medications.  General: alert, cooperative, appears stated age and no distress GI: soft, non-tender; bowel sounds normal; no masses,  no organomegaly and incision: clean, dry and intact Vaginal Bleeding: minimal  Assessment: s/p Procedure(s) with comments: LAPAROSCOPIC ASSISTED VAGINAL HYSTERECTOMY WITH SALPINGO OOPHORECTOMY (Bilateral) - Request case for Melinda CRNA: stable  Plan: Advance to PO medication Discontinue IV fluids Discharge home  LOS: 0 days    Ankit Degregorio C 06/30/2018, 9:57 AM

## 2018-06-30 NOTE — Discharge Summary (Signed)
Physician Discharge Summary  Patient ID: Katelyn Shelton MRN: [id number] DOB/AGE: 1976/01/14 42 y.o.  Admit date: 06/29/2018 Discharge date: 06/30/2018  Admission Diagnoses:Pelvic pain, AUB, Breast Ca with estrogen/prog positive  Discharge Diagnoses: Same Active Problems:   S/P laparoscopic assisted vaginal hysterectomy (LAVH)   Discharged Condition: good  Hospital Course: Katelyn Shelton underwent an uncomplicated LAVH,BSO with an EBL of 100cc.  Her post op course was unremarkable with quick return of bowel and bladder, ambulating without difficulty and pain well controlled on po meds.  Her post op Hgb was 12.6  Consults: None  Significant Diagnostic Studies: labs: Hgb 12.6  Treatments: surgery: LAVH, BSO  Discharge Exam: Blood pressure 119/77, pulse 80, temperature 98.7 F (37.1 C), temperature source Oral, resp. rate 12, SpO2 98 %. General appearance: alert, cooperative, appears stated age and no distress GI: soft, non-tender; bowel sounds normal; no masses,  no organomegaly Pelvic: minimal vaginal bleeding Incision/Wound:CD&I  Disposition: Discharge disposition: 01-Home or Self Care       Discharge Instructions    Call MD for:  difficulty breathing, headache or visual disturbances   Complete by:  As directed    Call MD for:  persistant nausea and vomiting   Complete by:  As directed    Call MD for:  redness, tenderness, or signs of infection (pain, swelling, redness, odor or green/yellow discharge around incision site)   Complete by:  As directed    Call MD for:  severe uncontrolled pain   Complete by:  As directed    Call MD for:  temperature >100.4   Complete by:  As directed    Diet general   Complete by:  As directed    Driving Restrictions   Complete by:  As directed    No driving for 2 weeks   Increase activity slowly   Complete by:  As directed    Lifting restrictions   Complete by:  As directed    No lifting anything greater than 10 pounds (if you have to ask,  don't lift it)   Sexual Activity Restrictions   Complete by:  As directed    Nothing in the vagina for 6 weeks     Allergies as of 06/30/2018      Reactions   Fentanyl Itching   Severe itching - Patient refuses this medication   Peach Flavor Anaphylaxis   Penicillins Hives   AS A CHILD   Sulfa Antibiotics Nausea And Vomiting   Sulfasalazine Nausea And Vomiting, Other (See Comments)   Latex Itching      Medication List    STOP taking these medications   ZOLADEX [place]     TAKE these medications   acetaminophen 325 MG tablet Commonly known as:  TYLENOL Take 650 mg by mouth every 6 (six) hours as needed.   anastrozole 1 MG tablet Commonly known as:  ARIMIDEX Take 1 tablet (1 mg total) by mouth daily.   CALCIUM 600 PO Take 1 capsule by mouth daily.   Evolocumab 140 MG/ML Soaj Inject 1 pen into the skin every 14 (fourteen) days.   Evolocumab 140 MG/ML Soaj Inject into the skin.   gabapentin 300 MG capsule Commonly known as:  NEURONTIN Take 1 capsule (300 mg total) by mouth at bedtime.   ibuprofen 600 MG tablet Commonly known as:  ADVIL,MOTRIN Take 1 tablet (600 mg total) by mouth every 6 (six) hours as needed for mild pain or moderate pain (mild pain).   MULTIVITAMIN WOMEN 50+ Tabs Take 1 tablet  by mouth daily.   oxyCODONE-acetaminophen 5-325 MG tablet Commonly known as:  PERCOCET/ROXICET Take 1-2 tablets by mouth every 4 (four) hours as needed for moderate pain or severe pain.   venlafaxine XR 75 MG 24 hr capsule Commonly known as:  EFFEXOR-XR Take 1 capsule (75 mg total) by mouth daily with breakfast.        Signed: Gavynn Duvall C 06/30/2018, 10:01 AM

## 2018-06-30 NOTE — Plan of Care (Signed)
Pts. Condition will continue to improve 

## 2018-07-06 ENCOUNTER — Inpatient Hospital Stay: Payer: 59 | Attending: Oncology

## 2018-07-06 ENCOUNTER — Other Ambulatory Visit: Payer: Self-pay

## 2018-07-06 ENCOUNTER — Ambulatory Visit: Payer: Self-pay | Admitting: Oncology

## 2018-07-06 ENCOUNTER — Ambulatory Visit: Payer: Self-pay

## 2018-07-06 VITALS — BP 111/57 | HR 89 | Temp 98.2°F | Resp 18

## 2018-07-06 DIAGNOSIS — M8580 Other specified disorders of bone density and structure, unspecified site: Secondary | ICD-10-CM | POA: Diagnosis not present

## 2018-07-06 DIAGNOSIS — C50411 Malignant neoplasm of upper-outer quadrant of right female breast: Secondary | ICD-10-CM | POA: Diagnosis not present

## 2018-07-06 DIAGNOSIS — Z79811 Long term (current) use of aromatase inhibitors: Secondary | ICD-10-CM | POA: Diagnosis not present

## 2018-07-06 DIAGNOSIS — Z170 Estrogen receptor positive status [ER+]: Secondary | ICD-10-CM | POA: Diagnosis not present

## 2018-07-06 MED ORDER — DENOSUMAB 60 MG/ML ~~LOC~~ SOSY
PREFILLED_SYRINGE | SUBCUTANEOUS | Status: AC
  Filled 2018-07-06: qty 1

## 2018-07-06 MED ORDER — GOSERELIN ACETATE 3.6 MG ~~LOC~~ IMPL
DRUG_IMPLANT | SUBCUTANEOUS | Status: AC
  Filled 2018-07-06: qty 3.6

## 2018-07-06 MED ORDER — GOSERELIN ACETATE 3.6 MG ~~LOC~~ IMPL: 3.6000 mg | DRUG_IMPLANT | Freq: Once | SUBCUTANEOUS | Status: DC

## 2018-07-06 MED ORDER — DENOSUMAB 60 MG/ML ~~LOC~~ SOLN
60.0000 mg | Freq: Once | SUBCUTANEOUS | Status: AC
  Administered 2018-07-06: 60 mg via SUBCUTANEOUS

## 2018-07-17 MED FILL — REPATHA 140 MG/1 ML INJ: 140 | 28 days supply | Qty: 2 | Fill #2

## 2018-07-19 ENCOUNTER — Encounter (HOSPITAL_BASED_OUTPATIENT_CLINIC_OR_DEPARTMENT_OTHER): Payer: Self-pay | Admitting: *Deleted

## 2018-07-19 ENCOUNTER — Other Ambulatory Visit: Payer: Self-pay

## 2018-07-19 DIAGNOSIS — Z853 Personal history of malignant neoplasm of breast: Secondary | ICD-10-CM | POA: Diagnosis not present

## 2018-07-19 DIAGNOSIS — Z923 Personal history of irradiation: Secondary | ICD-10-CM | POA: Diagnosis not present

## 2018-07-19 DIAGNOSIS — Z9013 Acquired absence of bilateral breasts and nipples: Secondary | ICD-10-CM | POA: Diagnosis not present

## 2018-07-19 MED FILL — CIPROFLOXACIN HCL 500 MG TA: 500 | 7 days supply | Qty: 14 | Fill #0

## 2018-07-19 NOTE — Progress Notes (Signed)
Ensure pre surgery drink given with instructions to complete by 0415 dos, surgical soap given with instructions, pt verbalized understanding. 

## 2018-07-19 NOTE — H&P (Signed)
Subjective:     Patient ID: Katelyn Shelton is a 42 y.o. female.  HPI  16 months post operative bilateral mastectomies. Completed adjuvant radiation 12.7.18. On arimidex. Underwent hysterectomy/BSO 9.5.19. States no pain from this. Does have cold presently. Plan implant exchange and lipofilling next week.  Presented with palpable mass right breast. MMG with mass containing heterogeneous calcifications  identified within the UOQ. Additional 3 mm group of slightly heterogeneous calcifications within the retroareolar left breast noted. US showed 4 x 1.6 x 3.5 cm mass at the 10 o'clock position 4 cm from the nipple. Biopsy demonstrated IDC with DCIS, ER/PR+, Her2-. Final pathology 1/1 SLN, right IDC spanning at least 4 cm with both retroareolar and nipple specimen positive for IDC. +LVI, perineural invasion. She underwent resection right NAC with clear margins, no further carcinoma.  Oncotype 11. Genetics negative. Mammaprint low risk.  PMH significant for silicone implant as part of pectus excavatum repair performed by Dr. Jaclynn Guarneri 1996.  Prior 32B, happy with this. Left mastectomy 289 g Right 239 g  Review of Systems     Objective:   Physical Exam  Cardiovascular: Normal rate, regular rhythm and normal heart sounds.  Pulmonary/Chest: Effort normal and breath sounds normal.  Abd soft umbilical and low transverse scar maturing  Chest: soft, soft tissue quite mobile over TE including right chest Right hyperpigmentation, less than last visit No masses SN to nipple L 23 (CW 13 cm) Nipple to IMF L 7 Right axilla - notes new slightly blue/purple area, appears to be scar but patient states no biopsy or incision in this area no masses  Assessment:     R breast ca UOQ Pectus excavatum, s/p silicone implant placement S/p bilateral NSM, right SLN, TE/ADM (Alloderm) reconstruction Adjuvant chemotherapy, radiation    Plan:      Reviewed radiation increases risks of  reconstruction including wound healing problems, contracture. We have previously discussed that in prepectoral space, encouraging papers regarding radiation and outcome. Reviewed options option latissimus flap for right side for coverage vs implant exchange alone. Patient has elected for implant exchange alone. I have recommended silicone implants, capacity filled vs HCG given prepectoral space. Discussed MRI or [place] surveillance silicone implants for rupture, risks infection requiring removal, capsular contracture. Plan smooth round capacity filled.  Discussed fat grafting at time implant exchange. Reviewed purpose fat grafting to thicken flaps, prevent visible rippling. May have some benefit with regards to radiation changes. Reviewed variable take graft, may need to repeat, fat necrosis that presents as cysts, lumps, calcification. She states no pain over abdomen and would like to use her low transverse scar for liposuction access- will plan supra and infra umbilical abdomen in that setting. Asked her to purchase compression garment and bring at time of surgery.  She asked to do nipple reconstruction at time of this surgery, counseled I would prefer to delay to ensure size, position of implant is appropriate.  Plan OP surgery, no drains anticipated. She has some concern about current size, shirts not fitting. Goal not larger than present. Reviewed will utilize IMF incision bilateral for exchange.  Rx for Cipro given. Has Percocet at home from hysterectomy, will check her ibuprofen supply. Will let us know if cold progresses ie if develops fever or productive cough then will need to push back surgery.  15 minutes spent with patient over half in counseling.  Natrelle 133FV-12-T 400 ml tissue expanders placed bilateral, Right expander filled with 340 ml saline, left expander filled with 350 ml saline  Irene Limbo, MD [facility] [phone], pin [phone]

## 2018-07-20 ENCOUNTER — Ambulatory Visit (INDEPENDENT_AMBULATORY_CARE_PROVIDER_SITE_OTHER): Payer: 59 | Admitting: Cardiology

## 2018-07-20 VITALS — BP 108/70 | HR 77 | Ht 60.0 in | Wt 133.0 lb

## 2018-07-20 DIAGNOSIS — E782 Mixed hyperlipidemia: Secondary | ICD-10-CM

## 2018-07-20 MED FILL — VENLAFAXINE HCL ER 75 MG CA: 75 | 90 days supply | Qty: 90 | Fill #1

## 2018-07-20 NOTE — Progress Notes (Signed)
Cardiology Office Note    Date:  07/20/2018   ID:  PHILLIP SANDLER, DOB 05-09-1976, MRN [id number]  PCP:  Marin Olp, MD  Cardiologist: Ena Dawley, MD   Reason for visit: Hyperlipidemia  History of Present Illness:  Katelyn Shelton is a 42 y.o. female with prior medical h/o hyperlipidemia and breast cancer. She was diagnosed with metastatic breast cancer in the right breast, estrogen positive in 2018.  She was started on anastrozole (Arimidex) in December 2019, ovarian suppression started in October 2018with monthly goserelin (Zoladex), bone density 11/25/17 shows osteopenia with t score -2.0 and now on denosumab (Prolia) for osteoporosis since March 2019.   She has FH of hyperlipidemia in her mother. Her maternal grandfather had MI, he was a smoker. She has known hyperlipidemia since at least 2010. She was started on Crestor in October 2019 that caused significant neuropathy and was discontinued. Livalo was tried in March 2019 but caused burning of the left thigh to the point that she had to limit her exercise. She was also experiencing significant morning stiffness. It gets better with motion and exercise. She is also experiencing generalized joint pain. All these symptoms are new and started after she was treated for breast cancer.  She states she started with soreness in joints on aromatase inhibitor/arimedex but also states had similar phenomenon on Crestor.   She exercises almost daily and eats very healthy diet.  She denies any chest pain, SOB, no LE edema, orthopnea , no palpitations.   07/20/2018, she is feeling significantly better, she continues to have muscle and joint stiffness however she feels like she has very more energy and her muscle pain has resolved.  She is tolerating Repatha very well.  She underwent hysterectomy earlier this months with only mild fatigue, and is scheduled for another reconstructive breast surgery next months.  She continues to take anastrozole and  Prolia.  She had mild cough for about a week that has resolved.  Past Medical History:  Diagnosis Date  . Allergy   . Anxiety    venflafaxine XR 75 mg  . Breast cancer ([place]) 01/2017   right; genetic testing negative in 2018 with [employer] panel  . Heart murmur    as child- went away  . History of MRSA infection    elbow  . Hyperlipidemia   . Joint pain    due to medicine induced joint pain  . Migraine    otc med prn  . Miscarriage    x1  . 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 Osteopenia   . UTI (urinary tract infection)     Past Surgical History:  Procedure Laterality Date  . BREAST RECONSTRUCTION WITH PLACEMENT OF TISSUE EXPANDER AND FLEX HD (ACELLULAR HYDRATED DERMIS) Bilateral 02/28/2017   Procedure: BILATERAL BREAST RECONSTRUCTION WITH PLACEMENT OF TISSUE EXPANDER AND ALLODERM;  Surgeon: Irene Limbo, MD;  Location: [facility];  Service: Plastics;  Laterality: Bilateral;  . CESAREAN SECTION  2008,2010,2013   x 3  . CESAREAN SECTION  02/04/2012   Procedure: CESAREAN SECTION;  Surgeon: Cyril Mourning, MD;  Location: [facility];  Service: Gynecology;  Laterality: N/A;  Repeat Cesarean Section Delivery  Boy  @  [phone], Apgars 9/10  . COLONOSCOPY     polyp  . DILATION AND CURETTAGE OF UTERUS    . EXCISION OF BREAST LESION Right 03/15/2017   Procedure: RIGHT NIPPLE AND AREOLA EXCISION;  Surgeon: Donne Hazel,  Rodman Key, MD;  Location: [facility];  Service: General;  Laterality: Right;  . LAPAROSCOPIC VAGINAL HYSTERECTOMY WITH SALPINGO OOPHORECTOMY Bilateral 06/29/2018   Procedure: LAPAROSCOPIC ASSISTED VAGINAL HYSTERECTOMY WITH SALPINGO OOPHORECTOMY;  Surgeon: Louretta Shorten, MD;  Location: [facility];  Service: Gynecology;  Laterality: Bilateral;  Request case for [facility] CRNA  . NIPPLE SPARING MASTECTOMY Bilateral 02/28/2017   Procedure: RIGHT NIPPLE SPARING MASTECTOMY; LEFT PROPHYLACTIC NIPPLE SPARING MASTECTOMY;  Surgeon:  Rolm Bookbinder, MD;  Location: [facility];  Service: General;  Laterality: Bilateral;  . PECTUS EXCAVATUM REPAIR  3511   42 years old- very large implant  . SENTINEL NODE BIOPSY Right 02/28/2017   Procedure: RIGHT AXILLARY SENTINEL LYMPH  NODE BIOPSY;  Surgeon: Rolm Bookbinder, MD;  Location: [facility];  Service: General;  Laterality: Right;  . VULVAR LESION REMOVAL    . WISDOM TOOTH EXTRACTION     Current Medications: Outpatient Medications Prior to Visit  Medication Sig Dispense Refill  . acetaminophen (TYLENOL) 325 MG tablet Take 650 mg by mouth every 6 (six) hours as needed.    [name] anastrozole (ARIMIDEX) 1 MG tablet Take 1 tablet (1 mg total) by mouth daily. 90 tablet 4  . Calcium Carbonate (CALCIUM 600 PO) Take 1 capsule by mouth daily.     [name] denosumab (PROLIA) 60 MG/ML SOSY injection Inject 60 mg into the skin every 6 (six) months.    . Evolocumab (REPATHA SURECLICK) 588 MG/ML SOAJ Inject 1 pen into the skin every 14 (fourteen) days. 2 pen 11  . gabapentin (NEURONTIN) 300 MG capsule Take 1 capsule (300 mg total) by mouth at bedtime. 90 capsule 4  . ibuprofen (ADVIL,MOTRIN) 600 MG tablet Take 1 tablet (600 mg total) by mouth every 6 (six) hours as needed for mild pain or moderate pain (mild pain). 30 tablet 0  . Multiple Vitamins-Minerals (MULTIVITAMIN WOMEN 50+) TABS Take 1 tablet by mouth daily.     [name] venlafaxine XR (EFFEXOR-XR) 75 MG 24 hr capsule Take 1 capsule (75 mg total) by mouth daily with breakfast. 90 capsule 3   No facility-administered medications prior to visit.      Allergies:   Fentanyl; Peach flavor; Penicillins; Sulfa antibiotics; Sulfasalazine; and Latex   Social History   Socioeconomic History  . Marital status: Married    Spouse name: [name]  . Number of children: Not on file  . Years of education: Not on file  . Highest education level: Not on file  Occupational History  . Not on file  Social Needs  . Financial resource  strain: Not on file  . Food insecurity:    Worry: Not on file    Inability: Not on file  . Transportation needs:    Medical: Not on file    Non-medical: Not on file  Tobacco Use  . Smoking status: Never Smoker  . Smokeless tobacco: Never Used  Substance and Sexual Activity  . Alcohol use: Yes    Alcohol/week: 1.0 - 2.0 standard drinks    Types: 1 - 2 Standard drinks or equivalent per week    Comment: occasional wine - twice monthly  . Drug use: No  . Sexual activity: Yes    Birth control/protection: Surgical  Lifestyle  . Physical activity:    Days per week: Not on file    Minutes per session: Not on file  . Stress: Not on file  Relationships  . Social connections:    Talks on phone: Not on file  Gets together: Not on file    Attends religious service: Not on file    Active member of club or organization: Not on file    Attends meetings of clubs or organizations: Not on file    Relationship status: Not on file  Other Topics Concern  . Not on file  Social History Narrative   Married to Dr. Aundra Dubin. 3 children.    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    Masters in public history at [employer]. Undergrad at [employer]: running, reading, exercise     Family History:  The patient's family history includes Alzheimer's disease in her paternal grandfather; Breast cancer in her cousin; Colon polyps in her maternal uncle, mother, and sister; Depression in her maternal grandfather; Diabetes in her maternal aunt; Early death in her maternal grandfather; Heart attack in her maternal grandfather; Heart failure in her maternal grandmother; Hyperlipidemia in her father and mother; Hypertension in her mother; 34 / Stillbirths in her sister; Parkinson's disease (age of onset: 82) in her sister; Prostate cancer in her paternal grandfather; Skin cancer in her mother; Stomach cancer in her paternal grandmother; Stroke in her maternal  grandmother.   ROS:   Please see the history of present illness.    ROS All other systems reviewed and are negative.  PHYSICAL EXAM:   VS:  BP 108/70   Pulse 77   Ht 5' (1.524 m)   Wt 133 lb (60.3 kg)   LMP 09/01/2017   BMI 25.97 kg/m    GEN: Well nourished, well developed, in no acute distress  HEENT: normal  Neck: no JVD, carotid bruits, or masses Cardiac: RRR; no murmurs, rubs, or gallops,no edema  Respiratory:  clear to auscultation bilaterally, normal work of breathing GI: soft, nontender, nondistended, + BS MS: no deformity or atrophy  Skin: warm and dry, no rash Neuro:  Alert and Oriented x 3, Strength and sensation are intact Psych: euthymic mood, full affect  Wt Readings from Last 3 Encounters:  07/20/18 133 lb (60.3 kg)  06/19/18 131 lb 4 oz (59.5 kg)  05/16/18 129 lb 12.8 oz (58.9 kg)    Studies/Labs Reviewed:   EKG:  EKG is ordered today.  The ekg ordered today demonstrates SR, normal ECG  Recent Labs: 06/19/2018: BUN 16; Creatinine, Ser 0.53; Potassium 3.7; Sodium 140 06/22/2018: ALT 25 06/30/2018: Hemoglobin 12.6; Platelets 229   Lipid Panel    Component Value Date/Time   CHOL 154 06/22/2018 0817   TRIG 110 06/22/2018 0817   HDL 57 06/22/2018 0817   CHOLHDL 2.7 06/22/2018 0817   CHOLHDL 4 03/13/2018 0829   VLDL 22.2 03/13/2018 0829   LDLCALC 75 06/22/2018 0817    Additional studies/ records that were reviewed today include:   CT pelvis: 02/21/2018 IMPRESSION: 1. A cause for the patient's symptoms is not identified. 2. Sclerotic lesions in the right iliac bone and right acetabulum are stable from 2015 and accordingly unlikely to represent active malignancy. 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 3. Bilateral common iliac artery atherosclerotic calcification.  Calcium  score: 03/30/2018 Calcium score is 0. IMPRESSION: Clustered nodules in the inferior right upper lobe and adjacent superior right middle lobe, likely inflammatory.  ASSESSMENT:    1. Mixed hyperlipidemia  PLAN:  In order of problems listed above:  1. Familial hyperlipidemia - LDL 178, LDL particles 1948, TG 215, Apo LP B 141, the patient has severe side effects to Crestor and Livalo, she also has evidence for PAD -calcification sof the iliac arteries, she is tolerating Repatha well with excellent improvement in her lipids. 2. Clustered nodules in the inferior right upper lobe and adjacent superior right middle lobe - most likely reflect postradiation changes or inflammatory. These do not have the typical appearance of metastases. But given the history of breast cancer, I would recommend follow-up chest CT without contrast, this is scheduled for October 10, 2018.  Follow-up in 6 months.  Medication Adjustments/Labs and Tests Ordered: Current medicines are reviewed at length with the patient today.  Concerns regarding medicines are outlined above.  Medication changes, Labs and Tests ordered today are listed in the Patient Instructions below. Patient Instructions  Medication Instructions:   Your physician recommends that you continue on your current medications as directed. Please refer to the Current Medication list given to you today.     Follow-Up:  Your physician wants you to follow-up in: [place] will receive a reminder letter in the mail two months in advance. If you don't receive a letter, please call our office to schedule the follow-up appointment.        If you need a refill on your cardiac medications before your next appointment, please call your pharmacy.      Signed, Ena Dawley, MD  07/20/2018 12:21 PM    [facility] [address], [place], [place]  [address] Phone: [phone]; Fax: [phone]

## 2018-07-20 NOTE — Patient Instructions (Signed)

## 2018-07-25 ENCOUNTER — Encounter (HOSPITAL_BASED_OUTPATIENT_CLINIC_OR_DEPARTMENT_OTHER): Payer: Self-pay | Admitting: *Deleted

## 2018-07-25 ENCOUNTER — Other Ambulatory Visit: Payer: Self-pay

## 2018-07-25 ENCOUNTER — Ambulatory Visit (HOSPITAL_BASED_OUTPATIENT_CLINIC_OR_DEPARTMENT_OTHER): Payer: 59 | Admitting: Anesthesiology

## 2018-07-25 ENCOUNTER — Ambulatory Visit (HOSPITAL_BASED_OUTPATIENT_CLINIC_OR_DEPARTMENT_OTHER)
Admission: RE | Admit: 2018-07-25 | Discharge: 2018-07-25 | Disposition: A | Discharge status: Home / Self Care | Payer: 59 | Source: Ambulatory Visit | Attending: Plastic Surgery | Admitting: Plastic Surgery

## 2018-07-25 ENCOUNTER — Encounter (HOSPITAL_BASED_OUTPATIENT_CLINIC_OR_DEPARTMENT_OTHER)
Admission: RE | Disposition: A | Discharge status: Home / Self Care | Payer: Self-pay | Source: Ambulatory Visit | Attending: Plastic Surgery

## 2018-07-25 DIAGNOSIS — Z9013 Acquired absence of bilateral breasts and nipples: Secondary | ICD-10-CM | POA: Insufficient documentation

## 2018-07-25 DIAGNOSIS — Z923 Personal history of irradiation: Secondary | ICD-10-CM | POA: Insufficient documentation

## 2018-07-25 DIAGNOSIS — E785 Hyperlipidemia, unspecified: Secondary | ICD-10-CM | POA: Insufficient documentation

## 2018-07-25 DIAGNOSIS — D225 Melanocytic nevi of trunk: Secondary | ICD-10-CM | POA: Insufficient documentation

## 2018-07-25 DIAGNOSIS — F419 Anxiety disorder, unspecified: Secondary | ICD-10-CM | POA: Insufficient documentation

## 2018-07-25 DIAGNOSIS — R51 Headache: Secondary | ICD-10-CM | POA: Diagnosis not present

## 2018-07-25 DIAGNOSIS — Z90710 Acquired absence of both cervix and uterus: Secondary | ICD-10-CM | POA: Insufficient documentation

## 2018-07-25 DIAGNOSIS — D235 Other benign neoplasm of skin of trunk: Secondary | ICD-10-CM | POA: Diagnosis not present

## 2018-07-25 DIAGNOSIS — Z853 Personal history of malignant neoplasm of breast: Secondary | ICD-10-CM | POA: Diagnosis not present

## 2018-07-25 DIAGNOSIS — Z421 Encounter for breast reconstruction following mastectomy: Secondary | ICD-10-CM | POA: Insufficient documentation

## 2018-07-25 DIAGNOSIS — D367 Benign neoplasm of other specified sites: Secondary | ICD-10-CM | POA: Diagnosis not present

## 2018-07-25 DIAGNOSIS — Z170 Estrogen receptor positive status [ER+]: Secondary | ICD-10-CM | POA: Diagnosis not present

## 2018-07-25 HISTORY — PX: REMOVAL OF TISSUE EXPANDER AND PLACEMENT OF IMPLANT: SHX6457

## 2018-07-25 HISTORY — PX: LIPOSUCTION WITH LIPOFILLING: SHX6436

## 2018-07-25 SURGERY — REMOVAL, TISSUE EXPANDER, BREAST, WITH IMPLANT INSERTION
Anesthesia: General | Site: Breast

## 2018-07-25 MED ORDER — MEPERIDINE HCL 25 MG/ML IJ SOLN: 6.2500 mg | INTRAMUSCULAR | Status: DC | PRN

## 2018-07-25 MED ORDER — CHLORHEXIDINE GLUCONATE CLOTH 2 % EX PADS: 6.0000 | MEDICATED_PAD | Freq: Once | CUTANEOUS | Status: DC

## 2018-07-25 MED ORDER — ROCURONIUM BROMIDE 50 MG/5ML IV SOSY
PREFILLED_SYRINGE | INTRAVENOUS | Status: AC
  Filled 2018-07-25: qty 5

## 2018-07-25 MED ORDER — GABAPENTIN 300 MG PO CAPS
300.0000 mg | ORAL_CAPSULE | ORAL | Status: AC
  Administered 2018-07-25: 300 mg via ORAL

## 2018-07-25 MED ORDER — GABAPENTIN 300 MG PO CAPS
ORAL_CAPSULE | ORAL | Status: AC
  Filled 2018-07-25: qty 1

## 2018-07-25 MED ORDER — PHENYLEPHRINE HCL 10 MG/ML IJ SOLN
INTRAMUSCULAR | Status: AC
  Filled 2018-07-25: qty 1

## 2018-07-25 MED ORDER — GLYCOPYRROLATE PF 0.2 MG/ML IJ SOSY
PREFILLED_SYRINGE | INTRAMUSCULAR | Status: AC
  Filled 2018-07-25: qty 1

## 2018-07-25 MED ORDER — HYDROMORPHONE HCL 1 MG/ML IJ SOLN: INTRAMUSCULAR | Status: DC | PRN

## 2018-07-25 MED ORDER — SODIUM BICARBONATE 4 % IV SOLN
INTRAVENOUS | Status: DC | PRN
  Administered 2018-07-25: 300 mL via INTRAMUSCULAR

## 2018-07-25 MED ORDER — SODIUM BICARBONATE 4 % IV SOLN
INTRAVENOUS | Status: AC
  Filled 2018-07-25: qty 10

## 2018-07-25 MED ORDER — PROPOFOL 10 MG/ML IV BOLUS
INTRAVENOUS | Status: DC | PRN
  Administered 2018-07-25: 150 mg via INTRAVENOUS
  Administered 2018-07-25: 30 mg via INTRAVENOUS

## 2018-07-25 MED ORDER — FENTANYL CITRATE (PF) 100 MCG/2ML IJ SOLN: 50.0000 ug | INTRAMUSCULAR | Status: DC | PRN

## 2018-07-25 MED ORDER — CELECOXIB 200 MG PO CAPS
ORAL_CAPSULE | ORAL | Status: AC
  Filled 2018-07-25: qty 1

## 2018-07-25 MED ORDER — SODIUM CHLORIDE 0.9 % IJ SOLN
INTRAMUSCULAR | Status: AC
  Filled 2018-07-25: qty 10

## 2018-07-25 MED ORDER — PROMETHAZINE HCL 25 MG/ML IJ SOLN: 6.2500 mg | INTRAMUSCULAR | Status: DC | PRN

## 2018-07-25 MED ORDER — HYDROMORPHONE HCL 1 MG/ML IJ SOLN
INTRAMUSCULAR | Status: AC
  Filled 2018-07-25: qty 1

## 2018-07-25 MED ORDER — PHENYLEPHRINE 40 MCG/ML (10ML) SYRINGE FOR IV PUSH (FOR BLOOD PRESSURE SUPPORT)
PREFILLED_SYRINGE | INTRAVENOUS | Status: AC
  Filled 2018-07-25: qty 10

## 2018-07-25 MED ORDER — PROPOFOL 500 MG/50ML IV EMUL
INTRAVENOUS | Status: DC | PRN
  Administered 2018-07-25 (×2): 25 ug/kg/min via INTRAVENOUS

## 2018-07-25 MED ORDER — HYDROMORPHONE HCL 1 MG/ML IJ SOLN
0.2500 mg | INTRAMUSCULAR | Status: DC | PRN
  Administered 2018-07-25: 0.5 mg via INTRAVENOUS

## 2018-07-25 MED ORDER — ACETAMINOPHEN 500 MG PO TABS
ORAL_TABLET | ORAL | Status: AC
  Filled 2018-07-25: qty 2

## 2018-07-25 MED ORDER — MIDAZOLAM HCL 2 MG/2ML IJ SOLN: 1.0000 mg | INTRAMUSCULAR | Status: DC | PRN

## 2018-07-25 MED ORDER — HYDROMORPHONE HCL 1 MG/ML IJ SOLN
INTRAMUSCULAR | Status: AC
  Filled 2018-07-25: qty 0.5

## 2018-07-25 MED ORDER — SCOPOLAMINE 1 MG/3DAYS TD PT72: 1.0000 | MEDICATED_PATCH | Freq: Once | TRANSDERMAL | Status: DC | PRN

## 2018-07-25 MED ORDER — LIDOCAINE HCL (PF) 1 % IJ SOLN
INTRAMUSCULAR | Status: AC
  Filled 2018-07-25: qty 60

## 2018-07-25 MED ORDER — ROCURONIUM BROMIDE 100 MG/10ML IV SOLN
INTRAVENOUS | Status: DC | PRN
  Administered 2018-07-25: 50 mg via INTRAVENOUS

## 2018-07-25 MED ORDER — SODIUM CHLORIDE 0.9 % IV SOLN
INTRAVENOUS | Status: DC | PRN
  Administered 2018-07-25: 1000 mL

## 2018-07-25 MED ORDER — SCOPOLAMINE 1 MG/3DAYS TD PT72
MEDICATED_PATCH | TRANSDERMAL | Status: DC | PRN
  Administered 2018-07-25: 1 via TRANSDERMAL

## 2018-07-25 MED ORDER — ACETAMINOPHEN 500 MG PO TABS
1000.0000 mg | ORAL_TABLET | ORAL | Status: AC
  Administered 2018-07-25: 1000 mg via ORAL

## 2018-07-25 MED ORDER — ONDANSETRON HCL 4 MG/2ML IJ SOLN
INTRAMUSCULAR | Status: DC | PRN
  Administered 2018-07-25: 4 mg via INTRAVENOUS

## 2018-07-25 MED ORDER — GLYCOPYRROLATE 0.2 MG/ML IJ SOLN
INTRAMUSCULAR | Status: DC | PRN
  Administered 2018-07-25 (×2): 0.1 mg via INTRAVENOUS

## 2018-07-25 MED ORDER — BUPIVACAINE-EPINEPHRINE (PF) 0.25% -1:200000 IJ SOLN
INTRAMUSCULAR | Status: AC
  Filled 2018-07-25: qty 30

## 2018-07-25 MED ORDER — LIDOCAINE 2% (20 MG/ML) 5 ML SYRINGE
INTRAMUSCULAR | Status: AC
  Filled 2018-07-25: qty 5

## 2018-07-25 MED ORDER — VANCOMYCIN HCL IN DEXTROSE 1-5 GM/200ML-% IV SOLN
INTRAVENOUS | Status: AC
  Filled 2018-07-25: qty 200

## 2018-07-25 MED ORDER — CELECOXIB 200 MG PO CAPS
200.0000 mg | ORAL_CAPSULE | ORAL | Status: AC
  Administered 2018-07-25: 200 mg via ORAL

## 2018-07-25 MED ORDER — ONDANSETRON HCL 4 MG/2ML IJ SOLN
INTRAMUSCULAR | Status: AC
  Filled 2018-07-25: qty 2

## 2018-07-25 MED ORDER — LIDOCAINE HCL (CARDIAC) PF 100 MG/5ML IV SOSY
PREFILLED_SYRINGE | INTRAVENOUS | Status: DC | PRN
  Administered 2018-07-25: 40 mg via INTRAVENOUS

## 2018-07-25 MED ORDER — MIDAZOLAM HCL 2 MG/2ML IJ SOLN
INTRAMUSCULAR | Status: AC
  Filled 2018-07-25: qty 2

## 2018-07-25 MED ORDER — PROPOFOL 10 MG/ML IV BOLUS
INTRAVENOUS | Status: AC
  Filled 2018-07-25: qty 20

## 2018-07-25 MED ORDER — VANCOMYCIN HCL IN DEXTROSE 1-5 GM/200ML-% IV SOLN
1000.0000 mg | INTRAVENOUS | Status: AC
  Administered 2018-07-25 (×2): 1000 mg via INTRAVENOUS

## 2018-07-25 MED ORDER — MIDAZOLAM HCL 5 MG/5ML IJ SOLN
INTRAMUSCULAR | Status: DC | PRN
  Administered 2018-07-25: 2 mg via INTRAVENOUS

## 2018-07-25 MED ORDER — DEXAMETHASONE SODIUM PHOSPHATE 10 MG/ML IJ SOLN
INTRAMUSCULAR | Status: AC
  Filled 2018-07-25: qty 1

## 2018-07-25 MED ORDER — EPINEPHRINE 30 MG/30ML IJ SOLN
INTRAMUSCULAR | Status: AC
  Filled 2018-07-25: qty 1

## 2018-07-25 MED ORDER — FENTANYL CITRATE (PF) 100 MCG/2ML IJ SOLN
INTRAMUSCULAR | Status: AC
  Filled 2018-07-25: qty 2

## 2018-07-25 MED ORDER — SCOPOLAMINE 1 MG/3DAYS TD PT72
MEDICATED_PATCH | TRANSDERMAL | Status: AC
  Filled 2018-07-25: qty 1

## 2018-07-25 MED ORDER — DEXAMETHASONE SODIUM PHOSPHATE 4 MG/ML IJ SOLN
INTRAMUSCULAR | Status: DC | PRN
  Administered 2018-07-25: 10 mg via INTRAVENOUS

## 2018-07-25 MED ORDER — PHENYLEPHRINE HCL 10 MG/ML IJ SOLN
INTRAMUSCULAR | Status: DC | PRN
  Administered 2018-07-25 (×3): 80 ug via INTRAVENOUS

## 2018-07-25 MED ORDER — HYDROMORPHONE HCL 1 MG/ML IJ SOLN
INTRAMUSCULAR | Status: DC | PRN
  Administered 2018-07-25: .1 mg via INTRAVENOUS

## 2018-07-25 MED ORDER — LACTATED RINGERS IV SOLN
INTRAVENOUS | Status: DC
  Administered 2018-07-25 (×2): via INTRAVENOUS

## 2018-07-25 SURGICAL SUPPLY — 60 items
BAG DECANTER FOR FLEXI CONT (MISCELLANEOUS) ×3 IMPLANT
BINDER ABDOMINAL 10 UNV 27-48 (MISCELLANEOUS) ×3 IMPLANT
BINDER BREAST LRG (GAUZE/BANDAGES/DRESSINGS) IMPLANT
BINDER BREAST XLRG (GAUZE/BANDAGES/DRESSINGS) ×3 IMPLANT
BLADE SURG 10 STRL SS (BLADE) ×3 IMPLANT
BLADE SURG 11 STRL SS (BLADE) ×3 IMPLANT
BNDG GAUZE ELAST 4 BULKY (GAUZE/BANDAGES/DRESSINGS) ×6 IMPLANT
CANISTER LIPO FAT HARVEST (MISCELLANEOUS) ×3 IMPLANT
CANISTER SUCT 1200ML W/VALVE (MISCELLANEOUS) ×3 IMPLANT
CHLORAPREP W/TINT 26ML (MISCELLANEOUS) ×3 IMPLANT
COVER BACK TABLE 60X90IN (DRAPES) ×3 IMPLANT
COVER MAYO STAND STRL (DRAPES) ×6 IMPLANT
DERMABOND ADVANCED (GAUZE/BANDAGES/DRESSINGS) ×2
DERMABOND ADVANCED .7 DNX12 (GAUZE/BANDAGES/DRESSINGS) ×4 IMPLANT
DRAPE TOP ARMCOVERS (MISCELLANEOUS) ×3 IMPLANT
DRAPE U-SHAPE 76X120 STRL (DRAPES) ×3 IMPLANT
DRSG PAD ABDOMINAL 8X10 ST (GAUZE/BANDAGES/DRESSINGS) ×6 IMPLANT
ELECT BLADE 4.0 EZ CLEAN MEGAD (MISCELLANEOUS) ×3
ELECT COATED BLADE 2.86 ST (ELECTRODE) ×3 IMPLANT
ELECT REM PT RETURN 9FT ADLT (ELECTROSURGICAL) ×3
ELECTRODE BLDE 4.0 EZ CLN MEGD (MISCELLANEOUS) ×2 IMPLANT
ELECTRODE REM PT RTRN 9FT ADLT (ELECTROSURGICAL) ×2 IMPLANT
GLOVE BIOGEL PI IND STRL 7.0 (GLOVE) ×2 IMPLANT
GLOVE BIOGEL PI INDICATOR 7.0 (GLOVE) ×1
GLOVE SURG SS PI 6.0 STRL IVOR (GLOVE) ×6 IMPLANT
GLOVE SURG SS PI 6.5 STRL IVOR (GLOVE) ×3 IMPLANT
GOWN STRL REUS W/ TWL LRG LVL3 (GOWN DISPOSABLE) ×4 IMPLANT
GOWN STRL REUS W/TWL LRG LVL3 (GOWN DISPOSABLE) ×2
IMPL BREAST FULL RND 415 (Breast) ×4 IMPLANT
IMPL BRST FULL RND 415CC (Breast) ×4 IMPLANT
IMPLANT BREAST GEL 415CC (Breast) ×2 IMPLANT
LINER CANISTER 1000CC FLEX (MISCELLANEOUS) ×3 IMPLANT
NDL SAFETY ECLIPSE 18X1.5 (NEEDLE) ×2 IMPLANT
NEEDLE HYPO 18GX1.5 SHARP (NEEDLE) ×1
NS IRRIG 1000ML POUR BTL (IV SOLUTION) ×3 IMPLANT
PACK BASIN DAY SURGERY FS (CUSTOM PROCEDURE TRAY) ×3 IMPLANT
PAD ALCOHOL SWAB (MISCELLANEOUS) ×3 IMPLANT
PENCIL BUTTON HOLSTER BLD 10FT (ELECTRODE) ×3 IMPLANT
SHEET MEDIUM DRAPE 40X70 STRL (DRAPES) ×6 IMPLANT
SIZER BREAST REUSE 385CC (SIZER) ×3
SIZER BREAST REUSE 415CC (SIZER) ×1
SIZER BRST REUSE 385CC (SIZER) ×2 IMPLANT
SIZER BRST REUSE P5.1X 415CC (SIZER) ×2 IMPLANT
SLEEVE SCD COMPRESS KNEE MED (MISCELLANEOUS) ×3 IMPLANT
SPONGE LAP 18X18 RF (DISPOSABLE) ×9 IMPLANT
STAPLER VISISTAT 35W (STAPLE) ×3 IMPLANT
SUT MNCRL AB 4-0 PS2 18 (SUTURE) ×6 IMPLANT
SUT VIC AB 3-0 SH 27 (SUTURE) ×2
SUT VIC AB 3-0 SH 27X BRD (SUTURE) ×4 IMPLANT
SUT VICRYL 4-0 PS2 18IN ABS (SUTURE) ×6 IMPLANT
SYR 10ML LL (SYRINGE) ×12 IMPLANT
SYR 50ML LL SCALE MARK (SYRINGE) ×9 IMPLANT
SYR BULB IRRIGATION 50ML (SYRINGE) ×6 IMPLANT
SYR TB 1ML LL NO SAFETY (SYRINGE) ×3 IMPLANT
TOWEL GREEN STERILE FF (TOWEL DISPOSABLE) ×6 IMPLANT
TUBE CONNECTING 20X1/4 (TUBING) ×6 IMPLANT
TUBING INFILTRATION IT-10001 (TUBING) ×3 IMPLANT
TUBING SET GRADUATE ASPIR 12FT (MISCELLANEOUS) ×3 IMPLANT
UNDERPAD 30X30 (UNDERPADS AND DIAPERS) ×6 IMPLANT
YANKAUER SUCT BULB TIP NO VENT (SUCTIONS) ×3 IMPLANT

## 2018-07-25 NOTE — Op Note (Signed)
Operative Note   DATE OF OPERATION: 10.1.19  LOCATION: [facility]-outpatient  SURGICAL DIVISION: Plastic Surgery  PREOPERATIVE DIAGNOSES:  1. History breast cancer 2. Acquired absence breasts 3. History therapeutic radiation 4. Benign lesion abdomen  POSTOPERATIVE DIAGNOSES:  same  PROCEDURE:  1. Excision benign lesion abdomen 0.6 cm 2. Layered closure abdomen 1 cm 3. Removal bilateral tissue expanders and placement silicone implants 4. Lipofilling to bilateral chest.  SURGEON: Irene Limbo MD MBA  ASSISTANT: none  ANESTHESIA:  General.   EBL: 35 ml  COMPLICATIONS: None immediate.   INDICATIONS FOR PROCEDURE:  The patient, Katelyn Shelton, is a 42 y.o. female born on May 13, 1976, is here for staged breast reconstruction following bilateral nipple sparing mastectomies with immediate prepectoral expander acellular dermis reconstruction. She had a positive margin and required resection right NAC. She received adjuvant radiation to right chest.    FINDINGS: 55 ml fat infiltrated over right chest, 40 ml over left chest. Natrelle Inspira Full Projection 415 ml implants placed bilateral. REF [id number] RIGHT SN [id number] LEFT SN [id number]  DESCRIPTION OF PROCEDURE:  The patient's operative site was marked with the patient in the preoperative area to mark sternal notch, chest midline, anterior axillary lines.Supra and infraumbilcal abdomen marked for liposuction.The patientwas taken to the operating room. SCDs were placed and IV antibiotics were given. The patient's operative site was prepped and draped in a sterile fashion. A time out was performed and all information was confirmed to be correct.I began onleftside. Incision made through prior inframammary fold scar and carried through superficial fascia to acellular demis. ADM incised. Expander removed. Full incorporation ADM noted. Thermal capsulorraphy performed to lateral capsule. Sizer placed.  Sharp excision left upper abdomen  skin lesion completed with margins diameter 0.6 cm. Layered closure completed with 4-0 vicryl in dermis and 4-0 monocryl subcuticular skin closure length 1 cm.  I then directed attention torightchest. Incision made in prior inframammary fold scar and implant cavity entered in similar manner. Expander removed and well incorporated ADM noted. Capsulotomy performed over superior pole. Sizer placed and patient brought to upright sitting position. Natrelle Smooth Round Full Projection 415 ml implants selected for bilateral placement.The patient was returned to supine position.  Stab incision made over bilateralabdomen in prior low transverse scar. Tumescent fluid infiltrated over supra and infraumbilical [id number] tumescent infiltrated. Power assisted liposuction performed to endpoint symmetric contour and soft tissue thickness, total lipoaspirate 225 ml. The fat was then washed and prepared by gravity for infiltration. Harvested fat was then infiltrated in subcutaneous plane throughout total envelope mastectomy flaps.  Each cavity irrigated with bacitracin, Ancef, gentamicinsolution.Hemostasis ensured.Each cavity then irrigated with Betadine. The implant was placed inrightchest andimplant orientation ensured. Closure completed with 3-0 vicryl to close superficial fascia and ADM over implant. 4-0 vicryl used to close dermis followed by 4-0 monocryl subcuticular. Implant placed in left chest cavity.Closure completedin similar fashion.Abdomen incisions approximated with simple 4-0 monocryl stitch.Tissue adhesive and dry dressing applied, followed by breastbinder and abdominal compression garment.  The patient was allowed to wake from anesthesia, extubated and taken to the recovery room in satisfactory condition.   SPECIMENS: abdomen nevus  DRAINS: none  Irene Limbo, MD [facility]astic [facility] Re[facility] Surgery [phone], pin [phone]

## 2018-07-25 NOTE — Discharge Instructions (Signed)
°  Post Anesthesia Home Care Instructions  Activity: Get plenty of rest for the remainder of the day. A responsible individual must stay with you for 24 hours following the procedure.  For the next 24 hours, DO NOT: -Drive a car -Operate machinery -Drink alcoholic beverages -Take any medication unless instructed by your physician -Make any legal decisions or sign important papers.  Meals: Start with liquid foods such as gelatin or soup. Progress to regular foods as tolerated. Avoid greasy, spicy, heavy foods. If nausea and/or vomiting occur, drink only clear liquids until the nausea and/or vomiting subsides. Call your physician if vomiting continues.  Special Instructions/Symptoms: Your throat may feel dry or sore from the anesthesia or the breathing tube placed in your throat during surgery. If this causes discomfort, gargle with warm salt water. The discomfort should disappear within 24 hours.  If you had a scopolamine patch placed behind your ear for the management of post- operative nausea and/or vomiting:  1. The medication in the patch is effective for 72 hours, after which it should be removed.  Wrap patch in a tissue and discard in the trash. Wash hands thoroughly with soap and water. 2. You may remove the patch earlier than 72 hours if you experience unpleasant side effects which may include dry mouth, dizziness or visual disturbances. 3. Avoid touching the patch. Wash your hands with soap and water after contact with the patch.    Post Anesthesia Home Care Instructions  Activity: Get plenty of rest for the remainder of the day. A responsible individual must stay with you for 24 hours following the procedure.  For the next 24 hours, DO NOT: -Drive a car -Operate machinery -Drink alcoholic beverages -Take any medication unless instructed by your physician -Make any legal decisions or sign important papers.  Meals: Start with liquid foods such as gelatin or soup. Progress to  regular foods as tolerated. Avoid greasy, spicy, heavy foods. If nausea and/or vomiting occur, drink only clear liquids until the nausea and/or vomiting subsides. Call your physician if vomiting continues.  Special Instructions/Symptoms: Your throat may feel dry or sore from the anesthesia or the breathing tube placed in your throat during surgery. If this causes discomfort, gargle with warm salt water. The discomfort should disappear within 24 hours.  If you had a scopolamine patch placed behind your ear for the management of post- operative nausea and/or vomiting:  1. The medication in the patch is effective for 72 hours, after which it should be removed.  Wrap patch in a tissue and discard in the trash. Wash hands thoroughly with soap and water. 2. You may remove the patch earlier than 72 hours if you experience unpleasant side effects which may include dry mouth, dizziness or visual disturbances. 3. Avoid touching the patch. Wash your hands with soap and water after contact with the patch.    

## 2018-07-25 NOTE — Anesthesia Preprocedure Evaluation (Addendum)
Anesthesia Evaluation  Patient identified by MRN, date of birth, ID band Patient awake    Reviewed: Allergy & Precautions, NPO status , Patient's Chart, lab work & pertinent test results  Airway Mallampati: II  TM Distance: >3 FB Neck ROM: Full    Dental no notable dental hx.    Pulmonary    Pulmonary exam normal breath sounds clear to auscultation       Cardiovascular Normal cardiovascular exam+ Valvular Problems/Murmurs  Rhythm:Regular Rate:Normal  HLD   Neuro/Psych  Headaches, Anxiety    GI/Hepatic negative GI ROS, Neg liver ROS,   Endo/Other  negative endocrine ROS  Renal/GU negative Renal ROS  negative genitourinary   Musculoskeletal negative musculoskeletal ROS (+)   Abdominal   Peds  Hematology negative hematology ROS (+)   Anesthesia Other Findings Bilateral breast reconstruction, right breast cancer  Reproductive/Obstetrics                                                             Anesthesia Evaluation  Patient identified by MRN, date of birth, ID band Patient awake    Reviewed: Allergy & Precautions, NPO status , Patient's Chart, lab work & pertinent test results  Airway Mallampati: I       Dental no notable dental hx. (+) Teeth Intact   Pulmonary neg pulmonary ROS,    Pulmonary exam normal breath sounds clear to auscultation       Cardiovascular negative cardio ROS Normal cardiovascular exam Rhythm:Regular Rate:Normal     Neuro/Psych    GI/Hepatic negative GI ROS, Neg liver ROS,   Endo/Other  negative endocrine ROS  Renal/GU negative Renal ROS  negative genitourinary   Musculoskeletal negative musculoskeletal ROS (+)   Abdominal Normal abdominal exam  (+)   Peds  Hematology negative hematology ROS (+)   Anesthesia Other Findings   Reproductive/Obstetrics                            Anesthesia  Physical Anesthesia Plan  ASA: II  Anesthesia Plan: General   Post-op Pain Management:    Induction: Intravenous  PONV Risk Score and Plan: 4 or greater and Ondansetron, Dexamethasone, Midazolam and Scopolamine patch - Pre-op  Airway Management Planned: Oral ETT  Additional Equipment:   Intra-op Plan:   Post-operative Plan: Extubation in OR  Informed Consent: I have reviewed the patients History and Physical, chart, labs and discussed the procedure including the risks, benefits and alternatives for the proposed anesthesia with the patient or authorized representative who has indicated his/her understanding and acceptance.   Dental advisory given  Plan Discussed with: CRNA and Surgeon  Anesthesia Plan Comments:        Anesthesia Quick Evaluation  Anesthesia Physical Anesthesia Plan  ASA: II  Anesthesia Plan: General   Post-op Pain Management:    Induction: Intravenous  PONV Risk Score and Plan: 3 and Ondansetron, Dexamethasone, Midazolam and Scopolamine patch - Pre-op  Airway Management Planned: Oral ETT  Additional Equipment:   Intra-op Plan:   Post-operative Plan: Extubation in OR  Informed Consent: I have reviewed the patients History and Physical, chart, labs and discussed the procedure including the risks, benefits and alternatives for the proposed anesthesia with the patient or authorized representative who has indicated his/her understanding  and acceptance.   Dental advisory given  Plan Discussed with: CRNA  Anesthesia Plan Comments:         Anesthesia Quick Evaluation

## 2018-07-25 NOTE — Anesthesia Postprocedure Evaluation (Signed)
Anesthesia Post Note  Patient: Katelyn Shelton  Procedure(s) Performed: REMOVAL OF TISSUE EXPANDER AND PLACEMENT OF SILICONE IMPLANT (Bilateral Breast) LIPOSUCTION WITH LIPOFILLING FROM ABDOMEN TO CHEST (N/A Abdomen)     Patient location during evaluation: PACU Anesthesia Type: General Level of consciousness: sedated and patient cooperative Pain management: pain level controlled Vital Signs Assessment: post-procedure vital signs reviewed and stable Respiratory status: spontaneous breathing Cardiovascular status: stable Anesthetic complications: no    Last Vitals:  Vitals:   07/25/18 1030 07/25/18 1038  BP: 132/81   Pulse: 86 84  Resp: (!) 21 15  Temp:    SpO2: 100% 99%    Last Pain:  Vitals:   07/25/18 0634  TempSrc: Oral                 Nolon Nations

## 2018-07-25 NOTE — Transfer of Care (Signed)
Immediate Anesthesia Transfer of Care Note  Patient: Katelyn Shelton  Procedure(s) Performed: REMOVAL OF TISSUE EXPANDER AND PLACEMENT OF SILICONE IMPLANT (Bilateral Breast) LIPOSUCTION WITH LIPOFILLING FROM ABDOMEN TO CHEST (N/A Abdomen)  Patient Location: PACU  Anesthesia Type:General  Level of Consciousness: awake and patient cooperative  Airway & Oxygen Therapy: Patient Spontanous Breathing and Patient connected to face mask oxygen  Post-op Assessment: Report given to RN and Post -op Vital signs reviewed and stable  Post vital signs: Reviewed and stable  Last Vitals:  Vitals Value Taken Time  BP    Temp    Pulse    Resp 18 07/25/2018 10:01 AM  SpO2    Vitals shown include unvalidated device data.  Last Pain:  Vitals:   07/25/18 0634  TempSrc: Oral         Complications: No apparent anesthesia complications

## 2018-07-25 NOTE — Anesthesia Procedure Notes (Signed)
Procedure Name: Intubation Date/Time: 07/25/2018 7:43 AM Performed by: Marrianne Mood, CRNA Pre-anesthesia Checklist: Patient identified, Emergency Drugs available, Suction available, Patient being monitored and Timeout performed Patient Re-evaluated:Patient Re-evaluated prior to induction Oxygen Delivery Method: Circle system utilized Preoxygenation: Pre-oxygenation with 100% oxygen Induction Type: IV induction Ventilation: Mask ventilation without difficulty Laryngoscope Size: Mac and 3 Grade View: Grade II Tube type: Oral Number of attempts: 1 Airway Equipment and Method: Stylet and Oral airway Placement Confirmation: ETT inserted through vocal cords under direct vision,  positive ETCO2 and breath sounds checked- equal and bilateral Secured at: 22 cm Tube secured with: Tape Dental Injury: Teeth and Oropharynx as per pre-operative assessment

## 2018-07-25 NOTE — Interval H&P Note (Signed)
History and Physical Interval Note:  07/25/2018 6:42 AM  Katelyn Shelton  has presented today for surgery, with the diagnosis of HISTORY OF BREAST CANCER,ACQUIRED ABSCENCE OF BEAST AND HISTORY OF THERAPUETIC RADIATION  The various methods of treatment have been discussed with the patient and family. After consideration of risks, benefits and other options for treatment, the patient has consented to  Removal bilateral tissue expanders and placement silicone implants, lipofilling from abdomen to bilateral chest as a surgical intervention .  The patient's history has been reviewed, patient examined, no change in status, stable for surgery.  I have reviewed the patient's chart and labs.  Questions were answered to the patient's satisfaction.     Katelyn Shelton

## 2018-07-26 ENCOUNTER — Encounter (HOSPITAL_BASED_OUTPATIENT_CLINIC_OR_DEPARTMENT_OTHER): Payer: Self-pay | Admitting: Plastic Surgery

## 2018-08-14 MED FILL — REPATHA 140 MG/1 ML INJ: 140 | 28 days supply | Qty: 2 | Fill #3

## 2018-08-17 ENCOUNTER — Ambulatory Visit (INDEPENDENT_AMBULATORY_CARE_PROVIDER_SITE_OTHER): Payer: 59 | Admitting: Licensed Clinical Social Worker

## 2018-08-17 DIAGNOSIS — F324 Major depressive disorder, single episode, in partial remission: Secondary | ICD-10-CM | POA: Diagnosis not present

## 2018-09-06 DIAGNOSIS — H5213 Myopia, bilateral: Secondary | ICD-10-CM | POA: Diagnosis not present

## 2018-09-06 DIAGNOSIS — H52223 Regular astigmatism, bilateral: Secondary | ICD-10-CM | POA: Diagnosis not present

## 2018-09-14 MED FILL — ANASTROZOLE 1 MG TABLET: 1 | 90 days supply | Qty: 90 | Fill #4

## 2018-09-14 MED FILL — REPATHA 140 MG/1 ML INJ: 140 | 28 days supply | Qty: 2 | Fill #4

## 2018-09-14 NOTE — Progress Notes (Signed)
Katelyn Shelton  [phone] [phone]     ID: Katelyn Shelton DOB: 1976/07/27  MR#: [id number]  NTZ#:[id number]  Patient Care Team: Marin Olp, MD as PCP - General (Family Medicine) Livianna Petraglia, Virgie Dad, MD as Consulting Physician (Oncology) Rolm Bookbinder, MD as Consulting Physician (General Surgery) Louretta Shorten, MD as Consulting Physician (Obstetrics and Gynecology) Juanita Craver Gerrie Nordmann, MD as Referring Physician (Hematology and Oncology) Kyung Rudd, MD as Consulting Physician (Radiation Oncology) Irene Limbo, MD as Consulting Physician (Plastic Surgery) Sorscher, Danice Goltz, MD as Referring Physician (Hematology and Oncology) Muss, Demaris Callander as Consulting Physician (Internal Medicine) Kathrynn Ducking, MD as Consulting Physician (Neurology) Dorothy Spark, MD as Consulting Physician (Cardiology) OTHER MD:  CHIEF COMPLAINT: Estrogen receptor positive breast cancer  CURRENT TREATMENT: anastrozole   INTERVAL HISTORY: Katelyn Shelton returns today for follow-up and treatment of her estrogen receptor positive breast cancer.  The patient continues her anastrozole treatment, with little tolerance. She experiences hot flashes. She also feels a lot of joint pains over the entirety of her body.  It can wake her up at night.  She feels hesitant to come off the medication because she does not want the cancer to come back.   Her goserelin was stopped in August in anticipation of surgery.  She underwent a hysterectomy ([id number]) on 06/29/2018: Uterus, cervix and bilateral fallopian tubes, and ovaries showing: uterus with endometrium: weakly proliferative endometrium. No hyperplasia or malignancy. Myometrium: unremarkable. No malignancy. Serosa: Unremarkable, no malignancy. Cervix: Benign squamous and endocervical mucosa. No dysplasia or malignancy. Bilateral ovaries: Cystic follicles. No malignancy. Bilateral fallopian tubes: unremarkable. No  malignancy.  She also had expander removal and bilateral silicone implant placement 07/25/2018.  She feels the right side is larger than the left, does not hang the same way, and is too tight.  Otherwise she tolerated that surgery well  She is due for a Chest CT, which is scheduled for 10/10/2018.   REVIEW OF SYSTEMS: [name] feels well overall. She had no fever. For exercise, she goes to the gym and runs/walks for about 30 minutes. Then, she goes home and walks her dog for two miles. She experiences hot flashes, which keeps her up at night, but she denies fatigue overall. She finds it difficult to remember to take medicine before bed. She is having no trouble sleeping without medication. She has started a new medication her her cholesterol, with great results. She had silicone implants put in, and isn't happy with her radiated side, but will speak with the surgeon about it on 10/05/2018. She feels like her implant is too large; "It feels tight." She feels about a 5 or 6 out of 10 in regards to the surgery. The patient denies unusual headaches, visual changes, nausea, vomiting, or dizziness. There has been no unusual cough, phlegm production, or pleurisy. This been no change in bowel or bladder habits. The patient denies unexplained weight loss, bleeding, or fever. A detailed review of systems was otherwise noncontributory.    BREAST CANCER HISTORY: From the original intake note:  "Katelyn Shelton" saw Dr. Corinna Capra for routine follow-up and was found to have a palpable mass in the upper outer quadrant of her right breast and was set up for bilateral diagnostic mammography with tomography and right breast ultrasonography at the [facility] 02/08/2017. The breast density was category C. In the upper outer quadrant of the right breast there was an irregular mass. There was also a 0.3 cm group of slightly heterogeneous calcifications in the  retroareolar left breast. On exam there was indeed a firm palpable mass at the  10:00 position of the right breast 4 cm from the nipple. On ultrasound this measured 3.5 cm, it was irregular and hypoechoic. There was no abnormal right axillary adenopathy.  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 The area of left breast calcifications was biopsied 02/09/2017 and showed only fibrocystic changes. ([id number]).  Her subsequent history is as detailed below.  PAST MEDICAL HISTORY: Past Medical History:  Diagnosis Date  . Allergy   . Anxiety    venflafaxine XR 75 mg  . Breast cancer ([place]) 01/2017   right; genetic testing negative in 2018 with [facility] panel  . Heart murmur    as child- went away  . History of MRSA infection    elbow  . Hyperlipidemia   . Joint pain    due to medicine induced joint pain  . Migraine    otc med prn  . Miscarriage    x1  . Neuromuscular disorder ([place])    neuropathy bilater feet - ? 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 Osteopenia   . UTI (urinary tract infection)     PAST SURGICAL HISTORY: Past Surgical History:  Procedure Laterality Date  . BREAST RECONSTRUCTION WITH PLACEMENT OF TISSUE EXPANDER AND FLEX HD (ACELLULAR HYDRATED DERMIS) Bilateral 02/28/2017   Procedure: BILATERAL BREAST RECONSTRUCTION WITH PLACEMENT OF TISSUE EXPANDER AND ALLODERM;  Surgeon: Irene Limbo, MD;  Location: [place];  Service: Plastics;  Laterality: Bilateral;  . CESAREAN SECTION  2008,2010,2013   x 3  . CESAREAN SECTION  02/04/2012   Procedure: CESAREAN SECTION;  Surgeon: Cyril Mourning, MD;  Location: [facility];  Service: Gynecology;  Laterality: N/A;  Repeat Cesarean Section Delivery  Boy  @  [phone], Apgars 9/10  . COLONOSCOPY     polyp  . DILATION AND CURETTAGE OF UTERUS    . EXCISION OF BREAST LESION Right 03/15/2017   Procedure:  RIGHT NIPPLE AND AREOLA EXCISION;  Surgeon: Rolm Bookbinder, MD;  Location: [facility];  Service: General;  Laterality: Right;  . LAPAROSCOPIC VAGINAL HYSTERECTOMY WITH SALPINGO OOPHORECTOMY Bilateral 06/29/2018   Procedure: LAPAROSCOPIC ASSISTED VAGINAL HYSTERECTOMY WITH SALPINGO OOPHORECTOMY;  Surgeon: Louretta Shorten, MD;  Location: [facility];  Service: Gynecology;  Laterality: Bilateral;  Request case for [facility] CRNA  . LIPOSUCTION WITH LIPOFILLING N/A 07/25/2018   Procedure: LIPOSUCTION WITH LIPOFILLING FROM ABDOMEN TO CHEST;  Surgeon: Irene Limbo, MD;  Location: [facility];  Service: Plastics;  Laterality: N/A;  . NIPPLE SPARING MASTECTOMY Bilateral 02/28/2017   Procedure: RIGHT NIPPLE SPARING MASTECTOMY; LEFT PROPHYLACTIC NIPPLE SPARING MASTECTOMY;  Surgeon: Rolm Bookbinder, MD;  Location: [facility];  Service: General;  Laterality: Bilateral;  . PECTUS EXCAVATUM REPAIR  4128   42 years old- very large implant  . REMOVAL OF TISSUE EXPANDER AND PLACEMENT OF IMPLANT Bilateral 07/25/2018   Procedure: REMOVAL OF TISSUE EXPANDER AND PLACEMENT OF SILICONE IMPLANT;  Surgeon: Irene Limbo, MD;  Location: [facility];  Service: Plastics;  Laterality: Bilateral;  . SENTINEL NODE BIOPSY Right 02/28/2017   Procedure: RIGHT AXILLARY SENTINEL LYMPH  NODE BIOPSY;  Surgeon: Rolm Bookbinder, MD;  Location: [facility];  Service: General;  Laterality: Right;  . VULVAR LESION REMOVAL    .  WISDOM TOOTH EXTRACTION      FAMILY HISTORY Family History  Problem Relation Age of Onset  . Skin cancer Mother   . Hyperlipidemia Mother   . Hypertension Mother   . Colon polyps Mother   . Hyperlipidemia Father   . Parkinson's disease Sister 31       early on set  . Miscarriages / Stillbirths Sister   . Diabetes Maternal Aunt   . Colon polyps Maternal Uncle   . Heart failure Maternal Grandmother   . Stroke Maternal Grandmother   .  Heart attack Maternal Grandfather   . Depression Maternal Grandfather   . Early death Maternal Grandfather        97 MI  . Stomach cancer Paternal Grandmother   . Prostate cancer Paternal Grandfather   . Alzheimer's disease Paternal Grandfather   . Breast cancer Cousin        mother's paternal first FEMALE cousin  . Colon polyps Sister   . Colon cancer Neg Hx   . Rectal cancer Neg Hx    The patient's father, Carolyne Fiscal, is an Administrator, Civil Service, currently 41 years old. The patient's mother is 33 years old as of April 2018. Patient has no brothers, 2 sisters. There is a history breast cancer in the family in a great aunt on the patient's father's side. On the maternal side there is a female first cousin of the patient's mother with breast cancer. There is no history of ovarian cancer in the family  GYNECOLOGIC HISTORY:  Patient's last menstrual period was 09/01/2017. Katelyn Shelton had her first menstrual period age 54, her first live birth age 29. She has had 3 C-sections. Her periods are approximately every 21 days, with one heavy day. Her husband is status post vasectomy.  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 Her husband [name] is one of our cardiologists. The patient attends [employer] [place] DIRECTIVES:    HEALTH MAINTENANCE: Social History   Tobacco Use  . Smoking status: Never Smoker  . Smokeless tobacco: Never Used  Substance Use Topics  . Alcohol use: Yes    Alcohol/week: 1.0 - 2.0 standard drinks    Types: 1 - 2 Standard drinks or equivalent per week    Comment: occasional wine - twice monthly  . Drug use: No     Colonoscopy:  PAP:  Bone density: 11/25/2017 showed a T score of -2.0 osteopenic   Allergies  Allergen Reactions  . Fentanyl Itching    Severe itching - Patient refuses this medication  . Peach Flavor Anaphylaxis  . Penicillins Hives    AS A CHILD  . Sulfa Antibiotics Nausea And Vomiting   . Sulfasalazine Nausea And Vomiting and Other (See Comments)  . Latex Itching    Current Outpatient Medications  Medication Sig Dispense Refill  . acetaminophen (TYLENOL) 325 MG tablet Take 650 mg by mouth every 6 (six) hours as needed.    [name] anastrozole (ARIMIDEX) 1 MG tablet Take 1 tablet (1 mg total) by mouth daily. 90 tablet 4  . Calcium Carbonate (CALCIUM 600 PO) Take 1 capsule by mouth daily.     [name] denosumab (PROLIA) 60 MG/ML SOSY injection Inject 60 mg into the skin every 6 (six) months.    . Evolocumab (REPATHA SURECLICK) 253 MG/ML SOAJ Inject 1 pen into the skin every 14 (fourteen) days. 2 pen 11  . gabapentin (NEURONTIN) 300 MG capsule Take 1  capsule (300 mg total) by mouth at bedtime. 90 capsule 4  . ibuprofen (ADVIL,MOTRIN) 600 MG tablet Take 1 tablet (600 mg total) by mouth every 6 (six) hours as needed for mild pain or moderate pain (mild pain). 30 tablet 0  . Multiple Vitamins-Minerals (MULTIVITAMIN WOMEN 50+) TABS Take 1 tablet by mouth daily.     [name] venlafaxine XR (EFFEXOR-XR) 75 MG 24 hr capsule Take 1 capsule (75 mg total) by mouth daily with breakfast. 90 capsule 3   No current facility-administered medications for this visit.     OBJECTIVE: Young white woman in no acute distress  Vitals:   09/19/18 1128  BP: 109/65  Pulse: 74  Resp: 18  Temp: 97.9 F (36.6 C)  SpO2: 100%     Body mass index is 26.35 kg/m.    ECOG FS:1 - Symptomatic but completely ambulatory   Sclerae unicteric, pupils round and equal Oropharynx clear and moist No cervical or supraclavicular adenopathy Lungs no rales or rhonchi Heart regular rate and rhythm Abd soft, nontender, positive bowel sounds MSK no focal spinal tenderness, no upper extremity lymphedema Neuro: nonfocal, well oriented, appropriate affect Breasts: Status post bilateral mastectomies with bilateral silicone implant reconstruction.  There is no evidence of local recurrence.  Both axillae are benign.  LAB  RESULTS:  CMP     Component Value Date/Time   NA 140 06/19/2018 1040   NA 140 08/19/2017 1337   K 3.7 06/19/2018 1040   K 3.6 08/19/2017 1337   CL 106 06/19/2018 1040   CO2 24 06/19/2018 1040   CO2 24 08/19/2017 1337   GLUCOSE 102 (H) 06/19/2018 1040   GLUCOSE 88 08/19/2017 1337   BUN 16 06/19/2018 1040   BUN 10.9 08/19/2017 1337   CREATININE 0.53 06/19/2018 1040   CREATININE 0.7 08/19/2017 1337   CALCIUM 9.2 06/19/2018 1040   CALCIUM 9.0 08/19/2017 1337   PROT 7.0 06/22/2018 0817   PROT 6.6 08/19/2017 1337   ALBUMIN 4.6 06/22/2018 0817   ALBUMIN 3.9 08/19/2017 1337   AST 25 06/22/2018 0817   AST 35 (H) 08/19/2017 1337   ALT 25 06/22/2018 0817   ALT 55 08/19/2017 1337   ALKPHOS 41 06/22/2018 0817   ALKPHOS 61 08/19/2017 1337   BILITOT 0.3 06/22/2018 0817   BILITOT 0.41 08/19/2017 1337   GFRNONAA >60 06/19/2018 1040   GFRAA >60 06/19/2018 1040    No results found for: TOTALPROTELP, ALBUMINELP, A1GS, A2GS, BETS, BETA2SER, GAMS, MSPIKE, SPEI  No results found for: Nils Pyle, [facility]  Lab Results  Component Value Date   WBC 4.4 09/19/2018   NEUTROABS 2.7 09/19/2018   HGB 13.9 09/19/2018   HCT 41.7 09/19/2018   MCV 94.8 09/19/2018   PLT 240 09/19/2018      Chemistry      Component Value Date/Time   NA 140 06/19/2018 1040   NA 140 08/19/2017 1337   K 3.7 06/19/2018 1040   K 3.6 08/19/2017 1337   CL 106 06/19/2018 1040   CO2 24 06/19/2018 1040   CO2 24 08/19/2017 1337   BUN 16 06/19/2018 1040   BUN 10.9 08/19/2017 1337   CREATININE 0.53 06/19/2018 1040   CREATININE 0.7 08/19/2017 1337      Component Value Date/Time   CALCIUM 9.2 06/19/2018 1040   CALCIUM 9.0 08/19/2017 1337   ALKPHOS 41 06/22/2018 0817   ALKPHOS 61 08/19/2017 1337   AST 25 06/22/2018 0817   AST 35 (H) 08/19/2017 1337   ALT 25 06/22/2018  0817   ALT 55 08/19/2017 1337   BILITOT 0.3 06/22/2018 0817   BILITOT 0.41 08/19/2017 1337       No results found for:  LABCA2  No components found for: [id number]  No results for input(s): INR in the last 168 hours.  Urinalysis    Component Value Date/Time   COLORURINE YELLOW 08/26/2011 1652   APPEARANCEUR CLEAR 08/26/2011 1652   LABSPEC 1.025 12/21/2013 1226   PHURINE 6.0 12/21/2013 1226   GLUCOSEU NEGATIVE 12/21/2013 1226   HGBUR TRACE (A) 12/21/2013 1226   BILIRUBINUR Negative 05/16/2018 1129   KETONESUR NEGATIVE 12/21/2013 1226   PROTEINUR Negative 05/16/2018 1129   PROTEINUR NEGATIVE 12/21/2013 1226   UROBILINOGEN 0.2 05/16/2018 1129   UROBILINOGEN 0.2 12/21/2013 1226   NITRITE Negative 05/16/2018 1129   NITRITE NEGATIVE 12/21/2013 1226   LEUKOCYTESUR Moderate (2+) (A) 05/16/2018 1129     STUDIES: She underwent a total hysterectomy on 06/29/2018. She is due for a Chest CT in December 2019.  ELIGIBLE FOR AVAILABLE RESEARCH PROTOCOL: no  ASSESSMENT: 42 y.o. [place] woman status post right breast upper outer quadrant biopsy 02/08/2017 for a clinical T2 N0, stage IB invasive ductal carcinoma, grade 2, estrogen and progesterone receptor positive, HER-2 nonamplified, with an MIB-1 of 5%.  (a) biopsy of upper outer quadrant calcifications in the left breast 02/09/2017 were benign  (1) Oncotype DX obtained from the biopsy sample showed a recurrence score of 11 predicting a 10 year risk of recurrence outside the breast of 9% if the patient's only systemic therapy is tamoxifen for 5 years (node positive report)  (2) bilateral mastectomies with right sentinel lymph node sampling 02/28/2017 showed  (a) on the left, intraductal papilloma, with no evidence of malignancy.  (b) on the right, a pT2 pN1 stage IIA invasive ductal carcinoma, grade 1, with close margins  (c) additional excision of the right nipple areolar area 03/15/2017 found no residual carcinoma  (d) status post bilateral definitive silicone implant placement 07/25/2018  (3) Mammaprint study on the final surgical sample came back low  risk, predicting a five-year distant disease-free survival of 97.8% with hormone therapy alone   (3) tamoxifen started neoadjuvantly 02/11/2017, Stopped 04/11/2017 in preparation for chemotherapy  (4) genetics counseling 02/19/2017 showed no deleterious mutations in the STAT gene panel offered by [facility] including ATM, BRCA1, BRCA2, CDH1, CHEK2, PALB2, PTEN, STK11, and TP53.  (a) multi gene panel February 21, 2017 through the  Multi-Gene Panel offered by [facility] found no deleterious mutations in ALK, APC, ATM, AXIN2,BAP1,  BARD1, BLM, BMPR1A, BRCA1, BRCA2, BRIP1, CASR, CDC73, CDH1, CDK4, CDKN1B, CDKN1C, CDKN2A (p14ARF), CDKN2A (p16INK4a), CEBPA, CHEK2, CTNNA1, DICER1, DIS3L2, EGFR (c.2369C>T, p.Thr790Met variant only), EPCAM (Deletion/duplication testing only), FH, FLCN, GATA2, GPC3, GREM1 (Promoter region deletion/duplication testing only), HOXB13 (c.251G>A, p.Gly84Glu), HRAS, KIT, MAX, MEN1, MET, MITF (c.952G>A, p.Glu318Lys variant only), MLH1, MSH2, MSH3, MSH6, MUTYH, NBN, NF1, NF2, NTHL1, PALB2, PDGFRA, PHOX2B, PMS2, POLD1, POLE, POT1, PRKAR1A, PTCH1, PTEN, RAD50, RAD51C, RAD51D, RB1, RECQL4, RET, RUNX1, SDHAF2, SDHA (sequence changes only), SDHB, SDHC, SDHD, SMAD4, SMARCA4, SMARCB1, SMARCE1, STK11, SUFU, TERT, TERT, TMEM127, TP53, TSC1, TSC2, VHL, WRN and WT1.   (5) adjuvant chemotherapy consisting of cyclophosphamide and docetaxel given every 21 days 4 started 04/15/2017, completed August 03, 2017 at [facility]  (6) adjuvant radiation completed 09/30/2017  (7) anastrozole started 10/25/2017  (a) ovarian suppression with monthly goserelin October 2018 through August 2019  (b) bone density on 11/25/2017 showed a T score of -2.0 osteopenic  (c) status  post hysterectomy and bilateral salpingo-oophorectomy 905 2019, with benign pathology  (d) denosumab/Prolia started 12/30/2017  (8) history of adrenal hyperplasia, 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  (9) significant hypercholesterolemia  (10) CT scan  for cardiac scoring 03/30/2018 showed clustered nodules in the inferior right upper lobe.  This was felt to be most likely inflammatory.  Repeat CT of the chest is pending.   PLAN: Katelyn Shelton is now a year and a half out from definitive surgery for her breast cancer.  There is no evidence of disease recurrence.  This is favorable.  She is tolerating anastrozole with difficulty.  She is very motivated, and is very concerned that breast cancer might recur.  I suggest that it is very safe for her to stop the drug now and consider starting exemestane in January.  She will call me back once she decides what to do.  She would like to have the right implant removed and a smaller implant placed, and she would be interested in a second opinion regarding that.  I have a call out to Dr. Abelino Derrick at [facility] regarding whether he thinks this might be possible.  Recall that she does have a history of chest reconstruction for pectum excavatum.  However I do not think that would make a difference to this particular question  She is having some menopausal symptoms as expected.  Katelyn Shelton is still frustrated.  She has not reached her "new normal" yet.  I think it will take another year or year and a half before we are there.  She will have a chest CT scan in a couple of weeks to make sure the changes noted on the scan in June have resolved.  She will see me again in March.  She knows to call for any other issues that may develop before the next visit here.   Frank Pilger, Virgie Dad, MD  09/19/18 12:02 PM Medical Oncology and Hematology [facility] [address] [place], [place] [address] Tel. [phone]    Fax. [phone]  I, Jacqualyn Posey am acting as a Education administrator for Chauncey Cruel, MD.   I, Lurline Del MD, have reviewed the above documentation for accuracy and completeness, and I agree with the above.

## 2018-09-18 ENCOUNTER — Other Ambulatory Visit: Payer: Self-pay | Admitting: *Deleted

## 2018-09-18 DIAGNOSIS — Z170 Estrogen receptor positive status [ER+]: Secondary | ICD-10-CM

## 2018-09-18 DIAGNOSIS — C50411 Malignant neoplasm of upper-outer quadrant of right female breast: Secondary | ICD-10-CM

## 2018-09-18 DIAGNOSIS — E7849 Other hyperlipidemia: Secondary | ICD-10-CM

## 2018-09-19 ENCOUNTER — Telehealth: Payer: Self-pay | Admitting: Oncology

## 2018-09-19 ENCOUNTER — Inpatient Hospital Stay: Payer: 59 | Attending: Oncology

## 2018-09-19 ENCOUNTER — Inpatient Hospital Stay (HOSPITAL_BASED_OUTPATIENT_CLINIC_OR_DEPARTMENT_OTHER): Payer: 59 | Admitting: Oncology

## 2018-09-19 VITALS — BP 109/65 | HR 74 | Temp 97.9°F | Resp 18 | Ht 60.0 in | Wt 134.9 lb

## 2018-09-19 DIAGNOSIS — Z9882 Breast implant status: Secondary | ICD-10-CM

## 2018-09-19 DIAGNOSIS — Z9221 Personal history of antineoplastic chemotherapy: Secondary | ICD-10-CM | POA: Diagnosis not present

## 2018-09-19 DIAGNOSIS — Z8614 Personal history of Methicillin resistant Staphylococcus aureus infection: Secondary | ICD-10-CM | POA: Diagnosis not present

## 2018-09-19 DIAGNOSIS — Z791 Long term (current) use of non-steroidal anti-inflammatories (NSAID): Secondary | ICD-10-CM | POA: Diagnosis not present

## 2018-09-19 DIAGNOSIS — Z803 Family history of malignant neoplasm of breast: Secondary | ICD-10-CM | POA: Insufficient documentation

## 2018-09-19 DIAGNOSIS — Z8042 Family history of malignant neoplasm of prostate: Secondary | ICD-10-CM

## 2018-09-19 DIAGNOSIS — E785 Hyperlipidemia, unspecified: Secondary | ICD-10-CM

## 2018-09-19 DIAGNOSIS — Z170 Estrogen receptor positive status [ER+]: Secondary | ICD-10-CM

## 2018-09-19 DIAGNOSIS — M8580 Other specified disorders of bone density and structure, unspecified site: Secondary | ICD-10-CM

## 2018-09-19 DIAGNOSIS — R232 Flushing: Secondary | ICD-10-CM | POA: Diagnosis not present

## 2018-09-19 DIAGNOSIS — Z9013 Acquired absence of bilateral breasts and nipples: Secondary | ICD-10-CM | POA: Diagnosis not present

## 2018-09-19 DIAGNOSIS — Z923 Personal history of irradiation: Secondary | ICD-10-CM | POA: Diagnosis not present

## 2018-09-19 DIAGNOSIS — Z79811 Long term (current) use of aromatase inhibitors: Secondary | ICD-10-CM | POA: Insufficient documentation

## 2018-09-19 DIAGNOSIS — C50411 Malignant neoplasm of upper-outer quadrant of right female breast: Secondary | ICD-10-CM

## 2018-09-19 DIAGNOSIS — Z79899 Other long term (current) drug therapy: Secondary | ICD-10-CM | POA: Diagnosis not present

## 2018-09-19 DIAGNOSIS — M2550 Pain in unspecified joint: Secondary | ICD-10-CM | POA: Insufficient documentation

## 2018-09-19 DIAGNOSIS — Z90710 Acquired absence of both cervix and uterus: Secondary | ICD-10-CM | POA: Insufficient documentation

## 2018-09-19 DIAGNOSIS — E278 Other specified disorders of adrenal gland: Secondary | ICD-10-CM | POA: Insufficient documentation

## 2018-09-19 DIAGNOSIS — E7849 Other hyperlipidemia: Secondary | ICD-10-CM

## 2018-09-19 LAB — CBC WITH DIFFERENTIAL (CANCER CENTER ONLY)
Abs Immature Granulocytes: 0.02 10*3/uL (ref 0.00–0.07)
BASOS ABS: 0 10*3/uL (ref 0.0–0.1)
Basophils Relative: 1 %
EOS ABS: 0.1 10*3/uL (ref 0.0–0.5)
EOS PCT: 2 %
HCT: 41.7 % (ref 36.0–46.0)
Hemoglobin: 13.9 g/dL (ref 12.0–15.0)
Immature Granulocytes: 1 %
Lymphocytes Relative: 26 %
Lymphs Abs: 1.2 10*3/uL (ref 0.7–4.0)
MCH: 31.6 pg (ref 26.0–34.0)
MCHC: 33.3 g/dL (ref 30.0–36.0)
MCV: 94.8 fL (ref 80.0–100.0)
Monocytes Absolute: 0.5 10*3/uL (ref 0.1–1.0)
Monocytes Relative: 10 %
NRBC: 0 % (ref 0.0–0.2)
Neutro Abs: 2.7 10*3/uL (ref 1.7–7.7)
Neutrophils Relative %: 60 %
Platelet Count: 240 10*3/uL (ref 150–400)
RBC: 4.4 MIL/uL (ref 3.87–5.11)
RDW: 12.1 % (ref 11.5–15.5)
WBC: 4.4 10*3/uL (ref 4.0–10.5)

## 2018-09-19 LAB — CMP (CANCER CENTER ONLY)
ALK PHOS: 47 U/L (ref 38–126)
ALT: 22 U/L (ref 0–44)
AST: 26 U/L (ref 15–41)
Albumin: 4.3 g/dL (ref 3.5–5.0)
Anion gap: 10 (ref 5–15)
BILIRUBIN TOTAL: 0.5 mg/dL (ref 0.3–1.2)
BUN: 17 mg/dL (ref 6–20)
CALCIUM: 9.7 mg/dL (ref 8.9–10.3)
CO2: 26 mmol/L (ref 22–32)
Chloride: 108 mmol/L (ref 98–111)
Creatinine: 0.74 mg/dL (ref 0.44–1.00)
Glucose, Bld: 82 mg/dL (ref 70–99)
Potassium: 3.8 mmol/L (ref 3.5–5.1)
Sodium: 144 mmol/L (ref 135–145)
TOTAL PROTEIN: 7.6 g/dL (ref 6.5–8.1)

## 2018-09-19 NOTE — Telephone Encounter (Signed)
Per 11/26 patient already scheduled

## 2018-10-10 ENCOUNTER — Ambulatory Visit (INDEPENDENT_AMBULATORY_CARE_PROVIDER_SITE_OTHER)
Admission: RE | Admit: 2018-10-10 | Discharge: 2018-10-10 | Disposition: A | Discharge status: Home / Self Care | Payer: 59 | Source: Ambulatory Visit | Attending: Cardiology | Admitting: Cardiology

## 2018-10-10 DIAGNOSIS — C50911 Malignant neoplasm of unspecified site of right female breast: Secondary | ICD-10-CM | POA: Diagnosis not present

## 2018-10-10 DIAGNOSIS — C50411 Malignant neoplasm of upper-outer quadrant of right female breast: Secondary | ICD-10-CM

## 2018-10-10 DIAGNOSIS — R911 Solitary pulmonary nodule: Secondary | ICD-10-CM

## 2018-10-10 DIAGNOSIS — Z170 Estrogen receptor positive status [ER+]: Principal | ICD-10-CM

## 2018-10-10 DIAGNOSIS — R918 Other nonspecific abnormal finding of lung field: Secondary | ICD-10-CM

## 2018-10-10 DIAGNOSIS — Z859 Personal history of malignant neoplasm, unspecified: Secondary | ICD-10-CM

## 2018-10-10 MED FILL — REPATHA 140 MG/1 ML INJ: 140 | 28 days supply | Qty: 2 | Fill #5

## 2018-10-23 MED FILL — VENLAFAXINE HCL ER 75 MG CA: 75 | 90 days supply | Qty: 90 | Fill #2

## 2018-11-01 ENCOUNTER — Ambulatory Visit (INDEPENDENT_AMBULATORY_CARE_PROVIDER_SITE_OTHER): Payer: 59 | Admitting: Licensed Clinical Social Worker

## 2018-11-01 DIAGNOSIS — F324 Major depressive disorder, single episode, in partial remission: Secondary | ICD-10-CM

## 2018-11-06 MED FILL — REPATHA SURECLICK 140 MG/ML: 140 | 28 days supply | Qty: 2 | Fill #6

## 2018-11-20 ENCOUNTER — Telehealth: Payer: Self-pay | Admitting: Family Medicine

## 2018-11-20 NOTE — Telephone Encounter (Signed)
See note  Copied from [place] [phone]. Topic: General - Other >> Nov 20, 2018 11:21 AM Percell Belt A wrote: Reason for CRM:  Pt called in and has some question about pelvic pain.  She is having painful intercourse.  She is requesting a call back from Dr Tour manager only.  She has some questions about the Estradiol that she had been taking.  She is wanting to be referred to oncology in [place] but does not know the name. She will try to get this info before nurse calls her back .

## 2018-11-22 ENCOUNTER — Other Ambulatory Visit: Payer: Self-pay

## 2018-11-22 DIAGNOSIS — Z170 Estrogen receptor positive status [ER+]: Principal | ICD-10-CM

## 2018-11-22 DIAGNOSIS — C50411 Malignant neoplasm of upper-outer quadrant of right female breast: Secondary | ICD-10-CM

## 2018-11-22 NOTE — Telephone Encounter (Signed)
Called and spoke with patient and she would like a referral to Dr. Janan Halter at [place]. She had a hysterectomy in September. She is having painful intercourse. She would like guidance on her medications and would prefer a woman provider and wants a womans opinions. She also wonders if she need to see another Pelvic Health Specialist maybe at [facility].   She is in the [facility] health system as she has seen Dr. Mauro Kaufmann Muss previously. Dr. Louretta Shorten here in [place] performed her hysterectomy but everything else has been done in [facility].  Please advise

## 2018-11-22 NOTE — Telephone Encounter (Signed)
Referral has been placed to Dr. Janan Halter at [place], [facility]

## 2018-11-22 NOTE — Telephone Encounter (Signed)
I am fine with you placing a referral to provider of her preference

## 2018-11-24 ENCOUNTER — Other Ambulatory Visit: Payer: Self-pay

## 2018-11-24 ENCOUNTER — Encounter (HOSPITAL_BASED_OUTPATIENT_CLINIC_OR_DEPARTMENT_OTHER): Payer: Self-pay | Admitting: *Deleted

## 2018-11-28 ENCOUNTER — Telehealth: Payer: Self-pay | Admitting: Family Medicine

## 2018-11-28 ENCOUNTER — Ambulatory Visit (INDEPENDENT_AMBULATORY_CARE_PROVIDER_SITE_OTHER): Payer: 59 | Admitting: Licensed Clinical Social Worker

## 2018-11-28 DIAGNOSIS — F324 Major depressive disorder, single episode, in partial remission: Secondary | ICD-10-CM

## 2018-11-28 MED ORDER — OSELTAMIVIR PHOSPHATE 75 MG PO CAPS: 75.0000 mg | ORAL_CAPSULE | Freq: Every day | ORAL | 0 refills | Status: DC

## 2018-11-28 NOTE — Telephone Encounter (Signed)
Child diagnosed with flu-sending in Tamiflu prophylaxis.

## 2018-11-28 NOTE — Telephone Encounter (Signed)
See note  Copied from [place] [id number]. Topic: General - Other >> Nov 28, 2018  4:31 PM Wynetta Emery, [place] C wrote: Reason for CRM: pt says that she would like to have a precaution Rx for Tamiflu. Pt says that her son was Dx.   Pharmacy: [place], [place] - [address] [phone] (Phone) [phone] (Fax)  Please advise.

## 2018-11-28 NOTE — Telephone Encounter (Signed)
See separate phone note.

## 2018-11-29 MED FILL — OSELTAMIVIR PHOS 75 MG CAP: 75 | 10 days supply | Qty: 10 | Fill #0

## 2018-11-29 NOTE — Progress Notes (Signed)
Ensure pre surgery drink given with instructions to complete by 0415 dos, surgical soap given with instructions, pt verbalized understanding. 

## 2018-11-30 ENCOUNTER — Telehealth: Payer: Self-pay | Admitting: Family Medicine

## 2018-11-30 NOTE — Telephone Encounter (Signed)
Copied from [place] [phone]. Topic: Referral - Question >> Nov 27, 2018  3:10 PM Vernona Rieger wrote: Reason for CRM: Ileana with [facility] said they received a referral for her on 1/30 and would like to know why? She said the patient was there last year, she would like a call back @ [phone]

## 2018-12-01 ENCOUNTER — Encounter (HOSPITAL_BASED_OUTPATIENT_CLINIC_OR_DEPARTMENT_OTHER)
Admission: RE | Disposition: A | Discharge status: Home / Self Care | Payer: Self-pay | Source: Home / Self Care | Attending: Plastic Surgery

## 2018-12-01 ENCOUNTER — Ambulatory Visit (HOSPITAL_BASED_OUTPATIENT_CLINIC_OR_DEPARTMENT_OTHER)
Admission: RE | Admit: 2018-12-01 | Discharge: 2018-12-01 | Disposition: A | Discharge status: Home / Self Care | Payer: 59 | Attending: Plastic Surgery | Admitting: Plastic Surgery

## 2018-12-01 ENCOUNTER — Ambulatory Visit (HOSPITAL_BASED_OUTPATIENT_CLINIC_OR_DEPARTMENT_OTHER): Payer: 59 | Admitting: Anesthesiology

## 2018-12-01 ENCOUNTER — Other Ambulatory Visit: Payer: Self-pay

## 2018-12-01 DIAGNOSIS — Z7983 Long term (current) use of bisphosphonates: Secondary | ICD-10-CM | POA: Diagnosis not present

## 2018-12-01 DIAGNOSIS — Z882 Allergy status to sulfonamides status: Secondary | ICD-10-CM | POA: Diagnosis not present

## 2018-12-01 DIAGNOSIS — Z880 Allergy status to penicillin: Secondary | ICD-10-CM | POA: Insufficient documentation

## 2018-12-01 DIAGNOSIS — Z421 Encounter for breast reconstruction following mastectomy: Secondary | ICD-10-CM | POA: Insufficient documentation

## 2018-12-01 DIAGNOSIS — Z853 Personal history of malignant neoplasm of breast: Secondary | ICD-10-CM | POA: Diagnosis not present

## 2018-12-01 DIAGNOSIS — F419 Anxiety disorder, unspecified: Secondary | ICD-10-CM | POA: Diagnosis not present

## 2018-12-01 DIAGNOSIS — Z79811 Long term (current) use of aromatase inhibitors: Secondary | ICD-10-CM | POA: Insufficient documentation

## 2018-12-01 DIAGNOSIS — Z79899 Other long term (current) drug therapy: Secondary | ICD-10-CM | POA: Insufficient documentation

## 2018-12-01 DIAGNOSIS — Z9013 Acquired absence of bilateral breasts and nipples: Secondary | ICD-10-CM | POA: Diagnosis not present

## 2018-12-01 DIAGNOSIS — Z885 Allergy status to narcotic agent status: Secondary | ICD-10-CM | POA: Diagnosis not present

## 2018-12-01 DIAGNOSIS — Z923 Personal history of irradiation: Secondary | ICD-10-CM | POA: Diagnosis not present

## 2018-12-01 DIAGNOSIS — L308 Other specified dermatitis: Secondary | ICD-10-CM | POA: Diagnosis not present

## 2018-12-01 HISTORY — PX: LIPOSUCTION WITH LIPOFILLING: SHX6436

## 2018-12-01 HISTORY — PX: AREOLA/NIPPLE RECONSTRUCTION WITH GRAFT: SHX5586

## 2018-12-01 HISTORY — PX: BREAST RECONSTRUCTION: SHX9

## 2018-12-01 SURGERY — LIPOSUCTION, WITH FAT TRANSFER
Anesthesia: General | Site: Chest | Laterality: Right

## 2018-12-01 MED ORDER — ONDANSETRON HCL 4 MG/2ML IJ SOLN
INTRAMUSCULAR | Status: AC
  Filled 2018-12-01: qty 2

## 2018-12-01 MED ORDER — ROCURONIUM BROMIDE 50 MG/5ML IV SOSY
PREFILLED_SYRINGE | INTRAVENOUS | Status: AC
  Filled 2018-12-01: qty 5

## 2018-12-01 MED ORDER — ACETAMINOPHEN 500 MG PO TABS
ORAL_TABLET | ORAL | Status: AC
  Filled 2018-12-01: qty 2

## 2018-12-01 MED ORDER — PROPOFOL 10 MG/ML IV BOLUS
INTRAVENOUS | Status: DC | PRN
  Administered 2018-12-01: 150 mg via INTRAVENOUS

## 2018-12-01 MED ORDER — MIDAZOLAM HCL 2 MG/2ML IJ SOLN
INTRAMUSCULAR | Status: DC | PRN
  Administered 2018-12-01: 2 mg via INTRAVENOUS

## 2018-12-01 MED ORDER — PROPOFOL 10 MG/ML IV BOLUS
INTRAVENOUS | Status: AC
  Filled 2018-12-01: qty 40

## 2018-12-01 MED ORDER — HYDROMORPHONE HCL 1 MG/ML IJ SOLN
INTRAMUSCULAR | Status: AC
  Filled 2018-12-01: qty 0.5

## 2018-12-01 MED ORDER — HYDROMORPHONE HCL 1 MG/ML IJ SOLN
INTRAMUSCULAR | Status: AC
  Filled 2018-12-01: qty 2.5

## 2018-12-01 MED ORDER — HYDROMORPHONE HCL 1 MG/ML IJ SOLN
0.2500 mg | INTRAMUSCULAR | Status: DC | PRN
  Administered 2018-12-01: 0.5 mg via INTRAVENOUS

## 2018-12-01 MED ORDER — OXYCODONE HCL 5 MG PO TABS: 5.0000 mg | ORAL_TABLET | ORAL | 0 refills | Status: DC | PRN

## 2018-12-01 MED ORDER — HYDROMORPHONE HCL 1 MG/ML IJ SOLN
INTRAMUSCULAR | Status: DC | PRN
  Administered 2018-12-01: 0.5 mg via INTRAVENOUS
  Administered 2018-12-01: .25 mg via INTRAVENOUS

## 2018-12-01 MED ORDER — OXYCODONE HCL 5 MG/5ML PO SOLN: 5.0000 mg | Freq: Once | ORAL | Status: DC | PRN

## 2018-12-01 MED ORDER — LIDOCAINE HCL (PF) 1 % IJ SOLN
INTRAMUSCULAR | Status: AC
  Filled 2018-12-01: qty 60

## 2018-12-01 MED ORDER — LIDOCAINE-EPINEPHRINE 1 %-1:100000 IJ SOLN
INTRAMUSCULAR | Status: AC
  Filled 2018-12-01: qty 1

## 2018-12-01 MED ORDER — GABAPENTIN 300 MG PO CAPS
300.0000 mg | ORAL_CAPSULE | ORAL | Status: AC
  Administered 2018-12-01: 300 mg via ORAL

## 2018-12-01 MED ORDER — ONDANSETRON HCL 4 MG/2ML IJ SOLN
INTRAMUSCULAR | Status: DC | PRN
  Administered 2018-12-01: 4 mg via INTRAVENOUS

## 2018-12-01 MED ORDER — GABAPENTIN 300 MG PO CAPS
ORAL_CAPSULE | ORAL | Status: AC
  Filled 2018-12-01: qty 1

## 2018-12-01 MED ORDER — DEXAMETHASONE SODIUM PHOSPHATE 10 MG/ML IJ SOLN
INTRAMUSCULAR | Status: AC
  Filled 2018-12-01: qty 1

## 2018-12-01 MED ORDER — ROCURONIUM BROMIDE 100 MG/10ML IV SOLN
INTRAVENOUS | Status: DC | PRN
  Administered 2018-12-01: 20 mg via INTRAVENOUS
  Administered 2018-12-01: 50 mg via INTRAVENOUS
  Administered 2018-12-01: 30 mg via INTRAVENOUS

## 2018-12-01 MED ORDER — CELECOXIB 200 MG PO CAPS
ORAL_CAPSULE | ORAL | Status: AC
  Filled 2018-12-01: qty 1

## 2018-12-01 MED ORDER — MIDAZOLAM HCL 2 MG/2ML IJ SOLN: 1.0000 mg | INTRAMUSCULAR | Status: DC | PRN

## 2018-12-01 MED ORDER — SCOPOLAMINE 1 MG/3DAYS TD PT72
MEDICATED_PATCH | TRANSDERMAL | Status: AC
  Filled 2018-12-01: qty 1

## 2018-12-01 MED ORDER — CHLORHEXIDINE GLUCONATE CLOTH 2 % EX PADS: 6.0000 | MEDICATED_PAD | Freq: Once | CUTANEOUS | Status: DC

## 2018-12-01 MED ORDER — BUPIVACAINE HCL (PF) 0.25 % IJ SOLN
INTRAMUSCULAR | Status: AC
  Filled 2018-12-01: qty 30

## 2018-12-01 MED ORDER — ONDANSETRON HCL 4 MG/2ML IJ SOLN: 4.0000 mg | Freq: Four times a day (QID) | INTRAMUSCULAR | Status: DC | PRN

## 2018-12-01 MED ORDER — DIPHENHYDRAMINE HCL 50 MG/ML IJ SOLN
12.5000 mg | Freq: Four times a day (QID) | INTRAMUSCULAR | Status: DC | PRN
  Administered 2018-12-01: 12.5 mg via INTRAVENOUS

## 2018-12-01 MED ORDER — BACITRACIN ZINC 500 UNIT/GM EX OINT
TOPICAL_OINTMENT | CUTANEOUS | Status: AC
  Filled 2018-12-01: qty 0.9

## 2018-12-01 MED ORDER — DEXAMETHASONE SODIUM PHOSPHATE 4 MG/ML IJ SOLN
INTRAMUSCULAR | Status: DC | PRN
  Administered 2018-12-01: 10 mg via INTRAVENOUS

## 2018-12-01 MED ORDER — DIPHENHYDRAMINE HCL 50 MG/ML IJ SOLN
INTRAMUSCULAR | Status: AC
  Filled 2018-12-01: qty 1

## 2018-12-01 MED ORDER — CIPROFLOXACIN IN D5W 400 MG/200ML IV SOLN
INTRAVENOUS | Status: AC
  Filled 2018-12-01: qty 200

## 2018-12-01 MED ORDER — CIPROFLOXACIN IN D5W 400 MG/200ML IV SOLN
400.0000 mg | INTRAVENOUS | Status: AC
  Administered 2018-12-01: 400 mg via INTRAVENOUS

## 2018-12-01 MED ORDER — EPINEPHRINE 30 MG/30ML IJ SOLN
INTRAMUSCULAR | Status: AC
  Filled 2018-12-01: qty 1

## 2018-12-01 MED ORDER — SCOPOLAMINE 1 MG/3DAYS TD PT72: 1.0000 | MEDICATED_PATCH | Freq: Once | TRANSDERMAL | Status: DC | PRN

## 2018-12-01 MED ORDER — SODIUM BICARBONATE 4.2 % IV SOLN
INTRAVENOUS | Status: AC
  Filled 2018-12-01: qty 20

## 2018-12-01 MED ORDER — SUGAMMADEX SODIUM 200 MG/2ML IV SOLN
INTRAVENOUS | Status: AC
  Filled 2018-12-01: qty 2

## 2018-12-01 MED ORDER — LIDOCAINE 2% (20 MG/ML) 5 ML SYRINGE
INTRAMUSCULAR | Status: AC
  Filled 2018-12-01: qty 5

## 2018-12-01 MED ORDER — MIDAZOLAM HCL 2 MG/2ML IJ SOLN
INTRAMUSCULAR | Status: AC
  Filled 2018-12-01: qty 2

## 2018-12-01 MED ORDER — LACTATED RINGERS IV SOLN
INTRAVENOUS | Status: DC
  Administered 2018-12-01 (×2): via INTRAVENOUS

## 2018-12-01 MED ORDER — CELECOXIB 200 MG PO CAPS
200.0000 mg | ORAL_CAPSULE | ORAL | Status: AC
  Administered 2018-12-01: 200 mg via ORAL

## 2018-12-01 MED ORDER — SCOPOLAMINE 1 MG/3DAYS TD PT72
MEDICATED_PATCH | TRANSDERMAL | Status: DC | PRN
  Administered 2018-12-01: 1 via TRANSDERMAL

## 2018-12-01 MED ORDER — ACETAMINOPHEN 500 MG PO TABS
1000.0000 mg | ORAL_TABLET | ORAL | Status: AC
  Administered 2018-12-01: 1000 mg via ORAL

## 2018-12-01 MED ORDER — SUGAMMADEX SODIUM 200 MG/2ML IV SOLN
INTRAVENOUS | Status: DC | PRN
  Administered 2018-12-01: 244.4 mg via INTRAVENOUS

## 2018-12-01 MED ORDER — SODIUM BICARBONATE 4 % IV SOLN
INTRAVENOUS | Status: DC | PRN
  Administered 2018-12-01: 200 mL via INTRAMUSCULAR

## 2018-12-01 MED ORDER — LIDOCAINE-EPINEPHRINE 1 %-1:100000 IJ SOLN
INTRAMUSCULAR | Status: DC | PRN
  Administered 2018-12-01: 10 mL

## 2018-12-01 MED ORDER — OXYCODONE HCL 5 MG PO TABS: 5.0000 mg | ORAL_TABLET | Freq: Once | ORAL | Status: DC | PRN

## 2018-12-01 MED ORDER — LIDOCAINE HCL (CARDIAC) PF 100 MG/5ML IV SOSY
PREFILLED_SYRINGE | INTRAVENOUS | Status: DC | PRN
  Administered 2018-12-01: 50 mg via INTRAVENOUS

## 2018-12-01 MED FILL — oxyCODONE HCL 5 MG TABS: 5 | 3 days supply | Qty: 20 | Fill #0

## 2018-12-01 SURGICAL SUPPLY — 122 items
BAG DECANTER FOR FLEXI CONT (MISCELLANEOUS) ×4 IMPLANT
BENZOIN TINCTURE PRP APPL 2/3 (GAUZE/BANDAGES/DRESSINGS) IMPLANT
BINDER ABDOMINAL 10 UNV 27-48 (MISCELLANEOUS) ×2 IMPLANT
BINDER ABDOMINAL 12 SM 30-45 (SOFTGOODS) IMPLANT
BINDER BREAST LRG (GAUZE/BANDAGES/DRESSINGS) IMPLANT
BINDER BREAST MEDIUM (GAUZE/BANDAGES/DRESSINGS) IMPLANT
BINDER BREAST XLRG (GAUZE/BANDAGES/DRESSINGS) ×2 IMPLANT
BINDER BREAST XXLRG (GAUZE/BANDAGES/DRESSINGS) IMPLANT
BLADE CLIPPER SURG (BLADE) ×2 IMPLANT
BLADE SURG 10 STRL SS (BLADE) ×22 IMPLANT
BLADE SURG 11 STRL SS (BLADE) ×6 IMPLANT
BLADE SURG 15 STRL LF DISP TIS (BLADE) ×4 IMPLANT
BLADE SURG 15 STRL SS (BLADE) ×2
BNDG GAUZE ELAST 4 BULKY (GAUZE/BANDAGES/DRESSINGS) ×12 IMPLANT
BRUSH SCRUB EZ PLAIN DRY (MISCELLANEOUS) ×6 IMPLANT
CANISTER LIPO FAT HARVEST (MISCELLANEOUS) IMPLANT
CANISTER SUCT 1200ML W/VALVE (MISCELLANEOUS) ×6 IMPLANT
CHLORAPREP W/TINT 26ML (MISCELLANEOUS) ×14 IMPLANT
CLOSURE WOUND 1/2 X4 (GAUZE/BANDAGES/DRESSINGS)
CLOSURE WOUND 1/4X4 (GAUZE/BANDAGES/DRESSINGS)
COTTONBALL LRG STERILE PKG (GAUZE/BANDAGES/DRESSINGS) IMPLANT
COVER BACK TABLE 60X90IN (DRAPES) ×6 IMPLANT
COVER MAYO STAND STRL (DRAPES) ×12 IMPLANT
COVER WAND RF STERILE (DRAPES) IMPLANT
DECANTER SPIKE VIAL GLASS SM (MISCELLANEOUS) IMPLANT
DERMABOND ADVANCED (GAUZE/BANDAGES/DRESSINGS) ×6
DERMABOND ADVANCED .7 DNX12 (GAUZE/BANDAGES/DRESSINGS) ×8 IMPLANT
DRAIN CHANNEL 15F RND FF W/TCR (WOUND CARE) ×4 IMPLANT
DRAPE TOP ARMCOVERS (MISCELLANEOUS) ×6 IMPLANT
DRAPE U-SHAPE 76X120 STRL (DRAPES) ×6 IMPLANT
DRAPE UTILITY XL STRL (DRAPES) ×2 IMPLANT
DRSG EMULSION OIL 3X3 NADH (GAUZE/BANDAGES/DRESSINGS) ×6 IMPLANT
DRSG PAD ABDOMINAL 8X10 ST (GAUZE/BANDAGES/DRESSINGS) ×16 IMPLANT
DRSG TEGADERM 4X10 (GAUZE/BANDAGES/DRESSINGS) IMPLANT
DRSG TEGADERM 4X4.75 (GAUZE/BANDAGES/DRESSINGS) IMPLANT
DRSG TELFA 3X8 NADH (GAUZE/BANDAGES/DRESSINGS) IMPLANT
ELECT BLADE 4.0 EZ CLEAN MEGAD (MISCELLANEOUS)
ELECT COATED BLADE 2.86 ST (ELECTRODE) ×6 IMPLANT
ELECT NDL BLADE 2-5/6 (NEEDLE) ×4 IMPLANT
ELECT NEEDLE BLADE 2-5/6 (NEEDLE) IMPLANT
ELECT REM PT RETURN 9FT ADLT (ELECTROSURGICAL) ×6
ELECTRODE BLDE 4.0 EZ CLN MEGD (MISCELLANEOUS) ×4 IMPLANT
ELECTRODE REM PT RTRN 9FT ADLT (ELECTROSURGICAL) ×4 IMPLANT
EVACUATOR SILICONE 100CC (DRAIN) ×4 IMPLANT
EXTRACTOR CANIST REVOLVE STRL (CANNISTER) ×2 IMPLANT
GAUZE SPONGE 4X4 12PLY STRL LF (GAUZE/BANDAGES/DRESSINGS) IMPLANT
GAUZE XEROFORM 1X8 LF (GAUZE/BANDAGES/DRESSINGS) IMPLANT
GAUZE XEROFORM 5X9 LF (GAUZE/BANDAGES/DRESSINGS) IMPLANT
GLOVE BIO SURGEON STRL SZ 6 (GLOVE) ×4 IMPLANT
GLOVE BIOGEL PI IND STRL 6.5 (GLOVE) IMPLANT
GLOVE BIOGEL PI IND STRL 7.0 (GLOVE) IMPLANT
GLOVE BIOGEL PI IND STRL 7.5 (GLOVE) IMPLANT
GLOVE BIOGEL PI INDICATOR 6.5 (GLOVE) ×4
GLOVE BIOGEL PI INDICATOR 7.0 (GLOVE) ×2
GLOVE BIOGEL PI INDICATOR 7.5 (GLOVE) ×2
GLOVE SURG SS PI 6.0 STRL IVOR (GLOVE) ×4 IMPLANT
GLOVE SURG SS PI 6.5 STRL IVOR (GLOVE) ×2 IMPLANT
GLOVE SURG SS PI 7.0 STRL IVOR (GLOVE) ×2 IMPLANT
GOWN STRL REUS W/ TWL LRG LVL3 (GOWN DISPOSABLE) ×8 IMPLANT
GOWN STRL REUS W/ TWL XL LVL3 (GOWN DISPOSABLE) IMPLANT
GOWN STRL REUS W/TWL LRG LVL3 (GOWN DISPOSABLE) ×4
GOWN STRL REUS W/TWL XL LVL3 (GOWN DISPOSABLE) ×2
IV NS 500ML (IV SOLUTION)
IV NS 500ML BAXH (IV SOLUTION) ×4 IMPLANT
KIT FILL SYSTEM UNIVERSAL (SET/KITS/TRAYS/PACK) IMPLANT
LINER CANISTER 1000CC FLEX (MISCELLANEOUS) ×8 IMPLANT
MARKER SKIN DUAL TIP RULER LAB (MISCELLANEOUS) IMPLANT
NDL HYPO 25X1 1.5 SAFETY (NEEDLE) IMPLANT
NDL PRECISIONGLIDE 27X1.5 (NEEDLE) IMPLANT
NDL SAFETY ECLIPSE 18X1.5 (NEEDLE) ×4 IMPLANT
NEEDLE HYPO 18GX1.5 SHARP (NEEDLE) ×4
NEEDLE HYPO 25X1 1.5 SAFETY (NEEDLE) ×6 IMPLANT
NEEDLE PRECISIONGLIDE 27X1.5 (NEEDLE) IMPLANT
NS IRRIG 1000ML POUR BTL (IV SOLUTION) ×2 IMPLANT
PACK BASIN DAY SURGERY FS (CUSTOM PROCEDURE TRAY) ×6 IMPLANT
PAD ALCOHOL SWAB (MISCELLANEOUS) ×6 IMPLANT
PAD DRESSING TELFA 3X8 NADH (GAUZE/BANDAGES/DRESSINGS) IMPLANT
PENCIL BUTTON HOLSTER BLD 10FT (ELECTRODE) ×6 IMPLANT
PIN SAFETY STERILE (MISCELLANEOUS) ×4 IMPLANT
PUNCH BIOPSY DERMAL 4MM (MISCELLANEOUS) IMPLANT
SHEET MEDIUM DRAPE 40X70 STRL (DRAPES) ×10 IMPLANT
SLEEVE SCD COMPRESS KNEE MED (MISCELLANEOUS) ×6 IMPLANT
SPONGE GAUZE 2X2 8PLY STER LF (GAUZE/BANDAGES/DRESSINGS)
SPONGE GAUZE 2X2 8PLY STRL LF (GAUZE/BANDAGES/DRESSINGS) IMPLANT
SPONGE LAP 18X18 RF (DISPOSABLE) ×12 IMPLANT
STAPLER VISISTAT 35W (STAPLE) ×6 IMPLANT
STRIP CLOSURE SKIN 1/2X4 (GAUZE/BANDAGES/DRESSINGS) IMPLANT
STRIP CLOSURE SKIN 1/4X4 (GAUZE/BANDAGES/DRESSINGS) IMPLANT
SUT CHROMIC 5 0 P 3 (SUTURE) ×4 IMPLANT
SUT ETHILON 2 0 FS 18 (SUTURE) ×4 IMPLANT
SUT ETHILON 4 0 PS 2 18 (SUTURE) IMPLANT
SUT MNCRL AB 4-0 PS2 18 (SUTURE) ×8 IMPLANT
SUT MON AB 5-0 P3 18 (SUTURE) IMPLANT
SUT PDS 3-0 CT2 (SUTURE)
SUT PDS AB 2-0 CT2 27 (SUTURE) IMPLANT
SUT PDS II 3-0 CT2 27 ABS (SUTURE) IMPLANT
SUT PLAIN 5 0 P 3 18 (SUTURE) ×2 IMPLANT
SUT PROLENE 2 0 CT2 30 (SUTURE) IMPLANT
SUT PROLENE 5 0 P 3 (SUTURE) IMPLANT
SUT PROLENE 5 0 PS 2 (SUTURE) IMPLANT
SUT PROLENE 6 0 P 1 18 (SUTURE) IMPLANT
SUT SILK 4 0 PS 2 (SUTURE) ×4 IMPLANT
SUT VIC AB 3-0 PS1 18 (SUTURE) ×2
SUT VIC AB 3-0 PS1 18XBRD (SUTURE) IMPLANT
SUT VIC AB 3-0 SH 27 (SUTURE)
SUT VIC AB 3-0 SH 27X BRD (SUTURE) IMPLANT
SUT VICRYL 4-0 PS2 18IN ABS (SUTURE) ×6 IMPLANT
SYR 10ML LL (SYRINGE) ×22 IMPLANT
SYR 50ML LL SCALE MARK (SYRINGE) ×4 IMPLANT
SYR BULB 3OZ (MISCELLANEOUS) IMPLANT
SYR BULB IRRIGATION 50ML (SYRINGE) ×6 IMPLANT
SYR CONTROL 10ML LL (SYRINGE) ×6 IMPLANT
SYR TB 1ML LL NO SAFETY (SYRINGE) ×6 IMPLANT
SYRINGE TOOMEY DISP (SYRINGE) ×2 IMPLANT
TOWEL GREEN STERILE FF (TOWEL DISPOSABLE) ×12 IMPLANT
TRAY DSU PREP LF (CUSTOM PROCEDURE TRAY) IMPLANT
TUBE CONNECTING 20'X1/4 (TUBING) ×2
TUBE CONNECTING 20X1/4 (TUBING) ×6 IMPLANT
TUBING INFILTRATION IT-10001 (TUBING) ×6 IMPLANT
TUBING SET GRADUATE ASPIR 12FT (MISCELLANEOUS) ×6 IMPLANT
UNDERPAD 30X30 (UNDERPADS AND DIAPERS) ×12 IMPLANT
YANKAUER SUCT BULB TIP NO VENT (SUCTIONS) ×6 IMPLANT

## 2018-12-01 NOTE — Discharge Instructions (Signed)
°  Post Anesthesia Home Care Instructions  No tylenol until after 1:00pm 12/01/2018.  Activity: Get plenty of rest for the remainder of the day. A responsible individual must stay with you for 24 hours following the procedure.  For the next 24 hours, DO NOT: -Drive a car -Paediatric nurse -Drink alcoholic beverages -Take any medication unless instructed by your physician -Make any legal decisions or sign important papers.  Meals: Start with liquid foods such as gelatin or soup. Progress to regular foods as tolerated. Avoid greasy, spicy, heavy foods. If nausea and/or vomiting occur, drink only clear liquids until the nausea and/or vomiting subsides. Call your physician if vomiting continues.  Special Instructions/Symptoms: Your throat may feel dry or sore from the anesthesia or the breathing tube placed in your throat during surgery. If this causes discomfort, gargle with warm salt water. The discomfort should disappear within 24 hours.  If you had a scopolamine patch placed behind your ear for the management of post- operative nausea and/or vomiting:  1. The medication in the patch is effective for 72 hours, after which it should be removed.  Wrap patch in a tissue and discard in the trash. Wash hands thoroughly with soap and water. 2. You may remove the patch earlier than 72 hours if you experience unpleasant side effects which may include dry mouth, dizziness or visual disturbances. 3. Avoid touching the patch. Wash your hands with soap and water after contact with the patch.

## 2018-12-01 NOTE — Anesthesia Procedure Notes (Signed)
Procedure Name: Intubation Date/Time: 12/01/2018 7:39 AM Performed by: Rayvon Char, CRNA Pre-anesthesia Checklist: Patient identified, Emergency Drugs available, Suction available and Patient being monitored Patient Re-evaluated:Patient Re-evaluated prior to induction Oxygen Delivery Method: Circle system utilized Preoxygenation: Pre-oxygenation with 100% oxygen Induction Type: IV induction Ventilation: Mask ventilation without difficulty Laryngoscope Size: Miller and 2 Grade View: Grade I Tube size: 7.0 mm Number of attempts: 1 Airway Equipment and Method: Stylet Secured at: 22 cm Tube secured with: Tape Dental Injury: Teeth and Oropharynx as per pre-operative assessment

## 2018-12-01 NOTE — Anesthesia Preprocedure Evaluation (Signed)
Anesthesia Evaluation  Patient identified by MRN, date of birth, ID band Patient awake    Reviewed: Allergy & Precautions, H&P , NPO status , Patient's Chart, lab work & pertinent test results  Airway Mallampati: II   Neck ROM: full    Dental   Pulmonary neg pulmonary ROS,    breath sounds clear to auscultation       Cardiovascular negative cardio ROS   Rhythm:regular Rate:Normal     Neuro/Psych  Headaches,  Neuromuscular disease    GI/Hepatic   Endo/Other    Renal/GU      Musculoskeletal   Abdominal   Peds  Hematology   Anesthesia Other Findings   Reproductive/Obstetrics H/o breast CA                             Anesthesia Physical Anesthesia Plan  ASA: II  Anesthesia Plan: General   Post-op Pain Management:    Induction: Intravenous  PONV Risk Score and Plan: 3 and Ondansetron, Dexamethasone, Midazolam and Treatment may vary due to age or medical condition  Airway Management Planned: Oral ETT  Additional Equipment:   Intra-op Plan:   Post-operative Plan: Extubation in OR  Informed Consent: I have reviewed the patients History and Physical, chart, labs and discussed the procedure including the risks, benefits and alternatives for the proposed anesthesia with the patient or authorized representative who has indicated his/her understanding and acceptance.       Plan Discussed with: CRNA, Anesthesiologist and Surgeon  Anesthesia Plan Comments:         Anesthesia Quick Evaluation

## 2018-12-01 NOTE — Transfer of Care (Signed)
Immediate Anesthesia Transfer of Care Note  Patient: Katelyn Shelton  Procedure(s) Performed: LIPOFILLING TO BILATERAL CHEST REVISION (Bilateral Chest) LEFT BREAST REVISION RECONSTRUCTION WITH SKIN EXCISION (Left Breast) RIGHT NIPPLE AREOLA COMPLEX RECONSTRUCTION WITH FULL THICKNESS SKIN GRAFT FROM RIGHT GROIN (Right Breast) COMPOSITE GRAFT FROM LEFT NIPPLE (NIPPLE SHARING) (Left Breast)  Patient Location: PACU  Anesthesia Type:General  Level of Consciousness: awake, alert  and oriented  Airway & Oxygen Therapy: Patient Spontanous Breathing  Post-op Assessment: Report given to RN and Post -op Vital signs reviewed and stable  Post vital signs: Reviewed and stable  Last Vitals:  Vitals Value Taken Time  BP 143/80 12/01/2018 10:02 AM  Temp    Pulse 85 12/01/2018 10:12 AM  Resp 16 12/01/2018 10:12 AM  SpO2 97 % 12/01/2018 10:12 AM  Vitals shown include unvalidated device data.  Last Pain:  Vitals:   12/01/18 0641  TempSrc: Oral  PainSc: 0-No pain         Complications: No apparent anesthesia complications

## 2018-12-01 NOTE — Op Note (Signed)
Operative Note   DATE OF OPERATION: 2.7.20  LOCATION: [facility]-outpatient  SURGICAL DIVISION: Plastic Surgery  PREOPERATIVE DIAGNOSES:  1. History right breast cancer. 2. History therapeutic radiation 3. Acquired absence breasts  POSTOPERATIVE DIAGNOSES:  same  PROCEDURE:  1. Revision left breast reconstruction with mastectomy flap skin excision 2. lipofilling to bilateral breast reconstruction total 55 ml 3. Right nipple areolar complex reconstruction with full thickness skin graft from right groin, nipple sharing composite graft  SURGEON: Irene Limbo MD MBA  ASSISTANT: Paulette Blanch PA-C  ANESTHESIA:  General.   EBL: 25 ml  COMPLICATIONS: None immediate.   INDICATIONS FOR PROCEDURE:  The patient, Katelyn Shelton, is a 43 y.o. female born on September 24, 1976, is here for staged breast reconstruction. She has undergone bilateral nipple sparing mastectomies with immediate prepectoral expander acellular dermis reconstruction. Right nipple was resected for margins. She has undergone implant placement. Plan right NAC reconstruction and additional revisions to bilateral reconstruction.   FINDINGS: 35 ml fat infiltrated over left superior pole reconstruction, 20 ml fat infiltrated right axilla and upper outer right breast reconstruction  DESCRIPTION OF PROCEDURE:  The patient's operative site was marked with the patient in the preoperative area. This included supra and infraumbilical abdomen and central chest superior to patient's scar from pectus implant placement for liposuction. Left superior pole chest and right axilla marked for lipofilling. The location of desired nipple areola reconstruction marked over right chest symmetric with left. Ellipse of skin of left mastectomy flap cephalic to IMF scar marked for excision. The patient was taken to the operating room. SCDs were placed and IV antibiotics were given. The patient's operative site was prepped and draped in a sterile fashion. A  time out was performed and all information was confirmed to be correct. Stab incision made at lateral extents of C section scar and pectus implant chest scar and tumescent infiltrated over desired areas liposuction, total 200 ml tumescent infiltrated. Liposuction performed and fat prepared with Revolve system. Prepared fat infiltrated in subcutaneous plane over right axilla and left superior pole reconstruction through stab incisions. All cannula incisions closed with simple interrupted 4-0 monocryl.  Area marked over lower pole left mastectomy flap was de epithelialized. Closure incision completed with 4-0 vicryl in dermis and 4-0 monocryl subcuticular skin closure.  The location of anticipated grafting over right chest was marked with 38 mm cookie cutter and de epithelialized. Within right groin crease, full thickness skin graft harvested. Groin closed with 3-0 vicryl in dermis and 4-0 monocryl subcuticular skin closure. Dermabond applied to left chest IMF and right groin incisions.  Skin graft prepared by removal hair follicles and soft tissue to superficial dermis. Graft inset over right chest with 5-0 plain gut suture. Inferior half of left nipple excised sharply. Left nipple incision closed primarily with 5-0 chromic suture. Nipple graft inset to right chest with 5-0 chromic. Adaptic and antibiotic ointment applied. Sterile sponge dressing applied as bolster and secured with 4-0 silk. Antibiotic ointment applied to left nipple.  Dry dressing, abdominal and breast binders applied. The patient was allowed to wake from anesthesia, extubated and taken to the recovery room in satisfactory condition.   SPECIMENS: left mastectomy flap skin  DRAINS: none  Irene Limbo, MD [facility] Plastic & Reconstructive Surgery [phone], pin [phone]

## 2018-12-01 NOTE — H&P (Signed)
Subjective:     Patient ID: Katelyn Shelton is a 43 y.o. female.  HPI  4.5 months post op implant exchange. Notes depression left upper chest. Remains bothered by fullness superior to scar of her pectoralis implant. Notes depression right axilla noted in sleeveless clothing. Notes tight feeling on right.  Presented with palpable mass right breast. MMG with mass containing heterogeneous calcifications  identified within the UOQ. Additional 3 mm group of slightly heterogeneous calcifications within the retroareolar left breast noted. US showed 4 x 1.6 x 3.5 cm mass at the 10 o'clock position 4 cm from the nipple. Biopsy demonstrated IDC with DCIS, ER/PR+, Her2-. Final pathology 1/1 SLN, right IDC spanning at least 4 cm with both retroareolar and nipple specimen positive for IDC. +LVI, perineural invasion. She underwent resection right NAC with clear margins, no further carcinoma.  Oncotype 11. Genetics negative. Mammaprint low risk. Patient completed adjuvant chemotherapy and completed adjuvant radiation 12.7.18. On arimidex.   PMH significant for silicone implant as part of pectus excavatum repair performed by Dr. Jaclynn Guarneri 1996.  Prior 32B, happy with this. Left mastectomy 289 g Right 239 g     Objective:   Physical Exam  Cardiovascular: Normal rate. Normal heart sounds Pulmonary/Chest: Effort normal. Clear to auscultation Abdominal: Soft.    Abd soft  Chest: right upper pole fuller consistent with radiation, left superior pole with some depression no visible rippling At central chest scar from pectus implant, some small fullness contour beneath this Right axilla/anterior axillary line with depression contour  SN to nipple L 22 cm Nipple to IMF L 8 cm  Assessment:     R breast ca UOQ Pectus excavatum, s/p silicone implant placement S/p bilateral NSM, right SLN, TE/ADM (Alloderm) reconstruction S/p excision right NAC Adjuvant chemotherapy, radiation S/p silicone  implant exchange, lipofilling, excision nevus abdomen    Plan:     With regards to tight feeling on right- 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 In response to her questions, a smaller implant in my opinion would not lessen this feeling as the soft tissue envelope wound contract around any size implant. She asked if there is an implant that is taller that can be used on left to address depression- reviewed that this is anatomic implant, that all current anatomic implants are textured, reviewed incidence texturing with ALCL. A larger round implant would also mean "taller" but this would also mean wider and possibly more projecting and she does not desire this. Given resection NAC and radiation on right she has developed some asymmetry shape, she placed finger along IMF and demonstrates how this improved upper pole fullness on left. Counseled excision of skin at [facility] of left mastectomy flap could raise implant position, but this could also stretch out over time and I demonstrated how significant shortening of nipple to IMF distance will displace the NAC to over lower pole implant. She would like to try small excision skin, states at my discretion to limit NAC malposition.  Plan additional fat grafting to address left upper chest depression right lateral chest depression. Counseled additional fat to right chest I do not anticipate will increase her tight feeling on right. Reviewed depression in this area from combination axillary node sampling, radiation, and border capsule/reconstruction. Reviewed risks fat grafting include variable take, cysts, fat necrosis.   Plan liposuction in area of pectoralis implant scar- noted weight changes since diagnosis and treatment have contributed to this. She places hands on chest or abdomen to  make skin taught to flatten contour- reviewed I will need to make some type incision to perform liposuction in area. I can excise skin in area to try to  mimic what she is doing with her hands but would produce fresh scar that would taken months to mature. She states she leaves to my discretion.   With regards to NAC reconstruction, discussed 3d tattoo, local flap with tattoo vs FTSG for areola and nipple sharing with tattoo or FTSG for areola. Plan for nipple sharing and FTSG from groin. Reviewed bolster dressing, donor site pain scar limitations. Counseled grafted skin will change color may not be match to native NAC may require additional tattoo to match color. Reviewed one option to delay this until she is happy with appearance left breast- reviewed removing some skin at left IMF will change NAC position. This position on left may change with healing, stretching. She desires to proceed with all discussed procedures in one setting. Plan OP surgery. No drains anticipated. Reviewed compression garment in areas liposuction.  55 ml fat infiltrated over right chest, 40 ml over left chest.  Natrelle Inspira Full Projection 415 ml implants placed bilateral. REF [id number]    Irene Limbo, MD [facility] Plastic & Reconstructive Surgery [phone], pin [phone]

## 2018-12-01 NOTE — Op Note (Signed)
First Assist Op Note: [facility] I assisted the Surgeon(s) __Dr. Arnoldo Hooker Thimmappa__ on the procedure(s): __Lipofilling to bilateral chest revision, Left breast revision reconstruction with skin excision, right nipple areola complex reconstruction with full thickness skin graft from right groin, composite graft from left nipple (nipple sharing)__on Date _2/04/2019   I provided my assistance on this case as follows:   I was present and acted as first Environmental consultant during this operation. I was present during the patient transport into the operative suite and assisted the OR staff with transferring and positioning of the patient. All extremities were checked and properly cushioned and safety straps in place. I was involved in the prepping and placement of sterile drapes. A time out was performed and all information confirmed to be correct.  I first assisted during the case including retraction for exposure, assisting with closure of surgical wounds and application of sterile dressings. I provided assistance with application of post operative garments/splinting and assisted with patient transfer back to the stretcher as needed.  Caren Macadam PA-C Plastic Surgery [phone]

## 2018-12-03 IMAGING — CR DG THORACIC SPINE 2V
3 series · 3 of 3 positions shown · non-contrast
Comparison: Chest radiograph, 12/21/2013

CLINICAL DATA: Pt c/o left sided dorsum pain at rib/spine area, no
injury, hx cancer.

EXAM:
THORACIC SPINE 2 VIEWS

[t t-spine a.p.]
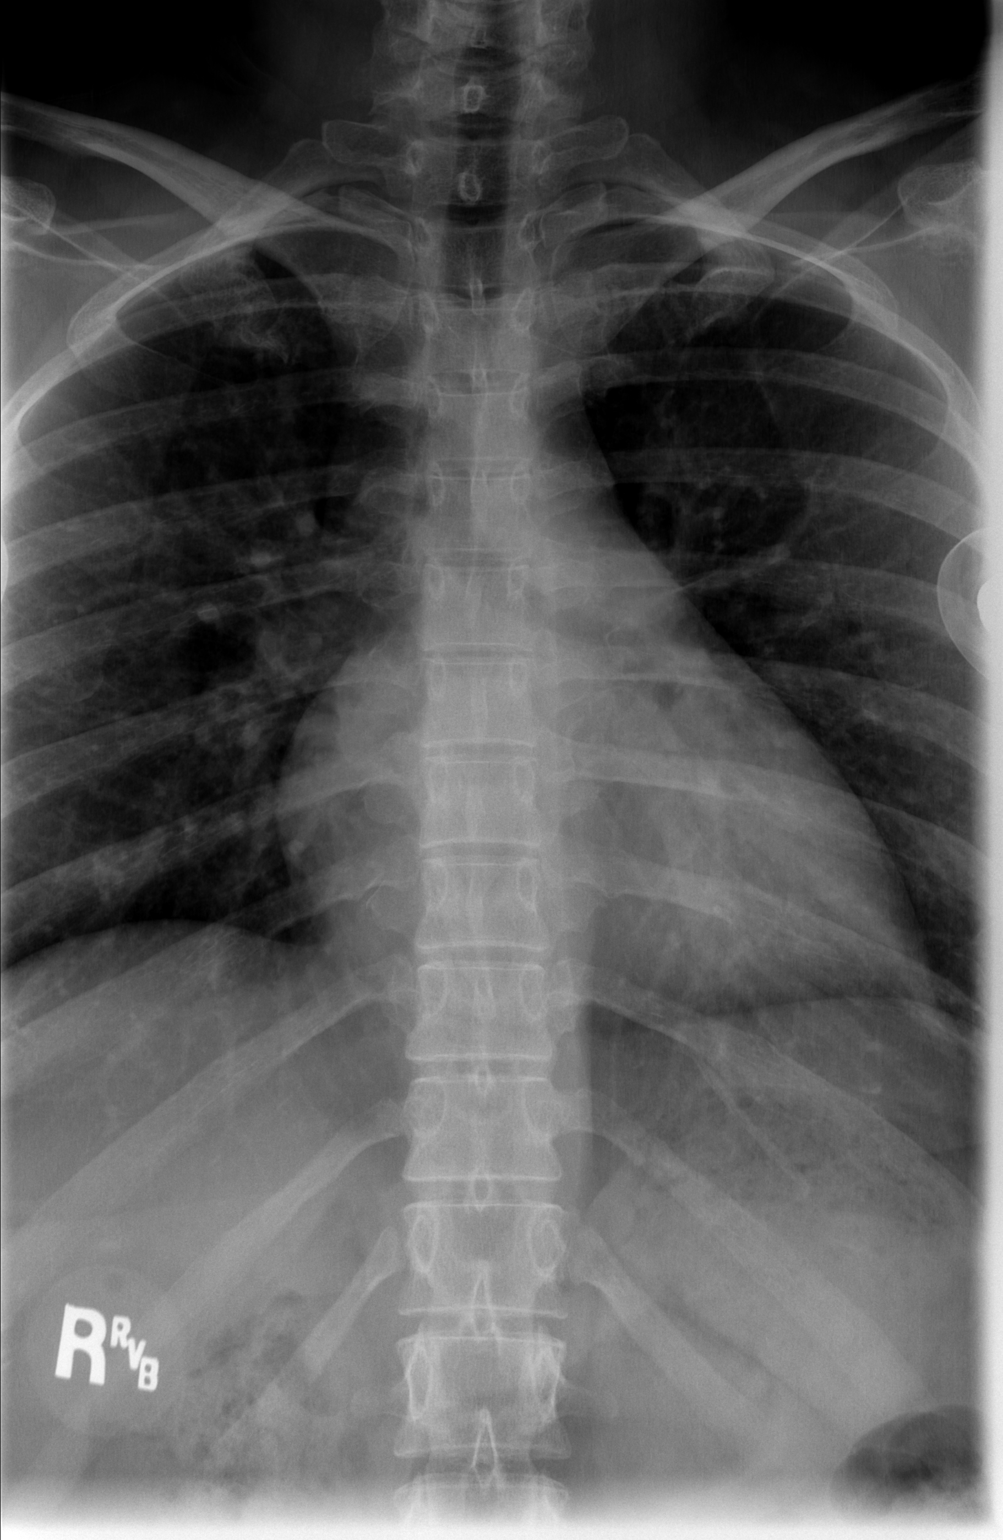

[t t-spine lat]
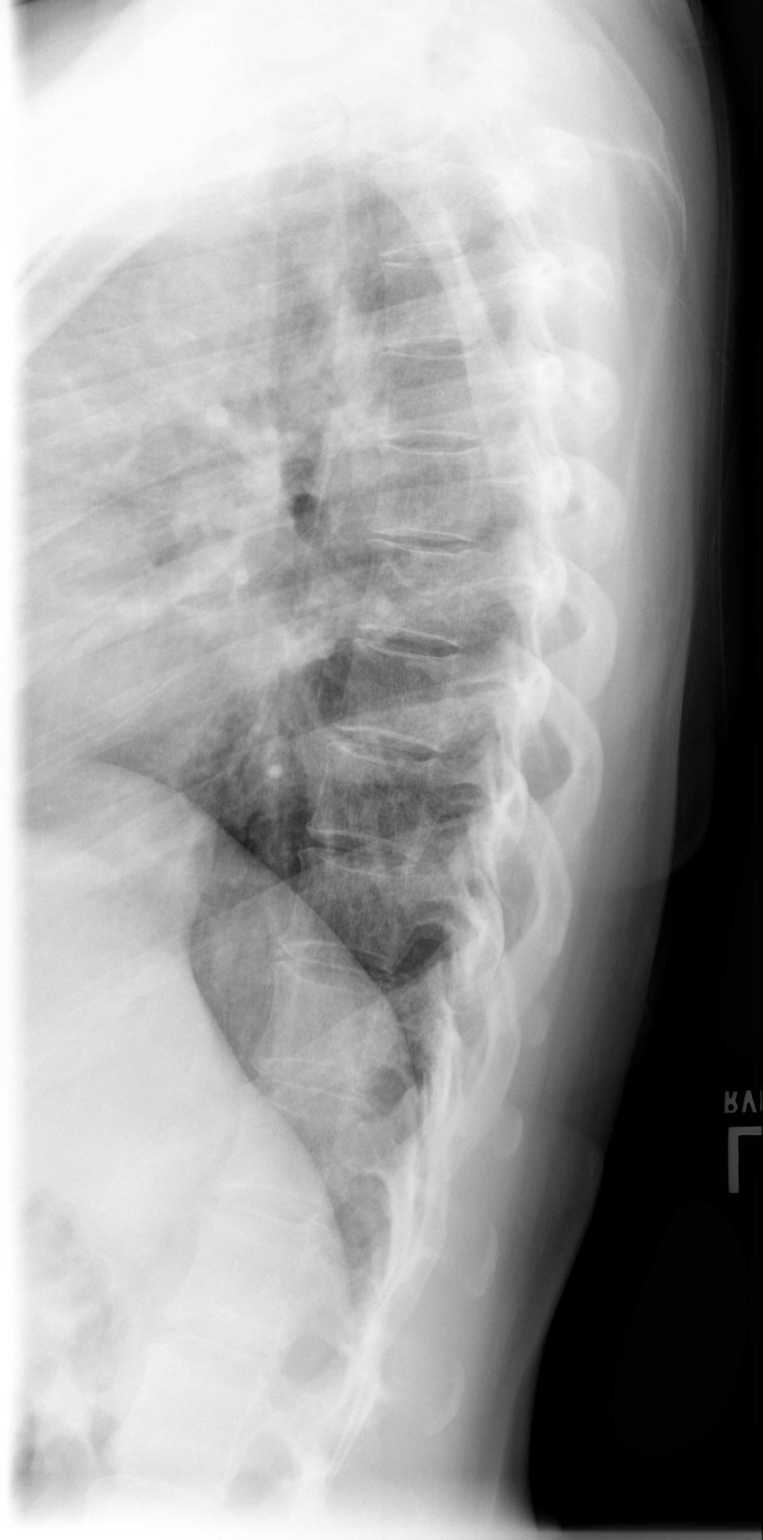

[t swimmers *]
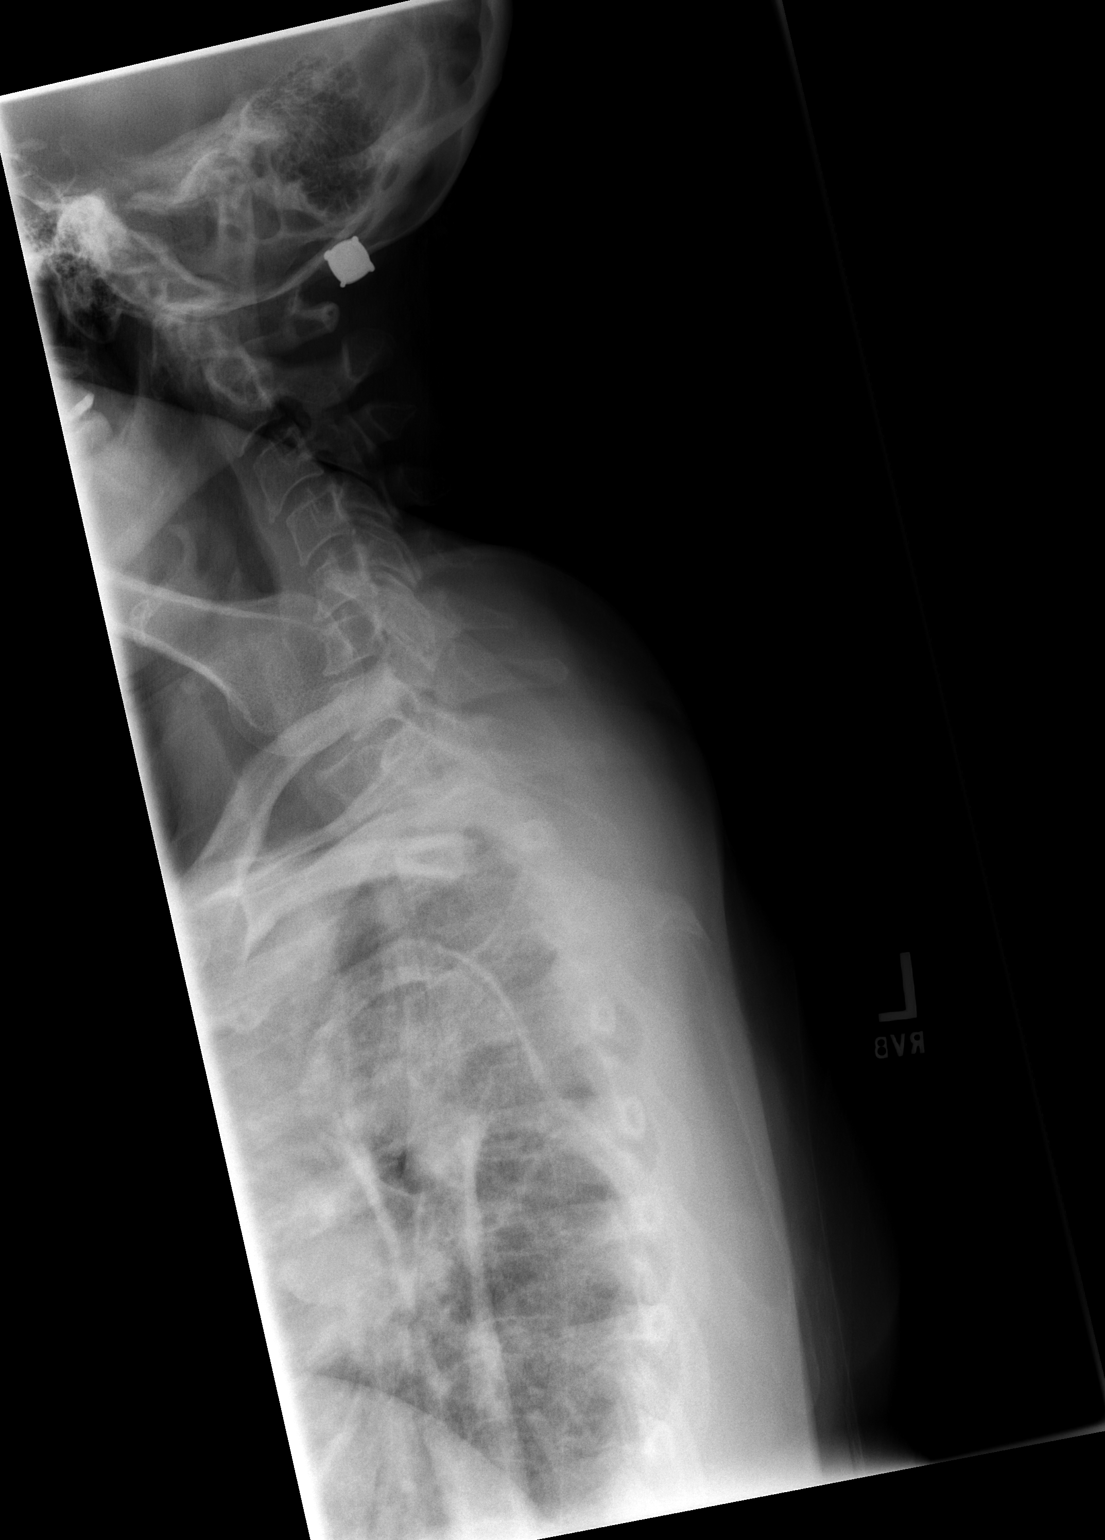

[3 of 3 positions shown; findings below may reference images not displayed]

FINDINGS: There is no evidence of thoracic spine fracture. Alignment is
normal. No other significant bone abnormalities are identified.
IMPRESSION: Negative.

## 2018-12-04 ENCOUNTER — Encounter (HOSPITAL_BASED_OUTPATIENT_CLINIC_OR_DEPARTMENT_OTHER): Payer: Self-pay | Admitting: Plastic Surgery

## 2018-12-04 NOTE — Anesthesia Postprocedure Evaluation (Signed)
Anesthesia Post Note  Patient: Katelyn Shelton  Procedure(s) Performed: LIPOFILLING TO BILATERAL CHEST REVISION (Bilateral Chest) LEFT BREAST REVISION RECONSTRUCTION WITH SKIN EXCISION (Left Breast) RIGHT NIPPLE AREOLA COMPLEX RECONSTRUCTION WITH FULL THICKNESS SKIN GRAFT FROM RIGHT GROIN (Right Breast) COMPOSITE GRAFT FROM LEFT NIPPLE (NIPPLE SHARING) (Left Breast)     Patient location during evaluation: PACU Anesthesia Type: General Level of consciousness: awake and alert Pain management: pain level controlled Vital Signs Assessment: post-procedure vital signs reviewed and stable Respiratory status: spontaneous breathing, nonlabored ventilation, respiratory function stable and patient connected to nasal cannula oxygen Cardiovascular status: blood pressure returned to baseline and stable Postop Assessment: no apparent nausea or vomiting Anesthetic complications: no    Last Vitals:  Vitals:   12/01/18 1100 12/01/18 1115  BP:  126/80  Pulse: 92 71  Resp: 12 16  Temp:  36.9 C  SpO2: 99% 97%    Last Pain:  Vitals:   12/01/18 1115  TempSrc:   PainSc: 2                  [name] S

## 2018-12-10 IMAGING — CT CT L SPINE W/ CM
3 of 5 series · 8 of 33 positions shown, 9 images · IV contrast (agent unspecified)
Comparison: Portions of the spine could be seen on CT abdomen and
pelvis 12/21/2013.

CONTRAST:  Isovue 300, 100 mL.

CLINICAL DATA: Breast cancer.  Back pain.

EXAM:
CT THORACIC AND LUMBAR SPINE WITH CONTRAST
TECHNIQUE: Multidetector CT imaging of the thoracic and lumbar spine was
performed with contrast. Multiplanar CT image reconstructions were
also generated.

[Series 4: t spine soft · axial · 0.29mm/px · z∈[-414,-198]mm · 2 of 234 slices shown, 3 images]
[im 72/234  soft-tissue]
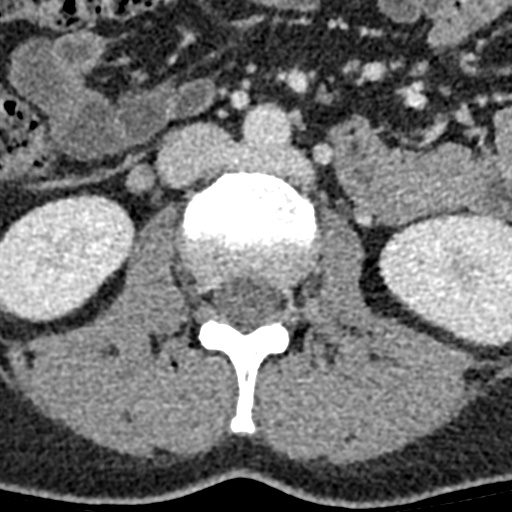
[im 72/234  bone]
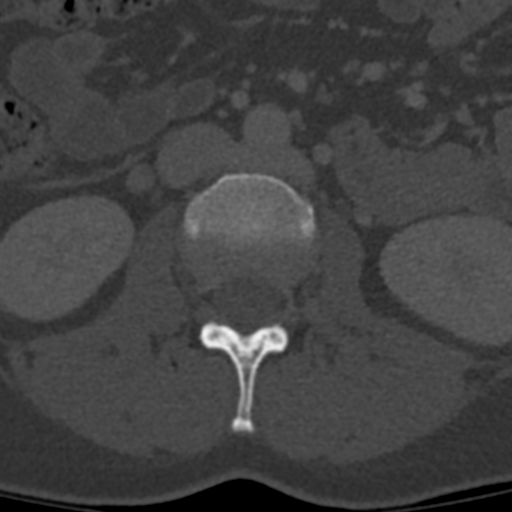
[im 180/234  bone]
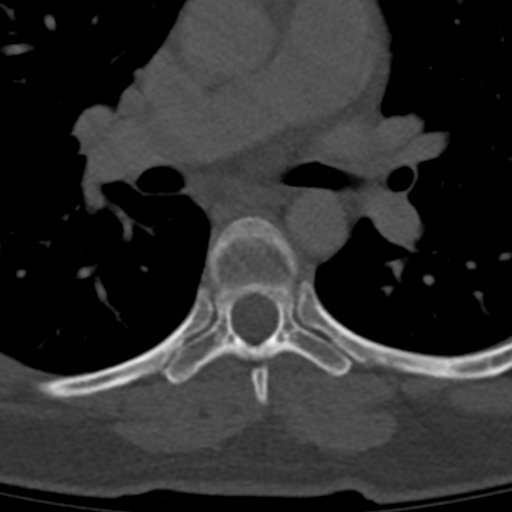

[Series 6: sagittal bone · sagittal · 0.32mm/px · 5 of 61 slices shown]
[im 11/61  bone]
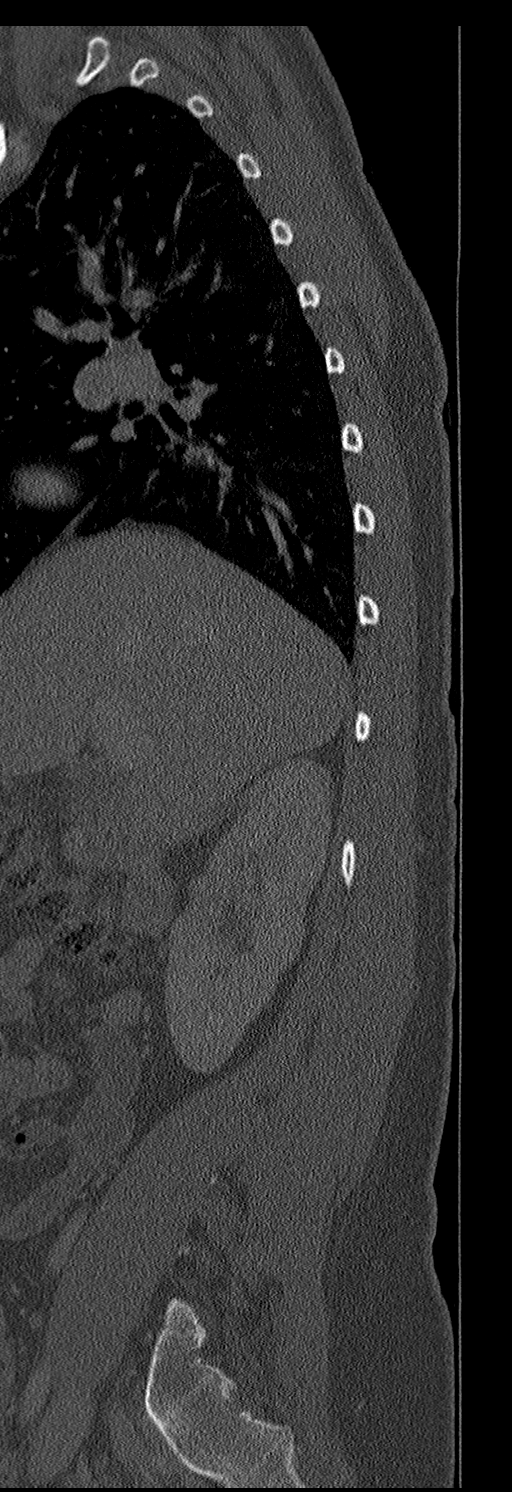
[im 21/61  bone]
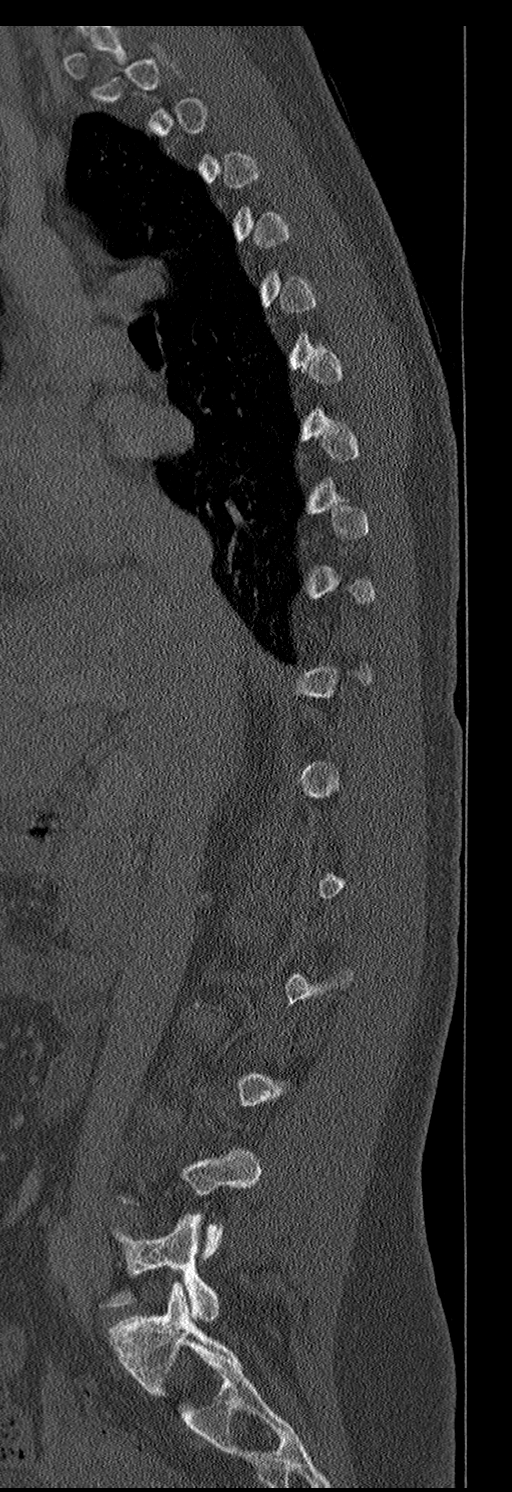
[im 31/61  bone]
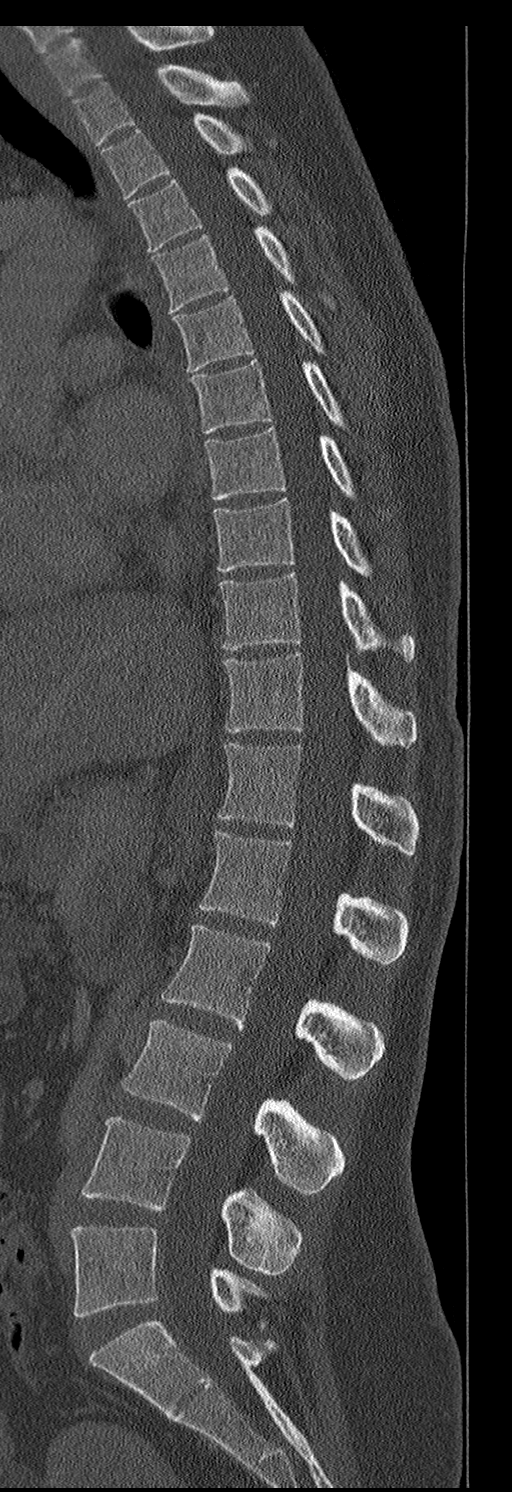
[im 41/61  bone]
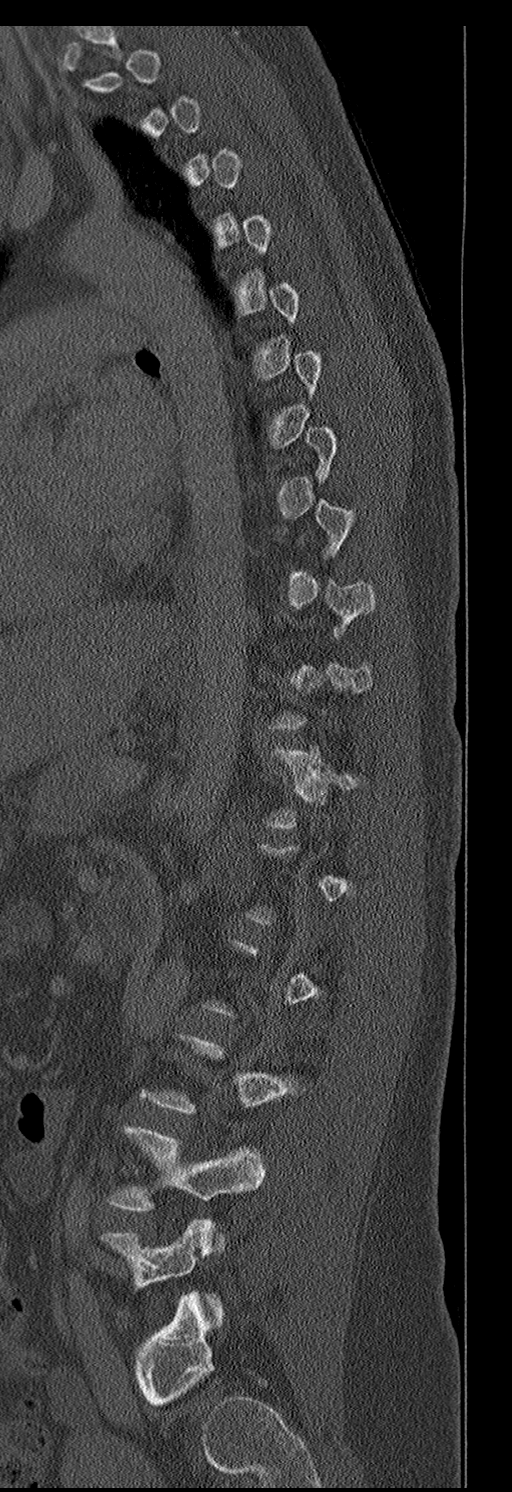
[im 51/61  bone]
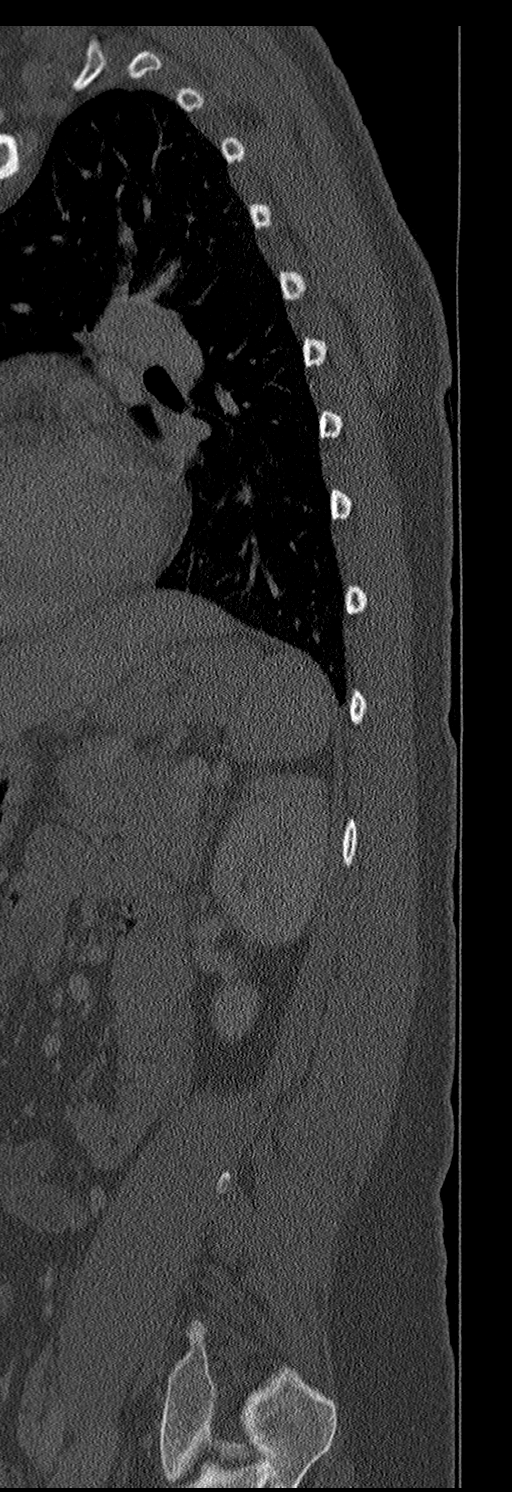

[Series 7: coronal bone · coronal · 0.33mm/px · 1 of 77 slices shown]
[im 39/77  bone]
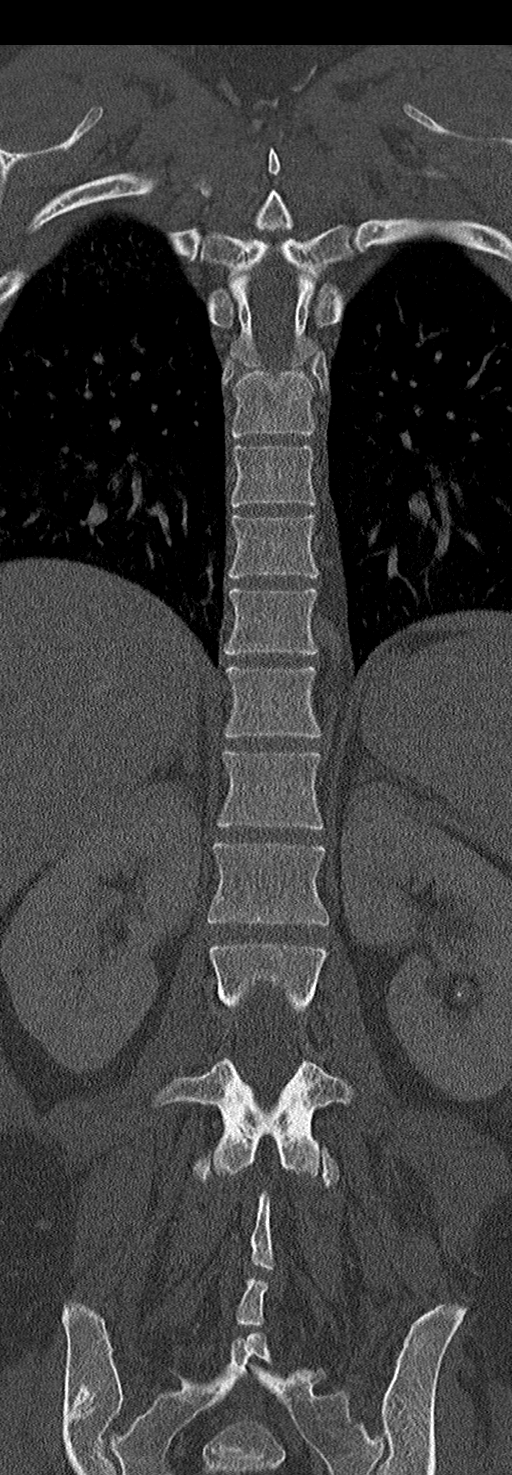

[8 of 33 positions shown; findings below may reference images not displayed]

FINDINGS: CT THORACIC SPINE FINDINGS

Alignment: Normal.

Vertebrae: No acute fracture or focal pathologic process.

Paraspinal and other soft tissues: Negative.

Disc levels: No visible disc protrusion or spinal stenosis.

CT LUMBAR SPINE FINDINGS

Segmentation: Standard.

Alignment: Anatomic.

Vertebrae: No worrisome osseous lesion. There is sclerosis of the a
RIGHT posterior iliac bone, unchanged from 5348 consistent with a
bone island.

Paraspinal and other soft tissues: Unremarkable.

Disc levels: No disc protrusion or spinal stenosis.
IMPRESSION: CT THORACIC SPINE IMPRESSION

Negative.

CT LUMBAR SPINE IMPRESSION

Negative.

## 2018-12-13 ENCOUNTER — Telehealth: Payer: Self-pay | Admitting: Oncology

## 2018-12-13 NOTE — Telephone Encounter (Signed)
[facility] CME 3/12 - moved f/u to [facility] and adjusted associated appointments. Spoke with patient.

## 2018-12-15 ENCOUNTER — Other Ambulatory Visit: Payer: Self-pay | Admitting: Oncology

## 2018-12-15 MED FILL — REPATHA SURECLICK 140 MG/ML: 140 | 28 days supply | Qty: 2 | Fill #7

## 2018-12-15 MED FILL — ANASTROZOLE 1 MG TABLET: 1 | 90 days supply | Qty: 90 | Fill #0

## 2018-12-27 DIAGNOSIS — D225 Melanocytic nevi of trunk: Secondary | ICD-10-CM | POA: Diagnosis not present

## 2018-12-27 DIAGNOSIS — D2262 Melanocytic nevi of left upper limb, including shoulder: Secondary | ICD-10-CM | POA: Diagnosis not present

## 2018-12-27 DIAGNOSIS — D2271 Melanocytic nevi of right lower limb, including hip: Secondary | ICD-10-CM | POA: Diagnosis not present

## 2018-12-27 DIAGNOSIS — D1801 Hemangioma of skin and subcutaneous tissue: Secondary | ICD-10-CM | POA: Diagnosis not present

## 2018-12-27 DIAGNOSIS — L718 Other rosacea: Secondary | ICD-10-CM | POA: Diagnosis not present

## 2018-12-27 DIAGNOSIS — D2272 Melanocytic nevi of left lower limb, including hip: Secondary | ICD-10-CM | POA: Diagnosis not present

## 2018-12-27 DIAGNOSIS — L218 Other seborrheic dermatitis: Secondary | ICD-10-CM | POA: Diagnosis not present

## 2019-01-04 ENCOUNTER — Ambulatory Visit: Payer: Self-pay

## 2019-01-04 ENCOUNTER — Inpatient Hospital Stay: Payer: 59

## 2019-01-04 ENCOUNTER — Other Ambulatory Visit: Payer: Self-pay | Admitting: *Deleted

## 2019-01-04 ENCOUNTER — Encounter: Payer: Self-pay | Admitting: Adult Health

## 2019-01-04 ENCOUNTER — Ambulatory Visit: Payer: Self-pay | Admitting: Oncology

## 2019-01-04 ENCOUNTER — Other Ambulatory Visit: Payer: Self-pay

## 2019-01-04 ENCOUNTER — Inpatient Hospital Stay (HOSPITAL_BASED_OUTPATIENT_CLINIC_OR_DEPARTMENT_OTHER): Payer: 59 | Admitting: Adult Health

## 2019-01-04 ENCOUNTER — Telehealth: Payer: Self-pay | Admitting: Adult Health

## 2019-01-04 ENCOUNTER — Inpatient Hospital Stay: Payer: 59 | Attending: Oncology

## 2019-01-04 VITALS — BP 115/70 | HR 80 | Temp 98.2°F | Resp 18 | Ht 60.0 in | Wt 135.4 lb

## 2019-01-04 DIAGNOSIS — C50411 Malignant neoplasm of upper-outer quadrant of right female breast: Secondary | ICD-10-CM

## 2019-01-04 DIAGNOSIS — E785 Hyperlipidemia, unspecified: Secondary | ICD-10-CM | POA: Diagnosis not present

## 2019-01-04 DIAGNOSIS — Z79899 Other long term (current) drug therapy: Secondary | ICD-10-CM | POA: Diagnosis not present

## 2019-01-04 DIAGNOSIS — Z9013 Acquired absence of bilateral breasts and nipples: Secondary | ICD-10-CM | POA: Diagnosis not present

## 2019-01-04 DIAGNOSIS — M545 Low back pain, unspecified: Secondary | ICD-10-CM

## 2019-01-04 DIAGNOSIS — G4700 Insomnia, unspecified: Secondary | ICD-10-CM

## 2019-01-04 DIAGNOSIS — N899 Noninflammatory disorder of vagina, unspecified: Secondary | ICD-10-CM | POA: Insufficient documentation

## 2019-01-04 DIAGNOSIS — Z79811 Long term (current) use of aromatase inhibitors: Secondary | ICD-10-CM | POA: Diagnosis not present

## 2019-01-04 DIAGNOSIS — Z170 Estrogen receptor positive status [ER+]: Secondary | ICD-10-CM

## 2019-01-04 DIAGNOSIS — M8580 Other specified disorders of bone density and structure, unspecified site: Secondary | ICD-10-CM | POA: Insufficient documentation

## 2019-01-04 DIAGNOSIS — M2550 Pain in unspecified joint: Secondary | ICD-10-CM | POA: Diagnosis not present

## 2019-01-04 DIAGNOSIS — Z803 Family history of malignant neoplasm of breast: Secondary | ICD-10-CM

## 2019-01-04 DIAGNOSIS — Z8614 Personal history of Methicillin resistant Staphylococcus aureus infection: Secondary | ICD-10-CM

## 2019-01-04 DIAGNOSIS — Z8042 Family history of malignant neoplasm of prostate: Secondary | ICD-10-CM | POA: Diagnosis not present

## 2019-01-04 DIAGNOSIS — R5383 Other fatigue: Secondary | ICD-10-CM | POA: Diagnosis not present

## 2019-01-04 DIAGNOSIS — E7849 Other hyperlipidemia: Secondary | ICD-10-CM

## 2019-01-04 DIAGNOSIS — Z800 Family history of malignant neoplasm of digestive organs: Secondary | ICD-10-CM | POA: Insufficient documentation

## 2019-01-04 LAB — CMP (CANCER CENTER ONLY)
ALT: 34 U/L (ref 10–47)
AST: 37 U/L (ref 11–38)
Albumin: 4.1 g/dL (ref 3.5–5.0)
Alkaline Phosphatase: 46 U/L (ref 38–126)
Anion gap: 10 (ref 5–15)
BUN: 12 mg/dL (ref 6–20)
CO2: 26 mmol/L (ref 22–32)
Calcium: 9.4 mg/dL (ref 8.9–10.3)
Chloride: 105 mmol/L (ref 98–111)
Creatinine: 0.72 mg/dL (ref 0.60–1.20)
GFR, Est AFR Am: >60 mL/min (ref >60)
GFR, Estimated: >60 mL/min (ref >60)
Glucose, Bld: 76 mg/dL (ref 70–99)
Potassium: 4.1 mmol/L (ref 3.5–5.1)
Sodium: 141 mmol/L (ref 135–145)
Total Bilirubin: 0.5 mg/dL (ref 0.2–1.6)
Total Protein: 7.6 g/dL (ref 6.5–8.1)

## 2019-01-04 LAB — LIPID PANEL
Cholesterol: 131 mg/dL (ref 0–200)
HDL: 53 mg/dL (ref >40)
LDL Cholesterol: 38 mg/dL (ref 0–99)
Total CHOL/HDL Ratio: 2.5 RATIO
Triglycerides: 198 mg/dL — ABNORMAL HIGH (ref <150)
VLDL: 40 mg/dL (ref 0–40)

## 2019-01-04 MED ORDER — VENLAFAXINE HCL ER 37.5 MG PO CP24: 37.5000 mg | ORAL_CAPSULE | Freq: Every day | ORAL | 5 refills | Status: DC

## 2019-01-04 MED ORDER — DENOSUMAB 60 MG/ML ~~LOC~~ SOSY
PREFILLED_SYRINGE | SUBCUTANEOUS | Status: AC
  Filled 2019-01-04: qty 1

## 2019-01-04 MED ORDER — DENOSUMAB 60 MG/ML ~~LOC~~ SOLN
60.0000 mg | Freq: Once | SUBCUTANEOUS | Status: AC
  Administered 2019-01-04: 60 mg via SUBCUTANEOUS

## 2019-01-04 MED FILL — VENLAFAXINE HCL ER 37.5 MG: 37.5 | 30 days supply | Qty: 30 | Fill #0 | Status: TO

## 2019-01-04 NOTE — Progress Notes (Signed)
Calcium level normal per Wilber Bihari NP ok to give Prolia

## 2019-01-04 NOTE — Progress Notes (Signed)
[facility]  [phone] Fax:([phone]     ID: Katelyn Shelton DOB: 20-Jul-1976  MR#: [id number]  OZH#:[id number]  Patient Care Team: Marin Olp, MD as PCP - General (Family Medicine) Magrinat, Virgie Dad, MD as Consulting Physician (Oncology) Rolm Bookbinder, MD as Consulting Physician (General Surgery) Louretta Shorten, MD as Consulting Physician (Obstetrics and Gynecology) Juanita Craver Gerrie Nordmann, MD as Referring Physician (Hematology and Oncology) Kyung Rudd, MD as Consulting Physician (Radiation Oncology) Irene Limbo, MD as Consulting Physician (Plastic Surgery) Sorscher, Danice Goltz, MD as Referring Physician (Hematology and Oncology) Muss, Demaris Callander as Consulting Physician (Internal Medicine) Kathrynn Ducking, MD as Consulting Physician (Neurology) Dorothy Spark, MD as Consulting Physician (Cardiology) OTHER MD:  CHIEF COMPLAINT: Estrogen receptor positive breast cancer  CURRENT TREATMENT: anastrozole  INTERVAL HISTORY: Katelyn Shelton returns today for follow-up and treatment of her estrogen receptor positive breast cancer.  She has continued with struggling taking the Anastrozole.  She notes that she has had continued joint aches and pains and vaginal dryness, decreased libido that has interfered with her sex life.  She is frustrated with this.  She has undergone pelvic rehab, and mona lisa touch, but the issues have remained.  She has an appointment with a second opinion with Dr. Montez Morita at [facility] to review her opinion on the Anastrozole and updates in research.    REVIEW OF SYSTEMS: Mayelin is tired and has difficulty sleeping at night.  She notes that she is interrupted some by her husband who goes to bed late.  She does take naps to help catch up on her sleep.  She is fatigued.  She has three children and thinks her several surgeries, christmas, and busyness with them has helped keep the fatigue around.  She was offered to decrease her  Effexor at her last appointment to see if that might help.  She wants to know if she should do this.  Brendy is exercising 2-5 times per week.    Maily has had some lower back pain that has been present for about 2-4 weeks.  She says sometimes the pain will make it difficult to get dressed in the morning.  She denies any focal weakness in her legs, or numbness/tingling.  She denies any saddle anesthesia/bowel/bladder incontinence.    Khloey is eating and drinking well.  Her weight is stable.  She denies fevers, chills, lymphadenopathy.  She is without nausea, vomiting, bowel or bladder changes.  She hasn't noted chest pain, palpitations, cough, or shortness of breath.  She denies anxiety or depression.  A detailed ROS was otherwise non contributory.    BREAST CANCER HISTORY: From the original intake note:  "Katelyn Shelton" saw Dr. Corinna Capra for routine follow-up and was found to have a palpable mass in the upper outer quadrant of her right breast and was set up for bilateral diagnostic mammography with tomography and right breast ultrasonography at [facility] 02/08/2017. The breast density was category C. In the upper outer quadrant of the right breast there was an irregular mass. There was also a 0.3 cm group of slightly heterogeneous calcifications in the retroareolar left breast. On exam there was indeed a firm palpable mass at the 10:00 position of the right breast 4 cm from the nipple. On ultrasound this measured 3.5 cm, it was irregular and hypoechoic. There was no abnormal right axillary adenopathy.  Biopsy of the right breast mass in question 02/08/2017 showed invasive ductal carcinoma, grade 2, estrogen receptor 90% positive,  90% positive, both with strong staining intensity, with an MIB-1 of 5%, and no HER-2 amplification by immunohistochemistry (1+). The area of left breast calcifications was biopsied 02/09/2017 and showed only fibrocystic changes. (SAA [id number]).  Her subsequent history is as  detailed below.  PAST MEDICAL HISTORY: Past Medical History:  Diagnosis Date  . Allergy   . Anxiety    venflafaxine XR 75 mg  . Breast cancer ([place]) 01/2017   right; genetic testing negative in 2018 with [facility] panel  . Heart murmur    as child- went away  . History of MRSA infection    elbow  . Hyperlipidemia   . Joint pain    due to medicine induced joint pain  . Migraine    otc med prn  . Miscarriage    x1  . Neuromuscular disorder ([place])    neuropathy bilater feet - ? 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 Osteopenia   . UTI (urinary tract infection)     PAST SURGICAL HISTORY: Past Surgical History:  Procedure Laterality Date  . AREOLA/NIPPLE RECONSTRUCTION WITH GRAFT Right 12/01/2018   Procedure: RIGHT NIPPLE AREOLA COMPLEX RECONSTRUCTION WITH FULL THICKNESS SKIN GRAFT FROM RIGHT GROIN;  Surgeon: Irene Limbo, MD;  Location: [place];  Service: Plastics;  Laterality: Right;  . BREAST RECONSTRUCTION Left 12/01/2018   Procedure: LEFT BREAST REVISION RECONSTRUCTION WITH SKIN EXCISION;  Surgeon: Irene Limbo, MD;  Location: [place];  Service: Plastics;  Laterality: Left;  . BREAST RECONSTRUCTION Left 12/01/2018   Procedure: COMPOSITE GRAFT FROM LEFT NIPPLE (NIPPLE SHARING);  Surgeon: Irene Limbo, MD;  Location: [place];  Service: Plastics;  Laterality: Left;  . BREAST RECONSTRUCTION WITH PLACEMENT OF TISSUE EXPANDER AND FLEX HD (ACELLULAR HYDRATED DERMIS) Bilateral 02/28/2017   Procedure: BILATERAL BREAST RECONSTRUCTION WITH PLACEMENT OF TISSUE EXPANDER AND ALLODERM;  Surgeon: Irene Limbo, MD;  Location: [place];  Service: Plastics;  Laterality: Bilateral;  . CESAREAN SECTION  2008,2010,2013   x 3  . CESAREAN SECTION  02/04/2012   Procedure: CESAREAN SECTION;  Surgeon: Cyril Mourning, MD;  [facility];  Service: Gynecology;  Laterality: N/A;  Repeat Cesarean Section Delivery  Boy  @  [id number], Apgars 9/10  .  COLONOSCOPY     polyp  . DILATION AND CURETTAGE OF UTERUS    . EXCISION OF BREAST LESION Right 03/15/2017   Procedure: RIGHT NIPPLE AND AREOLA EXCISION;  Surgeon: Rolm Bookbinder, MD;  Location: [place];  Service: General;  Laterality: Right;  . LAPAROSCOPIC VAGINAL HYSTERECTOMY WITH SALPINGO OOPHORECTOMY Bilateral 06/29/2018   Procedure: LAPAROSCOPIC ASSISTED VAGINAL HYSTERECTOMY WITH SALPINGO OOPHORECTOMY;  Surgeon: Louretta Shorten, MD;  Location: [facility];  Service: Gynecology;  Laterality: Bilateral;  Request case for [facility] CRNA  . LIPOSUCTION WITH LIPOFILLING N/A 07/25/2018   Procedure: LIPOSUCTION WITH LIPOFILLING FROM ABDOMEN TO CHEST;  Surgeon: Irene Limbo, MD;  Location: [place];  Service: Plastics;  Laterality: N/A;  . LIPOSUCTION WITH LIPOFILLING Bilateral 12/01/2018   Procedure: LIPOFILLING TO BILATERAL CHEST REVISION;  Surgeon: Irene Limbo, MD;  Location: [place];  Service: Plastics;  Laterality: Bilateral;  . NIPPLE SPARING MASTECTOMY Bilateral 02/28/2017   Procedure: RIGHT NIPPLE SPARING MASTECTOMY; LEFT PROPHYLACTIC NIPPLE SPARING MASTECTOMY;  Surgeon: Rolm Bookbinder, MD;  Location: [place];  Service: General;  Laterality: Bilateral;  . PECTUS EXCAVATUM REPAIR  [id number]   43 years old- very large implant  . REMOVAL OF TISSUE EXPANDER AND  PLACEMENT OF IMPLANT Bilateral 07/25/2018   Procedure: REMOVAL OF TISSUE EXPANDER AND PLACEMENT OF SILICONE IMPLANT;  Surgeon: Irene Limbo, MD;  Location: [place];  Service: Plastics;  Laterality: Bilateral;  . SENTINEL NODE BIOPSY Right 02/28/2017   Procedure: RIGHT AXILLARY SENTINEL LYMPH  NODE BIOPSY;  Surgeon: Rolm Bookbinder, MD;  Location: [place];  Service: General;  Laterality: Right;  . VULVAR LESION REMOVAL    . WISDOM TOOTH EXTRACTION      FAMILY HISTORY Family History  Problem Relation Age of Onset  . Skin cancer  Mother   . Hyperlipidemia Mother   . Hypertension Mother   . Colon polyps Mother   . Hyperlipidemia Father   . Parkinson's disease Sister 51       early on set  . Miscarriages / Stillbirths Sister   . Diabetes Maternal Aunt   . Colon polyps Maternal Uncle   . Heart failure Maternal Grandmother   . Stroke Maternal Grandmother   . Heart attack Maternal Grandfather   . Depression Maternal Grandfather   . Early death Maternal Grandfather        29 MI  . Stomach cancer Paternal Grandmother   . Prostate cancer Paternal Grandfather   . Alzheimer's disease Paternal Grandfather   . Breast cancer Cousin        mother's paternal first FEMALE cousin  . Colon polyps Sister   . Colon cancer Neg Hx   . Rectal cancer Neg Hx    The patient's father, Carolyne Fiscal, is an Administrator, Civil Service, currently 42 years old. The patient's mother is 89 years old as of April 2018. Patient has no brothers, 2 sisters. There is a history breast cancer in the family in a great aunt on the patient's father's side. On the maternal side there is a female first cousin of the patient's mother with breast cancer. There is no history of ovarian cancer in the family  GYNECOLOGIC HISTORY:  Patient's last menstrual period was 09/01/2017. Katelyn Shelton had her first menstrual period age 67, her first live birth age 17. She has had 3 C-sections. Her periods are approximately every 21 days, with one heavy day. Her husband is status post vasectomy.  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 Her husband [name] is one of our cardiologists. The patient attends [employer] DIRECTIVES:    HEALTH MAINTENANCE: Social History   Tobacco Use  . Smoking status: Never Smoker  . Smokeless tobacco: Never Used  Substance Use Topics  . Alcohol use: Yes    Alcohol/week: 1.0 - 2.0 standard drinks    Types: 1 - 2 Standard drinks or equivalent per week    Comment: occasional  wine - twice monthly  . Drug use: No     Colonoscopy:  PAP:  Bone density: 11/25/2017 showed a T score of -2.0 osteopenic   Allergies  Allergen Reactions  . Fentanyl Itching    Severe itching - Patient refuses this medication  . Peach Flavor Anaphylaxis  . Penicillins Hives    AS A CHILD  . Sulfa Antibiotics Nausea And Vomiting  . Sulfasalazine Nausea And Vomiting and Other (See Comments)  . Latex Itching    Current Outpatient Medications  Medication Sig Dispense Refill  . acetaminophen (TYLENOL) 325 MG tablet Take 650 mg by mouth every 6 (six) hours as needed.    [name] anastrozole (ARIMIDEX) 1 MG tablet TAKE  1 TABLET BY MOUTH DAILY 90 tablet 4  . Calcium Carbonate (CALCIUM 600 PO) Take 1 capsule by mouth daily.     [name] denosumab (PROLIA) 60 MG/ML SOSY injection Inject 60 mg into the skin every 6 (six) months.    . Evolocumab (REPATHA SURECLICK) 751 MG/ML SOAJ Inject 1 pen into the skin every 14 (fourteen) days. 2 pen 11  . Multiple Vitamins-Minerals (MULTIVITAMIN WOMEN 50+) TABS Take 1 tablet by mouth daily.     [name] venlafaxine XR (EFFEXOR-XR) 37.5 MG 24 hr capsule Take 1 capsule (37.5 mg total) by mouth daily with breakfast. 30 capsule 5  . venlafaxine XR (EFFEXOR-XR) 75 MG 24 hr capsule Take 1 capsule (75 mg total) by mouth daily with breakfast. 90 capsule 3   No current facility-administered medications for this visit.     OBJECTIVE:  Vitals:   01/04/19 1009  BP: 115/70  Pulse: 80  Resp: 18  Temp: 98.2 F (36.8 C)  SpO2: 99%     Body mass index is 26.44 kg/m.    ECOG FS:1 - Symptomatic but completely ambulatory  GENERAL: Patient is a well appearing female in no acute distress HEENT:  Sclerae anicteric.  Oropharynx clear and moist. No ulcerations or evidence of oropharyngeal candidiasis. Neck is supple.  NODES:  No cervical, supraclavicular, or axillary lymphadenopathy palpated.  BREAST EXAM:  S/p bilateral mastectomies with implant placement.  No sign of local recurrence  noted LUNGS:  Clear to auscultation bilaterally.  No wheezes or rhonchi. HEART:  Regular rate and rhythm. No murmur appreciated. ABDOMEN:  Soft, nontender.  Positive, normoactive bowel sounds. No organomegaly palpated. MSK:  No focal spinal tenderness to palpation. Full range of motion bilaterally in the upper extremities. EXTREMITIES:  No peripheral edema.   SKIN:  Clear with no obvious rashes or skin changes. No nail dyscrasia. NEURO:  Nonfocal. Well oriented.  Appropriate affect.    LAB RESULTS:  CMP     Component Value Date/Time   NA 141 01/04/2019 1100   NA 140 08/19/2017 1337   K 4.1 01/04/2019 1100   K 3.6 08/19/2017 1337   CL 105 01/04/2019 1100   CO2 26 01/04/2019 1100   CO2 24 08/19/2017 1337   GLUCOSE 76 01/04/2019 1100   GLUCOSE 88 08/19/2017 1337   BUN 12 01/04/2019 1100   BUN 10.9 08/19/2017 1337   CREATININE 0.72 01/04/2019 1100   CREATININE 0.7 08/19/2017 1337   CALCIUM 9.4 01/04/2019 1100   CALCIUM 9.0 08/19/2017 1337   PROT 7.6 01/04/2019 1100   PROT 7.0 06/22/2018 0817   PROT 6.6 08/19/2017 1337   ALBUMIN 4.1 01/04/2019 1100   ALBUMIN 4.6 06/22/2018 0817   ALBUMIN 3.9 08/19/2017 1337   AST 37 01/04/2019 1100   AST 35 (H) 08/19/2017 1337   ALT 34 01/04/2019 1100   ALT 55 08/19/2017 1337   ALKPHOS 46 01/04/2019 1100   ALKPHOS 61 08/19/2017 1337   BILITOT 0.5 01/04/2019 1100   BILITOT 0.41 08/19/2017 1337   GFRNONAA >60 01/04/2019 1100   GFRAA >60 01/04/2019 1100    No results found for: TOTALPROTELP, ALBUMINELP, A1GS, A2GS, BETS, BETA2SER, GAMS, MSPIKE, SPEI  No results found for: KPAFRELGTCHN, LAMBDASER, KAPLAMBRATIO  Lab Results  Component Value Date   WBC 4.4 09/19/2018   NEUTROABS 2.7 09/19/2018   HGB 13.9 09/19/2018   HCT 41.7 09/19/2018   MCV 94.8 09/19/2018   PLT 240 09/19/2018      Chemistry      Component Value Date/Time  NA 141 01/04/2019 1100   NA 140 08/19/2017 1337   K 4.1 01/04/2019 1100   K 3.6 08/19/2017 1337   CL  105 01/04/2019 1100   CO2 26 01/04/2019 1100   CO2 24 08/19/2017 1337   BUN 12 01/04/2019 1100   BUN 10.9 08/19/2017 1337   CREATININE 0.72 01/04/2019 1100   CREATININE 0.7 08/19/2017 1337      Component Value Date/Time   CALCIUM 9.4 01/04/2019 1100   CALCIUM 9.0 08/19/2017 1337   ALKPHOS 46 01/04/2019 1100   ALKPHOS 61 08/19/2017 1337   AST 37 01/04/2019 1100   AST 35 (H) 08/19/2017 1337   ALT 34 01/04/2019 1100   ALT 55 08/19/2017 1337   BILITOT 0.5 01/04/2019 1100   BILITOT 0.41 08/19/2017 1337       No results found for: LABCA2  No components found for: [id number]  No results for input(s): INR in the last 168 hours.  Urinalysis    Component Value Date/Time   COLORURINE YELLOW 08/26/2011 1652   APPEARANCEUR CLEAR 08/26/2011 1652   LABSPEC 1.025 12/21/2013 1226   PHURINE 6.0 12/21/2013 1226   GLUCOSEU NEGATIVE 12/21/2013 1226   HGBUR TRACE (A) 12/21/2013 1226   BILIRUBINUR Negative 05/16/2018 1129   KETONESUR NEGATIVE 12/21/2013 1226   PROTEINUR Negative 05/16/2018 1129   PROTEINUR NEGATIVE 12/21/2013 1226   UROBILINOGEN 0.2 05/16/2018 1129   UROBILINOGEN 0.2 12/21/2013 1226   NITRITE Negative 05/16/2018 1129   NITRITE NEGATIVE 12/21/2013 1226   LEUKOCYTESUR Moderate (2+) (A) 05/16/2018 1129     STUDIES: She underwent a total hysterectomy on 06/29/2018. She is due for a Chest CT in December 2019.  ELIGIBLE FOR AVAILABLE RESEARCH PROTOCOL: no  ASSESSMENT: 43 y.o. [place] woman status post right breast upper outer quadrant biopsy 02/08/2017 for a clinical T2 N0, stage IB invasive ductal carcinoma, grade 2, estrogen and progesterone receptor positive, HER-2 nonamplified, with an MIB-1 of 5%.  (a) biopsy of upper outer quadrant calcifications in the left breast 02/09/2017 were benign  (1) Oncotype DX obtained from the biopsy sample showed a recurrence score of 11 predicting a 10 year risk of recurrence outside the breast of 9% if the patient's only systemic  therapy is tamoxifen for 5 years (node positive report)  (2) bilateral mastectomies with right sentinel lymph node sampling 02/28/2017 showed  (a) on the left, intraductal papilloma, with no evidence of malignancy.  (b) on the right, a pT2 pN1 stage IIA invasive ductal carcinoma, grade 1, with close margins  (c) additional excision of the right nipple areolar area 03/15/2017 found no residual carcinoma  (d) status post bilateral definitive silicone implant placement 07/25/2018  (3) Mammaprint study on the final surgical sample came back low risk, predicting a five-year distant disease-free survival of 97.8% with hormone therapy alone   (3) tamoxifen started neoadjuvantly 02/11/2017, Stopped 04/11/2017 in preparation for chemotherapy  (4) genetics counseling 02/19/2017 showed no deleterious mutations in the STAT gene panel offered by [facility] including ATM, BRCA1, BRCA2, CDH1, CHEK2, PALB2, PTEN, STK11, and TP53.  (a) multi gene panel February 21, 2017 through the  Multi-Gene Panel offered by [facility] found no deleterious mutations in ALK, APC, ATM, AXIN2,BAP1,  BARD1, BLM, BMPR1A, BRCA1, BRCA2, BRIP1, CASR, CDC73, CDH1, CDK4, CDKN1B, CDKN1C, CDKN2A (p14ARF), CDKN2A (p16INK4a), CEBPA, CHEK2, CTNNA1, DICER1, DIS3L2, EGFR (c.2369C>T, p.Thr790Met variant only), EPCAM (Deletion/duplication testing only), FH, FLCN, GATA2, GPC3, GREM1 (Promoter region deletion/duplication testing only), HOXB13 (c.251G>A, p.Gly84Glu), HRAS, KIT, MAX, MEN1, MET, MITF (c.952G>A, p.Glu318Lys variant  only), MLH1, MSH2, MSH3, MSH6, MUTYH, NBN, NF1, NF2, NTHL1, PALB2, PDGFRA, PHOX2B, PMS2, POLD1, POLE, POT1, PRKAR1A, PTCH1, PTEN, RAD50, RAD51C, RAD51D, RB1, RECQL4, RET, RUNX1, SDHAF2, SDHA (sequence changes only), SDHB, SDHC, SDHD, SMAD4, SMARCA4, SMARCB1, SMARCE1, STK11, SUFU, TERT, TERT, TMEM127, TP53, TSC1, TSC2, VHL, WRN and WT1.   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  (9) significant hypercholesterolemia  (10) CT scan for cardiac scoring 03/30/2018 showed clustered nodules in the inferior right upper lobe.  This was felt to be most likely inflammatory.  Repeat CT of the chest on 10/10/2018 shows subpleural nodular scarring in the anterolateral right upper and middle lobes, unchanged from prior, compatible with radiation changes.  No findings suspicious for recurrent or metastatic disease.     PLAN: Katelyn Shelton is doing moderately well.  She is without any clinical or radiographic sign of recurrence.  She is taking the Anastrozole daily.  She does continue to struggle with taking it and making a decision on whether or not to continue on it, and deal with the side effects, or change to Tamoxifen.  She is going to have another opinion with a breast oncologist at [facility] who is a woman to discuss these concerns further.  Katelyn Shelton is having a new lower back pain.  I ordered plain films of her thoracic and lumbar spine to fully evaluate.  I explained that these can be done as a drop in, whenever she has time.  She plans on returning next week for these.    Katelyn Shelton and I reviewed the Effexor and her fatigue.  It may possibly be contributing and she wants to decrease the dosage.  She still has some 80m tablets that are extended release.  I reviewed with her that Effexor is a difficult medication to taper off of.  She will take 731malternating with 37.5 mg tablets daily until she runs out of 75  mg tablets.  Then she will continue on 37.36m46mablets daily.    Her fatigue is likely multi factorial.  She is having difficulty sleeping, has undergone several surgeries, is having a challenging time with menopausal symptoms from the Anastrozole.  I recommended that exercise can help with this, along with her joint aches and pains.  She will receive Prolia today for her osteopenia from bone density testing done in 11/2017.  She is tolerating this well.    She will return in 6 months for labs and f/u.  She knows to call for any questions or concerns prior to her next appointment with us.[place]  A total of (30) minutes of face-to-face time was spent with this patient with greater than 50% of that time in counseling and care-coordination.    LinWilber BihariP  01/04/19 3:47 PM Medical Oncology and Hematology [facility] [id number]. [phone] Fax. [phone]

## 2019-01-04 NOTE — Patient Instructions (Signed)

## 2019-01-04 NOTE — Telephone Encounter (Signed)
Patient decline avs and calendar °

## 2019-01-04 NOTE — Progress Notes (Signed)
Labs not back yet.  I s/w Kasandra Knudsen, LPN by phone and he stated CMET results not in computer but per MD "normal and ok for tx (w/ Prolia)." Kennith [place], Pharm.D., CPP 01/04/2019@11 :14 AM

## 2019-01-06 ENCOUNTER — Ambulatory Visit (HOSPITAL_COMMUNITY)
Admission: RE | Admit: 2019-01-06 | Discharge: 2019-01-06 | Disposition: A | Discharge status: Home / Self Care | Payer: 59 | Source: Ambulatory Visit | Attending: Adult Health | Admitting: Adult Health

## 2019-01-06 ENCOUNTER — Encounter (HOSPITAL_COMMUNITY): Payer: Self-pay

## 2019-01-06 ENCOUNTER — Other Ambulatory Visit: Payer: Self-pay

## 2019-01-06 ENCOUNTER — Emergency Department (HOSPITAL_COMMUNITY): Admission: EM | Admit: 2019-01-06 | Payer: 59 | Source: Home / Self Care

## 2019-01-06 DIAGNOSIS — M549 Dorsalgia, unspecified: Secondary | ICD-10-CM | POA: Insufficient documentation

## 2019-01-06 DIAGNOSIS — Z5321 Procedure and treatment not carried out due to patient leaving prior to being seen by health care provider: Secondary | ICD-10-CM | POA: Insufficient documentation

## 2019-01-06 DIAGNOSIS — Z170 Estrogen receptor positive status [ER+]: Secondary | ICD-10-CM | POA: Insufficient documentation

## 2019-01-06 DIAGNOSIS — M545 Low back pain, unspecified: Secondary | ICD-10-CM

## 2019-01-06 DIAGNOSIS — M546 Pain in thoracic spine: Secondary | ICD-10-CM | POA: Diagnosis not present

## 2019-01-06 DIAGNOSIS — C50411 Malignant neoplasm of upper-outer quadrant of right female breast: Secondary | ICD-10-CM | POA: Insufficient documentation

## 2019-01-06 DIAGNOSIS — M5136 Other intervertebral disc degeneration, lumbar region: Secondary | ICD-10-CM | POA: Diagnosis not present

## 2019-01-06 NOTE — ED Triage Notes (Signed)
Pt presents with c/o back pain that started 2-3 weeks ago. Pt reports a hx of breast cancer in 2018 and was told to get checked up when she recently reported back pain at her annual scan. Pt also recently traveled to [place] but no contact with anyone that has been sick.

## 2019-01-08 ENCOUNTER — Telehealth: Payer: Self-pay

## 2019-01-08 NOTE — Telephone Encounter (Signed)
-----   Message from Gardenia Phlegm, NP sent at 01/08/2019  3:15 PM EDT ----- Xrays normal, please let patient know.   ----- Message ----- From: Interface, Rad Results In Sent: 01/06/2019   3:00 PM EDT To: Gardenia Phlegm, NP

## 2019-01-08 NOTE — Telephone Encounter (Signed)
LVM for patient to call back to center to give x ray results.

## 2019-01-09 MED FILL — REPATHA SURECLICK 140 MG/ML: 140 | 28 days supply | Qty: 2 | Fill #8 | Status: TO

## 2019-01-10 ENCOUNTER — Ambulatory Visit: Payer: Self-pay | Admitting: Licensed Clinical Social Worker

## 2019-01-11 ENCOUNTER — Other Ambulatory Visit: Payer: Self-pay

## 2019-01-11 ENCOUNTER — Ambulatory Visit: Payer: Self-pay | Admitting: Oncology

## 2019-02-07 MED FILL — VENLAFAXINE HCL ER 37.5 MG: 37.5 | 30 days supply | Qty: 30 | Fill #0

## 2019-02-07 MED FILL — REPATHA SURECLICK 140 MG/ML: 140 | 28 days supply | Qty: 2 | Fill #0

## 2019-02-22 ENCOUNTER — Telehealth: Payer: Self-pay | Admitting: *Deleted

## 2019-02-22 NOTE — Telephone Encounter (Signed)
Medical records faxed to [facility] ; [place] [phone]

## 2019-03-06 MED FILL — REPATHA SURECLICK 140 MG/ML: 140 | 28 days supply | Qty: 2 | Fill #1 | Status: TO

## 2019-03-07 DIAGNOSIS — C50911 Malignant neoplasm of unspecified site of right female breast: Secondary | ICD-10-CM | POA: Diagnosis not present

## 2019-03-13 MED FILL — VENLAFAXINE HCL ER 37.5 MG: 37.5 | 30 days supply | Qty: 30 | Fill #1

## 2019-04-02 ENCOUNTER — Telehealth: Payer: Self-pay | Admitting: Pharmacist

## 2019-04-02 NOTE — Telephone Encounter (Signed)
Left VM for patient to return call. Calling patient to let her know that PA for Rep[place] has been approved through 03/29/2020. Is she needs a new copay card-can get one from [email or web address]

## 2019-04-03 MED ORDER — EVOLOCUMAB 140 MG/ML ~~LOC~~ SOAJ: 1.0000 "pen " | SUBCUTANEOUS | 11 refills | Status: DC

## 2019-04-03 MED FILL — REPATHA SURECLICK 140 MG/ML: 140 | 28 days supply | Qty: 2 | Fill #0

## 2019-04-03 MED FILL — ANASTROZOLE 1 MG TABLET: 1 | 90 days supply | Qty: 90 | Fill #1

## 2019-04-03 NOTE — Addendum Note (Signed)
Addended by: SUPPLE, MEGAN E on: 04/03/2019 08:38 AM   Modules accepted: Orders

## 2019-04-03 NOTE — Telephone Encounter (Addendum)
Confirmed with pharmacy that rx is being processed for $5 copay since new PA has been approved. Left message for pt to make her aware.

## 2019-04-05 DIAGNOSIS — Z923 Personal history of irradiation: Secondary | ICD-10-CM | POA: Diagnosis not present

## 2019-04-05 DIAGNOSIS — Z9013 Acquired absence of bilateral breasts and nipples: Secondary | ICD-10-CM | POA: Diagnosis not present

## 2019-04-05 DIAGNOSIS — Z853 Personal history of malignant neoplasm of breast: Secondary | ICD-10-CM | POA: Diagnosis not present

## 2019-04-16 ENCOUNTER — Telehealth: Payer: Self-pay | Admitting: Family Medicine

## 2019-04-16 NOTE — Telephone Encounter (Signed)
Pt calling to request rapid test for covid 19 for trip out of state in July. Needs to be tested,resulted and sent within 72  Hours to rental .  Advised to call [facility] for further advise as testing is time sensitive.

## 2019-04-23 MED FILL — VENLAFAXINE HCL ER 37.5 MG: 37.5 | 30 days supply | Qty: 30 | Fill #0

## 2019-05-09 MED FILL — REPATHA SURECLICK 140 MG/ML: 140 | 28 days supply | Qty: 2 | Fill #0

## 2019-05-10 DIAGNOSIS — Z20828 Contact with and (suspected) exposure to other viral communicable diseases: Secondary | ICD-10-CM | POA: Diagnosis not present

## 2019-05-22 DIAGNOSIS — M79671 Pain in right foot: Secondary | ICD-10-CM | POA: Diagnosis not present

## 2019-05-25 MED FILL — VENLAFAXINE HCL ER 37.5 MG: 37.5 | 30 days supply | Qty: 30 | Fill #1

## 2019-06-12 ENCOUNTER — Telehealth: Payer: Self-pay | Admitting: *Deleted

## 2019-06-12 NOTE — Telephone Encounter (Signed)
Pt left vm stating had some pain in between her shoulder blades on her spine. Called pt back to further discuss symptoms. Contact information provided for return call.

## 2019-06-18 MED FILL — REPATHA SURECLICK 140 MG/ML: 140 | 28 days supply | Qty: 2 | Fill #1

## 2019-06-21 ENCOUNTER — Ambulatory Visit (INDEPENDENT_AMBULATORY_CARE_PROVIDER_SITE_OTHER): Payer: 59 | Admitting: Family Medicine

## 2019-06-21 ENCOUNTER — Encounter: Payer: Self-pay | Admitting: Family Medicine

## 2019-06-21 ENCOUNTER — Other Ambulatory Visit: Payer: Self-pay

## 2019-06-21 VITALS — BP 100/78 | HR 67 | Temp 98.2°F | Ht 60.0 in | Wt 139.4 lb

## 2019-06-21 DIAGNOSIS — E663 Overweight: Secondary | ICD-10-CM

## 2019-06-21 DIAGNOSIS — Z79899 Other long term (current) drug therapy: Secondary | ICD-10-CM | POA: Diagnosis not present

## 2019-06-21 DIAGNOSIS — M8580 Other specified disorders of bone density and structure, unspecified site: Secondary | ICD-10-CM

## 2019-06-21 DIAGNOSIS — M859 Disorder of bone density and structure, unspecified: Secondary | ICD-10-CM

## 2019-06-21 DIAGNOSIS — M2550 Pain in unspecified joint: Secondary | ICD-10-CM

## 2019-06-21 DIAGNOSIS — E785 Hyperlipidemia, unspecified: Secondary | ICD-10-CM

## 2019-06-21 DIAGNOSIS — Z23 Encounter for immunization: Secondary | ICD-10-CM | POA: Diagnosis not present

## 2019-06-21 LAB — SEDIMENTATION RATE: Sed Rate: 8 mm/hr (ref 0–20)

## 2019-06-21 LAB — TSH: TSH: 0.85 u[IU]/mL (ref 0.35–4.50)

## 2019-06-21 LAB — MAGNESIUM: Magnesium: 1.8 mg/dL (ref 1.5–2.5)

## 2019-06-21 LAB — VITAMIN D 25 HYDROXY (VIT D DEFICIENCY, FRACTURES): VITD: 54.98 ng/mL (ref 30.00–100.00)

## 2019-06-21 LAB — C-REACTIVE PROTEIN: CRP: <1 mg/dL (ref 0.5–20.0)

## 2019-06-21 NOTE — Patient Instructions (Addendum)
Health Maintenance Due  Topic Date Due  . INFLUENZA VACCINE -today 05/26/2019   [email or web address] Katelyn Malady, MD  I like your idea of starting with therapist to help with mental strain of chronic pain  Please stop by lab before you go If you do not have mychart- we will call you about results within 5 business days of [place] receiving them.  If you have mychart- we will send your results within 3 business days of [place] receiving them.  If abnormal or we want to clarify a result, we will call or mychart you to make sure you receive the message.  If you have questions or concerns or don't hear within 5-7 days, please send [place] a message or call us.

## 2019-06-21 NOTE — Progress Notes (Signed)
Phone [phone]   Subjective:  Katelyn Shelton is a 43 y.o. year old very pleasant female patient who presents for/with See problem oriented charting Chief Complaint  Patient presents with  . Medication review   ROS-complains of arthralgia and "bony pain".  No fever/chills/nausea/vomiting.  Past Medical History-  Patient Active Problem List   Diagnosis Date Noted  . Malignant neoplasm of upper-outer quadrant of right breast in female, estrogen receptor positive (HCC) 02/11/2017    Priority: High  . Hyperlipidemia, familial, high LDL 01/16/2010    Priority: High  . Arthralgia 02/24/2018    Priority: Medium  . Enchondroma of bone 02/24/2018    Priority: Medium  . Anxiety     Priority: Medium  . Hidradenitis suppurativa 08/26/2011    Priority: Medium  . Family history of colonic polyps 02/24/2018    Priority: Low  . Genetic testing 02/21/2017    Priority: Low  . Family history of stomach cancer     Priority: Low  . S/P laparoscopic assisted vaginal hysterectomy (LAVH) 06/29/2018  . Lesion of lung 04/05/2018  . Abnormal CT scan of lung 04/05/2018  . History of cancer 04/05/2018    Medications- reviewed and updated Current Outpatient Medications  Medication Sig Dispense Refill  . acetaminophen (TYLENOL) 325 MG tablet Take 650 mg by mouth every 6 (six) hours as needed.    . anastrozole (ARIMIDEX) 1 MG tablet TAKE 1 TABLET BY MOUTH DAILY 90 tablet 4  . Calcium Carbonate (CALCIUM 600 PO) Take 1 capsule by mouth daily.     . denosumab (PROLIA) 60 MG/ML SOSY injection Inject 60 mg into the skin every 6 (six) months.    . Evolocumab (REPATHA SURECLICK) 140 MG/ML SOAJ Inject 1 pen into the skin every 14 (fourteen) days. 2 pen 11  . Multiple Vitamins-Minerals (MULTIVITAMIN WOMEN 50+) TABS Take 1 tablet by mouth daily.     . venlafaxine XR (EFFEXOR-XR) 37.5 MG 24 hr capsule Take 1 capsule (37.5 mg total) by mouth daily with breakfast. 30 capsule 5   No current  facility-administered medications for this visit.      Objective:  BP 100/78 (BP Location: Left Arm, Patient Position: Sitting, Cuff Size: Normal)   Pulse 67   Temp 98.2 F (36.8 C) (Oral)   Ht 5' (1.524 m)   Wt 139 lb 6.4 oz (63.2 kg)   LMP 09/01/2017   SpO2 98%   BMI 27.22 kg/m  Gen: NAD, resting comfortably  CV: RRR  Lungs: nonlabored, normal respiratory rate Abdomen: soft/nondistended      Assessment and Plan   #Concern about arthralgias/medication Side Effects Review S:Would like to discuss side effects of Anastrozole and Repatha.  Patient complains of "bony pain".  Pain can be rather disabling.  Morning stiffness- feels stiff in feet, hip discomfort, occasional knees, occasional shoulder blades. Feels pretty functional 10 AM-4 PM. Slow jog 2 miles a day actually seems to help. She feels like stress increases pain levels. Less pain on vacation- not sure if that was related to lower stress or more exercise.No joint swelling   Patient has seen a therapist in the past- she is willing to return.  She is also seen nutritionist in the past- the differences in opinions can be difficult/challenging for patient.  Patient wonders about seeing a more holistic/wellness doctor like [facility].  Patient also wishes she had access to a gym as she finds that beneficial.  Patient has been on 18 months of anastrazole.  Patient had muscle   pain on tamoxifen in the past-she recently had a second opinion from [facility] about swapping therapy-ultimately she decided against this due to 4% increased risk of breast cancer recurrence.  Cycled on many statins but myalgias or bone pain on them when tried.She has been on at least 13 months of Repatha.  She has a friend who is a pharmacist and they discussed concerns including magnesium, vitamin D, thyroid and updating labs.  Patient also A/P: Extended conversation/counseling today.  I thought seeing [facility] was reasonable- I gave  her the website and she can call.  I am happy to place referral if needed but I do not believe they require one.  She is going to consider a visit with Dr. Saunders.  I also thought updating blood work was reasonable- ESR and CRP were normal.  Magnesium and TSH were normal.  Vitamin D was excellent.  With morning stiffness-also chose to get an ANA and rheumatoid factor though no joint swelling reported and less likely rheumatoid arthritis related.   #  overweight S: Patient states weight trending up some- trying to exercise and eat better Wt Readings from Last 3 Encounters:  06/21/19 139 lb 6.4 oz (63.2 kg)  01/04/19 135 lb 6.4 oz (61.4 kg)  12/01/18 134 lb 11.2 oz (61.1 kg)  A/P: Slight weight gain-Encouraged need for healthy eating, regular exercise, weight loss.   Recommended follow up: As needed for acute concerns.  Would be reasonable to schedule physical at some point Future Appointments  Date Time Provider Department Center  07/09/2019  8:30 AM [facility] LAB 2 [facility] None  07/09/2019  9:00 AM Causey, Lindsey Cornetto, NP [facility] None  07/09/2019  9:45 AM [facility] FLUSH [facility] None    Lab/Order associations:   ICD-10-CM   1. Arthralgia, unspecified joint  M25.50 VITAMIN D 25 Hydroxy (Vit-D Deficiency, Fractures)    Magnesium    Antinuclear Antib (ANA)    Rheumatoid Factor    Sedimentation rate    C-reactive protein  2. Hyperlipidemia, unspecified hyperlipidemia type  E78.5 TSH  3. High risk medication use  Z79.899   4. Low bone density  M85.80 VITAMIN D 25 Hydroxy (Vit-D Deficiency, Fractures)  5. Need for influenza vaccination  Z23 Flu Vaccine QUAD 6+ mos PF IM (Fluarix Quad PF)  6. Overweight  E66.3      Time Stamp The duration of face-to-face time during this visit was greater than 25 minutes. Greater than 50% of this time was spent in counseling, explanation of diagnosis, planning of further management, and/or coordination of care including discussing  frustrations with chronic pain, discussing potential work-up for flank pain/arthralgias, discussing potential causes of symptoms, discussing alternative therapies, encouraging counseling follow-up to help deal with stress of chronic pain.    Return precautions advised.   , MD  

## 2019-06-23 LAB — ANTI-NUCLEAR AB-TITER (ANA TITER): ANA Titer 1: 1:40 {titer} — ABNORMAL HIGH

## 2019-06-23 LAB — ANA: Anti Nuclear Antibody (ANA): POSITIVE — AB

## 2019-06-23 LAB — RHEUMATOID FACTOR: Rhuematoid fact SerPl-aCnc: <14 IU/mL (ref <14)

## 2019-06-25 ENCOUNTER — Other Ambulatory Visit: Payer: Self-pay | Admitting: Adult Health

## 2019-06-25 DIAGNOSIS — M545 Low back pain, unspecified: Secondary | ICD-10-CM

## 2019-06-25 DIAGNOSIS — C50411 Malignant neoplasm of upper-outer quadrant of right female breast: Secondary | ICD-10-CM

## 2019-06-25 DIAGNOSIS — Z170 Estrogen receptor positive status [ER+]: Secondary | ICD-10-CM

## 2019-06-25 MED FILL — VENLAFAXINE HCL ER 37.5 MG: 37.5 | 30 days supply | Qty: 30 | Fill #2

## 2019-06-28 ENCOUNTER — Telehealth: Payer: Self-pay | Admitting: Physical Therapy

## 2019-06-28 NOTE — Addendum Note (Signed)
Addended by: Gwenyth Ober R on: 06/28/2019 11:54 AM   Modules accepted: Orders

## 2019-06-28 NOTE — Telephone Encounter (Signed)
Copied from [place] [id number]. Topic: General - Call Back - No Documentation >> Jun 28, 2019 10:41 AM Erick Blinks wrote: Reason for CRM: Pt called requesting call back from PCP's nurse. Please advise, "requesting referral for Rheumatology to be with Dr. Leigh Aurora"

## 2019-06-28 NOTE — Addendum Note (Signed)
Addended by: Gwenyth Ober R on: 06/28/2019 11:21 AM   Modules accepted: Orders

## 2019-06-28 NOTE — Telephone Encounter (Signed)
Tried to call pt multiple times. Referral has been updated to add Dr. Amil Amen if appropriate.

## 2019-06-29 ENCOUNTER — Other Ambulatory Visit: Payer: Self-pay | Admitting: Family Medicine

## 2019-06-29 DIAGNOSIS — M2550 Pain in unspecified joint: Secondary | ICD-10-CM

## 2019-06-29 NOTE — Telephone Encounter (Signed)
I'll put a new referral in and get it sent.  Thanks.

## 2019-06-29 NOTE — Telephone Encounter (Signed)
Patient returning a call in regards to referral. Please advise.

## 2019-06-29 NOTE — Telephone Encounter (Signed)
Called pt and advised. Please make sure referral has been sent to Dr. Valora Piccolo office. Thanks!

## 2019-07-05 ENCOUNTER — Encounter: Payer: Self-pay | Admitting: Family Medicine

## 2019-07-05 DIAGNOSIS — M2550 Pain in unspecified joint: Secondary | ICD-10-CM | POA: Diagnosis not present

## 2019-07-05 DIAGNOSIS — Z6826 Body mass index (BMI) 26.0-26.9, adult: Secondary | ICD-10-CM | POA: Diagnosis not present

## 2019-07-05 DIAGNOSIS — R768 Other specified abnormal immunological findings in serum: Secondary | ICD-10-CM | POA: Diagnosis not present

## 2019-07-05 DIAGNOSIS — E663 Overweight: Secondary | ICD-10-CM | POA: Diagnosis not present

## 2019-07-06 ENCOUNTER — Other Ambulatory Visit: Payer: Self-pay | Admitting: Adult Health

## 2019-07-06 DIAGNOSIS — Z170 Estrogen receptor positive status [ER+]: Secondary | ICD-10-CM

## 2019-07-06 DIAGNOSIS — C50411 Malignant neoplasm of upper-outer quadrant of right female breast: Secondary | ICD-10-CM

## 2019-07-09 ENCOUNTER — Inpatient Hospital Stay: Payer: 59 | Attending: Oncology

## 2019-07-09 ENCOUNTER — Inpatient Hospital Stay: Payer: 59

## 2019-07-09 ENCOUNTER — Other Ambulatory Visit: Payer: Self-pay

## 2019-07-09 ENCOUNTER — Encounter: Payer: Self-pay | Admitting: Adult Health

## 2019-07-09 ENCOUNTER — Inpatient Hospital Stay (HOSPITAL_BASED_OUTPATIENT_CLINIC_OR_DEPARTMENT_OTHER): Payer: 59 | Admitting: Adult Health

## 2019-07-09 VITALS — BP 115/75 | HR 87 | Temp 98.2°F | Resp 18 | Ht 60.0 in | Wt 136.0 lb

## 2019-07-09 DIAGNOSIS — E785 Hyperlipidemia, unspecified: Secondary | ICD-10-CM | POA: Diagnosis not present

## 2019-07-09 DIAGNOSIS — Z9013 Acquired absence of bilateral breasts and nipples: Secondary | ICD-10-CM | POA: Diagnosis not present

## 2019-07-09 DIAGNOSIS — M8580 Other specified disorders of bone density and structure, unspecified site: Secondary | ICD-10-CM | POA: Diagnosis not present

## 2019-07-09 DIAGNOSIS — M2550 Pain in unspecified joint: Secondary | ICD-10-CM | POA: Diagnosis not present

## 2019-07-09 DIAGNOSIS — Z79811 Long term (current) use of aromatase inhibitors: Secondary | ICD-10-CM | POA: Insufficient documentation

## 2019-07-09 DIAGNOSIS — Z170 Estrogen receptor positive status [ER+]: Secondary | ICD-10-CM

## 2019-07-09 DIAGNOSIS — Z8614 Personal history of Methicillin resistant Staphylococcus aureus infection: Secondary | ICD-10-CM | POA: Diagnosis not present

## 2019-07-09 DIAGNOSIS — Z9221 Personal history of antineoplastic chemotherapy: Secondary | ICD-10-CM | POA: Diagnosis not present

## 2019-07-09 DIAGNOSIS — Z79899 Other long term (current) drug therapy: Secondary | ICD-10-CM | POA: Diagnosis not present

## 2019-07-09 DIAGNOSIS — F419 Anxiety disorder, unspecified: Secondary | ICD-10-CM | POA: Insufficient documentation

## 2019-07-09 DIAGNOSIS — Z923 Personal history of irradiation: Secondary | ICD-10-CM | POA: Insufficient documentation

## 2019-07-09 DIAGNOSIS — C50411 Malignant neoplasm of upper-outer quadrant of right female breast: Secondary | ICD-10-CM | POA: Diagnosis not present

## 2019-07-09 DIAGNOSIS — E7800 Pure hypercholesterolemia, unspecified: Secondary | ICD-10-CM | POA: Diagnosis not present

## 2019-07-09 LAB — CMP (CANCER CENTER ONLY)
ALT: 32 U/L (ref 0–44)
AST: 29 U/L (ref 15–41)
Albumin: 4.6 g/dL (ref 3.5–5.0)
Alkaline Phosphatase: 46 U/L (ref 38–126)
Anion gap: 9 (ref 5–15)
BUN: 12 mg/dL (ref 6–20)
CO2: 26 mmol/L (ref 22–32)
Calcium: 9.4 mg/dL (ref 8.9–10.3)
Chloride: 105 mmol/L (ref 98–111)
Creatinine: 0.75 mg/dL (ref 0.44–1.00)
GFR, Est AFR Am: >60 mL/min (ref >60)
GFR, Estimated: >60 mL/min (ref >60)
Glucose, Bld: 86 mg/dL (ref 70–99)
Potassium: 4.4 mmol/L (ref 3.5–5.1)
Sodium: 140 mmol/L (ref 135–145)
Total Bilirubin: 0.4 mg/dL (ref 0.3–1.2)
Total Protein: 7.6 g/dL (ref 6.5–8.1)

## 2019-07-09 LAB — CBC WITH DIFFERENTIAL (CANCER CENTER ONLY)
Abs Immature Granulocytes: 0.02 10*3/uL (ref 0.00–0.07)
Basophils Absolute: 0.1 10*3/uL (ref 0.0–0.1)
Basophils Relative: 1 %
Eosinophils Absolute: 0.1 10*3/uL (ref 0.0–0.5)
Eosinophils Relative: 2 %
HCT: 43 % (ref 36.0–46.0)
Hemoglobin: 14.6 g/dL (ref 12.0–15.0)
Immature Granulocytes: 0 %
Lymphocytes Relative: 28 %
Lymphs Abs: 1.3 10*3/uL (ref 0.7–4.0)
MCH: 32.4 pg (ref 26.0–34.0)
MCHC: 34 g/dL (ref 30.0–36.0)
MCV: 95.6 fL (ref 80.0–100.0)
Monocytes Absolute: 0.5 10*3/uL (ref 0.1–1.0)
Monocytes Relative: 10 %
Neutro Abs: 2.7 10*3/uL (ref 1.7–7.7)
Neutrophils Relative %: 59 %
Platelet Count: 211 10*3/uL (ref 150–400)
RBC: 4.5 MIL/uL (ref 3.87–5.11)
RDW: 12.1 % (ref 11.5–15.5)
WBC Count: 4.6 10*3/uL (ref 4.0–10.5)
nRBC: 0 % (ref 0.0–0.2)

## 2019-07-09 MED ORDER — DENOSUMAB 60 MG/ML ~~LOC~~ SOSY
PREFILLED_SYRINGE | SUBCUTANEOUS | Status: AC
  Filled 2019-07-09: qty 1

## 2019-07-09 MED ORDER — EXEMESTANE 25 MG PO TABS: 25.0000 mg | ORAL_TABLET | Freq: Every day | ORAL | 0 refills | Status: DC

## 2019-07-09 MED ORDER — DENOSUMAB 60 MG/ML ~~LOC~~ SOLN
60.0000 mg | Freq: Once | SUBCUTANEOUS | Status: AC
  Administered 2019-07-09: 60 mg via SUBCUTANEOUS

## 2019-07-09 MED FILL — EXEMESTANE 25 MG TABLET: 25 | 30 days supply | Qty: 30 | Fill #0

## 2019-07-09 NOTE — Patient Instructions (Addendum)
Exemestane tablets What is this medicine? EXEMESTANE (ex e MES tane) blocks the production of the hormone estrogen. Some types of breast cancer depend on estrogen to grow, and this medicine can stop tumor growth by blocking estrogen production. This medicine is for the treatment of breast cancer in postmenopausal women only. This medicine may be used for other purposes; ask your health care provider or pharmacist if you have questions. COMMON BRAND NAME(S): Aromasin What should I tell my health care provider before I take this medicine? They need to know if you have any of these conditions:  an unusual or allergic reaction to exemestane, other medicines, foods, dyes, or preservatives  pregnant or trying to get pregnant  breast-feeding How should I use this medicine? Take this medicine by mouth with a glass of water. Follow the directions on the prescription label. Take your doses at regular intervals after a meal. Do not take your medicine more often than directed. Do not stop taking except on the advice of your doctor or health care professional. Contact your pediatrician regarding the use of this medicine in children. Special care may be needed. Overdosage: If you think you have taken too much of this medicine contact a poison control center or emergency room at once. NOTE: This medicine is only for you. Do not share this medicine with others. What if I miss a dose? If you miss a dose, take the next dose as usual. Do not try to make up the missed dose. Do not take double or extra doses. What may interact with this medicine?  certain medicines for seizures like carbamazepine, phenobarbital, phenytoin  rifampin  St. John's Wort This list may not describe all possible interactions. Give your health care provider a list of all the medicines, herbs, non-prescription drugs, or dietary supplements you use. Also tell them if you smoke, drink alcohol, or use illegal drugs. Some items may interact  with your medicine. What should I watch for while using this medicine? Visit your doctor or health care professional for regular checks on your progress. If you experience hot flashes or sweating while taking this medicine, avoid alcohol, smoking and drinks with caffeine. This may help to decrease these side effects. Do not become pregnant while taking this medicine or for 1 month after stopping it. Women should inform their doctor if they wish to become pregnant or think they might be pregnant. Women of child-bearing potential will need to have a negative pregnancy test before starting this medicine. There is a potential for serious side effects to an unborn child. Do not breast-feed an infant while taking this medicine or for 1 month after stopping it. Talk to your health care professional or pharmacist for more information. What side effects may I notice from receiving this medicine? Side effects that you should report to your doctor or health care professional as soon as possible:  any new or unusual symptoms  changes in vision  fever  leg or arm swelling  pain in bones, joints, or muscles  pain in hips, back, ribs, arms, shoulders, or legs Side effects that usually do not require medical attention (report to your doctor or health care professional if they continue or are bothersome):  difficulty sleeping  headache  hot flashes  sweating  unusually weak or tired This list may not describe all possible side effects. Call your doctor for medical advice about side effects. You may report side effects to FDA at 1-800-FDA-1088. Where should I keep my medicine? Keep out  of the reach of children. Store at room temperature between 15 and 30 degrees C (59 and 86 degrees F). Throw away any unused medicine after the expiration date. NOTE: This sheet is a summary. It may not cover all possible information. If you have questions about this medicine, talk to your doctor, pharmacist, or health  care provider.  2020 Elsevier/Gold Standard (2017-03-30 08:39:27)  Bone Health Bones protect organs, store calcium, anchor muscles, and support the whole body. Keeping your bones strong is important, especially as you get older. You can take actions to help keep your bones strong and healthy. Why is keeping my bones healthy important?  Keeping your bones healthy is important because your body constantly replaces bone cells. Cells get old, and new cells take their place. As we age, we lose bone cells because the body may not be able to make enough new cells to replace the old cells. The amount of bone cells and bone tissue you have is referred to as bone mass. The higher your bone mass, the stronger your bones. The aging process leads to an overall loss of bone mass in the body, which can increase the likelihood of:  Joint pain and stiffness.  Broken bones.  A condition in which the bones become weak and brittle (osteoporosis). A large decline in bone mass occurs in older adults. In women, it occurs about the time of menopause. What actions can I take to keep my bones healthy? Good health habits are important for maintaining healthy bones. This includes eating nutritious foods and exercising regularly. To have healthy bones, you need to get enough of the right minerals and vitamins. Most nutrition experts recommend getting these nutrients from the foods that you eat. In some cases, taking supplements may also be recommended. Doing certain types of exercise is also important for bone health. What are the nutritional recommendations for healthy bones?  Eating a well-balanced diet with plenty of calcium and vitamin D will help to protect your bones. Nutritional recommendations vary from person to person. Ask your health care provider what is healthy for you. Here are some general guidelines. Get enough calcium Calcium is the most important (essential) mineral for bone health. Most people can get  enough calcium from their diet, but supplements may be recommended for people who are at risk for osteoporosis. Good sources of calcium include:  Dairy products, such as low-fat or nonfat milk, cheese, and yogurt.  Dark green leafy vegetables, such as bok choy and broccoli.  Calcium-fortified foods, such as orange juice, cereal, bread, soy beverages, and tofu products.  Nuts, such as almonds. Follow these recommended amounts for daily calcium intake:  Children, age 58-3: 700 mg.  Children, age 52-8: 1,000 mg.  Children, age 20-13: 1,300 mg.  Teens, age 51-18: 1,300 mg.  Adults, age 29-50: 1,000 mg.  Adults, age 20-70: ? Men: 1,000 mg. ? Women: 1,200 mg.  Adults, age 96 or older: 1,200 mg.  Pregnant and breastfeeding females: ? Teens: 1,300 mg. ? Adults: 1,000 mg. Get enough vitamin D Vitamin D is the most essential vitamin for bone health. It helps the body absorb calcium. Sunlight stimulates the skin to make vitamin D, so be sure to get enough sunlight. If you live in a cold climate or you do not get outside often, your health care provider may recommend that you take vitamin D supplements. Good sources of vitamin D in your diet include:  Egg yolks.  Saltwater fish.  Milk and cereal fortified with  vitamin D. Follow these recommended amounts for daily vitamin D intake:  Children and teens, age 37-18: 600 international units.  Adults, age 70 or younger: 400-800 international units.  Adults, age 58 or older: 800-1,000 international units. Get other important nutrients Other nutrients that are important for bone health include:  Phosphorus. This mineral is found in meat, poultry, dairy foods, nuts, and legumes. The recommended daily intake for adult men and adult women is 700 mg.  Magnesium. This mineral is found in seeds, nuts, dark green vegetables, and legumes. The recommended daily intake for adult men is 400-420 mg. For adult women, it is 310-320 mg.  Vitamin K. This  vitamin is found in green leafy vegetables. The recommended daily intake is 120 mg for adult men and 90 mg for adult women. What type of physical activity is best for building and maintaining healthy bones? Weight-bearing and strength-building activities are important for building and maintaining healthy bones. Weight-bearing activities cause muscles and bones to work against gravity. Strength-building activities increase the strength of the muscles that support bones. Weight-bearing and muscle-building activities include:  Walking and hiking.  Jogging and running.  Dancing.  Gym exercises.  Lifting weights.  Tennis and racquetball.  Climbing stairs.  Aerobics. Adults should get at least 30 minutes of moderate physical activity on most days. Children should get at least 60 minutes of moderate physical activity on most days. Ask your health care provider what type of exercise is best for you. How can I find out if my bone mass is low? Bone mass can be measured with an X-ray test called a bone mineral density (BMD) test. This test is recommended for all women who are age 5 or older. It may also be recommended for:  Men who are age 65 or older.  People who are at risk for osteoporosis because of: ? Having bones that break easily. ? Having a long-term disease that weakens bones, such as kidney disease or rheumatoid arthritis. ? Having menopause earlier than normal. ? Taking medicine that weakens bones, such as steroids, thyroid hormones, or hormone treatment for breast cancer or prostate cancer. ? Smoking. ? Drinking three or more alcoholic drinks a day. If you find that you have a low bone mass, you may be able to prevent osteoporosis or further bone loss by changing your diet and lifestyle. Where can I find more information? For more information, check out the following websites:  [place]: AviationTales.fr  [employer] of Health:  www.bones.SouthExposed.es  International Osteoporosis Foundation: Administrator.iofbonehealth.org Summary  The aging process leads to an overall loss of bone mass in the body, which can increase the likelihood of broken bones and osteoporosis.  Eating a well-balanced diet with plenty of calcium and vitamin D will help to protect your bones.  Weight-bearing and strength-building activities are also important for building and maintaining strong bones.  Bone mass can be measured with an X-ray test called a bone mineral density (BMD) test. This information is not intended to replace advice given to you by your health care provider. Make sure you discuss any questions you have with your health care provider. Document Released: 01/01/2004 Document Revised: 11/07/2017 Document Reviewed: 11/07/2017 Elsevier Patient Education  [place].  Denosumab injection What is this medicine? DENOSUMAB (den oh sue mab) slows bone breakdown. Prolia is used to treat osteoporosis in women after menopause and in men, and in people who are taking corticosteroids for 6 months or more. Delton See is used to treat a  high calcium level due to cancer and to prevent bone fractures and other bone problems caused by multiple myeloma or cancer bone metastases. Delton See is also used to treat giant cell tumor of the bone. This medicine may be used for other purposes; ask your health care provider or pharmacist if you have questions. COMMON BRAND NAME(S): Prolia, XGEVA What should I tell my health care provider before I take this medicine? They need to know if you have any of these conditions:  dental disease  having surgery or tooth extraction  infection  kidney disease  low levels of calcium or Vitamin D in the blood  malnutrition  on hemodialysis  skin conditions or sensitivity  thyroid or parathyroid disease  an unusual reaction to denosumab, other medicines, foods, dyes, or preservatives  pregnant or trying to get  pregnant  breast-feeding How should I use this medicine? This medicine is for injection under the skin. It is given by a health care professional in a hospital or clinic setting. A special MedGuide will be given to you before each treatment. Be sure to read this information carefully each time. For Prolia, talk to your pediatrician regarding the use of this medicine in children. Special care may be needed. For Delton See, talk to your pediatrician regarding the use of this medicine in children. While this drug may be prescribed for children as young as 13 years for selected conditions, precautions do apply. Overdosage: If you think you have taken too much of this medicine contact a poison control center or emergency room at once. NOTE: This medicine is only for you. Do not share this medicine with others. What if I miss a dose? It is important not to miss your dose. Call your doctor or health care professional if you are unable to keep an appointment. What may interact with this medicine? Do not take this medicine with any of the following medications:  other medicines containing denosumab This medicine may also interact with the following medications:  medicines that lower your chance of fighting infection  steroid medicines like prednisone or cortisone This list may not describe all possible interactions. Give your health care provider a list of all the medicines, herbs, non-prescription drugs, or dietary supplements you use. Also tell them if you smoke, drink alcohol, or use illegal drugs. Some items may interact with your medicine. What should I watch for while using this medicine? Visit your doctor or health care professional for regular checks on your progress. Your doctor or health care professional may order blood tests and other tests to see how you are doing. Call your doctor or health care professional for advice if you get a fever, chills or sore throat, or other symptoms of a cold or  flu. Do not treat yourself. This drug may decrease your body's ability to fight infection. Try to avoid being around people who are sick. You should make sure you get enough calcium and vitamin D while you are taking this medicine, unless your doctor tells you not to. Discuss the foods you eat and the vitamins you take with your health care professional. See your dentist regularly. Brush and floss your teeth as directed. Before you have any dental work done, tell your dentist you are receiving this medicine. Do not become pregnant while taking this medicine or for 5 months after stopping it. Talk with your doctor or health care professional about your birth control options while taking this medicine. Women should inform their doctor if they wish to  become pregnant or think they might be pregnant. There is a potential for serious side effects to an unborn child. Talk to your health care professional or pharmacist for more information. What side effects may I notice from receiving this medicine? Side effects that you should report to your doctor or health care professional as soon as possible:  allergic reactions like skin rash, itching or hives, swelling of the face, lips, or tongue  bone pain  breathing problems  dizziness  jaw pain, especially after dental work  redness, blistering, peeling of the skin  signs and symptoms of infection like fever or chills; cough; sore throat; pain or trouble passing urine  signs of low calcium like fast heartbeat, muscle cramps or muscle pain; pain, tingling, numbness in the hands or feet; seizures  unusual bleeding or bruising  unusually weak or tired Side effects that usually do not require medical attention (report to your doctor or health care professional if they continue or are bothersome):  constipation  diarrhea  headache  joint pain  loss of appetite  muscle pain  runny nose  tiredness  upset stomach This list may not describe  all possible side effects. Call your doctor for medical advice about side effects. You may report side effects to FDA at 1-800-FDA-1088. Where should I keep my medicine? This medicine is only given in a clinic, doctor's office, or other health care setting and will not be stored at home. NOTE: This sheet is a summary. It may not cover all possible information. If you have questions about this medicine, talk to your doctor, pharmacist, or health care provider.  2020 Elsevier/Gold Standard (2018-02-17 16:10:44)

## 2019-07-09 NOTE — Progress Notes (Signed)
[place]  [phone] [phone]     ID: Katelyn Shelton DOB: [birth year]  MR#: [id number]  QPY#:[id number]  Patient Care Team: Marin Olp, MD as PCP - General (Family Medicine) Magrinat, Virgie Dad, MD as Consulting Physician (Oncology) Rolm Bookbinder, MD as Consulting Physician (General Surgery) Louretta Shorten, MD as Consulting Physician (Obstetrics and Gynecology) Juanita Craver Gerrie Nordmann, MD as Referring Physician (Hematology and Oncology) Kyung Rudd, MD as Consulting Physician (Radiation Oncology) Irene Limbo, MD as Consulting Physician (Plastic Surgery) Sorscher, Danice Goltz, MD as Referring Physician (Hematology and Oncology) Muss, Demaris Callander as Consulting Physician (Internal Medicine) Kathrynn Ducking, MD as Consulting Physician (Neurology) Dorothy Spark, MD as Consulting Physician (Cardiology) OTHER MD:  CHIEF COMPLAINT: Estrogen receptor positive breast cancer  CURRENT TREATMENT: anastrozole  INTERVAL HISTORY: Katelyn Shelton is here today for follow up of her estrogen receptor positive breast cancer.  She is doing moderately well.  She is taking Anastrozole daily. She notes that she continues to struggle with arthralgias, and that over the summer they have worsened.  She is undergoing evaluation by rheumatology due to an elevated ANA.    Katelyn Shelton also receives Prolia every 6 months for her osteopenia, T score of -2.  She tolerates this well and has no dental work such as extractions or implants ongoing.    REVIEW OF SYSTEMS: Brandyce is doing moderately well today.  She continues to exercise.  She also continues on Effexor for anxiety/depression along with assistance with the hot flashes that can happen with the anti estrogen therapy.  She is tolerating this well.  She denies any new side effects, other than her increasing joint aches noted above.  Her children are back at school at [place], and they have adjusted to a "new pandemic normal" and  are following all precautions accordingly.    Kirandeep denies any new headaches, or vision changes.  She is without any new pain in her back, chest pain, shortness of breath, cough, or palpitations.  She has no skin rash or lesion, bowel/bladder change, nausea, or vomiting.  A detailed ROS was otherwise non contributory today.    BREAST CANCER HISTORY: From the original intake note:  "Katelyn Shelton" saw Dr. Corinna Capra for routine follow-up and was found to have a palpable mass in the upper outer quadrant of her right breast and was set up for bilateral diagnostic mammography with tomography and right breast ultrasonography at the [facility] 02/08/2017. The breast density was category C. In the upper outer quadrant of the right breast there was an irregular mass. There was also a 0.3 cm group of slightly heterogeneous calcifications in the retroareolar left breast. On exam there was indeed a firm palpable mass at the 10:00 position of the right breast 4 cm from the nipple. On ultrasound this measured 3.5 cm, it was irregular and hypoechoic. There was no abnormal right axillary adenopathy.  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 The area of left breast calcifications was biopsied 02/09/2017 and showed only fibrocystic changes. ([id number] [id number]).  Her subsequent history is as detailed below.  PAST MEDICAL HISTORY: Past Medical History:  Diagnosis Date   Allergy    Anxiety    venflafaxine XR 75 mg   Breast cancer ([place]) 01/2017   right; genetic testing negative in 2018 with  [facility] panel   Heart murmur    as child- went away   History of MRSA infection    elbow   Hyperlipidemia    Joint pain    due to medicine induced joint pain   Migraine    otc med prn   Miscarriage    x1     Neuromuscular disorder (HCC)    neuropathy bilater feet - ? r/t chemo   Osteopenia    UTI (urinary tract infection)     PAST SURGICAL HISTORY: Past Surgical History:  Procedure Laterality Date   AREOLA/NIPPLE RECONSTRUCTION WITH GRAFT Right 12/01/2018   Procedure: RIGHT NIPPLE AREOLA COMPLEX RECONSTRUCTION WITH FULL THICKNESS SKIN GRAFT FROM RIGHT GROIN;  Surgeon: Irene Limbo, MD;  Location: [facility];  Service: Plastics;  Laterality: Right;   BREAST RECONSTRUCTION Left 12/01/2018   Procedure: LEFT BREAST REVISION RECONSTRUCTION WITH SKIN EXCISION;  Surgeon: Irene Limbo, MD;  Location: [facility] [facility];  Service: Plastics;  Laterality: Left;   BREAST RECONSTRUCTION Left 12/01/2018   Procedure: COMPOSITE GRAFT FROM LEFT NIPPLE (NIPPLE SHARING);  Surgeon: Irene Limbo, MD;  Location: [facility];  Service: Plastics;  Laterality: Left;   BREAST RECONSTRUCTION WITH PLACEMENT OF TISSUE EXPANDER AND FLEX HD (ACELLULAR HYDRATED DERMIS) Bilateral 02/28/2017   Procedure: BILATERAL BREAST RECONSTRUCTION WITH PLACEMENT OF TISSUE EXPANDER AND ALLODERM;  Surgeon: Irene Limbo, MD;  Location: [place];  Service: Plastics;  Laterality: Bilateral;   CESAREAN SECTION  2008,2010,2013   x 3   CESAREAN SECTION  02/04/2012   Procedure: CESAREAN SECTION;  Surgeon: Cyril Mourning, MD;  Location: [facility];  Service: Gynecology;  Laterality: N/A;  Repeat Cesarean Section Delivery  Boy  @  [phone], Apgars 9/10   COLONOSCOPY     polyp   DILATION AND CURETTAGE OF UTERUS     EXCISION OF BREAST LESION Right 03/15/2017   Procedure: RIGHT NIPPLE AND AREOLA EXCISION;  Surgeon: Rolm Bookbinder, MD;  Location: [facility];  Service: General;  Laterality: Right;   LAPAROSCOPIC VAGINAL HYSTERECTOMY WITH SALPINGO OOPHORECTOMY Bilateral 06/29/2018   Procedure: LAPAROSCOPIC ASSISTED VAGINAL HYSTERECTOMY WITH SALPINGO OOPHORECTOMY;   Surgeon: Louretta Shorten, MD;  Location: [facility];  Service: Gynecology;  Laterality: Bilateral;  Request case for [facility] CRNA   LIPOSUCTION WITH LIPOFILLING N/A 07/25/2018   Procedure: LIPOSUCTION WITH LIPOFILLING FROM ABDOMEN TO CHEST;  Surgeon: Irene Limbo, MD;  Location: [place];  Service: Plastics;  Laterality: N/A;   LIPOSUCTION WITH LIPOFILLING Bilateral 12/01/2018   Procedure: LIPOFILLING TO BILATERAL CHEST REVISION;  Surgeon: Irene Limbo, MD;  Location: [place];  Service: Plastics;  Laterality: Bilateral;   NIPPLE SPARING MASTECTOMY Bilateral 02/28/2017   Procedure: RIGHT NIPPLE SPARING MASTECTOMY; LEFT PROPHYLACTIC NIPPLE SPARING MASTECTOMY;  Surgeon: Rolm Bookbinder, MD;  Location: [place];  Service: General;  Laterality: Bilateral;   PECTUS EXCAVATUM REPAIR  5158   43 years old- very large implant   REMOVAL OF TISSUE EXPANDER AND PLACEMENT OF IMPLANT Bilateral 07/25/2018   Procedure: REMOVAL OF TISSUE EXPANDER AND PLACEMENT OF SILICONE IMPLANT;  Surgeon: Irene Limbo, MD;  Location: [place];  Service: Plastics;  Laterality: Bilateral;   SENTINEL NODE BIOPSY Right 02/28/2017   Procedure: RIGHT AXILLARY SENTINEL LYMPH  NODE BIOPSY;  Surgeon: Rolm Bookbinder, MD;  Location: [place];  Service: General;  Laterality: Right;   VULVAR LESION REMOVAL     WISDOM TOOTH EXTRACTION  FAMILY HISTORY Family History  Problem Relation Age of Onset   Skin cancer Mother    Hyperlipidemia Mother    Hypertension Mother    Colon polyps Mother    Hyperlipidemia Father    Parkinson's disease Sister 19       early on set   84 / Stillbirths Sister    Diabetes Maternal Aunt    Colon polyps Maternal Uncle    Heart failure Maternal Grandmother    Stroke Maternal Grandmother    Heart attack Maternal Grandfather    Depression Maternal Grandfather    Early death  Maternal Grandfather        93 MI   Stomach cancer Paternal Grandmother    Prostate cancer Paternal Grandfather    Alzheimer's disease Paternal Grandfather    Breast cancer Cousin        mother's paternal first FEMALE cousin   Colon polyps Sister    Colon cancer Neg Hx    Rectal cancer Neg Hx    The patient's father, Carolyne Fiscal, is an Administrator, Civil Service, currently 42 years old. The patient's mother is 19 years old as of April 2018. Patient has no brothers, 2 sisters. There is a history breast cancer in the family in a great aunt on the patient's father's side. On the maternal side there is a female first cousin of the patient's mother with breast cancer. There is no history of ovarian cancer in the family  GYNECOLOGIC HISTORY:  Patient's last menstrual period was 09/01/2017. Katelyn Shelton had her first menstrual period age 89, her first live birth age 36. She has had 3 C-sections. Her periods are approximately every 21 days, with one heavy day. Her husband is status post vasectomy.  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 Her husband [name] is one of our cardiologists. The patient attends [employer] DIRECTIVES:    HEALTH MAINTENANCE: Social History   Tobacco Use   Smoking status: Never Smoker   Smokeless tobacco: Never Used  Substance Use Topics   Alcohol use: Yes    Alcohol/week: 1.0 - 2.0 standard drinks    Types: 1 - 2 Standard drinks or equivalent per week    Comment: occasional wine - twice monthly   Drug use: No     Colonoscopy:  PAP:  Bone density: 11/25/2017 showed a T score of -2.0 osteopenic   Allergies  Allergen Reactions   Fentanyl Itching    Severe itching - Patient refuses this medication   Peach Flavor Anaphylaxis   Penicillins Hives    AS A CHILD   Sulfa Antibiotics Nausea And Vomiting   Sulfasalazine Nausea And Vomiting and Other (See Comments)   Latex Itching     Current Outpatient Medications  Medication Sig Dispense Refill   acetaminophen (TYLENOL) 325 MG tablet Take 650 mg by mouth every 6 (six) hours as needed.     anastrozole (ARIMIDEX) 1 MG tablet TAKE 1 TABLET BY MOUTH DAILY 90 tablet 4   Calcium Carbonate (CALCIUM 600 PO) Take 1 capsule by mouth daily.      denosumab (PROLIA) 60 MG/ML SOSY injection Inject 60 mg into the skin every 6 (six) months.     Evolocumab (REPATHA SURECLICK) 315 MG/ML SOAJ Inject 1 pen into the skin every 14 (fourteen) days. 2 pen 11   exemestane (AROMASIN) 25 MG tablet Take 1 tablet (25 mg total) by mouth daily after breakfast. 30 tablet  0   Multiple Vitamins-Minerals (MULTIVITAMIN WOMEN 50+) TABS Take 1 tablet by mouth daily.      venlafaxine XR (EFFEXOR-XR) 37.5 MG 24 hr capsule TAKE 1 CAPSULE BY MOUTH DAILY WITH BREAKFAST 90 capsule 4   No current facility-administered medications for this visit.     OBJECTIVE:  Vitals:   07/09/19 0915  BP: 115/75  Pulse: 87  Resp: 18  Temp: 98.2 F (36.8 C)  SpO2: 97%   Body mass index is 26.56 kg/m.  ECOG FS:1 - Symptomatic but completely ambulatory  GENERAL: Patient is a well appearing female in no acute distress HEENT:  Sclerae anicteric.  Oropharynx clear and moist. No ulcerations or evidence of oropharyngeal candidiasis. Neck is supple.  NODES:  No cervical, supraclavicular, or axillary lymphadenopathy palpated.  BREAST EXAM:  S/p bilateral mastectomies with implant placement.  No sign of local recurrence noted LUNGS:  Clear to auscultation bilaterally.  No wheezes or rhonchi. HEART:  Regular rate and rhythm. No murmur appreciated. ABDOMEN:  Soft, nontender.  Positive, normoactive bowel sounds. No organomegaly palpated. MSK:  No focal spinal tenderness to palpation. Full range of motion bilaterally in the upper extremities. EXTREMITIES:  No peripheral edema.   SKIN:  Clear with no obvious rashes or skin changes. No nail dyscrasia. NEURO:  Nonfocal.  Well oriented.  Appropriate affect.    LAB RESULTS:  CMP     Component Value Date/Time   NA 140 07/09/2019 0855   NA 140 08/19/2017 1337   K 4.4 07/09/2019 0855   K 3.6 08/19/2017 1337   CL 105 07/09/2019 0855   CO2 26 07/09/2019 0855   CO2 24 08/19/2017 1337   GLUCOSE 86 07/09/2019 0855   GLUCOSE 88 08/19/2017 1337   BUN 12 07/09/2019 0855   BUN 10.9 08/19/2017 1337   CREATININE 0.75 07/09/2019 0855   CREATININE 0.7 08/19/2017 1337   CALCIUM 9.4 07/09/2019 0855   CALCIUM 9.0 08/19/2017 1337   PROT 7.6 07/09/2019 0855   PROT 7.0 06/22/2018 0817   PROT 6.6 08/19/2017 1337   ALBUMIN 4.6 07/09/2019 0855   ALBUMIN 4.6 06/22/2018 0817   ALBUMIN 3.9 08/19/2017 1337   AST 29 07/09/2019 0855   AST 35 (H) 08/19/2017 1337   ALT 32 07/09/2019 0855   ALT 55 08/19/2017 1337   ALKPHOS 46 07/09/2019 0855   ALKPHOS 61 08/19/2017 1337   BILITOT 0.4 07/09/2019 0855   BILITOT 0.41 08/19/2017 1337   GFRNONAA >60 07/09/2019 0855   GFRAA >60 07/09/2019 0855    No results found for: TOTALPROTELP, ALBUMINELP, A1GS, A2GS, BETS, BETA2SER, GAMS, MSPIKE, SPEI  No results found for: Nils Pyle, [facility]  Lab Results  Component Value Date   WBC 4.6 07/09/2019   NEUTROABS 2.7 07/09/2019   HGB 14.6 07/09/2019   HCT 43.0 07/09/2019   MCV 95.6 07/09/2019   PLT 211 07/09/2019      Chemistry      Component Value Date/Time   NA 140 07/09/2019 0855   NA 140 08/19/2017 1337   K 4.4 07/09/2019 0855   K 3.6 08/19/2017 1337   CL 105 07/09/2019 0855   CO2 26 07/09/2019 0855   CO2 24 08/19/2017 1337   BUN 12 07/09/2019 0855   BUN 10.9 08/19/2017 1337   CREATININE 0.75 07/09/2019 0855   CREATININE 0.7 08/19/2017 1337      Component Value Date/Time   CALCIUM 9.4 07/09/2019 0855   CALCIUM 9.0 08/19/2017 1337   ALKPHOS 46 07/09/2019 0855   ALKPHOS  61 08/19/2017 1337   AST 29 07/09/2019 0855   AST 35 (H) 08/19/2017 1337   ALT 32 07/09/2019 0855   ALT 55 08/19/2017 1337    BILITOT 0.4 07/09/2019 0855   BILITOT 0.41 08/19/2017 1337       No results found for: LABCA2  No components found for: [id number]  No results for input(s): INR in the last 168 hours.  Urinalysis    Component Value Date/Time   COLORURINE YELLOW 08/26/2011 1652   APPEARANCEUR CLEAR 08/26/2011 1652   LABSPEC 1.025 12/21/2013 1226   PHURINE 6.0 12/21/2013 1226   GLUCOSEU NEGATIVE 12/21/2013 1226   HGBUR TRACE (A) 12/21/2013 1226   BILIRUBINUR Negative 05/16/2018 1129   KETONESUR NEGATIVE 12/21/2013 1226   PROTEINUR Negative 05/16/2018 1129   PROTEINUR NEGATIVE 12/21/2013 1226   UROBILINOGEN 0.2 05/16/2018 1129   UROBILINOGEN 0.2 12/21/2013 1226   NITRITE Negative 05/16/2018 1129   NITRITE NEGATIVE 12/21/2013 1226   LEUKOCYTESUR Moderate (2+) (A) 05/16/2018 1129     STUDIES: She underwent a total hysterectomy on 06/29/2018. She is due for a Chest CT in December 2019.  ELIGIBLE FOR AVAILABLE RESEARCH PROTOCOL: no  ASSESSMENT: 43 y.o. [place] woman status post right breast upper outer quadrant biopsy 02/08/2017 for a clinical T2 N0, stage IB invasive ductal carcinoma, grade 2, estrogen and progesterone receptor positive, HER-2 nonamplified, with an MIB-1 of 5%.  (a) biopsy of upper outer quadrant calcifications in the left breast 02/09/2017 were benign  (1) Oncotype DX obtained from the biopsy sample showed a recurrence score of 11 predicting a 10 year risk of recurrence outside the breast of 9% if the patient's only systemic therapy is tamoxifen for 5 years (node positive report)  (2) bilateral mastectomies with right sentinel lymph node sampling 02/28/2017 showed  (a) on the left, intraductal papilloma, with no evidence of malignancy.  (b) on the right, a pT2 pN1 stage IIA invasive ductal carcinoma, grade 1, with close margins  (c) additional excision of the right nipple areolar area 03/15/2017 found no residual carcinoma  (d) status post bilateral definitive silicone  implant placement 07/25/2018  (3) Mammaprint study on the final surgical sample came back low risk, predicting a five-year distant disease-free survival of 97.8% with hormone therapy alone   (3) tamoxifen started neoadjuvantly 02/11/2017, Stopped 04/11/2017 in preparation for chemotherapy  (4) genetics counseling 02/19/2017 showed no deleterious mutations in the STAT gene panel offered by [facility] including ATM, BRCA1, BRCA2, CDH1, CHEK2, PALB2, PTEN, STK11, and TP53.  (a) multi gene panel February 21, 2017 through the  Multi-Gene Panel offered by [facility] found no deleterious mutations in ALK, APC, ATM, AXIN2,BAP1,  BARD1, BLM, BMPR1A, BRCA1, BRCA2, BRIP1, CASR, CDC73, CDH1, CDK4, CDKN1B, CDKN1C, CDKN2A (p14ARF), CDKN2A (p16INK4a), CEBPA, CHEK2, CTNNA1, DICER1, DIS3L2, EGFR (c.2369C>T, p.Thr790Met variant only), EPCAM (Deletion/duplication testing only), FH, FLCN, GATA2, GPC3, GREM1 (Promoter region deletion/duplication testing only), HOXB13 (c.251G>A, p.Gly84Glu), HRAS, KIT, MAX, MEN1, MET, MITF (c.952G>A, p.Glu318Lys variant only), MLH1, MSH2, MSH3, MSH6, MUTYH, NBN, NF1, NF2, NTHL1, PALB2, PDGFRA, PHOX2B, PMS2, POLD1, POLE, POT1, PRKAR1A, PTCH1, PTEN, RAD50, RAD51C, RAD51D, RB1, RECQL4, RET, RUNX1, SDHAF2, SDHA (sequence changes only), SDHB, SDHC, SDHD, SMAD4, SMARCA4, SMARCB1, SMARCE1, STK11, SUFU, TERT, TERT, TMEM127, TP53, TSC1, TSC2, VHL, WRN and WT1.   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  (9) significant hypercholesterolemia  (10) CT scan for cardiac scoring 03/30/2018 showed clustered nodules in the inferior right upper lobe.  This was felt to be most likely inflammatory.  Repeat CT of the chest on 10/10/2018 shows subpleural nodular scarring in the anterolateral right upper and middle lobes, unchanged from prior, compatible with radiation changes.  No findings suspicious for recurrent or metastatic disease.     PLAN: Katelyn Shelton is doing well today.  She is clinically without any signs of breast cancer recurrence.  She continues on Anastrozole daily and is struggling with an increase in arthralgias.  She is motivated to continue the Anastrozole and doesn't want to switch to Tamoxifen due to the potential difference in outcomes.  I reviewed with her that Exemestane is a non-inferior aromatase inhibitor that she may tolerate better.  I gave her detailed information about the Exemestane and she is going to look into it.  I suggested she take a week off of the Anastrozole (she says she is hesitant to take any time off) to get it out of her system.  I sent in a 30 day supply for her to try and she will let us know if it works better for her.    We reviewed bone health as well today, since she does have a h/o osteopenia and receives Prolia every 6 months.  She will continue on this and is tolerating it well.  I gave her a handout in her AVS on bone health and we reviewed calcium, vitamin d, and weight bearing exercises.  She will be due for repeat bone density testing in 11/2019 with her PCP.  Katelyn Shelton had a very challenging time since the injection room was moved over to the infusion suite.  It triggered a lot of stress for her that she had not anticipated from the time she was undergoing her chemotherapy.  She wants to know if she can receive her injections in the exam room from now on.  I let her know that we are happy to accommodate this for her.    Katelyn Shelton and I  discussed healthy diet, exercise, and keeping up with her health maintenance.  She is doing all of these things.    We will see her back in 6 months for labs, f/u, and her next Prolia.  She was recommended to continue with the appropriate pandemic precautions. She knows to call for any questions that may arise between now and her next appointment.  We are happy to see her sooner if needed.  A total of (30) minutes of face-to-face time was spent with this patient with greater than 50% of that time in counseling and care-coordination.    Wilber Bihari, NP  07/09/19 10:54 AM Medical Oncology and Hematology [facility] [address] [place], [place] [address] Tel. [phone]    Fax. [phone]

## 2019-07-10 ENCOUNTER — Telehealth: Payer: Self-pay | Admitting: Adult Health

## 2019-07-10 ENCOUNTER — Other Ambulatory Visit: Payer: Self-pay

## 2019-07-10 ENCOUNTER — Telehealth: Payer: Self-pay | Admitting: Family Medicine

## 2019-07-10 DIAGNOSIS — E2839 Other primary ovarian failure: Secondary | ICD-10-CM

## 2019-07-10 LAB — VITAMIN D 25 HYDROXY (VIT D DEFICIENCY, FRACTURES): Vit D, 25-Hydroxy: 72.5 ng/mL (ref 30.0–100.0)

## 2019-07-10 NOTE — Telephone Encounter (Signed)
Order needs to be placed. Send to Admin once order is placed to schedule  Copied from [place] [id number]. Topic: Referral - Request for Referral >> Jul 10, 2019  9:29 AM Reyne Dumas L wrote: Has patient seen PCP for this complaint? yes *If NO, is insurance requiring patient see PCP for this issue before PCP can refer them? Referral for which specialty: bone density Preferred provider/office: unknown Reason for referral: would like the appointment for a bone density scan to be done after November 26, 2019.  Pt can be reached at [phone]

## 2019-07-10 NOTE — Telephone Encounter (Signed)
Called and left a voicemail for the patient to call [facility] to schedule at [phone]. No need to route note.

## 2019-07-10 NOTE — Telephone Encounter (Signed)
I talk with patient regarding schedule  

## 2019-07-10 NOTE — Telephone Encounter (Signed)
Order has been placed.

## 2019-07-12 MED FILL — REPATHA SURECLICK 140 MG/ML: 140 | 28 days supply | Qty: 2 | Fill #2

## 2019-07-16 ENCOUNTER — Ambulatory Visit: Payer: Self-pay | Admitting: *Deleted

## 2019-07-16 ENCOUNTER — Ambulatory Visit (INDEPENDENT_AMBULATORY_CARE_PROVIDER_SITE_OTHER): Payer: 59 | Admitting: Family Medicine

## 2019-07-16 ENCOUNTER — Encounter: Payer: Self-pay | Admitting: Family Medicine

## 2019-07-16 ENCOUNTER — Ambulatory Visit (HOSPITAL_COMMUNITY)
Admission: RE | Admit: 2019-07-16 | Discharge: 2019-07-16 | Disposition: A | Discharge status: Home / Self Care | Payer: 59 | Source: Ambulatory Visit | Attending: Family Medicine | Admitting: Family Medicine

## 2019-07-16 ENCOUNTER — Other Ambulatory Visit: Payer: Self-pay

## 2019-07-16 VITALS — BP 112/70 | HR 76 | Temp 98.7°F | Ht 60.0 in | Wt 133.8 lb

## 2019-07-16 DIAGNOSIS — W19XXXA Unspecified fall, initial encounter: Secondary | ICD-10-CM | POA: Diagnosis not present

## 2019-07-16 DIAGNOSIS — G44319 Acute post-traumatic headache, not intractable: Secondary | ICD-10-CM | POA: Insufficient documentation

## 2019-07-16 DIAGNOSIS — S0003XA Contusion of scalp, initial encounter: Secondary | ICD-10-CM | POA: Diagnosis not present

## 2019-07-16 NOTE — Telephone Encounter (Signed)
Called pt and scheduled as work in today at 11:40 AM.

## 2019-07-16 NOTE — Telephone Encounter (Signed)
Please advise regarding appt

## 2019-07-16 NOTE — Telephone Encounter (Signed)
Pt called stating she went hiking on 07/14/2019 and hit her head; she says that she did not lose consciousness, but she has a large hematoma the size of a tennis ball above her left eye; her left eye is also red; recommendations made per nurse triage protocol; she verbalized understanding, but stated that she is not going to the ED; she would like to be seen so that Dr Yong Channel could make recommendations on her having a head CT; the [name], [facility]; spoke with [facility]; she requested that this be routed to the office, and the pt will receive a return call; the pt can be contacted at [phone], and a message can be left on the voicemail.    Reason for Disposition . Large swelling or bruise > 2 inches (5 cm)  Answer Assessment - Initial Assessment Questions 1. MECHANISM: "How did the injury happen?" For falls, ask: "What height did you fall from?" and "What surface did you fall against?"      Pt fell while hiking, and fell forward; hit head on rock 2. ONSET: "When did the injury happen?" (Minutes or hours ago)      07/14/2019 3. NEUROLOGIC SYMPTOMS: "Was there any loss of consciousness?" "Are there any other neurological symptoms?"      no 4. MENTAL STATUS: "Does the person know who he is, who you are, and where he is?"      yes 5. LOCATION: "What part of the head was hit?"      Above left eyerow 6. SCALP APPEARANCE: "What does the scalp look like? Is it bleeding now?" If so, ask: "Is it difficult to stop?"       No bleeding; looks normal 7. SIZE: For cuts, bruises, or swelling, ask: "How large is it?" (e.g., inches or centimeters)    Size of tennis ball 8. PAIN: "Is there any pain?" If so, ask: "How bad is it?"  (e.g., Scale 1-10; or mild, moderate, severe)   Tenderness rated  3-4 out of 10 9. TETANUS: For any breaks in the skin, ask: "When was the last tetanus booster?"    Yes abrasion on face 10. OTHER SYMPTOMS: "Do you have any other symptoms?" (e.g., neck pain,  vomiting)       Left eye red 11. PREGNANCY: "Is there any chance you are pregnant?" "When was your last menstrual period?"       No, hysterectomy  Protocols used: HEAD INJURY-A-AH

## 2019-07-16 NOTE — Patient Instructions (Addendum)
Significant trauma this weekend  Thankfully minimal symptoms other than headache/tenderness over injury site.   We discussed two primary options 1. Monitor as largely asymptomatic 2. Head CT to rule out fracture/subdural hematoma  You preferred option #2 and we will try to set up head CT for today to be on the safe side/for peace of mind

## 2019-07-16 NOTE — Progress Notes (Signed)
Phone [phone]   Subjective:  Katelyn Shelton is a 43 y.o. year old very pleasant female patient who presents for/with See problem oriented charting Chief Complaint  Patient presents with  . Acute Visit  . Head Injury   ROS- No facial or extremity weakness. No slurred words or trouble swallowing. no blurry vision or double vision. No paresthesias. No confusion or word finding difficulties.   Past Medical History-  Patient Active Problem List   Diagnosis Date Noted  . Malignant neoplasm of upper-outer quadrant of right breast in female, estrogen receptor positive ([place]) 02/11/2017    Priority: High  . Hyperlipidemia, familial, high LDL 01/16/2010    Priority: High  . Arthralgia 02/24/2018    Priority: Medium  . Enchondroma of bone 02/24/2018    Priority: Medium  . Anxiety     Priority: Medium  . Hidradenitis suppurativa 08/26/2011    Priority: Medium  . Family history of colonic polyps 02/24/2018    Priority: Low  . Genetic testing 02/21/2017    Priority: Low  . Family history of stomach cancer     Priority: Low  . S/P laparoscopic assisted vaginal hysterectomy (LAVH) 06/29/2018  . Lesion of lung 04/05/2018  . Abnormal CT scan of lung 04/05/2018  . History of cancer 04/05/2018    Medications- reviewed and updated Current Outpatient Medications  Medication Sig Dispense Refill  . acetaminophen (TYLENOL) 325 MG tablet Take 650 mg by mouth every 6 (six) hours as needed.    [name] anastrozole (ARIMIDEX) 1 MG tablet TAKE 1 TABLET BY MOUTH DAILY 90 tablet 4  . Calcium Carbonate (CALCIUM 600 PO) Take 1 capsule by mouth daily.     [name] denosumab (PROLIA) 60 MG/ML SOSY injection Inject 60 mg into the skin every 6 (six) months.    . Evolocumab (REPATHA SURECLICK) [id number] MG/ML SOAJ Inject 1 pen into the skin every 14 (fourteen) days. 2 pen 11  . exemestane (AROMASIN) 25 MG tablet Take 1 tablet (25 mg total) by mouth daily after breakfast. 30 tablet 0  . Multiple Vitamins-Minerals  (MULTIVITAMIN WOMEN 50+) TABS Take 1 tablet by mouth daily.     [name] venlafaxine XR (EFFEXOR-XR) 37.5 MG 24 hr capsule TAKE 1 CAPSULE BY MOUTH DAILY WITH BREAKFAST 90 capsule 4     Objective:  BP 112/70   Pulse 76   Temp 98.7 F (37.1 C)   Ht 5' (1.524 m)   Wt 133 lb 12.8 oz (60.7 kg)   LMP 09/01/2017   SpO2 98%   BMI 26.13 kg/m  Gen: NAD, resting comfortably Raised area about 3 x 3 cm surrounded by some erythema on left forehead-area is tender to touch Extensive bruising around left eye including upper and lower eyelid CV: RRR no murmurs rubs or gallops Lungs: CTAB no crackles, wheeze, rhonchi Abdomen: soft/nontender/nondistended/normal bowel sounds. No rebound or guarding.  Ext: no edema Skin: warm, dry Neuro: CN II-XII intact, sensation and reflexes normal throughout, 5/5 muscle strength in bilateral upper and lower extremities. Normal finger to nose. [name] No pronator drift. Normal romberg. Normal gait.       Assessment and Plan   # Head Injury S:Hiking on 07/14/19 Saturday, fell and hit head. Was walking forward turned around to talk and tripped on something/foot got caught- fell down quickly onto left forehead onto a rock that was somewhat pointy.  Only symptom is headache/primarily superficial tenderness over left scalp where she hit. Does have significant bruising around the left eye including over eyelid. Denies  LOC.  No dizziness. No impaired vision. No nausea.  Did get tennis ball size swelling (if cut in half) quickly afterwards. Used ice all day Saturday. Tender to touch- up to 3-4/10 aching sensation-head hurts in this area.  Her left eye is also red. Pt stated that refused to go to the ED. Pt wants Dr. Yong Channel to make recommendations on her having a head CT.  She states she is not having any symptoms of concussion and she has been using ice on her eye and forehead.denies confusion.  A/P: Patient is largely asymptomatic other than a slight headache/tenderness around direct point  of impact.  Does have extensive bruising under left eye.  Offered monitoring versus proceeding with head CT-she would prefer head CT option to rule out fracture/subdural hematoma-this was ordered today for stat and will try to complete by this afternoon  Recommended follow up: As needed for acute issue Future Appointments  Date Time Provider [place]  07/16/2019  4:30 PM [facility]-CT 2 [facility]CT [facility]  12/04/2019  9:00 AM [facility] DEXA 1 [facility]  01/07/2020  9:00 AM [facility] LAB 5 [facility] None  01/07/2020  9:30 AM Causey, Charlestine Massed, NP [facility] None  01/07/2020 10:15 AM [facility] [place] FLUSH [facility] None   Lab/Order associations:   ICD-10-CM   1. Fall, initial encounter  W19.XXXA   2. Acute post-traumatic headache, not intractable  G44.319 CT Head Wo Contrast    CANCELED: CT Head Wo Contrast  Canceled and changed to stat on head CT New acute condition with concern for significant comorbidity-potential for subdural hematoma  Return precautions advised.  Garret Reddish, MD

## 2019-07-26 MED FILL — VENLAFAXINE HCL ER 37.5 MG: 37.5 | 90 days supply | Qty: 90 | Fill #0

## 2019-07-31 DIAGNOSIS — Z20828 Contact with and (suspected) exposure to other viral communicable diseases: Secondary | ICD-10-CM | POA: Diagnosis not present

## 2019-08-01 DIAGNOSIS — M2550 Pain in unspecified joint: Secondary | ICD-10-CM | POA: Diagnosis not present

## 2019-08-01 DIAGNOSIS — E789 Disorder of lipoprotein metabolism, unspecified: Secondary | ICD-10-CM | POA: Diagnosis not present

## 2019-08-01 DIAGNOSIS — Z833 Family history of diabetes mellitus: Secondary | ICD-10-CM | POA: Diagnosis not present

## 2019-08-08 ENCOUNTER — Other Ambulatory Visit: Payer: Self-pay | Admitting: Adult Health

## 2019-08-08 DIAGNOSIS — Z170 Estrogen receptor positive status [ER+]: Secondary | ICD-10-CM

## 2019-08-08 DIAGNOSIS — C50411 Malignant neoplasm of upper-outer quadrant of right female breast: Secondary | ICD-10-CM

## 2019-08-08 MED FILL — REPATHA SURECLICK 140 MG/ML: 140 | 28 days supply | Qty: 2 | Fill #3

## 2019-08-09 MED FILL — EXEMESTANE 25 MG TABLET: 25 | 90 days supply | Qty: 90 | Fill #0

## 2019-09-06 MED FILL — REPATHA SURECLICK 140 MG/ML: 140 | 28 days supply | Qty: 2 | Fill #4

## 2019-10-02 DIAGNOSIS — Z923 Personal history of irradiation: Secondary | ICD-10-CM | POA: Diagnosis not present

## 2019-10-02 DIAGNOSIS — Z853 Personal history of malignant neoplasm of breast: Secondary | ICD-10-CM | POA: Diagnosis not present

## 2019-10-02 DIAGNOSIS — Z9013 Acquired absence of bilateral breasts and nipples: Secondary | ICD-10-CM | POA: Diagnosis not present

## 2019-10-09 MED FILL — REPATHA SURECLICK 140 MG/ML: 140 | 28 days supply | Qty: 2 | Fill #5

## 2019-10-12 DIAGNOSIS — Z20828 Contact with and (suspected) exposure to other viral communicable diseases: Secondary | ICD-10-CM | POA: Diagnosis not present

## 2019-10-15 DIAGNOSIS — Z20828 Contact with and (suspected) exposure to other viral communicable diseases: Secondary | ICD-10-CM | POA: Diagnosis not present

## 2019-10-25 MED FILL — VENLAFAXINE HCL ER 37.5 MG: 37.5 | 90 days supply | Qty: 90 | Fill #1

## 2019-10-25 MED FILL — EXEMESTANE 25 MG TABLET: 25 | 90 days supply | Qty: 90 | Fill #1

## 2019-11-07 MED FILL — REPATHA SURECLICK 140 MG/ML: 140 | 28 days supply | Qty: 2 | Fill #6

## 2019-11-09 ENCOUNTER — Other Ambulatory Visit: Payer: Self-pay | Admitting: Oncology

## 2019-11-09 ENCOUNTER — Encounter: Payer: Self-pay | Admitting: Oncology

## 2019-11-24 DIAGNOSIS — Z23 Encounter for immunization: Secondary | ICD-10-CM | POA: Diagnosis not present

## 2019-12-04 ENCOUNTER — Other Ambulatory Visit: Payer: 59

## 2019-12-05 MED FILL — REPATHA SURECLICK 140 MG/ML: 140 | 28 days supply | Qty: 2 | Fill #7

## 2019-12-06 ENCOUNTER — Encounter: Payer: Self-pay | Admitting: Infectious Diseases

## 2019-12-06 ENCOUNTER — Other Ambulatory Visit: Payer: Self-pay

## 2019-12-06 ENCOUNTER — Ambulatory Visit (INDEPENDENT_AMBULATORY_CARE_PROVIDER_SITE_OTHER): Payer: 59 | Admitting: Infectious Diseases

## 2019-12-06 DIAGNOSIS — N61 Mastitis without abscess: Secondary | ICD-10-CM | POA: Insufficient documentation

## 2019-12-06 DIAGNOSIS — Z9013 Acquired absence of bilateral breasts and nipples: Secondary | ICD-10-CM | POA: Diagnosis not present

## 2019-12-06 DIAGNOSIS — Z853 Personal history of malignant neoplasm of breast: Secondary | ICD-10-CM | POA: Diagnosis not present

## 2019-12-06 DIAGNOSIS — Z923 Personal history of irradiation: Secondary | ICD-10-CM | POA: Diagnosis not present

## 2019-12-06 MED ORDER — LINEZOLID 600 MG PO TABS: 600.0000 mg | ORAL_TABLET | Freq: Two times a day (BID) | ORAL | 0 refills | Status: DC

## 2019-12-06 MED FILL — LINEZOLID 600 MG TAB: 600 | 1 days supply | Qty: 2 | Fill #0

## 2019-12-06 NOTE — Progress Notes (Signed)
   Subjective:    Patient ID: Katelyn Shelton, female    DOB: [birth year], 44 y.o.   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 1996. She had previous infection of her L elbow.  Her course was then complicated by R breast cancer, surgery 02-2017 (+LN), double mastectomy and double breast implants done 11-25-2018.  Has had CTX (June to Dec 2018) followed by XRT (6 weeks). Recurence free. She first noted erythema and tenderness over last 24h. She noted tightness, esp after stretching, running and being on the floor.  She was started on cipro by her plastic surgery yeterday. She felt that this is not improved and feels she is more erythematous.   Review of Systems  Constitutional: Positive for chills. Negative for appetite change, fever and unexpected weight change.  Respiratory: Negative for cough and shortness of breath.   Gastrointestinal: Negative for constipation and diarrhea.  Genitourinary: Negative for difficulty urinating.  Skin: Positive for color change and rash.  hasn't taken temp. Has felt cold.      Objective:   Physical Exam Vitals reviewed.  Constitutional:      General: She is not in acute distress.    Appearance: Normal appearance. She is not toxic-appearing.  HENT:     Mouth/Throat:     Mouth: Mucous membranes are moist.     Pharynx: No oropharyngeal exudate.  Eyes:     Extraocular Movements: Extraocular movements intact.     Pupils: Pupils are equal, round, and reactive to light.  Cardiovascular:     Rate and Rhythm: Normal rate and regular rhythm.  Pulmonary:     Effort: Pulmonary effort is normal.     Breath sounds: Normal breath sounds.  Abdominal:     General: Bowel sounds are normal. There is no distension.     Palpations: Abdomen is soft.     Tenderness: There is no abdominal tenderness.  Musculoskeletal:     Cervical back: Normal range of motion and neck supple.   Right lower leg: No edema.     Left lower leg: No edema.  Skin:      Neurological:     General: No focal deficit present.     Mental Status: She is alert.  Psychiatric:        Mood and Affect: Mood normal.           Assessment & Plan:

## 2019-12-06 NOTE — Assessment & Plan Note (Addendum)
She has cellulitis around her breast. Hopefully this is superficial and does not involve her prosthesis.  Although, prosthesis do occasionally get atypical infections (mycobacteria), will treat her for common things first- staph, strep.  My suspicion for a atypical infection is diminished due to the distance from her surgery.  Will give her injection of dalbavnacin (wil lbe infused in AM).  I will give her dose of zyvox this pm and in am. I contacted the patient's husband and asked him to hold this for 1 week. Discused with pharmacy.  I have asked her to hold her SSRI this pm, 2-12 and 2-13.  Will keep cipro on board as well.  Will see her back in 1 week.

## 2019-12-07 ENCOUNTER — Ambulatory Visit (HOSPITAL_COMMUNITY)
Admission: RE | Admit: 2019-12-07 | Discharge: 2019-12-07 | Disposition: A | Discharge status: Home / Self Care | Payer: 59 | Source: Ambulatory Visit | Attending: Internal Medicine | Admitting: Internal Medicine

## 2019-12-07 ENCOUNTER — Other Ambulatory Visit: Payer: Self-pay

## 2019-12-07 DIAGNOSIS — N61 Mastitis without abscess: Secondary | ICD-10-CM | POA: Diagnosis not present

## 2019-12-07 MED ORDER — DEXTROSE 5 % IV SOLN
1500.0000 mg | Freq: Once | INTRAVENOUS | Status: AC
  Administered 2019-12-07: 12:00:00 1500 mg via INTRAVENOUS
  Filled 2019-12-07: qty 75

## 2019-12-07 MED ORDER — DEXTROSE 5 % IV BOLUS
250.0000 mL | Freq: Once | INTRAVENOUS | Status: AC
  Administered 2019-12-07: 12:00:00 250 mL via INTRAVENOUS
  Filled 2019-12-07: qty 250

## 2019-12-07 NOTE — Progress Notes (Signed)
PATIENT CARE CENTER NOTE  Diagnosis: Cellulitis left breast    Provider:  Verlin Grills, MD   Procedure: Dalbavancin (Dalvance) infusion   Note: Patient received Dalvance infusion via PIV. Tolerated well with no adverse reaction. Dextrose 5% flush given post-infusion. Vital signs stable. Discharge instructions given. Patient alert, oriented and ambulatory at discharge.

## 2019-12-07 NOTE — Discharge Instructions (Signed)
Dalbavancin injection What is this medicine? DALBAVANCIN (DAL ba Lucianne Lei sin) is an antibiotic. It is used to treat certain kinds of bacterial infections. It will not work for colds, flu, or other viral infections. This medicine may be used for other purposes; ask your health care provider or pharmacist if you have questions. COMMON BRAND NAME(S): DALVANCE What should I tell my health care provider before I take this medicine? They need to know if you have any of these conditions:  intestine problems, like colitis  an unusual or allergic reaction to dalbavancin, other medicines, foods, dyes, or preservatives  pregnant or trying to get pregnant  breast-feeding How should I use this medicine? This medicine is infused into a vein. It is usually given by a health care professional in a hospital or clinic setting. If you get this medicine at home, you will be taught how to prepare and give this medicine. Use exactly as directed. Take your medicine at regular intervals. Do not take your medicine more often than directed. It is important that you put your used needles and syringes in a special sharps container. Do not put them in a trash can. If you do not have a sharps container, call your pharmacist or healthcare provider to get one. Talk to your pediatrician regarding the use of this medicine in children. Special care may be needed. Overdosage: If you think you have taken too much of this medicine contact a poison control center or emergency room at once. NOTE: This medicine is only for you. Do not share this medicine with others. What if I miss a dose? It is important not to miss your dose. Call your doctor or health care professional if you are unable to keep an appointment. If you give yourself the medicine and you miss a dose, take it as soon as you can. If it is almost time for your next dose, take only that dose. Do not take double or extra doses. What may interact with this medicine? This  medicine may interact with the following medications:  birth control pills This list may not describe all possible interactions. Give your health care provider a list of all the medicines, herbs, non-prescription drugs, or dietary supplements you use. Also tell them if you smoke, drink alcohol, or use illegal drugs. Some items may interact with your medicine. What should I watch for while using this medicine? Tell your doctor or healthcare professional if your symptoms do not start to get better or if they get worse. Do not treat diarrhea with over the counter products. Contact your doctor if you have diarrhea that lasts more than 2 days or if it is severe and watery. What side effects may I notice from receiving this medicine? Side effects that you should report to your doctor or health care professional as soon as possible:  allergic reactions like skin rash, itching or hives, swelling of the face, lips, or tongue  bloody or watery diarrhea Side effects that usually do not require medical attention (report to your doctor or health care professional if they continue or are bothersome):  diarrhea  headache  nausea, vomiting This list may not describe all possible side effects. Call your doctor for medical advice about side effects. You may report side effects to FDA at 1-800-FDA-1088. Where should I keep my medicine? Keep out of the reach of children. This drug is usually given in a hospital or clinic and will not be stored at home. In rare cases, this medicine may  be given at home. If you are using this medicine at home, you will be instructed on how to store this medicine. Throw away any unused medicine after the expiration date on the label. NOTE: This sheet is a summary. It may not cover all possible information. If you have questions about this medicine, talk to your doctor, pharmacist, or health care provider.  2020 Elsevier/Gold Standard (2015-11-13 08:38:08)

## 2019-12-10 ENCOUNTER — Encounter (HOSPITAL_COMMUNITY): Payer: 59

## 2019-12-10 DIAGNOSIS — Z9013 Acquired absence of bilateral breasts and nipples: Secondary | ICD-10-CM | POA: Diagnosis not present

## 2019-12-10 DIAGNOSIS — Z853 Personal history of malignant neoplasm of breast: Secondary | ICD-10-CM | POA: Diagnosis not present

## 2019-12-10 DIAGNOSIS — L03313 Cellulitis of chest wall: Secondary | ICD-10-CM | POA: Diagnosis not present

## 2019-12-10 DIAGNOSIS — Z923 Personal history of irradiation: Secondary | ICD-10-CM | POA: Diagnosis not present

## 2019-12-12 ENCOUNTER — Other Ambulatory Visit: Payer: Self-pay

## 2019-12-12 ENCOUNTER — Ambulatory Visit (INDEPENDENT_AMBULATORY_CARE_PROVIDER_SITE_OTHER): Payer: 59 | Admitting: Infectious Diseases

## 2019-12-12 ENCOUNTER — Telehealth: Payer: Self-pay | Admitting: *Deleted

## 2019-12-12 ENCOUNTER — Encounter: Payer: Self-pay | Admitting: Infectious Diseases

## 2019-12-12 DIAGNOSIS — N61 Mastitis without abscess: Secondary | ICD-10-CM | POA: Diagnosis not present

## 2019-12-12 NOTE — Telephone Encounter (Signed)
Patient scheduled per Dr Johnnye Sima for infusion at [facility] on 2/22 at 10:00 (arrive 9:45).  RN left message for patient with appointment information on her voicemail. Orders faxed to [facility] at [phone]. Landis Gandy, RN

## 2019-12-12 NOTE — Assessment & Plan Note (Signed)
She is doing well.  We spoke about the long term health of her prosthesis. There is a possibility that as a foreign this could be come infected for the long term. We spoke about long term po anbx or to extend her current anbx.  We will give her another dose of oritavancin on 12-17-19.  Will see her back in 3 weeks from today.

## 2019-12-12 NOTE — Progress Notes (Signed)
   Subjective:    Patient ID: Katelyn Shelton, female    DOB: [birth year], 44 y.o.   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 1996. She had previous infection of her L elbow.  Her course was then complicated by R breast cancer, surgery 02-2017 (+LN), double mastectomy and double breast implants done 11-25-2018.  Has had CTX (June to Dec 2018) followed by XRT (6 weeks). Recurence free. She first noted erythema and tenderness over last 24h. She noted tightness, esp after stretching, running and being on the floor.  She was started on cipro by her plastic surgery 2-10. She was seen in ID on 2-11 and given 24h of zyvox then a single dose of oritavancin on 2-12.  I contacted her husband via text over the weekend and she was improving.  Today she feels much improved- She still has some "tightness" in her breast. She has had no f/c.  The erythema in her breast is less present underneath her breast, she has noted a red-ish area.  She noted no difficulties or adr from the antibiotics.   Review of Systems Please see HPI. All other systems reviewed and negative.     Objective:   Physical Exam Vitals reviewed.  Constitutional:      Appearance: Normal appearance.  Chest:    Neurological:     Mental Status: She is alert.           Assessment & Plan:

## 2019-12-17 ENCOUNTER — Other Ambulatory Visit: Payer: Self-pay

## 2019-12-17 ENCOUNTER — Ambulatory Visit (HOSPITAL_COMMUNITY)
Admission: RE | Admit: 2019-12-17 | Discharge: 2019-12-17 | Disposition: A | Discharge status: Home / Self Care | Payer: 59 | Source: Ambulatory Visit | Attending: Infectious Diseases | Admitting: Infectious Diseases

## 2019-12-17 DIAGNOSIS — N61 Mastitis without abscess: Secondary | ICD-10-CM | POA: Insufficient documentation

## 2019-12-17 MED ORDER — DEXTROSE 5 % IV SOLN
1500.0000 mg | Freq: Once | INTRAVENOUS | Status: AC
  Administered 2019-12-17: 11:00:00 1500 mg via INTRAVENOUS
  Filled 2019-12-17: qty 75

## 2019-12-22 DIAGNOSIS — Z23 Encounter for immunization: Secondary | ICD-10-CM | POA: Diagnosis not present

## 2020-01-03 ENCOUNTER — Other Ambulatory Visit: Payer: Self-pay | Admitting: *Deleted

## 2020-01-03 ENCOUNTER — Ambulatory Visit (INDEPENDENT_AMBULATORY_CARE_PROVIDER_SITE_OTHER): Payer: 59 | Admitting: Infectious Diseases

## 2020-01-03 ENCOUNTER — Encounter: Payer: Self-pay | Admitting: Infectious Diseases

## 2020-01-03 ENCOUNTER — Other Ambulatory Visit: Payer: Self-pay

## 2020-01-03 DIAGNOSIS — N61 Mastitis without abscess: Secondary | ICD-10-CM | POA: Diagnosis not present

## 2020-01-03 DIAGNOSIS — C50411 Malignant neoplasm of upper-outer quadrant of right female breast: Secondary | ICD-10-CM

## 2020-01-03 NOTE — Assessment & Plan Note (Signed)
She appears to be doing well. Will see her back as needed- f/c, increased erythema, tenderness.  Will see her asap, prn.  She will f/u with her plastic surgeon in May, she is considering having her prosthesis removed.  She will f/u with Oncology this month.

## 2020-01-03 NOTE — Progress Notes (Signed)
   Subjective:    Patient ID: Katelyn Shelton, female    DOB: [birth year], 44 y.o.   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 1996. She had previous infection of her L elbow. Her course was then complicated by R breast cancer, surgery 02-2017 (+LN), double mastectomy and double breast implants done 11-25-2018.  Has had CTX (June to Dec 2018) followed by XRT (6 weeks). Recurence free. She first noted erythema and tenderness over last 24h. She noted tightness, esp after stretching, running and being on the floor.  She was started on cipro by her plastic surgery 2-10. She was seen in ID on 2-11 and given 24h of zyvox then a single dose of oritavancin on 2-12.  She was seen in ID on 2-17 and some concerns about residual erythema.  She had repeat dose of oritavancin 2-22.  Today she feels better. No erythema. Has mild tenderness, dicsomfort which she attributes to capsular contraction. She feels like this has been ongoing over the last few months, prosthesis was placed ~ 1 year ago.  Has occas pulsing pain.   Review of Systems  Constitutional: Negative for chills and fever.  Respiratory: Positive for chest tightness.   Please see HPI. All other systems reviewed and negative.      Objective:   Physical Exam Constitutional:      Appearance: Normal appearance.  Chest:    Neurological:     Mental Status: She is alert.           Assessment & Plan:

## 2020-01-04 NOTE — Progress Notes (Signed)
[facility]  [phone] [phone]     ID: Katelyn Shelton DOB: 08-01-1976  MR#: [id number]  BVQ#:[id number]  Patient Care Team: Katelyn Olp, MD as PCP - General (Family Medicine) Magrinat, Virgie Dad, MD as Consulting Physician (Oncology) Katelyn Bookbinder, MD as Consulting Physician (General Surgery) Katelyn Shorten, MD as Consulting Physician (Obstetrics and Gynecology) Katelyn Craver Gerrie Nordmann, MD as Referring Physician (Hematology and Oncology) Katelyn Rudd, MD as Consulting Physician (Radiation Oncology) Katelyn Limbo, MD as Consulting Physician (Plastic Surgery) Shelton, Katelyn Goltz, MD as Referring Physician (Hematology and Oncology) Katelyn Shelton as Consulting Physician (Internal Medicine) Katelyn Ducking, MD as Consulting Physician (Neurology) Katelyn Spark, MD as Consulting Physician (Cardiology) OTHER MD:  CHIEF COMPLAINT: Estrogen receptor positive breast cancer  CURRENT TREATMENT: Exemestane  INTERVAL HISTORY: Katelyn Shelton is here today for follow up of her estrogen receptor positive breast cancer.  At her last appointment she was taking Anastrozole and struggling with increased aches and pains.  We sent in Exemestane instead.  She notes that the achiness isn't overwhelming better, however it isn't any worse.  She is walking some, but isn't exercising like she did prior to COVID 19.    Katelyn Shelton also receives Prolia every 6 months with labs prior.  She receives this in the clinic instead of the infusion suite.    REVIEW OF SYSTEMS: Katelyn Shelton is doing moderately well.  She notes that over her right breast, her implant has become tighter, and consistent with capsular contracture.  She notes that she developed a significant right breast infection in February, requiring infectious disease consultation with Dr. Johnnye Shelton.  She underwent a couple of rounds of IV antibiotics.  She notes her breast is improved, however the implant is tight and she is concerned  about the potentiality of another infection. She wants to know more about having the implant removed and having different reconstruction.    Katelyn Shelton denies any new issues such as fever, chills, chest pain, or palpitations.  She is without nausea, vomiting, bowel/bladder changes.  She denies any new issues and a detailed ROS was otherwise non contributory.    BREAST CANCER HISTORY: From the original intake note:  "Katelyn Shelton" saw Dr. Corinna Shelton for routine follow-up and was found to have a palpable mass in the upper outer quadrant of her right breast and was set up for bilateral diagnostic mammography with tomography and right breast ultrasonography at the [facility] 02/08/2017. The breast density was category C. In the upper outer quadrant of the right breast there was an irregular mass. There was also a 0.3 cm group of slightly heterogeneous calcifications in the retroareolar left breast. On exam there was indeed a firm palpable mass at the 10:00 position of the right breast 4 cm from the nipple. On ultrasound this measured 3.5 cm, it was irregular and hypoechoic. There was no abnormal right axillary adenopathy.  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 The area of left breast calcifications was biopsied 02/09/2017 and showed only fibrocystic changes. ([id number]).  Her subsequent history is as detailed below.  PAST MEDICAL HISTORY: Past Medical History:  Diagnosis Date  . Allergy   . Anxiety    venflafaxine XR 75 mg  . Breast cancer ([place]) 01/2017   right; genetic testing negative in 2018 with [facility]  panel  . Heart murmur    as child- went away  . History of MRSA infection    elbow  . Hyperlipidemia   . Joint pain    due to medicine induced joint pain  . Migraine    otc med  prn  . Miscarriage    x1  . Neuromuscular disorder ([place])    neuropathy bilater feet - ? 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 Osteopenia   . UTI (urinary tract infection)     PAST SURGICAL HISTORY: Past Surgical History:  Procedure Laterality Date  . AREOLA/NIPPLE RECONSTRUCTION WITH GRAFT Right 12/01/2018   Procedure: RIGHT NIPPLE AREOLA COMPLEX RECONSTRUCTION WITH FULL THICKNESS SKIN GRAFT FROM RIGHT GROIN;  Surgeon: Katelyn Limbo, MD;  Location: [place];  Service: Plastics;  Laterality: Right;  . BREAST RECONSTRUCTION Left 12/01/2018   Procedure: LEFT BREAST REVISION RECONSTRUCTION WITH SKIN EXCISION;  Surgeon: Katelyn Limbo, MD;  Location: [place];  Service: Plastics;  Laterality: Left;  . BREAST RECONSTRUCTION Left 12/01/2018   Procedure: COMPOSITE GRAFT FROM LEFT NIPPLE (NIPPLE SHARING);  Surgeon: Katelyn Limbo, MD;  Location: [place];  Service: Plastics;  Laterality: Left;  . BREAST RECONSTRUCTION WITH PLACEMENT OF TISSUE EXPANDER AND FLEX HD (ACELLULAR HYDRATED DERMIS) Bilateral 02/28/2017   Procedure: BILATERAL BREAST RECONSTRUCTION WITH PLACEMENT OF TISSUE EXPANDER AND ALLODERM;  Surgeon: Katelyn Limbo, MD;  Location: [place];  Service: Plastics;  Laterality: Bilateral;  . CESAREAN SECTION  2008,2010,2013   x 3  . CESAREAN SECTION  02/04/2012   Procedure: CESAREAN SECTION;  Surgeon: Katelyn Mourning, MD;  Location: [facility];  Service: Gynecology;  Laterality: N/A;  Repeat Cesarean Section Delivery  Boy  @  [phone], Apgars 9/10  . COLONOSCOPY     polyp  . DILATION AND CURETTAGE OF UTERUS    . EXCISION OF BREAST LESION Right 03/15/2017   Procedure: RIGHT NIPPLE AND AREOLA EXCISION;  Surgeon: Katelyn Bookbinder, MD;  Location: [place];  Service: General;  Laterality: Right;  . LAPAROSCOPIC VAGINAL HYSTERECTOMY WITH SALPINGO OOPHORECTOMY Bilateral 06/29/2018   Procedure: LAPAROSCOPIC ASSISTED VAGINAL HYSTERECTOMY  WITH SALPINGO OOPHORECTOMY;  Surgeon: Katelyn Shorten, MD;  Location: [facility];  Service: Gynecology;  Laterality: Bilateral;  Request case for [facility] CRNA  . LIPOSUCTION WITH LIPOFILLING N/A 07/25/2018   Procedure: LIPOSUCTION WITH LIPOFILLING FROM ABDOMEN TO CHEST;  Surgeon: Katelyn Limbo, MD;  Location: [place];  Service: Plastics;  Laterality: N/A;  . LIPOSUCTION WITH LIPOFILLING Bilateral 12/01/2018   Procedure: LIPOFILLING TO BILATERAL CHEST REVISION;  Surgeon: Katelyn Limbo, MD;  Location: [place];  Service: Plastics;  Laterality: Bilateral;  . NIPPLE SPARING MASTECTOMY Bilateral 02/28/2017   Procedure: RIGHT NIPPLE SPARING MASTECTOMY; LEFT PROPHYLACTIC NIPPLE SPARING MASTECTOMY;  Surgeon: Katelyn Bookbinder, MD;  Location: [place];  Service: General;  Laterality: Bilateral;  . PECTUS EXCAVATUM REPAIR  592   44 years old- very large implant  . REMOVAL OF TISSUE EXPANDER AND PLACEMENT OF IMPLANT Bilateral 07/25/2018   Procedure: REMOVAL OF TISSUE EXPANDER AND PLACEMENT OF SILICONE IMPLANT;  Surgeon: Katelyn Limbo, MD;  Location: [place];  Service: Plastics;  Laterality: Bilateral;  . SENTINEL NODE BIOPSY Right 02/28/2017   Procedure: RIGHT AXILLARY SENTINEL LYMPH  NODE BIOPSY;  Surgeon: Katelyn Bookbinder, MD;  Location: [place];  Service: General;  Laterality: Right;  . VULVAR LESION REMOVAL    . WISDOM TOOTH EXTRACTION  FAMILY HISTORY Family History  Problem Relation Age of Onset  . Skin cancer Mother   . Hyperlipidemia Mother   . Hypertension Mother   . Colon polyps Mother   . Hyperlipidemia Father   . Parkinson's disease Sister 59       early on set  . Miscarriages / Stillbirths Sister   . Diabetes Maternal Aunt   . Colon polyps Maternal Uncle   . Heart failure Maternal Grandmother   . Stroke Maternal Grandmother   . Heart attack Maternal Grandfather   . Depression Maternal  Grandfather   . Early death Maternal Grandfather        37 MI  . Stomach cancer Paternal Grandmother   . Prostate cancer Paternal Grandfather   . Alzheimer's disease Paternal Grandfather   . Breast cancer Cousin        mother's paternal first FEMALE cousin  . Colon polyps Sister   . Colon cancer Neg Hx   . Rectal cancer Neg Hx    The patient's father, Katelyn Shelton, is an Administrator, Civil Service, currently 43 years old. The patient's mother is 42 years old as of April 2018. Patient has no brothers, 2 sisters. There is a history breast cancer in the family in a great aunt on the patient's father's side. On the maternal side there is a female first cousin of the patient's mother with breast cancer. There is no history of ovarian cancer in the family  GYNECOLOGIC HISTORY:  Patient's last menstrual period was 09/01/2017. Katelyn Shelton had her first menstrual period age 70, her first live birth age 63. She has had 3 C-sections. Her periods are approximately every 21 days, with one heavy day. Her husband is status post vasectomy.  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 Her husband [name] is one of our cardiologists. The patient attends [employer] [place] DIRECTIVES:    HEALTH MAINTENANCE: Social History   Tobacco Use  . Smoking status: Never Smoker  . Smokeless tobacco: Never Used  Substance Use Topics  . Alcohol use: Yes    Alcohol/week: 1.0 - 2.0 standard drinks    Types: 1 - 2 Standard drinks or equivalent per week    Comment: occasional wine - twice monthly  . Drug use: No     Colonoscopy:  PAP:  Bone density: 11/25/2017 showed a T score of -2.0 osteopenic   Allergies  Allergen Reactions  . Fentanyl Itching    Severe itching - Patient refuses this medication  . Peach Flavor Anaphylaxis  . Penicillins Hives    AS A CHILD  . Sulfa Antibiotics Nausea And Vomiting  . Sulfasalazine Nausea And Vomiting and Other (See  Comments)  . Latex Itching    Current Outpatient Medications  Medication Sig Dispense Refill  . Calcium Carbonate (CALCIUM 600 PO) Take 1 capsule by mouth daily.     [name] denosumab (PROLIA) 60 MG/ML SOSY injection Inject 60 mg into the skin every 6 (six) months.    . Evolocumab (REPATHA SURECLICK) 676 MG/ML SOAJ Inject 1 pen into the skin every 14 (fourteen) days. 2 pen 11  . exemestane (AROMASIN) 25 MG tablet TAKE 1 TABLET (25 MG TOTAL) BY MOUTH DAILY AFTER BREAKFAST. 90 tablet 3  . Multiple Vitamins-Minerals (MULTIVITAMIN WOMEN 50+) TABS Take 1 tablet by mouth daily.     [name] venlafaxine XR (EFFEXOR-XR) 37.5 MG 24 hr capsule TAKE 1 CAPSULE BY MOUTH DAILY WITH BREAKFAST  90 capsule 4   Current Facility-Administered Medications  Medication Dose Route Frequency Provider Last Rate Last Admin  . denosumab (PROLIA) injection 60 mg  60 mg Subcutaneous Once Gardenia Phlegm, NP        OBJECTIVE:  Vitals:   01/07/20 0925  BP: (!) 108/52  Pulse: 69  Resp: 18  Temp: 97.9 F (36.6 C)  SpO2: 100%   Body mass index is 26.34 kg/m.  ECOG FS:1 - Symptomatic but completely ambulatory  GENERAL: Patient is a well appearing female in no acute distress HEENT:  Sclerae anicteric.  Oropharynx clear and moist. No ulcerations or evidence of oropharyngeal candidiasis. Neck is supple.  NODES:  No cervical, supraclavicular, or axillary lymphadenopathy palpated.  BREAST EXAM:  S/p bilateral mastectomies with implant placement.  No sign of local recurrence noted, right breast is tight LUNGS:  Clear to auscultation bilaterally.  No wheezes or rhonchi. HEART:  Regular rate and rhythm. No murmur appreciated. ABDOMEN:  Soft, nontender.  Positive, normoactive bowel sounds. No organomegaly palpated. MSK:  No focal spinal tenderness to palpation. Full range of motion bilaterally in the upper extremities. EXTREMITIES:  No peripheral edema.   SKIN:  Clear with no obvious rashes or skin changes. No nail  dyscrasia. NEURO:  Nonfocal. Well oriented.  Appropriate affect.    LAB RESULTS:  CMP     Component Value Date/Time   NA 141 01/07/2020 0900   NA 140 08/19/2017 1337   K 4.3 01/07/2020 0900   K 3.6 08/19/2017 1337   CL 105 01/07/2020 0900   CO2 27 01/07/2020 0900   CO2 24 08/19/2017 1337   GLUCOSE 89 01/07/2020 0900   GLUCOSE 88 08/19/2017 1337   BUN 13 01/07/2020 0900   BUN 10.9 08/19/2017 1337   CREATININE 0.76 01/07/2020 0900   CREATININE 0.7 08/19/2017 1337   CALCIUM 9.4 01/07/2020 0900   CALCIUM 9.0 08/19/2017 1337   PROT 7.6 01/07/2020 0900   PROT 7.0 06/22/2018 0817   PROT 6.6 08/19/2017 1337   ALBUMIN 4.2 01/07/2020 0900   ALBUMIN 4.6 06/22/2018 0817   ALBUMIN 3.9 08/19/2017 1337   AST 25 01/07/2020 0900   AST 35 (H) 08/19/2017 1337   ALT 24 01/07/2020 0900   ALT 55 08/19/2017 1337   ALKPHOS 40 01/07/2020 0900   ALKPHOS 61 08/19/2017 1337   BILITOT 0.5 01/07/2020 0900   BILITOT 0.41 08/19/2017 1337   GFRNONAA >60 01/07/2020 0900   GFRAA >60 01/07/2020 0900    No results found for: TOTALPROTELP, ALBUMINELP, A1GS, A2GS, BETS, BETA2SER, GAMS, MSPIKE, SPEI  No results found for: Nils Pyle, [facility]  Lab Results  Component Value Date   WBC 6.1 01/07/2020   NEUTROABS 3.7 01/07/2020   HGB 14.7 01/07/2020   HCT 43.7 01/07/2020   MCV 96.7 01/07/2020   PLT 223 01/07/2020      Chemistry      Component Value Date/Time   NA 141 01/07/2020 0900   NA 140 08/19/2017 1337   K 4.3 01/07/2020 0900   K 3.6 08/19/2017 1337   CL 105 01/07/2020 0900   CO2 27 01/07/2020 0900   CO2 24 08/19/2017 1337   BUN 13 01/07/2020 0900   BUN 10.9 08/19/2017 1337   CREATININE 0.76 01/07/2020 0900   CREATININE 0.7 08/19/2017 1337      Component Value Date/Time   CALCIUM 9.4 01/07/2020 0900   CALCIUM 9.0 08/19/2017 1337   ALKPHOS 40 01/07/2020 0900   ALKPHOS 61 08/19/2017 1337   AST  25 01/07/2020 0900   AST 35 (H) 08/19/2017 1337   ALT 24 01/07/2020  0900   ALT 55 08/19/2017 1337   BILITOT 0.5 01/07/2020 0900   BILITOT 0.41 08/19/2017 1337       No results found for: LABCA2  No components found for: [id number]  No results for input(s): INR in the last 168 hours.  Urinalysis    Component Value Date/Time   COLORURINE YELLOW 08/26/2011 1652   APPEARANCEUR CLEAR 08/26/2011 1652   LABSPEC 1.025 12/21/2013 1226   PHURINE 6.0 12/21/2013 1226   GLUCOSEU NEGATIVE 12/21/2013 1226   HGBUR TRACE (A) 12/21/2013 1226   BILIRUBINUR Negative 05/16/2018 1129   KETONESUR NEGATIVE 12/21/2013 1226   PROTEINUR Negative 05/16/2018 1129   PROTEINUR NEGATIVE 12/21/2013 1226   UROBILINOGEN 0.2 05/16/2018 1129   UROBILINOGEN 0.2 12/21/2013 1226   NITRITE Negative 05/16/2018 1129   NITRITE NEGATIVE 12/21/2013 1226   LEUKOCYTESUR Moderate (2+) (A) 05/16/2018 1129     STUDIES: She underwent a total hysterectomy on 06/29/2018. She is due for a Chest CT in December 2019.  ELIGIBLE FOR AVAILABLE RESEARCH PROTOCOL: no  ASSESSMENT: 44 y.o. [place] woman status post right breast upper outer quadrant biopsy 02/08/2017 for a clinical T2 N0, stage IB invasive ductal carcinoma, grade 2, estrogen and progesterone receptor positive, HER-2 nonamplified, with an MIB-1 of 5%.  (a) biopsy of upper outer quadrant calcifications in the left breast 02/09/2017 were benign  (1) Oncotype DX obtained from the biopsy sample showed a recurrence score of 11 predicting a 10 year risk of recurrence outside the breast of 9% if the patient's only systemic therapy is tamoxifen for 5 years (node positive report)  (2) bilateral mastectomies with right sentinel lymph node sampling 02/28/2017 showed  (a) on the left, intraductal papilloma, with no evidence of malignancy.  (b) on the right, a pT2 pN1 stage IIA invasive ductal carcinoma, grade 1, with close margins  (c) additional excision of the right nipple areolar area 03/15/2017 found no residual carcinoma  (d) status post  bilateral definitive silicone implant placement 07/25/2018  (3) Mammaprint study on the final surgical sample came back low risk, predicting a five-year distant disease-free survival of 97.8% with hormone therapy alone   (3) tamoxifen started neoadjuvantly 02/11/2017, Stopped 04/11/2017 in preparation for chemotherapy  (4) genetics counseling 02/19/2017 showed no deleterious mutations in the STAT gene panel offered by [facility] including ATM, BRCA1, BRCA2, CDH1, CHEK2, PALB2, PTEN, STK11, and TP53.  (a) multi gene panel February 21, 2017 through the  Multi-Gene Panel offered by [facility] found no deleterious mutations in ALK, APC, ATM, AXIN2,BAP1,  BARD1, BLM, BMPR1A, BRCA1, BRCA2, BRIP1, CASR, CDC73, CDH1, CDK4, CDKN1B, CDKN1C, CDKN2A (p14ARF), CDKN2A (p16INK4a), CEBPA, CHEK2, CTNNA1, DICER1, DIS3L2, EGFR (c.2369C>T, p.Thr790Met variant only), EPCAM (Deletion/duplication testing only), FH, FLCN, GATA2, GPC3, GREM1 (Promoter region deletion/duplication testing only), HOXB13 (c.251G>A, p.Gly84Glu), HRAS, KIT, MAX, MEN1, MET, MITF (c.952G>A, p.Glu318Lys variant only), MLH1, MSH2, MSH3, MSH6, MUTYH, NBN, NF1, NF2, NTHL1, PALB2, PDGFRA, PHOX2B, PMS2, POLD1, POLE, POT1, PRKAR1A, PTCH1, PTEN, RAD50, RAD51C, RAD51D, RB1, RECQL4, RET, RUNX1, SDHAF2, SDHA (sequence changes only), SDHB, SDHC, SDHD, SMAD4, SMARCA4, SMARCB1, SMARCE1, STK11, SUFU, TERT, TERT, TMEM127, TP53, TSC1, TSC2, VHL, WRN and WT1.   (5) adjuvant chemotherapy consisting of cyclophosphamide and docetaxel given every 21 days 4 started 04/15/2017, completed August 03, 2017 at [facility]  (6) adjuvant radiation completed 09/30/2017  (7) anastrozole started 10/25/2017  (a) ovarian suppression with monthly goserelin October 2018 through August 2019  (  b) bone density on 11/25/2017 showed a T score of -2.0 osteopenic  (c) status post hysterectomy and bilateral salpingo-oophorectomy 06/29/2018, with benign pathology  (d) denosumab/Prolia started  12/30/2017  (8) history of adrenal hyperplasia, 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  (9) significant hypercholesterolemia  (10) CT scan for cardiac scoring 03/30/2018 showed clustered nodules in the inferior right upper lobe.  This was felt to be most likely inflammatory.  Repeat CT of the chest on 10/10/2018 shows subpleural nodular scarring in the anterolateral right upper and middle lobes, unchanged from prior, compatible with radiation changes.  No findings suspicious for recurrent or metastatic disease.     PLAN: Katelyn Shelton is doing well today.  She is clinically without any signs of breast cancer recurrence.  She will continue on Exemestane daily and she is tolerating this moderately well.    It has been 2 years since her last bone density test that demonstrated osteopenia.  She is due for a repeat.  She will have this with her [facility]CP [facility].  She receives Prolia every 6 months to prevent further bone density decline.  She tolerates this well.  We discussed calcium, vitamin d, and weight bearing exercises.    Katelyn Shelton and I talked about her breast concern.  I placed a referral to [facility] for evaluation for autologous reconstruction.  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  She is going to f/u with [name] about this in May as well.  She knows to reach out to me, or to [name] if she develops any further concerns between now and then.    We discussed healthy diet and exercise in detail.    Katelyn Shelton will return in 6 months for labs, f/u with Dr. Jana Hakim, and Prolia.  She was recommended to continue with the appropriate pandemic precautions. She knows to call for any questions that may arise between now and her next appointment.  We are happy to see her sooner if needed.  Total encounter time: 30 minutes*   Wilber Bihari, NP  01/07/20 10:05 AM Medical Oncology and Hematology [facility] [address] [place], [place] [address] Tel. [phone]    Fax.  [phone]  *Total Encounter Time as defined by the Centers for Medicare and Medicaid Services includes, in addition to the face-to-face time of a patient visit (documented in the note above) non-face-to-face time: obtaining and reviewing outside history, ordering and reviewing medications, tests or procedures, care coordination (communications with other health care professionals or caregivers) and documentation in the medical record.

## 2020-01-07 ENCOUNTER — Other Ambulatory Visit: Payer: Self-pay

## 2020-01-07 ENCOUNTER — Inpatient Hospital Stay: Payer: 59 | Attending: Adult Health

## 2020-01-07 ENCOUNTER — Other Ambulatory Visit: Payer: 59

## 2020-01-07 ENCOUNTER — Encounter: Payer: Self-pay | Admitting: Adult Health

## 2020-01-07 ENCOUNTER — Ambulatory Visit: Payer: 59 | Admitting: Adult Health

## 2020-01-07 ENCOUNTER — Ambulatory Visit: Payer: 59

## 2020-01-07 ENCOUNTER — Inpatient Hospital Stay: Payer: 59

## 2020-01-07 ENCOUNTER — Inpatient Hospital Stay: Payer: 59 | Admitting: Adult Health

## 2020-01-07 VITALS — BP 108/52 | HR 69 | Temp 97.9°F | Resp 18 | Ht 61.0 in | Wt 139.4 lb

## 2020-01-07 DIAGNOSIS — M8580 Other specified disorders of bone density and structure, unspecified site: Secondary | ICD-10-CM | POA: Diagnosis not present

## 2020-01-07 DIAGNOSIS — Z90710 Acquired absence of both cervix and uterus: Secondary | ICD-10-CM | POA: Diagnosis not present

## 2020-01-07 DIAGNOSIS — E785 Hyperlipidemia, unspecified: Secondary | ICD-10-CM | POA: Diagnosis not present

## 2020-01-07 DIAGNOSIS — Z9079 Acquired absence of other genital organ(s): Secondary | ICD-10-CM | POA: Insufficient documentation

## 2020-01-07 DIAGNOSIS — Z8349 Family history of other endocrine, nutritional and metabolic diseases: Secondary | ICD-10-CM | POA: Diagnosis not present

## 2020-01-07 DIAGNOSIS — E7800 Pure hypercholesterolemia, unspecified: Secondary | ICD-10-CM | POA: Insufficient documentation

## 2020-01-07 DIAGNOSIS — Z8042 Family history of malignant neoplasm of prostate: Secondary | ICD-10-CM | POA: Diagnosis not present

## 2020-01-07 DIAGNOSIS — Z79899 Other long term (current) drug therapy: Secondary | ICD-10-CM | POA: Insufficient documentation

## 2020-01-07 DIAGNOSIS — Z170 Estrogen receptor positive status [ER+]: Secondary | ICD-10-CM | POA: Insufficient documentation

## 2020-01-07 DIAGNOSIS — Z9013 Acquired absence of bilateral breasts and nipples: Secondary | ICD-10-CM | POA: Diagnosis not present

## 2020-01-07 DIAGNOSIS — C50411 Malignant neoplasm of upper-outer quadrant of right female breast: Secondary | ICD-10-CM

## 2020-01-07 DIAGNOSIS — Z8249 Family history of ischemic heart disease and other diseases of the circulatory system: Secondary | ICD-10-CM | POA: Diagnosis not present

## 2020-01-07 DIAGNOSIS — F419 Anxiety disorder, unspecified: Secondary | ICD-10-CM | POA: Diagnosis not present

## 2020-01-07 DIAGNOSIS — Z90722 Acquired absence of ovaries, bilateral: Secondary | ICD-10-CM | POA: Diagnosis not present

## 2020-01-07 DIAGNOSIS — Z923 Personal history of irradiation: Secondary | ICD-10-CM | POA: Insufficient documentation

## 2020-01-07 DIAGNOSIS — M8588 Other specified disorders of bone density and structure, other site: Secondary | ICD-10-CM

## 2020-01-07 DIAGNOSIS — Z79811 Long term (current) use of aromatase inhibitors: Secondary | ICD-10-CM | POA: Diagnosis not present

## 2020-01-07 DIAGNOSIS — Z9221 Personal history of antineoplastic chemotherapy: Secondary | ICD-10-CM | POA: Insufficient documentation

## 2020-01-07 DIAGNOSIS — Z800 Family history of malignant neoplasm of digestive organs: Secondary | ICD-10-CM | POA: Diagnosis not present

## 2020-01-07 DIAGNOSIS — Z803 Family history of malignant neoplasm of breast: Secondary | ICD-10-CM | POA: Diagnosis not present

## 2020-01-07 DIAGNOSIS — Z833 Family history of diabetes mellitus: Secondary | ICD-10-CM | POA: Diagnosis not present

## 2020-01-07 LAB — CMP (CANCER CENTER ONLY)
ALT: 24 U/L (ref 0–44)
AST: 25 U/L (ref 15–41)
Albumin: 4.2 g/dL (ref 3.5–5.0)
Alkaline Phosphatase: 40 U/L (ref 38–126)
Anion gap: 9 (ref 5–15)
BUN: 13 mg/dL (ref 6–20)
CO2: 27 mmol/L (ref 22–32)
Calcium: 9.4 mg/dL (ref 8.9–10.3)
Chloride: 105 mmol/L (ref 98–111)
Creatinine: 0.76 mg/dL (ref 0.44–1.00)
GFR, Est AFR Am: >60 mL/min (ref >60)
GFR, Estimated: >60 mL/min (ref >60)
Glucose, Bld: 89 mg/dL (ref 70–99)
Potassium: 4.3 mmol/L (ref 3.5–5.1)
Sodium: 141 mmol/L (ref 135–145)
Total Bilirubin: 0.5 mg/dL (ref 0.3–1.2)
Total Protein: 7.6 g/dL (ref 6.5–8.1)

## 2020-01-07 LAB — CBC WITH DIFFERENTIAL (CANCER CENTER ONLY)
Abs Immature Granulocytes: 0.02 10*3/uL (ref 0.00–0.07)
Basophils Absolute: 0.1 10*3/uL (ref 0.0–0.1)
Basophils Relative: 1 %
Eosinophils Absolute: 0.1 10*3/uL (ref 0.0–0.5)
Eosinophils Relative: 2 %
HCT: 43.7 % (ref 36.0–46.0)
Hemoglobin: 14.7 g/dL (ref 12.0–15.0)
Immature Granulocytes: 0 %
Lymphocytes Relative: 25 %
Lymphs Abs: 1.5 10*3/uL (ref 0.7–4.0)
MCH: 32.5 pg (ref 26.0–34.0)
MCHC: 33.6 g/dL (ref 30.0–36.0)
MCV: 96.7 fL (ref 80.0–100.0)
Monocytes Absolute: 0.6 10*3/uL (ref 0.1–1.0)
Monocytes Relative: 11 %
Neutro Abs: 3.7 10*3/uL (ref 1.7–7.7)
Neutrophils Relative %: 61 %
Platelet Count: 223 10*3/uL (ref 150–400)
RBC: 4.52 MIL/uL (ref 3.87–5.11)
RDW: 12.2 % (ref 11.5–15.5)
WBC Count: 6.1 10*3/uL (ref 4.0–10.5)
nRBC: 0 % (ref 0.0–0.2)

## 2020-01-07 LAB — VITAMIN D 25 HYDROXY (VIT D DEFICIENCY, FRACTURES): Vit D, 25-Hydroxy: 50.71 ng/mL (ref 30–100)

## 2020-01-07 MED ORDER — DENOSUMAB 60 MG/ML ~~LOC~~ SOLN: 60.0000 mg | Freq: Once | SUBCUTANEOUS | Status: DC

## 2020-01-07 MED ORDER — DENOSUMAB 60 MG/ML ~~LOC~~ SOSY
PREFILLED_SYRINGE | SUBCUTANEOUS | Status: AC
  Filled 2020-01-07: qty 1

## 2020-01-07 MED ORDER — DENOSUMAB 60 MG/ML ~~LOC~~ SOSY
60.0000 mg | PREFILLED_SYRINGE | Freq: Once | SUBCUTANEOUS | Status: AC
  Administered 2020-01-07: 60 mg via SUBCUTANEOUS

## 2020-01-08 ENCOUNTER — Telehealth: Payer: Self-pay | Admitting: *Deleted

## 2020-01-08 ENCOUNTER — Telehealth: Payer: Self-pay | Admitting: Adult Health

## 2020-01-08 MED FILL — REPATHA SURECLICK 140 MG/ML: 140 | 28 days supply | Qty: 2 | Fill #8

## 2020-01-08 NOTE — Telephone Encounter (Signed)
Scheduled appts per 3/15 los. Pt is aware of new appt date and time.

## 2020-01-08 NOTE — Telephone Encounter (Signed)
Per Wilber Bihari, NP, made pt aware by pat personal vmail that her vitamin d level was at 54, which is good. Advised if there were any other concerns to call office

## 2020-01-15 ENCOUNTER — Other Ambulatory Visit: Payer: Self-pay | Admitting: Infectious Diseases

## 2020-01-15 ENCOUNTER — Telehealth: Payer: Self-pay

## 2020-01-15 DIAGNOSIS — N61 Mastitis without abscess: Secondary | ICD-10-CM

## 2020-01-15 MED ORDER — DOXYCYCLINE MONOHYDRATE 100 MG PO TABS: 100.0000 mg | ORAL_TABLET | Freq: Two times a day (BID) | ORAL | 0 refills | Status: AC

## 2020-01-15 MED FILL — VENLAFAXINE HCL ER 37.5 MG: 37.5 | 90 days supply | Qty: 90 | Fill #2

## 2020-01-15 NOTE — Telephone Encounter (Signed)
Patient called triage to report new discomfort around incision line where providers removed her nipple. Stated that there's no redness however there is new discomfort along incision line and she's worried that it could be the start of a new infection/remnants of the last. Stated that she's going out of town this weekend and would like treatment (if warranted) before leaving to avoid it worsening. Will forward to provider.   Deborha Moseley Lorita Officer, RN

## 2020-01-15 NOTE — Telephone Encounter (Signed)
Rx for doxy sent to [place].  13 days.

## 2020-01-16 ENCOUNTER — Other Ambulatory Visit: Payer: Self-pay | Admitting: *Deleted

## 2020-01-16 DIAGNOSIS — N61 Mastitis without abscess: Secondary | ICD-10-CM

## 2020-01-16 MED FILL — DOXYCYCLINE HYCLATE 100 MG: 100 | 14 days supply | Qty: 28 | Fill #0

## 2020-01-16 NOTE — Progress Notes (Signed)
Pharmacy spoke with Dr Johnnye Sima, obtained a verbal order for the doxy per patient. Not necessary to resend prescription at this time. Landis Gandy, RN

## 2020-01-21 ENCOUNTER — Telehealth: Payer: Self-pay | Admitting: *Deleted

## 2020-01-21 NOTE — Telephone Encounter (Signed)
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 Called pt and informed referral has been placed and discussed their process of scheduling pts. Received verbal understanding.

## 2020-01-23 ENCOUNTER — Telehealth: Payer: Self-pay | Admitting: *Deleted

## 2020-01-23 NOTE — Telephone Encounter (Signed)
Referral faxed to [facility] for pt to see Dr. Jacinto Reap. Reference release [id number] for referral.

## 2020-02-07 MED FILL — EXEMESTANE 25 MG TABLET: 25 | 90 days supply | Qty: 90 | Fill #2

## 2020-02-07 MED FILL — REPATHA SURECLICK 140 MG/ML: 140 | 28 days supply | Qty: 2 | Fill #9

## 2020-02-26 ENCOUNTER — Other Ambulatory Visit: Payer: Self-pay

## 2020-02-26 ENCOUNTER — Telehealth: Payer: Self-pay | Admitting: Family Medicine

## 2020-02-26 DIAGNOSIS — E2839 Other primary ovarian failure: Secondary | ICD-10-CM

## 2020-02-26 NOTE — Telephone Encounter (Signed)
Bone density ordered.

## 2020-02-26 NOTE — Telephone Encounter (Signed)
Pt would like an order placed for a bone density.  Please call her with any questions.

## 2020-03-17 MED FILL — REPATHA SURECLICK 140 MG/ML: 140 | 28 days supply | Qty: 2 | Fill #10

## 2020-03-28 ENCOUNTER — Telehealth: Payer: Self-pay | Admitting: *Deleted

## 2020-03-28 NOTE — Telephone Encounter (Signed)
Pt called to relate she has not received an appt with Dr. Abelino Derrick at [facility].  Called and spoke to Dr. Abelino Derrick scheduler and was provided with direct fax number to send records and referral at [phone]. Left vm for pt with this information.

## 2020-04-14 ENCOUNTER — Other Ambulatory Visit: Payer: Self-pay | Admitting: Cardiology

## 2020-04-14 MED FILL — REPATHA SURECLICK 140 MG/ML: 140 | 28 days supply | Qty: 2 | Fill #0

## 2020-04-29 MED FILL — VENLAFAXINE HCL ER 37.5 MG: 37.5 | 90 days supply | Qty: 90 | Fill #3

## 2020-04-29 MED FILL — EXEMESTANE 25 MG TABLET: 25 | 90 days supply | Qty: 90 | Fill #3

## 2020-04-30 ENCOUNTER — Telehealth: Payer: Self-pay | Admitting: *Deleted

## 2020-04-30 NOTE — Telephone Encounter (Signed)
Called [facility] for update on referral sent for pt to see Dr. Abelino Derrick. They will call pt with appt. MR faxed again to [phone]

## 2020-05-15 MED FILL — REPATHA SURECLICK 140 MG/ML: 140 | 28 days supply | Qty: 2 | Fill #1

## 2020-05-23 ENCOUNTER — Other Ambulatory Visit: Payer: Self-pay | Admitting: *Deleted

## 2020-05-23 ENCOUNTER — Telehealth: Payer: Self-pay | Admitting: *Deleted

## 2020-05-23 DIAGNOSIS — Z170 Estrogen receptor positive status [ER+]: Secondary | ICD-10-CM

## 2020-05-23 NOTE — Telephone Encounter (Signed)
Pt called with c/o right hip discomfort. Per Dr.Magring xray of right hip and pelvis ordered. Called pt with recommendations as well as directions given.  Denies further needs at this time.

## 2020-05-28 ENCOUNTER — Encounter: Payer: Self-pay | Admitting: Family Medicine

## 2020-05-28 ENCOUNTER — Ambulatory Visit
Admission: RE | Admit: 2020-05-28 | Discharge: 2020-05-28 | Disposition: A | Discharge status: Home / Self Care | Payer: 59 | Source: Ambulatory Visit | Attending: Family Medicine | Admitting: Family Medicine

## 2020-05-28 ENCOUNTER — Other Ambulatory Visit: Payer: Self-pay

## 2020-05-28 DIAGNOSIS — M8588 Other specified disorders of bone density and structure, other site: Secondary | ICD-10-CM | POA: Insufficient documentation

## 2020-05-28 DIAGNOSIS — Z780 Asymptomatic menopausal state: Secondary | ICD-10-CM | POA: Diagnosis not present

## 2020-05-28 DIAGNOSIS — E2839 Other primary ovarian failure: Secondary | ICD-10-CM

## 2020-05-29 ENCOUNTER — Ambulatory Visit (HOSPITAL_COMMUNITY)
Admission: RE | Admit: 2020-05-29 | Discharge: 2020-05-29 | Disposition: A | Discharge status: Home / Self Care | Payer: 59 | Source: Ambulatory Visit | Attending: Oncology | Admitting: Oncology

## 2020-05-29 DIAGNOSIS — M25551 Pain in right hip: Secondary | ICD-10-CM | POA: Diagnosis not present

## 2020-05-29 DIAGNOSIS — C50411 Malignant neoplasm of upper-outer quadrant of right female breast: Secondary | ICD-10-CM | POA: Insufficient documentation

## 2020-05-29 DIAGNOSIS — Z170 Estrogen receptor positive status [ER+]: Secondary | ICD-10-CM | POA: Diagnosis not present

## 2020-06-20 MED FILL — REPATHA SURECLICK 140 MG/ML: 140 | 28 days supply | Qty: 2 | Fill #2

## 2020-07-09 NOTE — Progress Notes (Signed)
Katelyn Shelton  [phone] [phone]     ID: Katelyn Shelton DOB: 16-Nov-1975  MR#: [id number]  UUV#:[id number]  Patient Care Team: Marin Olp, MD as PCP - General (Family Medicine) Hagar Sadiq, Virgie Dad, MD as Consulting Physician (Oncology) Rolm Bookbinder, MD as Consulting Physician (General Surgery) Louretta Shorten, MD as Consulting Physician (Obstetrics and Gynecology) Juanita Craver Gerrie Nordmann, MD as Referring Physician (Hematology and Oncology) Kyung Rudd, MD as Consulting Physician (Radiation Oncology) Irene Limbo, MD as Consulting Physician (Plastic Surgery) Sorscher, Danice Goltz, MD as Referring Physician (Hematology and Oncology) Muss, Demaris Callander as Consulting Physician (Internal Medicine) Kathrynn Ducking, MD as Consulting Physician (Neurology) Dorothy Spark, MD as Consulting Physician (Cardiology) OTHER MD:  CHIEF COMPLAINT: Estrogen receptor positive breast cancer (s/p bilateral mastectomies)  CURRENT TREATMENT: [Exemestane]   INTERVAL HISTORY: Katelyn Shelton returns today for follow up of her estrogen receptor positive breast cancer.   She continues on exemestane.  She as well as fatigue.  Is having significant side effects from this including arthralgias and myalgias, as well as fatigue.  She "hates" this medication.  Of course she had problems with tamoxifen as well.  Katelyn Shelton also receives Prolia every 6 months with labs prior.  She has a dose due today. Since her last visit, she underwent bone density screening on 05/28/2020 showing a T-score of -1.7, which is considered osteopenic.  This is improved  She also underwent right hip x-ray on 05/29/2020 for evaluation of right hip pain, showing: no acute displaced fracture or dislocation; stable sclerotic lesion in right acetabulum and proximal right femur.   REVIEW OF SYSTEMS: Katelyn Shelton has had infection related to her right reconstructed breast requiring intravenous antibiotics x2.  She has  significant capsular contracture in that area.  She wonders if he should have that breast redone, possibly with a D IEP flap and she has an appointment with Dr. Abelino Derrick at [facility] 07/21/2020 to discuss this.  At the same time she worries whether she really wants to have further surgery, whether the risk of infection would be greater or lesser, and whether it might be better to just live with the problem she has.  She remains physically very active and of course she has 3 children to take care of which keeps her on her toes.  Detailed review of systems today was otherwise stable   BREAST CANCER HISTORY: From the original intake note:  "Katelyn Shelton" saw Dr. Corinna Capra for routine follow-up and was found to have a palpable mass in the upper outer quadrant of her right breast and was set up for bilateral diagnostic mammography with tomography and right breast ultrasonography at the [facility] 02/08/2017. The breast density was category C. In the upper outer quadrant of the right breast there was an irregular mass. There was also a 0.3 cm group of slightly heterogeneous calcifications in the retroareolar left breast. On exam there was indeed a firm palpable mass at the 10:00 position of the right breast 4 cm from the nipple. On ultrasound this measured 3.5 cm, it was irregular and hypoechoic. There was no abnormal right axillary adenopathy.  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 The area of left breast calcifications was biopsied 02/09/2017 and showed only fibrocystic changes. ([id number] [id number]).  Her subsequent history is as detailed below.  PAST MEDICAL HISTORY: Past Medical History:  Diagnosis Date  . Allergy   . Anxiety    venflafaxine XR 75 mg  . Breast cancer ([place]) 01/2017   right;  genetic testing negative in 2018 with [facility] panel  . Heart murmur    as child- went away  . History of MRSA infection    elbow  . Hyperlipidemia   . Joint pain    due to medicine induced joint pain  . Migraine    otc med prn  . Miscarriage    x1  . Neuromuscular disorder ([place])    neuropathy bilater feet - ? 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 Osteopenia   . UTI (urinary tract infection)     PAST SURGICAL HISTORY: Past Surgical History:  Procedure Laterality Date  . AREOLA/NIPPLE RECONSTRUCTION WITH GRAFT Right 12/01/2018   Procedure: RIGHT NIPPLE AREOLA COMPLEX RECONSTRUCTION WITH FULL THICKNESS SKIN GRAFT FROM RIGHT GROIN;  Surgeon: Irene Limbo, MD;  Location: [place];  Service: Plastics;  Laterality: Right;  . BREAST RECONSTRUCTION Left 12/01/2018   Procedure: LEFT BREAST REVISION RECONSTRUCTION WITH SKIN EXCISION;  Surgeon: Irene Limbo, MD;  Location: [place];  Service: Plastics;  Laterality: Left;  . BREAST RECONSTRUCTION Left 12/01/2018   Procedure: COMPOSITE GRAFT FROM LEFT NIPPLE (NIPPLE SHARING);  Surgeon: Irene Limbo, MD;  Location: [place];  Service: Plastics;  Laterality: Left;  . BREAST RECONSTRUCTION WITH PLACEMENT OF TISSUE EXPANDER AND FLEX HD (ACELLULAR HYDRATED DERMIS) Bilateral 02/28/2017   Procedure: BILATERAL BREAST RECONSTRUCTION WITH PLACEMENT OF TISSUE EXPANDER AND ALLODERM;  Surgeon: Irene Limbo, MD;  Location: [place];  Service: Plastics;  Laterality: Bilateral;  . CESAREAN SECTION  2008,2010,2013   x 3  . CESAREAN SECTION  02/04/2012   Procedure: CESAREAN SECTION;  Surgeon: Cyril Mourning, MD;  Location: [facility];  Service: Gynecology;  Laterality: N/A;  Repeat Cesarean Section Delivery  Boy  @  [phone], Apgars 9/10  . COLONOSCOPY     polyp  . DILATION AND CURETTAGE OF UTERUS    . EXCISION OF BREAST LESION Right 03/15/2017   Procedure: RIGHT NIPPLE AND AREOLA EXCISION;  Surgeon: Rolm Bookbinder, MD;  Location: [place];  Service: General;  Laterality: Right;  . LAPAROSCOPIC VAGINAL HYSTERECTOMY WITH SALPINGO OOPHORECTOMY Bilateral 06/29/2018   Procedure: LAPAROSCOPIC ASSISTED VAGINAL HYSTERECTOMY WITH SALPINGO OOPHORECTOMY;  Surgeon: Louretta Shorten, MD;  Location: [facility];  Service: Gynecology;  Laterality: Bilateral;  Request case for [facility] CRNA  . LIPOSUCTION WITH LIPOFILLING N/A 07/25/2018   Procedure: LIPOSUCTION WITH LIPOFILLING FROM ABDOMEN TO CHEST;  Surgeon: Irene Limbo, MD;  Location: [place];  Service: Plastics;  Laterality: N/A;  . LIPOSUCTION WITH LIPOFILLING Bilateral 12/01/2018   Procedure: LIPOFILLING TO BILATERAL CHEST REVISION;  Surgeon: Irene Limbo, MD;  Location: [place];  Service: Plastics;  Laterality: Bilateral;  . NIPPLE SPARING MASTECTOMY Bilateral 02/28/2017   Procedure: RIGHT NIPPLE SPARING MASTECTOMY; LEFT PROPHYLACTIC NIPPLE SPARING MASTECTOMY;  Surgeon: Rolm Bookbinder, MD;  Location: [place];  Service: General;  Laterality: Bilateral;  . PECTUS EXCAVATUM REPAIR  2380   44 years old- very large implant  . REMOVAL OF TISSUE EXPANDER AND PLACEMENT OF IMPLANT Bilateral 07/25/2018   Procedure: REMOVAL OF TISSUE EXPANDER AND PLACEMENT OF SILICONE IMPLANT;  Surgeon: Irene Limbo, MD;  Location: [place];  Service: Plastics;  Laterality: Bilateral;  . SENTINEL NODE BIOPSY Right 02/28/2017   Procedure: RIGHT AXILLARY  SENTINEL LYMPH  NODE BIOPSY;  Surgeon: Rolm Bookbinder, MD;  Location: [place];  Service: General;  Laterality: Right;  . VULVAR LESION REMOVAL    . WISDOM TOOTH EXTRACTION      FAMILY HISTORY Family History  Problem Relation Age of Onset  . Skin cancer Mother   . Hyperlipidemia Mother   . Hypertension Mother   . Colon polyps Mother   . Hyperlipidemia Father   . Parkinson's disease Sister 34       early on set  .  Miscarriages / Stillbirths Sister   . Diabetes Maternal Aunt   . Colon polyps Maternal Uncle   . Heart failure Maternal Grandmother   . Stroke Maternal Grandmother   . Heart attack Maternal Grandfather   . Depression Maternal Grandfather   . Early death Maternal Grandfather        102 MI  . Stomach cancer Paternal Grandmother   . Prostate cancer Paternal Grandfather   . Alzheimer's disease Paternal Grandfather   . Breast cancer Cousin        mother's paternal first FEMALE cousin  . Colon polyps Sister   . Colon cancer Neg Hx   . Rectal cancer Neg Hx    The patient's father, Carolyne Fiscal, is an Administrator, Civil Service, currently 43 years old. The patient's mother is 62 years old as of April 2018. Patient has no brothers, 2 sisters. There is a history breast cancer in the family in a great aunt on the patient's father's side. On the maternal side there is a female first cousin of the patient's mother with breast cancer. There is no history of ovarian cancer in the family   GYNECOLOGIC HISTORY:  Patient's last menstrual period was 09/01/2017. Katelyn Shelton had her first menstrual period age 80, her first live birth age 71. She has had 3 C-sections. Her periods are approximately every 21 days, with one heavy day. Her husband is status post vasectomy.   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 Her husband [name] is one of our cardiologists. The patient attends [employer] DIRECTIVES: In the absence of any documentation to the contrary, the patient's spouse is their HCPOA.    HEALTH MAINTENANCE: Social History   Tobacco Use  . Smoking status: Never Smoker  . Smokeless tobacco: Never Used  Vaping Use  . Vaping Use: Never used  Substance Use Topics  . Alcohol use: Yes    Alcohol/week: 1.0 - 2.0 standard drink    Types: 1 - 2 Standard drinks or equivalent per week    Comment: occasional wine - twice monthly  . Drug use: No      Colonoscopy:  PAP:  Bone density: 11/25/2017 showed a T score of -2.0 osteopenic   Allergies  Allergen Reactions  . Fentanyl Itching    Severe itching - Patient refuses this medication  . Peach Flavor Anaphylaxis  . Penicillins Hives    AS A CHILD  . Sulfa Antibiotics Nausea And Vomiting  . Sulfasalazine Nausea And Vomiting and Other (See Comments)  . Latex Itching    Current Outpatient Medications  Medication Sig Dispense Refill  . Calcium Carbonate (CALCIUM 600 PO) Take 1 capsule by mouth daily.     [name] denosumab (PROLIA) 60 MG/ML SOSY injection Inject 60 mg into the skin every 6 (six) months.    [name] exemestane (AROMASIN) 25 MG tablet TAKE 1 TABLET (25 MG  TOTAL) BY MOUTH DAILY AFTER BREAKFAST. 90 tablet 3  . Multiple Vitamins-Minerals (MULTIVITAMIN WOMEN 50+) TABS Take 1 tablet by mouth daily.     [name] REPATHA SURECLICK 329 MG/ML SOAJ INJECT 1 PEN INTO THE SKIN EVERY 14 DAYS. 2 mL 11  . venlafaxine XR (EFFEXOR-XR) 37.5 MG 24 hr capsule TAKE 1 CAPSULE BY MOUTH DAILY WITH BREAKFAST 90 capsule 4   No current facility-administered medications for this visit.    OBJECTIVE: white woman in no acute distress Vitals:   07/10/20 1015  BP: (!) 111/58  Pulse: 64  Resp: 18  Temp: (!) 97 F (36.1 C)  SpO2: 99%   Body mass index is 27.1 kg/m.  ECOG FS:1 - Symptomatic but completely ambulatory   Sclerae unicteric, EOMs intact Wearing a mask No cervical or supraclavicular adenopathy Lungs no rales or rhonchi Heart regular rate and rhythm Abd soft, nontender, positive bowel sounds MSK no focal spinal tenderness, no upper extremity lymphedema Neuro: nonfocal, well oriented, appropriate affect Breasts: Status post bilateral mastectomies, with bilateral implant reconstruction.  On the right the nipple is asymmetric and the breast hangs very differently from the left.  There is also significant induration.  There is no evidence of infection or inflammation at this point.  The left breast  is also status post mastectomy and reconstruction, but is otherwise unremarkable.  Both axillae are benign.   LAB RESULTS:  CMP     Component Value Date/Time   NA 141 07/10/2020 0949   NA 140 08/19/2017 1337   K 4.1 07/10/2020 0949   K 3.6 08/19/2017 1337   CL 105 07/10/2020 0949   CO2 24 07/10/2020 0949   CO2 24 08/19/2017 1337   GLUCOSE 87 07/10/2020 0949   GLUCOSE 88 08/19/2017 1337   BUN 12 07/10/2020 0949   BUN 10.9 08/19/2017 1337   CREATININE 0.75 07/10/2020 0949   CREATININE 0.7 08/19/2017 1337   CALCIUM 9.7 07/10/2020 0949   CALCIUM 9.0 08/19/2017 1337   PROT 8.0 07/10/2020 0949   PROT 7.0 06/22/2018 0817   PROT 6.6 08/19/2017 1337   ALBUMIN 4.3 07/10/2020 0949   ALBUMIN 4.6 06/22/2018 0817   ALBUMIN 3.9 08/19/2017 1337   AST 25 07/10/2020 0949   AST 35 (H) 08/19/2017 1337   ALT 30 07/10/2020 0949   ALT 55 08/19/2017 1337   ALKPHOS 40 07/10/2020 0949   ALKPHOS 61 08/19/2017 1337   BILITOT 0.4 07/10/2020 0949   BILITOT 0.41 08/19/2017 1337   GFRNONAA >60 07/10/2020 0949   GFRAA >60 07/10/2020 0949    No results found for: TOTALPROTELP, ALBUMINELP, A1GS, A2GS, BETS, BETA2SER, GAMS, MSPIKE, SPEI  No results found for: Nils Pyle, [facility]  Lab Results  Component Value Date   WBC 5.4 07/10/2020   NEUTROABS 2.8 07/10/2020   HGB 14.7 07/10/2020   HCT 42.9 07/10/2020   MCV 95.3 07/10/2020   PLT 235 07/10/2020      Chemistry      Component Value Date/Time   NA 141 07/10/2020 0949   NA 140 08/19/2017 1337   K 4.1 07/10/2020 0949   K 3.6 08/19/2017 1337   CL 105 07/10/2020 0949   CO2 24 07/10/2020 0949   CO2 24 08/19/2017 1337   BUN 12 07/10/2020 0949   BUN 10.9 08/19/2017 1337   CREATININE 0.75 07/10/2020 0949   CREATININE 0.7 08/19/2017 1337      Component Value Date/Time   CALCIUM 9.7 07/10/2020 0949   CALCIUM 9.0 08/19/2017 1337   ALKPHOS  40 07/10/2020 0949   ALKPHOS 61 08/19/2017 1337   AST 25 07/10/2020 0949   AST 35  (H) 08/19/2017 1337   ALT 30 07/10/2020 0949   ALT 55 08/19/2017 1337   BILITOT 0.4 07/10/2020 0949   BILITOT 0.41 08/19/2017 1337       No results found for: LABCA2  No components found for: [id number]  No results for input(s): INR in the last 168 hours.  Urinalysis    Component Value Date/Time   COLORURINE YELLOW 08/26/2011 1652   APPEARANCEUR CLEAR 08/26/2011 1652   LABSPEC 1.025 12/21/2013 1226   PHURINE 6.0 12/21/2013 1226   GLUCOSEU NEGATIVE 12/21/2013 1226   HGBUR TRACE (A) 12/21/2013 1226   BILIRUBINUR Negative 05/16/2018 1129   KETONESUR NEGATIVE 12/21/2013 1226   PROTEINUR Negative 05/16/2018 1129   PROTEINUR NEGATIVE 12/21/2013 1226   UROBILINOGEN 0.2 05/16/2018 1129   UROBILINOGEN 0.2 12/21/2013 1226   NITRITE Negative 05/16/2018 1129   NITRITE NEGATIVE 12/21/2013 1226   LEUKOCYTESUR Moderate (2+) (A) 05/16/2018 1129    STUDIES: No results found.   ELIGIBLE FOR AVAILABLE RESEARCH PROTOCOL: no  ASSESSMENT: 44 y.o. [place] woman status post right breast upper outer quadrant biopsy 02/08/2017 for a clinical T2 N0, stage IB invasive ductal carcinoma, grade 2, estrogen and progesterone receptor positive, HER-2 nonamplified, with an MIB-1 of 5%.  (a) biopsy of upper outer quadrant calcifications in the left breast 02/09/2017 were benign  (1) Oncotype DX obtained from the biopsy sample showed a recurrence score of 11 predicting a 10 year risk of recurrence outside the breast of 9% if the patient's only systemic therapy is tamoxifen for 5 years (node positive report)  (2) bilateral mastectomies with right sentinel lymph node sampling 02/28/2017 showed  (a) on the left, intraductal papilloma, with no evidence of malignancy.  (b) on the right, a pT2 pN1 stage IIA invasive ductal carcinoma, grade 1, with close margins  (c) additional excision of the right nipple areolar area 03/15/2017 found no residual carcinoma  (d) status post bilateral definitive silicone  implant placement 07/25/2018  (3) Mammaprint study on the final surgical sample came back low risk, predicting a five-year distant disease-free survival of 97.8% with hormone therapy alone   (3) tamoxifen started neoadjuvantly 02/11/2017, Stopped 04/11/2017 in preparation for chemotherapy  (4) genetics counseling 02/19/2017 showed no deleterious mutations in the STAT gene panel offered by [facility] including ATM, BRCA1, BRCA2, CDH1, CHEK2, PALB2, PTEN, STK11, and TP53.  (a) multi gene panel February 21, 2017 through the  Multi-Gene Panel offered by [facility] found no deleterious mutations in ALK, APC, ATM, AXIN2,BAP1,  BARD1, BLM, BMPR1A, BRCA1, BRCA2, BRIP1, CASR, CDC73, CDH1, CDK4, CDKN1B, CDKN1C, CDKN2A (p14ARF), CDKN2A (p16INK4a), CEBPA, CHEK2, CTNNA1, DICER1, DIS3L2, EGFR (c.2369C>T, p.Thr790Met variant only), EPCAM (Deletion/duplication testing only), FH, FLCN, GATA2, GPC3, GREM1 (Promoter region deletion/duplication testing only), HOXB13 (c.251G>A, p.Gly84Glu), HRAS, KIT, MAX, MEN1, MET, MITF (c.952G>A, p.Glu318Lys variant only), MLH1, MSH2, MSH3, MSH6, MUTYH, NBN, NF1, NF2, NTHL1, PALB2, PDGFRA, PHOX2B, PMS2, POLD1, POLE, POT1, PRKAR1A, PTCH1, PTEN, RAD50, RAD51C, RAD51D, RB1, RECQL4, RET, RUNX1, SDHAF2, SDHA (sequence changes only), SDHB, SDHC, SDHD, SMAD4, SMARCA4, SMARCB1, SMARCE1, STK11, SUFU, TERT, TERT, TMEM127, TP53, TSC1, TSC2, VHL, WRN and WT1.   (5) adjuvant chemotherapy consisting of cyclophosphamide and docetaxel given every 21 days 4 started 04/15/2017, completed August 03, 2017 [facility]  (6) adjuvant radiation completed 09/30/2017  (7) anastrozole started 10/25/2017  (a) ovarian suppression with monthly goserelin October 2018 through August 2019  (b) bone  density on 11/25/2017 showed a T score of -2.0 osteopenic  (c) status post hysterectomy and bilateral salpingo-oophorectomy 06/29/2018, with benign pathology  (d) denosumab/Prolia started 12/30/2017  (e) repeat bone  density 05/28/2020 shows a T score of -1.7 (improved)  (8) history of adrenal hyperplasia, 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  (9) significant hypercholesterolemia  (10) CT scan for cardiac scoring 03/30/2018 showed clustered nodules in the inferior right upper lobe.  This was felt to be most likely inflammatory.    (a) repeat CT of the chest on 10/10/2018 shows subpleural nodular scarring in the anterolateral right upper and middle lobes, unchanged from prior, compatible with radiation changes.  No findings suspicious for recurrent or metastatic disease.     PLAN: Katelyn Shelton is now a little over 3 years out from definitive surgery for her breast cancer with no evidence of disease recurrence.  This is very favorable.  She is tolerating exemestane poorly.  It is affecting her quality of life significantly.  Recall she had problems with tamoxifen as well.  Given her intolerance of these medications I think we should try fulvestrant, which is inconvenient as she needs to come here to receive the treatment on a monthly basis but is generally much better tolerated.  In particular it is not associated with arthralgias myalgias or fatigue which are the main issue she is struggling with today.  We elected that she is going to stop exemestane now.  She will see me again in November.  If in November she is feeling considerably better off the exemestane then we will switch to fulvestrant.  If she really does not feel any better after 2 months off that medication we might as well stay on exemestane  I encouraged her to discuss reconstruction with Dr. Abelino Derrick but this is not an easy decision and she is also scheduled to see Dr. Donne Hazel within the next 24hours for another opinion on that matter  She will see me again in November and knows to call for any other issue that may develop before that visit.  Total encounter time 25 minutes.   Virgie Dad. Johney Perotti, MD  07/10/20 12:41 PM Medical Oncology and Hematology [facility] [place], [place] [address] Tel. [phone]    Fax. [phone]   I, Wilburn Mylar, am acting as scribe for Dr. Virgie Dad. Raychelle Hudman.  I, Lurline Del MD, have reviewed the above documentation for accuracy and completeness, and I agree with the above.    *Total Encounter Time as defined by the Centers for Medicare and Medicaid Services includes, in addition to the face-to-face time of a patient visit (documented in the note above) non-face-to-face time: obtaining and reviewing outside history, ordering and reviewing medications, tests or procedures, care coordination (communications with other health care professionals or caregivers) and documentation in the medical record.

## 2020-07-10 ENCOUNTER — Other Ambulatory Visit: Payer: Self-pay | Admitting: *Deleted

## 2020-07-10 ENCOUNTER — Inpatient Hospital Stay: Payer: 59 | Attending: Oncology | Admitting: Oncology

## 2020-07-10 ENCOUNTER — Inpatient Hospital Stay: Payer: 59

## 2020-07-10 ENCOUNTER — Other Ambulatory Visit: Payer: Self-pay

## 2020-07-10 VITALS — BP 111/58 | HR 64 | Temp 97.0°F | Resp 18 | Ht 61.0 in | Wt 143.4 lb

## 2020-07-10 DIAGNOSIS — T451X5A Adverse effect of antineoplastic and immunosuppressive drugs, initial encounter: Secondary | ICD-10-CM | POA: Diagnosis not present

## 2020-07-10 DIAGNOSIS — M2550 Pain in unspecified joint: Secondary | ICD-10-CM | POA: Diagnosis not present

## 2020-07-10 DIAGNOSIS — Z803 Family history of malignant neoplasm of breast: Secondary | ICD-10-CM | POA: Insufficient documentation

## 2020-07-10 DIAGNOSIS — Z170 Estrogen receptor positive status [ER+]: Secondary | ICD-10-CM

## 2020-07-10 DIAGNOSIS — F419 Anxiety disorder, unspecified: Secondary | ICD-10-CM | POA: Insufficient documentation

## 2020-07-10 DIAGNOSIS — Z87440 Personal history of urinary (tract) infections: Secondary | ICD-10-CM | POA: Diagnosis not present

## 2020-07-10 DIAGNOSIS — Z923 Personal history of irradiation: Secondary | ICD-10-CM | POA: Insufficient documentation

## 2020-07-10 DIAGNOSIS — E785 Hyperlipidemia, unspecified: Secondary | ICD-10-CM | POA: Insufficient documentation

## 2020-07-10 DIAGNOSIS — Z90710 Acquired absence of both cervix and uterus: Secondary | ICD-10-CM | POA: Insufficient documentation

## 2020-07-10 DIAGNOSIS — Z8614 Personal history of Methicillin resistant Staphylococcus aureus infection: Secondary | ICD-10-CM | POA: Insufficient documentation

## 2020-07-10 DIAGNOSIS — Z79811 Long term (current) use of aromatase inhibitors: Secondary | ICD-10-CM | POA: Insufficient documentation

## 2020-07-10 DIAGNOSIS — C50411 Malignant neoplasm of upper-outer quadrant of right female breast: Secondary | ICD-10-CM

## 2020-07-10 DIAGNOSIS — M8580 Other specified disorders of bone density and structure, unspecified site: Secondary | ICD-10-CM | POA: Diagnosis not present

## 2020-07-10 DIAGNOSIS — Z9013 Acquired absence of bilateral breasts and nipples: Secondary | ICD-10-CM | POA: Diagnosis not present

## 2020-07-10 DIAGNOSIS — Z79899 Other long term (current) drug therapy: Secondary | ICD-10-CM | POA: Diagnosis not present

## 2020-07-10 DIAGNOSIS — Z90722 Acquired absence of ovaries, bilateral: Secondary | ICD-10-CM | POA: Diagnosis not present

## 2020-07-10 DIAGNOSIS — Z9079 Acquired absence of other genital organ(s): Secondary | ICD-10-CM | POA: Diagnosis not present

## 2020-07-10 DIAGNOSIS — Z8042 Family history of malignant neoplasm of prostate: Secondary | ICD-10-CM | POA: Diagnosis not present

## 2020-07-10 DIAGNOSIS — Z8349 Family history of other endocrine, nutritional and metabolic diseases: Secondary | ICD-10-CM | POA: Diagnosis not present

## 2020-07-10 DIAGNOSIS — Z833 Family history of diabetes mellitus: Secondary | ICD-10-CM | POA: Diagnosis not present

## 2020-07-10 DIAGNOSIS — Z800 Family history of malignant neoplasm of digestive organs: Secondary | ICD-10-CM | POA: Insufficient documentation

## 2020-07-10 DIAGNOSIS — Z8249 Family history of ischemic heart disease and other diseases of the circulatory system: Secondary | ICD-10-CM | POA: Insufficient documentation

## 2020-07-10 DIAGNOSIS — E7800 Pure hypercholesterolemia, unspecified: Secondary | ICD-10-CM | POA: Insufficient documentation

## 2020-07-10 LAB — CMP (CANCER CENTER ONLY)
ALT: 30 U/L (ref 0–44)
AST: 25 U/L (ref 15–41)
Albumin: 4.3 g/dL (ref 3.5–5.0)
Alkaline Phosphatase: 40 U/L (ref 38–126)
Anion gap: 12 (ref 5–15)
BUN: 12 mg/dL (ref 6–20)
CO2: 24 mmol/L (ref 22–32)
Calcium: 9.7 mg/dL (ref 8.9–10.3)
Chloride: 105 mmol/L (ref 98–111)
Creatinine: 0.75 mg/dL (ref 0.44–1.00)
GFR, Est AFR Am: >60 mL/min (ref >60)
GFR, Estimated: >60 mL/min (ref >60)
Glucose, Bld: 87 mg/dL (ref 70–99)
Potassium: 4.1 mmol/L (ref 3.5–5.1)
Sodium: 141 mmol/L (ref 135–145)
Total Bilirubin: 0.4 mg/dL (ref 0.3–1.2)
Total Protein: 8 g/dL (ref 6.5–8.1)

## 2020-07-10 LAB — CBC WITH DIFFERENTIAL (CANCER CENTER ONLY)
Abs Immature Granulocytes: 0.04 10*3/uL (ref 0.00–0.07)
Basophils Absolute: 0.1 10*3/uL (ref 0.0–0.1)
Basophils Relative: 1 %
Eosinophils Absolute: 0.2 10*3/uL (ref 0.0–0.5)
Eosinophils Relative: 3 %
HCT: 42.9 % (ref 36.0–46.0)
Hemoglobin: 14.7 g/dL (ref 12.0–15.0)
Immature Granulocytes: 1 %
Lymphocytes Relative: 35 %
Lymphs Abs: 1.9 10*3/uL (ref 0.7–4.0)
MCH: 32.7 pg (ref 26.0–34.0)
MCHC: 34.3 g/dL (ref 30.0–36.0)
MCV: 95.3 fL (ref 80.0–100.0)
Monocytes Absolute: 0.5 10*3/uL (ref 0.1–1.0)
Monocytes Relative: 10 %
Neutro Abs: 2.8 10*3/uL (ref 1.7–7.7)
Neutrophils Relative %: 50 %
Platelet Count: 235 10*3/uL (ref 150–400)
RBC: 4.5 MIL/uL (ref 3.87–5.11)
RDW: 12.1 % (ref 11.5–15.5)
WBC Count: 5.4 10*3/uL (ref 4.0–10.5)
nRBC: 0 % (ref 0.0–0.2)

## 2020-07-10 LAB — VITAMIN D 25 HYDROXY (VIT D DEFICIENCY, FRACTURES): Vit D, 25-Hydroxy: 59.05 ng/mL (ref 30–100)

## 2020-07-10 MED ORDER — DENOSUMAB 60 MG/ML ~~LOC~~ SOSY
PREFILLED_SYRINGE | SUBCUTANEOUS | Status: AC
  Filled 2020-07-10: qty 1

## 2020-07-10 MED ORDER — DENOSUMAB 60 MG/ML ~~LOC~~ SOSY
60.0000 mg | PREFILLED_SYRINGE | Freq: Once | SUBCUTANEOUS | Status: AC
  Administered 2020-07-10: 60 mg via SUBCUTANEOUS
  Filled 2020-07-10: qty 1

## 2020-07-10 NOTE — Patient Instructions (Signed)
Denosumab injection °What is this medicine? °DENOSUMAB (den oh sue mab) slows bone breakdown. Prolia is used to treat osteoporosis in women after menopause and in men, and in people who are taking corticosteroids for 6 months or more. Xgeva is used to treat a high calcium level due to cancer and to prevent bone fractures and other bone problems caused by multiple myeloma or cancer bone metastases. Xgeva is also used to treat giant cell tumor of the bone. °This medicine may be used for other purposes; ask your health care provider or pharmacist if you have questions. °COMMON BRAND NAME(S): Prolia, XGEVA °What should I tell my health care provider before I take this medicine? °They need to know if you have any of these conditions: °· dental disease °· having surgery or tooth extraction °· infection °· kidney disease °· low levels of calcium or Vitamin D in the blood °· malnutrition °· on hemodialysis °· skin conditions or sensitivity °· thyroid or parathyroid disease °· an unusual reaction to denosumab, other medicines, foods, dyes, or preservatives °· pregnant or trying to get pregnant °· breast-feeding °How should I use this medicine? °This medicine is for injection under the skin. It is given by a health care professional in a hospital or clinic setting. °A special MedGuide will be given to you before each treatment. Be sure to read this information carefully each time. °For Prolia, talk to your pediatrician regarding the use of this medicine in children. Special care may be needed. For Xgeva, talk to your pediatrician regarding the use of this medicine in children. While this drug may be prescribed for children as young as 13 years for selected conditions, precautions do apply. °Overdosage: If you think you have taken too much of this medicine contact a poison control center or emergency room at once. °NOTE: This medicine is only for you. Do not share this medicine with others. °What if I miss a dose? °It is  important not to miss your dose. Call your doctor or health care professional if you are unable to keep an appointment. °What may interact with this medicine? °Do not take this medicine with any of the following medications: °· other medicines containing denosumab °This medicine may also interact with the following medications: °· medicines that lower your chance of fighting infection °· steroid medicines like prednisone or cortisone °This list may not describe all possible interactions. Give your health care provider a list of all the medicines, herbs, non-prescription drugs, or dietary supplements you use. Also tell them if you smoke, drink alcohol, or use illegal drugs. Some items may interact with your medicine. °What should I watch for while using this medicine? °Visit your doctor or health care professional for regular checks on your progress. Your doctor or health care professional may order blood tests and other tests to see how you are doing. °Call your doctor or health care professional for advice if you get a fever, chills or sore throat, or other symptoms of a cold or flu. Do not treat yourself. This drug may decrease your body's ability to fight infection. Try to avoid being around people who are sick. °You should make sure you get enough calcium and vitamin D while you are taking this medicine, unless your doctor tells you not to. Discuss the foods you eat and the vitamins you take with your health care professional. °See your dentist regularly. Brush and floss your teeth as directed. Before you have any dental work done, tell your dentist you are   receiving this medicine. Do not become pregnant while taking this medicine or for 5 months after stopping it. Talk with your doctor or health care professional about your birth control options while taking this medicine. Women should inform their doctor if they wish to become pregnant or think they might be pregnant. There is a potential for serious side  effects to an unborn child. Talk to your health care professional or pharmacist for more information. What side effects may I notice from receiving this medicine? Side effects that you should report to your doctor or health care professional as soon as possible:  allergic reactions like skin rash, itching or hives, swelling of the face, lips, or tongue  bone pain  breathing problems  dizziness  jaw pain, especially after dental work  redness, blistering, peeling of the skin  signs and symptoms of infection like fever or chills; cough; sore throat; pain or trouble passing urine  signs of low calcium like fast heartbeat, muscle cramps or muscle pain; pain, tingling, numbness in the hands or feet; seizures  unusual bleeding or bruising  unusually weak or tired Side effects that usually do not require medical attention (report to your doctor or health care professional if they continue or are bothersome):  constipation  diarrhea  headache  joint pain  loss of appetite  muscle pain  runny nose  tiredness  upset stomach This list may not describe all possible side effects. Call your doctor for medical advice about side effects. You may report side effects to FDA at 1-800-FDA-1088. Where should I keep my medicine? This medicine is only given in a clinic, doctor's office, or other health care setting and will not be stored at home. NOTE: This sheet is a summary. It may not cover all possible information. If you have questions about this medicine, talk to your doctor, pharmacist, or health care provider.  2020 Elsevier/Gold Standard (2018-02-17 16:10:44)

## 2020-07-11 ENCOUNTER — Other Ambulatory Visit: Payer: Self-pay | Admitting: General Surgery

## 2020-07-11 DIAGNOSIS — Z170 Estrogen receptor positive status [ER+]: Secondary | ICD-10-CM

## 2020-07-14 MED FILL — REPATHA SURECLICK 140 MG/ML: 140 | 28 days supply | Qty: 2 | Fill #3

## 2020-07-15 ENCOUNTER — Other Ambulatory Visit: Payer: Self-pay

## 2020-07-15 ENCOUNTER — Encounter: Payer: Self-pay | Admitting: Family Medicine

## 2020-07-15 ENCOUNTER — Ambulatory Visit
Admission: RE | Admit: 2020-07-15 | Discharge: 2020-07-15 | Disposition: A | Discharge status: Home / Self Care | Payer: 59 | Source: Ambulatory Visit | Attending: General Surgery | Admitting: General Surgery

## 2020-07-15 ENCOUNTER — Ambulatory Visit: Payer: 59 | Admitting: Family Medicine

## 2020-07-15 VITALS — BP 122/80 | HR 67 | Resp 18 | Ht 60.0 in | Wt 143.0 lb

## 2020-07-15 DIAGNOSIS — Z90710 Acquired absence of both cervix and uterus: Secondary | ICD-10-CM

## 2020-07-15 DIAGNOSIS — M25532 Pain in left wrist: Secondary | ICD-10-CM | POA: Diagnosis not present

## 2020-07-15 DIAGNOSIS — Z853 Personal history of malignant neoplasm of breast: Secondary | ICD-10-CM | POA: Diagnosis not present

## 2020-07-15 DIAGNOSIS — M654 Radial styloid tenosynovitis [de Quervain]: Secondary | ICD-10-CM | POA: Diagnosis not present

## 2020-07-15 DIAGNOSIS — C50411 Malignant neoplasm of upper-outer quadrant of right female breast: Secondary | ICD-10-CM | POA: Diagnosis not present

## 2020-07-15 MED ORDER — GADOBUTROL 1 MMOL/ML IV SOLN
6.0000 mL | Freq: Once | INTRAVENOUS | Status: AC | PRN
  Administered 2020-07-15: 6 mL via INTRAVENOUS

## 2020-07-15 MED FILL — DOXYCYCLINE HYCLATE 100 MG: 100 | 30 days supply | Qty: 60 | Fill #0

## 2020-07-15 NOTE — Progress Notes (Signed)
Phone [phone] In person visit   Subjective:   Katelyn Shelton is a 44 y.o. year old very pleasant female patient who presents for/with See problem oriented charting Chief Complaint  Patient presents with  . Left wrist pain   This visit occurred during the SARS-CoV-2 public health emergency.  Safety protocols were in place, including screening questions prior to the visit, additional usage of staff PPE, and extensive cleaning of exam room while observing appropriate contact time as indicated for disinfecting solutions.   Past Medical History-  Patient Active Problem List   Diagnosis Date Noted  . Malignant neoplasm of upper-outer quadrant of right breast in female, estrogen receptor positive ([place]) 02/11/2017    Priority: High  . Hyperlipidemia, familial, high LDL 01/16/2010    Priority: High  . Osteopenia of lumbar spine 05/28/2020    Priority: Medium  . Aromatase inhibitor-associated arthralgia 02/24/2018    Priority: Medium  . Enchondroma of bone 02/24/2018    Priority: Medium  . Anxiety     Priority: Medium  . Hidradenitis suppurativa 08/26/2011    Priority: Medium  . Family history of colonic polyps 02/24/2018    Priority: Low  . Genetic testing 02/21/2017    Priority: Low  . Family history of stomach cancer     Priority: Low  . Cellulitis of breast 12/06/2019  . History of total hysterectomy 06/29/2018  . Lesion of lung 04/05/2018  . Abnormal CT scan of lung 04/05/2018  . History of cancer 04/05/2018    Medications- reviewed and updated Current Outpatient Medications  Medication Sig Dispense Refill  . Calcium Carbonate (CALCIUM 600 PO) Take 1 capsule by mouth daily.     [name] denosumab (PROLIA) 60 MG/ML SOSY injection Inject 60 mg into the skin every 6 (six) months.    . doxycycline (VIBRA-TABS) 100 MG tablet Take 100 mg by mouth 2 (two) times daily.    [name] exemestane (AROMASIN) 25 MG tablet TAKE 1 TABLET (25 MG TOTAL) BY MOUTH DAILY AFTER BREAKFAST. 90 tablet 3  .  Multiple Vitamins-Minerals (MULTIVITAMIN WOMEN 50+) TABS Take 1 tablet by mouth daily.     [name] REPATHA SURECLICK 694 MG/ML SOAJ INJECT 1 PEN INTO THE SKIN EVERY 14 DAYS. 2 mL 11  . venlafaxine XR (EFFEXOR-XR) 37.5 MG 24 hr capsule TAKE 1 CAPSULE BY MOUTH DAILY WITH BREAKFAST 90 capsule 4   No current facility-administered medications for this visit.     Objective:  BP 122/80   Pulse 67   Resp 18   Ht 5' (1.524 m)   Wt 143 lb (64.9 kg)   LMP 09/01/2017   SpO2 97%   BMI 27.93 kg/m  Gen: NAD, resting comfortably CV: RRR  Lungs: nonlabored, normal respiratory rate Abdomen: soft/nondistended Skin: warm, dry MSK: possible small ganglion cyst over left radial side of wrist. Also positive Finkelstein test. Pain with resisted extension of thumb as well.     Assessment and Plan  Left wrist pain S: Patient stated that the pain started a month ago. She stated that there is a bump to her left wrist. Patient denies hitting it on anything.  Is worse with activity and worsening. Pepper grinder used last month seemed to bother it. Extending thumb out causes pain  Blood blisters on ibuprofen heavy use in past but feels could do short course of aleve A/P: May have a small ganglion cyst over the left radial side of wrist but also appears to have de Quervain's tenosynovitis.  Recommended thumb spica splint.  Will give handout from sports medicine advisor as well.  Recommend relative rest. Also aleve twice a day for 7 days take with food and consider pepcid if any upset stomach.   If no improvement in 7-10 days will reach out and we will refer to sports medicine  #HM note- does not need pap with total hysterectomy for benign reasons  # exercises regularly- no issues with 4 mets of activity- easily climbs flight of stairs in case we get any messages about surgery- would not need further cardiac clearance  Recommended follow up:  Keep January visit or sooner if needed Future Appointments  Date Time  Provider [place]  07/15/2020  4:50 PM [facility] MR 2 [facility] [address] [facility]  09/12/2020  9:00 AM [facility] None  09/12/2020  9:30 AM Magrinat, Virgie Dad, MD [facility] None  09/12/2020 10:15 AM [facility] [place] FLUSH [facility] None  11/18/2020 10:40 AM Yong Channel Brayton Mars, MD [facility]    Lab/Order associations:   ICD-10-CM   1. De Quervain's tenosynovitis, left  M65.4   2. Left wrist pain  M25.532   3. History of total hysterectomy  Z90.710    Time Spent: 20 minutes of total time (10:28 AM- 10:48 AM) was spent on the date of the encounter performing the following actions: chart review prior to seeing the patient, obtaining history, performing a medically necessary exam, counseling on the treatment plan, placing orders, and documenting in our EHR.   Return precautions advised.  Garret Reddish, MD

## 2020-07-15 NOTE — Patient Instructions (Addendum)
  May have a small ganglion cyst over the left radial side of wrist but also appears to have de Quervain's tenosynovitis.  Recommended thumb spica splint.  Will give handout from sports medicine advisor as well.  Recommend relative rest. Also aleve twice a day for 7 days take with food and consider pepcid if any upset stomach.   If no improvement in 7-10 days will reach out and we will refer to sports medicine  Recommended follow up:  Keep January visit or sooner if needed

## 2020-07-17 ENCOUNTER — Telehealth: Payer: Self-pay | Admitting: Oncology

## 2020-07-17 ENCOUNTER — Telehealth: Payer: Self-pay | Admitting: *Deleted

## 2020-07-17 DIAGNOSIS — Z9013 Acquired absence of bilateral breasts and nipples: Secondary | ICD-10-CM | POA: Diagnosis not present

## 2020-07-17 DIAGNOSIS — C50411 Malignant neoplasm of upper-outer quadrant of right female breast: Secondary | ICD-10-CM | POA: Diagnosis not present

## 2020-07-17 DIAGNOSIS — Z170 Estrogen receptor positive status [ER+]: Secondary | ICD-10-CM | POA: Diagnosis not present

## 2020-07-17 NOTE — Telephone Encounter (Signed)
Pt called stating after thinking over discussion with Dr. Jana Hakim regarding staying on exemestane vs fulvestrant injection, pt would like to continue on examestane. Pt request appts in November be cancelled. Msg sent to inform Dr. Jana Hakim of pt choice to stay on examestane. Msg sent to cancel appts in Nov and to scheduled f/u in March. No further needs voiced.

## 2020-07-17 NOTE — Telephone Encounter (Signed)
Scheduled appt per 9/23 sch msg - pt is aware of appt date and time

## 2020-07-21 ENCOUNTER — Other Ambulatory Visit: Payer: Self-pay | Admitting: Adult Health

## 2020-07-21 DIAGNOSIS — Z9013 Acquired absence of bilateral breasts and nipples: Secondary | ICD-10-CM | POA: Diagnosis not present

## 2020-07-21 DIAGNOSIS — M545 Low back pain, unspecified: Secondary | ICD-10-CM

## 2020-07-21 DIAGNOSIS — Z170 Estrogen receptor positive status [ER+]: Secondary | ICD-10-CM | POA: Diagnosis not present

## 2020-07-21 DIAGNOSIS — C50411 Malignant neoplasm of upper-outer quadrant of right female breast: Secondary | ICD-10-CM | POA: Diagnosis not present

## 2020-07-22 ENCOUNTER — Other Ambulatory Visit: Payer: Self-pay | Admitting: Adult Health

## 2020-07-22 MED FILL — VENLAFAXINE HCL ER 37.5 MG: 37.5 | 90 days supply | Qty: 90 | Fill #0

## 2020-07-29 DIAGNOSIS — Z20828 Contact with and (suspected) exposure to other viral communicable diseases: Secondary | ICD-10-CM | POA: Diagnosis not present

## 2020-08-07 ENCOUNTER — Other Ambulatory Visit: Payer: 59

## 2020-08-07 ENCOUNTER — Other Ambulatory Visit: Payer: Self-pay | Admitting: Oncology

## 2020-08-07 ENCOUNTER — Other Ambulatory Visit: Payer: Self-pay | Admitting: Adult Health

## 2020-08-07 DIAGNOSIS — Z170 Estrogen receptor positive status [ER+]: Secondary | ICD-10-CM

## 2020-08-07 MED FILL — REPATHA SURECLICK 140 MG/ML: 140 | 28 days supply | Qty: 2 | Fill #4

## 2020-08-07 MED FILL — EXEMESTANE 25 MG TABLET: 25 | 90 days supply | Qty: 90 | Fill #0

## 2020-08-08 DIAGNOSIS — Z20822 Contact with and (suspected) exposure to covid-19: Secondary | ICD-10-CM | POA: Diagnosis not present

## 2020-08-15 DIAGNOSIS — Z9013 Acquired absence of bilateral breasts and nipples: Secondary | ICD-10-CM | POA: Diagnosis not present

## 2020-08-15 DIAGNOSIS — Z170 Estrogen receptor positive status [ER+]: Secondary | ICD-10-CM | POA: Diagnosis not present

## 2020-08-15 DIAGNOSIS — C50411 Malignant neoplasm of upper-outer quadrant of right female breast: Secondary | ICD-10-CM | POA: Diagnosis not present

## 2020-09-02 MED FILL — REPATHA SURECLICK 140 MG/ML: 140 | 28 days supply | Qty: 2 | Fill #5

## 2020-09-11 DIAGNOSIS — C50411 Malignant neoplasm of upper-outer quadrant of right female breast: Secondary | ICD-10-CM | POA: Diagnosis not present

## 2020-09-12 ENCOUNTER — Other Ambulatory Visit: Payer: 59

## 2020-09-12 ENCOUNTER — Ambulatory Visit: Payer: 59

## 2020-09-12 ENCOUNTER — Ambulatory Visit: Payer: 59 | Admitting: Oncology

## 2020-09-29 MED FILL — REPATHA SURECLICK 140 MG/ML: 140 | 28 days supply | Qty: 2 | Fill #6

## 2020-10-01 DIAGNOSIS — Z9013 Acquired absence of bilateral breasts and nipples: Secondary | ICD-10-CM | POA: Diagnosis not present

## 2020-10-22 MED FILL — VENLAFAXINE HCL ER 37.5 MG: 37.5 | 90 days supply | Qty: 90 | Fill #1

## 2020-10-24 MED FILL — EXEMESTANE 25 MG TABLET: 25 | 90 days supply | Qty: 90 | Fill #1

## 2020-10-24 MED FILL — REPATHA SURECLICK 140 MG/ML: 140 | 28 days supply | Qty: 2 | Fill #7

## 2020-10-31 ENCOUNTER — Telehealth: Payer: Self-pay | Admitting: Cardiology

## 2020-10-31 DIAGNOSIS — E7849 Other hyperlipidemia: Secondary | ICD-10-CM

## 2020-10-31 DIAGNOSIS — Z79899 Other long term (current) drug therapy: Secondary | ICD-10-CM

## 2020-10-31 NOTE — Telephone Encounter (Signed)
    Pt would like to get lab work done, she said she wants her cholesterol check since she's been taking repatha. No order on file yet

## 2020-11-03 DIAGNOSIS — Z1159 Encounter for screening for other viral diseases: Secondary | ICD-10-CM | POA: Diagnosis not present

## 2020-11-03 NOTE — Telephone Encounter (Signed)
Happy to order these at [facility] for her

## 2020-11-03 NOTE — Telephone Encounter (Signed)
Order for CMET and lipids placed for the pt to have done.  Will send this message to our lipid clinic for further follow-up and manage of this, for they closely follow this pt in their clinic for lipids and repatha management.

## 2020-11-03 NOTE — Telephone Encounter (Signed)
Called pt about setting up lab apt. She has a physical on 1/25 with Dr. Yong Channel. I will see if Dr. Yong Channel will order lipid panel and CMP to be drawn at her physical.

## 2020-11-13 ENCOUNTER — Telehealth: Payer: Self-pay | Admitting: Gastroenterology

## 2020-11-13 NOTE — Telephone Encounter (Signed)
Spoke with the patient. COVID+ 10 days ago. She had sinus symptoms. She is afebrile. She calls because she has developed diarrhea in the past few days. 7 to 8 liquid stools yesterday. She is hungry but does not want to eat or drink because "it goes right through me." PO intake is mostly crackers, ginger-ale and water. She is on treatment for breast cancer.  She also asks if this is symptom is part of COVID.

## 2020-11-13 NOTE — Telephone Encounter (Signed)
COVID can cause GI symptoms including severe diarrhea.  Please advise patient to maintain adequate hydration, avoid soda or sugary fruit juice as it can exacerbate diarrhea.  Drink sips of Pedialyte and water.  Continue with bland diet.  If continues to have persistent diarrhea, will plan to check GI pathogen panel to exclude any other infectious etiology.  Thank you

## 2020-11-13 NOTE — Telephone Encounter (Signed)
Patient called states she is having issues with BM and is seeking advise. °

## 2020-11-13 NOTE — Telephone Encounter (Signed)
Called patient and gave Dr. Woodward Ku recommendations. Asked her to let us know if symptoms don't get better in a couple of days, and Dr. Silverio Decamp would do a stool test for infectious causes

## 2020-11-15 ENCOUNTER — Ambulatory Visit (HOSPITAL_COMMUNITY)
Admission: EM | Admit: 2020-11-15 | Discharge: 2020-11-15 | Disposition: A | Discharge status: Home / Self Care | Payer: 59 | Attending: Physician Assistant | Admitting: Physician Assistant

## 2020-11-15 ENCOUNTER — Telehealth: Payer: Self-pay | Admitting: Physician Assistant

## 2020-11-15 ENCOUNTER — Other Ambulatory Visit: Payer: Self-pay

## 2020-11-15 ENCOUNTER — Encounter (HOSPITAL_COMMUNITY): Payer: Self-pay | Admitting: Emergency Medicine

## 2020-11-15 DIAGNOSIS — U071 COVID-19: Secondary | ICD-10-CM

## 2020-11-15 DIAGNOSIS — R197 Diarrhea, unspecified: Secondary | ICD-10-CM | POA: Insufficient documentation

## 2020-11-15 DIAGNOSIS — A0839 Other viral enteritis: Secondary | ICD-10-CM

## 2020-11-15 LAB — CBC WITH DIFFERENTIAL/PLATELET
Abs Immature Granulocytes: 0.03 10*3/uL (ref 0.00–0.07)
Basophils Absolute: 0 10*3/uL (ref 0.0–0.1)
Basophils Relative: 1 %
Eosinophils Absolute: 0.2 10*3/uL (ref 0.0–0.5)
Eosinophils Relative: 2 %
HCT: 45.3 % (ref 36.0–46.0)
Hemoglobin: 15.2 g/dL — ABNORMAL HIGH (ref 12.0–15.0)
Immature Granulocytes: 0 %
Lymphocytes Relative: 21 %
Lymphs Abs: 1.4 10*3/uL (ref 0.7–4.0)
MCH: 31.5 pg (ref 26.0–34.0)
MCHC: 33.6 g/dL (ref 30.0–36.0)
MCV: 94 fL (ref 80.0–100.0)
Monocytes Absolute: 0.6 10*3/uL (ref 0.1–1.0)
Monocytes Relative: 9 %
Neutro Abs: 4.7 10*3/uL (ref 1.7–7.7)
Neutrophils Relative %: 67 %
Platelets: 248 10*3/uL (ref 150–400)
RBC: 4.82 MIL/uL (ref 3.87–5.11)
RDW: 12.1 % (ref 11.5–15.5)
WBC: 6.9 10*3/uL (ref 4.0–10.5)
nRBC: 0 % (ref 0.0–0.2)

## 2020-11-15 LAB — BASIC METABOLIC PANEL
Anion gap: 11 (ref 5–15)
BUN: 11 mg/dL (ref 6–20)
CO2: 25 mmol/L (ref 22–32)
Calcium: 9.6 mg/dL (ref 8.9–10.3)
Chloride: 104 mmol/L (ref 98–111)
Creatinine, Ser: 0.6 mg/dL (ref 0.44–1.00)
GFR, Estimated: >60 mL/min (ref >60)
Glucose, Bld: 82 mg/dL (ref 70–99)
Potassium: 3.6 mmol/L (ref 3.5–5.1)
Sodium: 140 mmol/L (ref 135–145)

## 2020-11-15 MED ORDER — SODIUM CHLORIDE 0.9 % IV BOLUS
500.0000 mL | Freq: Once | INTRAVENOUS | Status: AC
  Administered 2020-11-15: 500 mL via INTRAVENOUS

## 2020-11-15 NOTE — Telephone Encounter (Cosign Needed)
Dr. Laurance Flatten called the answering service and I spoke with him.  This is a patient who has had colonoscopy with Dr. Silverio Decamp in the past.  She recently was positive for COVID-19 10 days ago.  Has a history of breast cancer Several family members also tested positive for COVID. Respiratory wise sounds like the patient is doing okay but she has developed loose stools with increased frequency had at least 10 or 15 episodes of watery, nonbloody diarrhea in the last 24 hours.  She reached out to her PCP who suggested she go to urgent care for IV fluids and lab work.  Apparently urgent care is not set up to do some of the stool testing so Dr. Laurance Flatten reach out to see if we could enter orders.  He thinks he will probably come in for any lab work ordered on Monday. At his request I ordered a CBC, BMP and stool PCR and C. difficile.  Will route these results to Dr. Silverio Decamp and Garret Reddish her PCP

## 2020-11-15 NOTE — Discharge Instructions (Signed)
Your labs are normal.  Follow up with your Physician as scheduled.  Drink plenty of fluids.

## 2020-11-15 NOTE — ED Triage Notes (Signed)
Pt states that she is having diarrhea x 10 daily. Pt states that between 4:30-8:30pm is mainly when the diarrhea occurs. Pt states that she is able to tolerate fluids and saltine crackers.

## 2020-11-15 NOTE — ED Provider Notes (Signed)
[facility]    CSN: [id number] Arrival date & time: 11/15/20  1143      History   Chief Complaint Chief Complaint  Patient presents with  . Diarrhea    HPI Katelyn Shelton is a 45 y.o. female.   The history is provided by the patient. No language interpreter was used.  Diarrhea Quality:  Watery Severity:  Severe Onset quality:  Gradual Number of episodes:  Multiple Timing:  Constant zpt reports she was diagnosed with covid in early January.  Pt reports mild symptoms however she began having diarrhea.  Pt reports multiple episodes of diarrhea a day. Pt taking imodium with out relief.  Pt reports she has an appointment with her MD of Tuesday.  No vomiting. Pt able to drink fluids. Pt is worried about salmonella due to eating raw egg in bake goods she cooked.   Past Medical History:  Diagnosis Date  . Allergy   . Anxiety    venflafaxine XR 75 mg  . Breast cancer ([place]) 01/2017   right; genetic testing negative in 2018 with [employer] panel  . Heart murmur    as child- went away  . History of MRSA infection    elbow  . Hyperlipidemia   . Joint pain    due to medicine induced joint pain  . Migraine    otc med prn  . Miscarriage    x1  . Neuromuscular disorder ([place])    neuropathy bilater feet - ? 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 Osteopenia   . UTI (urinary tract infection)     Patient Active Problem List   Diagnosis Date Noted  . Osteopenia of lumbar spine 05/28/2020  . Cellulitis of breast 12/06/2019  . History of total hysterectomy 06/29/2018  . Lesion of lung 04/05/2018  . Abnormal CT scan of lung 04/05/2018  . History of cancer 04/05/2018  . Family history of colonic polyps 02/24/2018  . 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 Enchondroma of bone 02/24/2018  . Anxiety   . Genetic testing 02/21/2017  . Family history of stomach cancer   . Malignant neoplasm of upper-outer quadrant of right breast in female, estrogen receptor positive ([place]) 02/11/2017   . Hidradenitis suppurativa 08/26/2011  . Hyperlipidemia, familial, high LDL 01/16/2010    Past Surgical History:  Procedure Laterality Date  . AREOLA/NIPPLE RECONSTRUCTION WITH GRAFT Right 12/01/2018   Procedure: RIGHT NIPPLE AREOLA COMPLEX RECONSTRUCTION WITH FULL THICKNESS SKIN GRAFT FROM RIGHT GROIN;  Surgeon: Irene Limbo, MD;  Location: [facility];  Service: Plastics;  Laterality: Right;  . BREAST RECONSTRUCTION Left 12/01/2018   Procedure: LEFT BREAST REVISION RECONSTRUCTION WITH SKIN EXCISION;  Surgeon: Irene Limbo, MD;  Location: [facility];  Service: Plastics;  Laterality: Left;  . BREAST RECONSTRUCTION Left 12/01/2018   Procedure: COMPOSITE GRAFT FROM LEFT NIPPLE (NIPPLE SHARING);  Surgeon: Irene Limbo, MD;  Location: [facility];  Service: Plastics;  Laterality: Left;  . BREAST RECONSTRUCTION WITH PLACEMENT OF TISSUE EXPANDER AND FLEX HD (ACELLULAR HYDRATED DERMIS) Bilateral 02/28/2017   Procedure: BILATERAL BREAST RECONSTRUCTION WITH PLACEMENT OF TISSUE EXPANDER AND ALLODERM;  Surgeon: Irene Limbo, MD;  Location: [facility];  Service: Plastics;  Laterality: Bilateral;  . CESAREAN SECTION  2008,2010,2013   x 3  . CESAREAN SECTION  02/04/2012   Procedure: CESAREAN SECTION;  Surgeon: Cyril Mourning, MD;  Location: [facility];  Service: Gynecology;  Laterality: N/A;  Repeat Cesarean Section Delivery  Boy  @  [phone],  [facility] 9/10  . COLONOSCOPY     polyp  . DILATION AND CURETTAGE OF UTERUS    . EXCISION OF BREAST LESION Right 03/15/2017   Procedure: RIGHT NIPPLE AND AREOLA EXCISION;  Surgeon: Rolm Bookbinder, MD;  Location: [facility];  Service: General;  Laterality: Right;  . LAPAROSCOPIC VAGINAL HYSTERECTOMY WITH SALPINGO OOPHORECTOMY Bilateral 06/29/2018   Procedure: LAPAROSCOPIC ASSISTED VAGINAL HYSTERECTOMY WITH SALPINGO OOPHORECTOMY;  Surgeon: Louretta Shorten, MD;  Location: [facility];  Service:  Gynecology;  Laterality: Bilateral;  Request case for [facility] CRNA  . LIPOSUCTION WITH LIPOFILLING N/A 07/25/2018   Procedure: LIPOSUCTION WITH LIPOFILLING FROM ABDOMEN TO CHEST;  Surgeon: Irene Limbo, MD;  Location: [facility];  Service: Plastics;  Laterality: N/A;  . LIPOSUCTION WITH LIPOFILLING Bilateral 12/01/2018   Procedure: LIPOFILLING TO BILATERAL CHEST REVISION;  Surgeon: Irene Limbo, MD;  Location: [facility];  Service: Plastics;  Laterality: Bilateral;  . NIPPLE SPARING MASTECTOMY Bilateral 02/28/2017   Procedure: RIGHT NIPPLE SPARING MASTECTOMY; LEFT PROPHYLACTIC NIPPLE SPARING MASTECTOMY;  Surgeon: Rolm Bookbinder, MD;  Location: [facility];  Service: General;  Laterality: Bilateral;  . PECTUS EXCAVATUM REPAIR  158   45 years old- very large implant  . REMOVAL OF TISSUE EXPANDER AND PLACEMENT OF IMPLANT Bilateral 07/25/2018   Procedure: REMOVAL OF TISSUE EXPANDER AND PLACEMENT OF SILICONE IMPLANT;  Surgeon: Irene Limbo, MD;  Location: [facility];  Service: Plastics;  Laterality: Bilateral;  . SENTINEL NODE BIOPSY Right 02/28/2017   Procedure: RIGHT AXILLARY SENTINEL LYMPH  NODE BIOPSY;  Surgeon: Rolm Bookbinder, MD;  Location: [facility];  Service: General;  Laterality: Right;  . VULVAR LESION REMOVAL    . WISDOM TOOTH EXTRACTION      OB History    Gravida  4   Para  3   Term  1   Preterm      AB  1   Living  3     SAB      IAB      Ectopic      Multiple      Live Births  1            Home Medications    Prior to Admission medications   Medication Sig Start Date End Date Taking? Authorizing Provider  Calcium Carbonate (CALCIUM 600 PO) Take 1 capsule by mouth daily.     [provider]  denosumab (PROLIA) 60 MG/ML SOSY injection Inject 60 mg into the skin every 6 (six) months.    [provider]  doxycycline (VIBRA-TABS) 100 MG tablet Take 100  mg by mouth 2 (two) times daily. 05/31/20   [provider]  exemestane (AROMASIN) 25 MG tablet TAKE 1 TABLET (25 MG TOTAL) BY MOUTH DAILY AFTER BREAKFAST. 08/07/20   Magrinat, Virgie Dad, MD  Multiple Vitamins-Minerals (MULTIVITAMIN WOMEN 50+) TABS Take 1 tablet by mouth daily.  11/25/17   [provider]  REPATHA SURECLICK [id number] MG/ML SOAJ INJECT 1 PEN INTO THE SKIN EVERY 14 DAYS. 04/14/20   Dorothy Spark, MD  venlafaxine XR (EFFEXOR-XR) 37.5 MG 24 hr capsule TAKE 1 CAPSULE BY MOUTH DAILY WITH BREAKFAST 07/22/20   Causey, Charlestine Massed, NP    Family History Family History  Problem Relation Age of Onset  . Skin cancer Mother   . Hyperlipidemia Mother   . Hypertension Mother   . Colon polyps Mother   . Hyperlipidemia Father   . Parkinson's disease Sister 66  early on set  . Miscarriages / Stillbirths Sister   . Diabetes Maternal Aunt   . Colon polyps Maternal Uncle   . Heart failure Maternal Grandmother   . Stroke Maternal Grandmother   . Heart attack Maternal Grandfather   . Depression Maternal Grandfather   . Early death Maternal Grandfather        55 MI  . Stomach cancer Paternal Grandmother   . Prostate cancer Paternal Grandfather   . Alzheimer's disease Paternal Grandfather   . Breast cancer Cousin        mother's paternal first FEMALE cousin  . Colon polyps Sister   . Colon cancer Neg Hx   . Rectal cancer Neg Hx     Social History Social History   Tobacco Use  . Smoking status: Never Smoker  . Smokeless tobacco: Never Used  Vaping Use  . Vaping Use: Never used  Substance Use Topics  . Alcohol use: Yes    Alcohol/week: 1.0 - 2.0 standard drink    Types: 1 - 2 Standard drinks or equivalent per week    Comment: occasional wine - twice monthly  . Drug use: No     Allergies   Fentanyl, Peach flavor, Penicillins, Sulfa antibiotics, Sulfasalazine, and Latex   Review of Systems Review of Systems  Gastrointestinal: Positive for diarrhea.   All other systems reviewed and are negative.    Physical Exam Triage Vital Signs ED Triage Vitals  Enc Vitals Group     BP 11/15/20 1212 (!) 128/93     Pulse Rate 11/15/20 1212 81     Resp --      Temp 11/15/20 1212 98.1 F (36.7 C)     Temp Source 11/15/20 1212 Oral     SpO2 11/15/20 1212 96 %     Weight --      Height --      Head Circumference --      Peak Flow --      Pain Score 11/15/20 1209 0     Pain Loc --      Pain Edu? --      Excl. in [place]? --    No data found.  Updated Vital Signs BP (!) 128/93 (BP Location: Left Arm)   Pulse 81   Temp 98.1 F (36.7 C) (Oral)   LMP 09/01/2017   SpO2 96%   Visual Acuity Right Eye Distance:   Left Eye Distance:   Bilateral Distance:    Right Eye Near:   Left Eye Near:    Bilateral Near:     Physical Exam Vitals and nursing note reviewed.  Constitutional:      Appearance: She is well-developed and well-nourished.  HENT:     Head: Normocephalic.  Eyes:     Extraocular Movements: EOM normal.  Cardiovascular:     Rate and Rhythm: Normal rate.  Pulmonary:     Effort: Pulmonary effort is normal.  Abdominal:     General: There is no distension.  Musculoskeletal:        General: Normal range of motion.     Cervical back: Normal range of motion.  Skin:    General: Skin is warm.  Neurological:     Mental Status: She is alert and oriented to person, place, and time.  Psychiatric:        Mood and Affect: Mood and affect and mood normal.      UC Treatments / Results  Labs (all labs ordered are listed, but only  abnormal results are displayed) Labs Reviewed  CBC WITH DIFFERENTIAL/PLATELET - Abnormal; Notable for the following components:      Result Value   Hemoglobin 15.2 (*)    All other components within normal limits  BASIC METABOLIC PANEL    EKG   Radiology No results found.  Procedures Procedures (including critical care time)  Medications Ordered in UC Medications  sodium chloride 0.9 %  bolus 500 mL (0 mLs Intravenous Stopped 11/15/20 1441)  sodium chloride 0.9 % bolus 500 mL (0 mLs Intravenous Stopped 11/15/20 1441)    Initial Impression / Assessment and Plan / UC Course  I have reviewed the triage vital signs and the nursing notes.  Pertinent labs & imaging results that were available during my care of the patient were reviewed by me and considered in my medical decision making (see chart for details).     MDM:  Vitals normal. Pt reports her father is an Md and wants her to have cbc, bmet and IV fluids.   Pt given IV fluids x 1 liter, BMet and cbc ordered.   Labs returned and are normal.    Pt advised to follow up with her MD. Final Clinical Impressions(s) / UC Diagnoses   Final diagnoses:  Diarrhea, unspecified type     Discharge Instructions     Your labs are normal.  Follow up with your Physician as scheduled.  Drink plenty of fluids.     ED Prescriptions    None     PDMP not reviewed this encounter.  An After Visit Summary was printed and given to the patient.    Fransico Meadow, [place] 11/15/20 1535

## 2020-11-16 NOTE — Telephone Encounter (Signed)
Thank so much for taking care of her

## 2020-11-17 MED FILL — REPATHA SURECLICK 140 MG/ML: 140 | 28 days supply | Qty: 2 | Fill #8

## 2020-11-17 NOTE — Patient Instructions (Addendum)
Please stop by lab before you go If you have mychart- we will send your results within 3 business days of [place] receiving them.  If you do not have mychart- we will call you about results within 5 business days of [place] receiving them.  *please also note that you will see labs on mychart as soon as they post. I will later go in and write notes on them- will say "notes from Dr. Yong Channel"  Recommended follow up: Return in about 1 year (around 11/18/2021) for physical or sooner if needed.

## 2020-11-17 NOTE — Telephone Encounter (Signed)
Thank you Sarah

## 2020-11-17 NOTE — Progress Notes (Signed)
Phone [phone]   Subjective:  Patient presents today for their annual physical. Chief complaint-noted.   See problem oriented charting- ROS- full  review of systems was completed and negative except for: activity change, fatigue but just had covid, congestion with covid, mouth sores, diarrhea with covid, painful sex, seasonal allergies  The following were reviewed and entered/updated in epic: Past Medical History:  Diagnosis Date  . Allergy   . Anxiety    venflafaxine XR 75 mg  . Breast cancer ([place]) 01/2017   right; genetic testing negative in 2018 with [facility] panel  . Heart murmur    as child- went away  . History of MRSA infection    elbow  . Hyperlipidemia   . Joint pain    due to medicine induced joint pain  . Migraine    otc med prn  . Miscarriage    x1  . Neuromuscular disorder ([place])    neuropathy bilater feet - ? 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 Osteopenia   . UTI (urinary tract infection)    Patient Active Problem List   Diagnosis Date Noted  . Malignant neoplasm of upper-outer quadrant of right breast in female, estrogen receptor positive ([place]) 02/11/2017    Priority: High  . Hyperlipidemia, familial, high LDL 01/16/2010    Priority: High  . Osteopenia of lumbar spine 05/28/2020    Priority: Medium  . Aromatase inhibitor-associated arthralgia 02/24/2018    Priority: Medium  . Enchondroma of bone 02/24/2018    Priority: Medium  . Anxiety     Priority: Medium  . Hidradenitis suppurativa 08/26/2011    Priority: Medium  . Family history of colonic polyps 02/24/2018    Priority: Low  . Genetic testing 02/21/2017    Priority: Low  . Family history of stomach cancer     Priority: Low  . Cellulitis of breast 12/06/2019  . History of total hysterectomy 06/29/2018  . Lesion of lung 04/05/2018  . Abnormal CT scan of lung 04/05/2018  . History of cancer 04/05/2018   Past Surgical History:  Procedure Laterality Date  . AREOLA/NIPPLE RECONSTRUCTION WITH GRAFT Right  12/01/2018   Procedure: RIGHT NIPPLE AREOLA COMPLEX RECONSTRUCTION WITH FULL THICKNESS SKIN GRAFT FROM RIGHT GROIN;  Surgeon: Irene Limbo, MD;  Location: [facility];  Service: Plastics;  Laterality: Right;  . BREAST RECONSTRUCTION Left 12/01/2018   Procedure: LEFT BREAST REVISION RECONSTRUCTION WITH SKIN EXCISION;  Surgeon: Irene Limbo, MD;  Location: [facility];  Service: Plastics;  Laterality: Left;  . BREAST RECONSTRUCTION Left 12/01/2018   Procedure: COMPOSITE GRAFT FROM LEFT NIPPLE (NIPPLE SHARING);  Surgeon: Irene Limbo, MD;  Location: [facility];  Service: Plastics;  Laterality: Left;  . BREAST RECONSTRUCTION WITH PLACEMENT OF TISSUE EXPANDER AND FLEX HD (ACELLULAR HYDRATED DERMIS) Bilateral 02/28/2017   Procedure: BILATERAL BREAST RECONSTRUCTION WITH PLACEMENT OF TISSUE EXPANDER AND ALLODERM;  Surgeon: Irene Limbo, MD;  Location: [facility];  Service: Plastics;  Laterality: Bilateral;  . CESAREAN SECTION  2008,2010,2013   x 3  . CESAREAN SECTION  02/04/2012   Procedure: CESAREAN SECTION;  Surgeon: Cyril Mourning, MD;  Location: [facility];  Service: Gynecology;  Laterality: N/A;  Repeat Cesarean Section Delivery  Boy  @  [phone], Apgars 9/10  . COLONOSCOPY     polyp  . DILATION AND CURETTAGE OF UTERUS    . EXCISION OF BREAST LESION Right 03/15/2017   Procedure: RIGHT NIPPLE AND AREOLA EXCISION;  Surgeon: Rolm Bookbinder, MD;  Location: [facility];  Service: General;  Laterality: Right;  . LAPAROSCOPIC VAGINAL HYSTERECTOMY WITH SALPINGO OOPHORECTOMY Bilateral 06/29/2018   Procedure: LAPAROSCOPIC ASSISTED VAGINAL HYSTERECTOMY WITH SALPINGO OOPHORECTOMY;  Surgeon: Louretta Shorten, MD;  Location: [facility];  Service: Gynecology;  Laterality: Bilateral;  Request case for [facility] CRNA  . LIPOSUCTION WITH LIPOFILLING N/A 07/25/2018   Procedure: LIPOSUCTION WITH LIPOFILLING FROM ABDOMEN TO CHEST;  Surgeon: Irene Limbo, MD;  Location: [facility];  Service: Plastics;  Laterality: N/A;  . LIPOSUCTION WITH LIPOFILLING Bilateral 12/01/2018   Procedure: LIPOFILLING TO BILATERAL CHEST REVISION;  Surgeon: Irene Limbo, MD;  Location: [facility];  Service: Plastics;  Laterality: Bilateral;  . NIPPLE SPARING MASTECTOMY Bilateral 02/28/2017   Procedure: RIGHT NIPPLE SPARING MASTECTOMY; LEFT PROPHYLACTIC NIPPLE SPARING MASTECTOMY;  Surgeon: Rolm Bookbinder, MD;  Location: [facility];  Service: General;  Laterality: Bilateral;  . PECTUS EXCAVATUM REPAIR  213   45 years old- very large implant  . REMOVAL OF TISSUE EXPANDER AND PLACEMENT OF IMPLANT Bilateral 07/25/2018   Procedure: REMOVAL OF TISSUE EXPANDER AND PLACEMENT OF SILICONE IMPLANT;  Surgeon: Irene Limbo, MD;  Location: [facility];  Service: Plastics;  Laterality: Bilateral;  . SENTINEL NODE BIOPSY Right 02/28/2017   Procedure: RIGHT AXILLARY SENTINEL LYMPH  NODE BIOPSY;  Surgeon: Rolm Bookbinder, MD;  Location: [facility];  Service: General;  Laterality: Right;  . VULVAR LESION REMOVAL    . WISDOM TOOTH EXTRACTION      Family History  Problem Relation Age of Onset  . Skin cancer Mother   . Hyperlipidemia Mother   . Hypertension Mother   . Colon polyps Mother   . Hyperlipidemia Father   . Parkinson's disease Sister 46       early on set  . Miscarriages / Stillbirths Sister   . Diabetes Maternal Aunt   . Colon polyps Maternal Uncle   . Heart failure Maternal Grandmother   . Stroke Maternal Grandmother   . Heart attack Maternal Grandfather   . Depression Maternal Grandfather   . Early death Maternal Grandfather        83 MI  . Stomach cancer Paternal Grandmother   . Prostate cancer Paternal Grandfather   . Alzheimer's disease Paternal Grandfather   . Breast cancer Cousin        mother's paternal first FEMALE cousin  . Colon polyps Sister   . Colon cancer Neg  Hx   . Rectal cancer Neg Hx     Medications- reviewed and updated Current Outpatient Medications  Medication Sig Dispense Refill  . Calcium Carbonate (CALCIUM 600 PO) Take 1 capsule by mouth daily.     [name] denosumab (PROLIA) 60 MG/ML SOSY injection Inject 60 mg into the skin every 6 (six) months.    [name] exemestane (AROMASIN) 25 MG tablet TAKE 1 TABLET (25 MG TOTAL) BY MOUTH DAILY AFTER BREAKFAST. 90 tablet 3  . Multiple Vitamins-Minerals (MULTIVITAMIN WOMEN 50+) TABS Take 1 tablet by mouth daily.     [name] REPATHA SURECLICK [id number] MG/ML SOAJ INJECT 1 PEN INTO THE SKIN EVERY 14 DAYS. 2 mL 11  . venlafaxine XR (EFFEXOR-XR) 37.5 MG 24 hr capsule TAKE 1 CAPSULE BY MOUTH DAILY WITH BREAKFAST 90 capsule 4   No current facility-administered medications for this visit.   Allergies-reviewed and updated Allergies  Allergen Reactions  . Fentanyl Itching    Severe itching - Patient refuses this medication  . Peach Flavor Anaphylaxis  . Penicillins Hives  AS A CHILD  . Sulfa Antibiotics Nausea And Vomiting  . Sulfasalazine Nausea And Vomiting and Other (See Comments)  . Latex Itching    Social History   Social History Narrative   Married to Dr. Aundra Dubin. 3 children.    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    Masters in public history at [employer]. Undergrad at [employer]: running, reading, exercise   Objective  Objective:  BP 108/72   Pulse 94   Temp 98.1 F (36.7 C) (Temporal)   Ht 5' (1.524 m)   Wt 142 lb 12.8 oz (64.8 kg)   LMP 09/01/2017   SpO2 97%   BMI 27.89 kg/m  Gen: NAD, resting comfortably HEENT: Mucous membranes are moist. Oropharynx normal other than slightly dry mucus membranes Neck: no thyromegaly CV: RRR no murmurs rubs or gallops Lungs: CTAB no crackles, wheeze, rhonchi Abdomen: soft/mild diffuse tenderness after recent omicron illness/nondistended/normal bowel sounds. No rebound or guarding.  Ext: no edema Skin: warm,  dry Neuro: grossly normal, moves all extremities, PERRLA   Assessment and Plan   45 y.o. female presenting for annual physical.  Health Maintenance counseling: 1. Anticipatory guidance: Patient counseled regarding regular dental exams -q6 months, eye exams - yearly,  avoiding smoking and second hand smoke , limiting alcohol to 1 beverage per day- much less than that .   2. Risk factor reduction:  Advised patient of need for regular exercise and diet rich and fruits and vegetables to reduce risk of heart attack and stroke. Exercise- exercise has been down even prior due to covid- has walked dog 3-4 miles some days- colder weather is harder- encouraged to restart. Diet-feels she could cut down on carbohydrates.  Wt Readings from Last 3 Encounters:  11/18/20 142 lb 12.8 oz (64.8 kg)  07/15/20 143 lb (64.9 kg)  07/10/20 143 lb 6.4 oz (65 kg)  3. Immunizations/screenings/ancillary studies-fully up-to-date.  Tdap reported October 25, 2012.  Discussed one-time lifetime hepatitis C screening-opts in Immunization History  Administered Date(s) Administered  . Influenza,inj,Quad PF,6+ Mos 09/17/2015, 09/16/2016, 06/21/2019  . Influenza-Unspecified 07/03/2020  . Moderna Sars-Covid-2 Vaccination 11/09/2019, 12/15/2019, 07/03/2020  4. Cervical cancer screening- history of total hysterectomy including cervix per patient for benign reasons-has been told Pap smears not required.  She still follows with Dr. Corinna Capra and did have Pap smear April 2018- has had follow up visits.  5. Breast cancer history-originally detected April 2018.  Later had bilateral nipple sparing mastectomy Dr. Donne Hazel 2018-with close margins later had reexcision of right nipple areolar in May 2018.  Patient with adjuvant chemotherapy as well as radiation which was completed December 2018.  Side effects on tamoxifen and later anastrozole as well as exemestane (still having issues with side effects but back on this).  Plan at September visit was  to switch to fulvestrant (she tried this but no better) 6. Colon cancer screening - 03/09/18 with 5 year repeat due to family history 7. Skin cancer screening- Dr. Fontaine No. advised regular sunscreen use. Denies worrisome, changing, or new skin lesions.  8. Birth control/STD check- hysterectomy and monogamous 9. Osteoporosis screening at 54-  osteoepnia- already on Prolia and followed by oncology.  Also on calcium and vitamin D -Never smoker  Status of chronic or acute concerns   #Considering breast [facility] with [facility].  Previously had tissue expanders exchanged for permanent silicone implants with [name] October 2019.  Later had cellulitis develop February 2021 requiring antibiotics.  with pectus excavatum and history of sternal implant in 1996. hAs previously received multiple opinions about definitive breast reconstruction including [facility], [facility], [place] as well as [facility] -appears plan at September visit was CT (CTA of chest abdomen pelvis) scan and follow-up after that point. Reports had this in October.  - Patient with possible subclinical infection of right breast implant and was advised to wait at least 3 months before having reconstruction -has follow up next Monday- off doxycycline for last month without flare up- coordinated this with Dr. Donne Hazel -she is most interested in deep flap surgery but not clear that's a good option. She is hesitant about back flap.   #Left wrist pain-last visit patient appeared to have a small ganglion cyst over the left radial sided wrist as well as de Quervain's tenosynovitis-recommended thumb spica splint and gave home exercises and recommended Aleve twice daily for 7 days.  Patient reports pain better but cyst is larger- she wants to hold off on sports medicine referral for now. She will give [place] a call and we can refer to sports med or orthopedics if needed   #hyperlipidemia familial -coronary calcium score of 0 on March 30, 2018 S: Medication:Repatha under 40 mg every 2 weeks Lab Results  Component Value Date   CHOL 131 01/04/2019   HDL 53 01/04/2019   LDLCALC 38 01/04/2019   TRIG 198 (H) 01/04/2019   CHOLHDL 2.5 01/04/2019   A/P: update lipid panel today- likely continue current meds- sees cardiology in may- Dr. Meda Coffee  # Anxiety S:Medication: Venlafaxine extended release 37.5 mg Counseling: [name] as needed- last visit in a year A/P: reasonable control with GAD7 of 4. Continue current medicines . Could follow up with therapist  #Diarrhea S: Patient was seen at urgent care on November 15, 2020-had CBC, BMP, IV fluids given.  Patient had tested positive for COVID-19 November 02, 2020 with symptoms as early as January 5th-no significant respiratory issues.  She did at time of urgent care visit have 10 or 15 episodes of watery, nonbloody diarrhea.  Patient reached out to GI and plan was for GI profile with PCR, C. difficile, BMP and CBC-does not appear these were obtained- diarrhea finally stopped Sunday so she did not do stool testing.  A/P: sounds like diarrhea from covid 19 likely omicron variant- did receive fluids- still doesn't feel fully back to her normal self- fasting this Am has thrown her off some too- felt better yesterday- more energy. Having some more fatigue. Slight brain fog. appetite was very low with covid   Recommended follow up: Return in about 1 year (around 11/18/2021) for physical or sooner if needed. Future Appointments  Date Time Provider [place]  01/15/2021 10:00 AM [facility] None  01/15/2021 10:30 AM Magrinat, Virgie Dad, MD [facility] None  03/05/2021  9:20 AM Dorothy Spark, MD [id number]   Lab/Order associations: fasting   ICD-10-CM   1. Preventative health care  Z00.00 Comprehensive metabolic panel    Lipid panel    Hepatitis C antibody  2. Anxiety  F41.9   3. Hyperlipidemia, familial, high LDL  E78.49 Comprehensive metabolic  panel    Lipid panel  4. Encounter for hepatitis C screening test for low risk patient  Z11.59 Hepatitis C antibody    No orders of the defined types were placed in this encounter.   Return precautions advised.  Garret Reddish, MD

## 2020-11-18 ENCOUNTER — Ambulatory Visit (INDEPENDENT_AMBULATORY_CARE_PROVIDER_SITE_OTHER): Payer: 59 | Admitting: Family Medicine

## 2020-11-18 ENCOUNTER — Other Ambulatory Visit: Payer: Self-pay

## 2020-11-18 ENCOUNTER — Encounter: Payer: Self-pay | Admitting: Family Medicine

## 2020-11-18 VITALS — BP 108/72 | HR 94 | Temp 98.1°F | Ht 60.0 in | Wt 142.8 lb

## 2020-11-18 DIAGNOSIS — C50411 Malignant neoplasm of upper-outer quadrant of right female breast: Secondary | ICD-10-CM | POA: Diagnosis not present

## 2020-11-18 DIAGNOSIS — F419 Anxiety disorder, unspecified: Secondary | ICD-10-CM | POA: Diagnosis not present

## 2020-11-18 DIAGNOSIS — Z0000 Encounter for general adult medical examination without abnormal findings: Secondary | ICD-10-CM

## 2020-11-18 DIAGNOSIS — Z170 Estrogen receptor positive status [ER+]: Secondary | ICD-10-CM

## 2020-11-18 DIAGNOSIS — Z1159 Encounter for screening for other viral diseases: Secondary | ICD-10-CM

## 2020-11-18 DIAGNOSIS — E7849 Other hyperlipidemia: Secondary | ICD-10-CM

## 2020-11-18 LAB — LIPID PANEL
Cholesterol: 111 mg/dL (ref 0–200)
HDL: 51.1 mg/dL (ref >39.00)
LDL Cholesterol: 30 mg/dL (ref 0–99)
NonHDL: 59.72
Total CHOL/HDL Ratio: 2
Triglycerides: 150 mg/dL — ABNORMAL HIGH (ref 0.0–149.0)
VLDL: 30 mg/dL (ref 0.0–40.0)

## 2020-11-18 LAB — COMPREHENSIVE METABOLIC PANEL
ALT: 70 U/L — ABNORMAL HIGH (ref 0–35)
AST: 43 U/L — ABNORMAL HIGH (ref 0–37)
Albumin: 4.9 g/dL (ref 3.5–5.2)
Alkaline Phosphatase: 44 U/L (ref 39–117)
BUN: 10 mg/dL (ref 6–23)
CO2: 28 mEq/L (ref 19–32)
Calcium: 9.7 mg/dL (ref 8.4–10.5)
Chloride: 104 mEq/L (ref 96–112)
Creatinine, Ser: 0.59 mg/dL (ref 0.40–1.20)
GFR: 110.18 mL/min (ref >60.00)
Glucose, Bld: 84 mg/dL (ref 70–99)
Potassium: 3.8 mEq/L (ref 3.5–5.1)
Sodium: 140 mEq/L (ref 135–145)
Total Bilirubin: 0.5 mg/dL (ref 0.2–1.2)
Total Protein: 8.1 g/dL (ref 6.0–8.3)

## 2020-11-19 ENCOUNTER — Other Ambulatory Visit: Payer: Self-pay

## 2020-11-19 DIAGNOSIS — R7401 Elevation of levels of liver transaminase levels: Secondary | ICD-10-CM

## 2020-11-19 LAB — HEPATITIS C ANTIBODY
Hepatitis C Ab: NONREACTIVE
SIGNAL TO CUT-OFF: 0.01 (ref <1.00)

## 2020-11-20 ENCOUNTER — Telehealth: Payer: Self-pay

## 2020-11-20 ENCOUNTER — Other Ambulatory Visit: Payer: Self-pay | Admitting: Oncology

## 2020-11-20 DIAGNOSIS — Z170 Estrogen receptor positive status [ER+]: Secondary | ICD-10-CM

## 2020-11-20 DIAGNOSIS — C50411 Malignant neoplasm of upper-outer quadrant of right female breast: Secondary | ICD-10-CM

## 2020-11-20 NOTE — Telephone Encounter (Signed)
Pt called regarding lab results. Please advise 

## 2020-11-20 NOTE — Telephone Encounter (Signed)
Returned pt call and labs reviewed.  

## 2020-11-20 NOTE — Progress Notes (Unsigned)
Katelyn Shelton had Covid diagnosed 11/02/2020.  She was seen at urgent care.  She then had a well visit 2 days ago where lab tests were obtained including a c-Met and this showed a slight bump in her transaminases, specifically her AST was 43 and her ALT 70.  The total protein was nine 8.1 the albumin 4.9 and the alkaline phosphatase 44, with a bilirubin of 0.5 everything else was normal my HIV and hep C were negative, and a CBC with differential on 11/15/2020 was unremarkable  I think all we really need to do at this point is repeat these labs.  If there is a persistent or progressive elevation we can consider ultrasound of the liver to document possible fatty liver.  Katelyn Shelton is agreeable to this 

## 2020-11-21 ENCOUNTER — Telehealth: Payer: Self-pay | Admitting: Oncology

## 2020-11-21 NOTE — Telephone Encounter (Signed)
Scheduled appt per 12/7 sch msg - pt is aware of appt date and time   

## 2020-11-24 DIAGNOSIS — Z170 Estrogen receptor positive status [ER+]: Secondary | ICD-10-CM | POA: Diagnosis not present

## 2020-11-24 DIAGNOSIS — Z9013 Acquired absence of bilateral breasts and nipples: Secondary | ICD-10-CM | POA: Diagnosis not present

## 2020-11-24 DIAGNOSIS — C50411 Malignant neoplasm of upper-outer quadrant of right female breast: Secondary | ICD-10-CM | POA: Diagnosis not present

## 2020-12-02 ENCOUNTER — Inpatient Hospital Stay: Payer: 59 | Attending: Oncology

## 2020-12-02 ENCOUNTER — Other Ambulatory Visit: Payer: Self-pay

## 2020-12-02 DIAGNOSIS — Z9013 Acquired absence of bilateral breasts and nipples: Secondary | ICD-10-CM | POA: Insufficient documentation

## 2020-12-02 DIAGNOSIS — Z79811 Long term (current) use of aromatase inhibitors: Secondary | ICD-10-CM | POA: Diagnosis not present

## 2020-12-02 DIAGNOSIS — Z170 Estrogen receptor positive status [ER+]: Secondary | ICD-10-CM | POA: Diagnosis not present

## 2020-12-02 DIAGNOSIS — C50411 Malignant neoplasm of upper-outer quadrant of right female breast: Secondary | ICD-10-CM | POA: Diagnosis not present

## 2020-12-02 LAB — HEPATIC FUNCTION PANEL
ALT: 45 U/L — ABNORMAL HIGH (ref 0–44)
AST: 30 U/L (ref 15–41)
Albumin: 4.6 g/dL (ref 3.5–5.0)
Alkaline Phosphatase: 40 U/L (ref 38–126)
Bilirubin, Direct: <0.1 mg/dL (ref 0.0–0.2)
Total Bilirubin: 0.8 mg/dL (ref 0.3–1.2)
Total Protein: 7.8 g/dL (ref 6.5–8.1)

## 2020-12-02 LAB — HEPATITIS B SURFACE ANTIGEN: Hepatitis B Surface Ag: NONREACTIVE

## 2020-12-02 LAB — CEA (IN HOUSE-CHCC): CEA (CHCC-In House): 2.51 ng/mL (ref 0.00–5.00)

## 2020-12-02 LAB — HEPATITIS B CORE ANTIBODY, IGM: Hep B C IgM: NONREACTIVE

## 2020-12-03 ENCOUNTER — Encounter: Payer: Self-pay | Admitting: Oncology

## 2020-12-03 LAB — CANCER ANTIGEN 27.29: CA 27.29: 27.6 U/mL (ref 0.0–38.6)

## 2020-12-22 MED FILL — REPATHA SURECLICK 140 MG/ML: 140 | 28 days supply | Qty: 2 | Fill #9

## 2021-01-12 MED FILL — EXEMESTANE 25 MG TABLET: 25 | 90 days supply | Qty: 90 | Fill #2

## 2021-01-13 ENCOUNTER — Other Ambulatory Visit: Payer: Self-pay

## 2021-01-13 DIAGNOSIS — C50411 Malignant neoplasm of upper-outer quadrant of right female breast: Secondary | ICD-10-CM

## 2021-01-14 NOTE — Progress Notes (Signed)
[place]  [phone] [phone]     ID: Katelyn Shelton Shelton DOB: [birth year]  MR#: [id number]  TXM#:[id number]  [place] Care Team: Katelyn Olp, MD as PCP - General (Family Medicine) Katelyn Shelton Shelton, Katelyn Dad, MD as Consulting Physician (Oncology) Katelyn Bookbinder, MD as Consulting Physician (General Surgery) Katelyn Shelton Shorten, MD as Consulting Physician (Obstetrics and Gynecology) Katelyn Shelton Shelton Katelyn Nordmann, MD as Referring Physician (Hematology and Oncology) Katelyn Rudd, MD as Consulting Physician (Radiation Oncology) Katelyn Limbo, MD as Consulting Physician (Plastic Surgery) Shelton, Katelyn Goltz, MD as Referring Physician (Hematology and Oncology) Shelton, Katelyn Callander as Consulting Physician (Internal Medicine) Katelyn Shelton Ducking, MD as Consulting Physician (Neurology) Katelyn Spark, MD as Consulting Physician (Cardiology) OTHER MD:  CHIEF COMPLAINT: Estrogen receptor positive breast cancer (s/p bilateral mastectomies)  CURRENT TREATMENT: Exemestane   INTERVAL HISTORY: Katelyn Shelton Shelton returns today for follow up of Katelyn Shelton estrogen receptor positive breast cancer.   She continues on exemestane.  She feels she is doing well with this at present.  Vaginal dryness continues to be an issue.  Hot flashes are only occasional.  Katelyn Shelton Shelton receives Prolia every 6 months with labs prior. She has a dose due today.  Katelyn Shelton most recent bone density screening on 05/28/2020 showed a T-score of -1.7, which is considered osteopenic.  This is improved  Since Katelyn Shelton last visit, she underwent breast MRI on 07/15/2020 showing: breast composition A; no evidence of malignancy or implant rupture.   REVIEW OF SYSTEMS: Katelyn Shelton Shelton feels Katelyn Shelton right reconstructed breast is a little bit tight.  She wonders if she should have that revised.  She has discussed that at Katelyn Shelton [facility] and they have told Katelyn Shelton she is not a candidate forDIEP reconstruction and referred Katelyn Shelton for some experimental surgery which she rightly is not  interested in.  Katelyn Shelton discomfort on Katelyn Shelton right side is moderate" she can deal with it".  She walks 2 to 3 miles most days.  A detailed review of systems today was otherwise stable   COVID 19 VACCINATION STATUS: infection 11/02/2020; status post Moderna x2 with booster September 2021   BREAST CANCER HISTORY: From Katelyn Shelton original intake note:  "Katelyn Shelton Shelton" saw Dr. Corinna Shelton for routine follow-up and was found to have a palpable mass in Katelyn Shelton upper outer quadrant of Katelyn Shelton right breast and was set up for bilateral diagnostic mammography with tomography and right breast ultrasonography at Katelyn Shelton [facility] 02/08/2017. Katelyn Shelton breast density was category C. In Katelyn Shelton upper outer quadrant of Katelyn Shelton right breast there was an irregular mass. There was Shelton a 0.3 cm group of slightly heterogeneous calcifications in Katelyn Shelton retroareolar left breast. On exam there was indeed a firm palpable mass at Katelyn Shelton 10:00 position of Katelyn Shelton right breast 4 cm from Katelyn Shelton nipple. On ultrasound this measured 3.5 cm, it was irregular and hypoechoic. There was no abnormal right axillary adenopathy.  Biopsy of Katelyn Shelton right breast mass in question 02/08/2017 showed invasive ductal carcinoma, grade 2, estrogen receptor Shelton% positive, Shelton% positive, both with strong staining intensity, with an MIB-1 of 5%, and no Katelyn Shelton-2 amplification by immunohistochemistry (1+). Katelyn Shelton area of left breast calcifications was biopsied 02/09/2017 and showed only fibrocystic changes. ([id number] [phone] [id number]).  Katelyn Shelton subsequent history is as detailed below.   PAST MEDICAL HISTORY: Past Medical History:  Diagnosis Date  . Allergy   . Anxiety    venflafaxine XR 75 mg  . Breast cancer ([place]) 01/2017   right; genetic testing negative in 2018 with [facility] panel  . Heart murmur  as child- went away  . History of MRSA infection    elbow  . Hyperlipidemia   . Joint pain    due to medicine induced joint pain  . Migraine    otc med prn  . Miscarriage    x1  . Neuromuscular disorder (Katelyn Shel[place])     neuropathy bilater feet - ? 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 Osteopenia   . UTI (urinary tract infection)     PAST SURGICAL HISTORY: Past Surgical History:  Procedure Laterality Date  . AREOLA/NIPPLE RECONSTRUCTION WITH GRAFT Right 12/01/2018   Procedure: RIGHT NIPPLE AREOLA COMPLEX RECONSTRUCTION WITH FULL THICKNESS SKIN GRAFT FROM RIGHT GROIN;  Surgeon: Katelyn Limbo, MD;  Location: [place];  Service: Plastics;  Laterality: Right;  . BREAST RECONSTRUCTION Left 12/01/2018   Procedure: LEFT BREAST REVISION RECONSTRUCTION WITH SKIN EXCISION;  Surgeon: Katelyn Limbo, MD;  Location: [place];  Service: Plastics;  Laterality: Left;  . BREAST RECONSTRUCTION Left 12/01/2018   Procedure: COMPOSITE GRAFT FROM LEFT NIPPLE (NIPPLE SHARING);  Surgeon: Katelyn Limbo, MD;  Location: [place];  Service: Plastics;  Laterality: Left;  . BREAST RECONSTRUCTION WITH PLACEMENT OF TISSUE EXPANDER AND FLEX HD (ACELLULAR HYDRATED DERMIS) Bilateral 02/28/2017   Procedure: BILATERAL BREAST RECONSTRUCTION WITH PLACEMENT OF TISSUE EXPANDER AND ALLODERM;  Surgeon: Katelyn Limbo, MD;  Location: [place];  Service: Plastics;  Laterality: Bilateral;  . CESAREAN SECTION  2008,2010,2013   x 3  . CESAREAN SECTION  02/04/2012   Procedure: CESAREAN SECTION;  Surgeon: Cyril Mourning, MD;  Location: [facility];  Service: Gynecology;  Laterality: N/A;  Repeat Cesarean Section Delivery  Boy  @  [phone], Apgars 9/10  . COLONOSCOPY     polyp  . DILATION AND CURETTAGE OF UTERUS    . EXCISION OF BREAST LESION Right 03/15/2017   Procedure: RIGHT NIPPLE AND AREOLA EXCISION;  Surgeon: Katelyn Bookbinder, MD;  Location: [place];  Service: General;  Laterality: Right;  . LAPAROSCOPIC VAGINAL HYSTERECTOMY WITH SALPINGO OOPHORECTOMY Bilateral 06/29/2018   Procedure: LAPAROSCOPIC ASSISTED VAGINAL HYSTERECTOMY WITH SALPINGO OOPHORECTOMY;  Surgeon: Katelyn Shelton Shorten, MD;  Location:  [facility];  Service: Gynecology;  Laterality: Bilateral;  Request case for [facility] CRNA  . LIPOSUCTION WITH LIPOFILLING N/A 07/25/2018   Procedure: LIPOSUCTION WITH LIPOFILLING FROM ABDOMEN TO CHEST;  Surgeon: Katelyn Limbo, MD;  Location: [place];  Service: Plastics;  Laterality: N/A;  . LIPOSUCTION WITH LIPOFILLING Bilateral 12/01/2018   Procedure: LIPOFILLING TO BILATERAL CHEST REVISION;  Surgeon: Katelyn Limbo, MD;  Location: [place];  Service: Plastics;  Laterality: Bilateral;  . NIPPLE SPARING MASTECTOMY Bilateral 02/28/2017   Procedure: RIGHT NIPPLE SPARING MASTECTOMY; LEFT PROPHYLACTIC NIPPLE SPARING MASTECTOMY;  Surgeon: Katelyn Bookbinder, MD;  Location: [place];  Service: General;  Laterality: Bilateral;  . PECTUS EXCAVATUM REPAIR  [id number]   45 years old- very large implant  . REMOVAL OF TISSUE EXPANDER AND PLACEMENT OF IMPLANT Bilateral 07/25/2018   Procedure: REMOVAL OF TISSUE EXPANDER AND PLACEMENT OF SILICONE IMPLANT;  Surgeon: Katelyn Limbo, MD;  Location: [place];  Service: Plastics;  Laterality: Bilateral;  . SENTINEL NODE BIOPSY Right 02/28/2017   Procedure: RIGHT AXILLARY SENTINEL LYMPH  NODE BIOPSY;  Surgeon: Katelyn Bookbinder, MD;  Location: [place];  Service: General;  Laterality: Right;  . VULVAR LESION REMOVAL    . WISDOM TOOTH EXTRACTION      FAMILY HISTORY Family History  Problem Relation Age  of Onset  . Skin cancer Shelton   . Hyperlipidemia Shelton   . Hypertension Shelton   . Colon polyps Shelton   . Hyperlipidemia Father   . Parkinson's disease Sister 87       early on set  . Miscarriages / Stillbirths Sister   . Diabetes Maternal Aunt   . Colon polyps Maternal Uncle   . Heart failure Maternal Grandmother   . Stroke Maternal Grandmother   . Heart attack Maternal Grandfather   . Depression Maternal Grandfather   . Early death Maternal Grandfather        60 MI  .  Stomach cancer Paternal Grandmother   . Prostate cancer Paternal Grandfather   . Alzheimer's disease Paternal Grandfather   . Breast cancer Cousin        Shelton's paternal first FEMALE cousin  . Colon polyps Sister   . Colon cancer Neg Hx   . Rectal cancer Neg Hx   Katelyn Shelton Shelton's father, Carolyne Fiscal, is an Administrator, Civil Service, currently 44 years old. Katelyn Shelton Shelton is 45 years old as of April 2018. Shelton has no brothers, 2 sisters. There is a history breast cancer in Katelyn Shelton family in a great aunt on Katelyn Shelton Shelton's father's side. On Katelyn Shelton maternal side there is a female first cousin of Katelyn Shelton Shelton with breast cancer. There is no history of ovarian cancer in Katelyn Shelton family   GYNECOLOGIC HISTORY:  Shelton's last menstrual period was 09/01/2017. Katelyn Shelton Shelton, Katelyn Shelton first live birth age 24. She has had 3 C-sections. Katelyn Shelton periods are approximately every 21 days, with one heavy day. Katelyn Shelton husband is status post vasectomy.   SOCIAL HISTORY:  Katelyn Shelton Shelton but is currently taking care of [place], [place] and [place]. 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 Katelyn Shelton Shelton attends [employer] DIRECTIVES: In Katelyn Shelton absence of any documentation to Katelyn Shelton contrary, Katelyn Shelton Shelton's spouse is their HCPOA.    HEALTH MAINTENANCE: Social History   Tobacco Use  . Smoking status: Never Smoker  . Smokeless tobacco: Never Used  Vaping Use  . Vaping Use: Never used  Substance Use Topics  . Alcohol use: Yes    Alcohol/week: 1.0 - 2.0 standard drink    Types: 1 - 2 Standard drinks or equivalent per week    Comment: occasional wine - twice monthly  . Drug use: No     Colonoscopy:  PAP:  Bone density: 11/25/2017 showed a T score of -2.0 osteopenic   Allergies  Allergen Reactions  . Fentanyl Itching    Severe itching - Shelton refuses this medication  . Peach Flavor Anaphylaxis  . Penicillins Hives    AS A CHILD  . Sulfa Antibiotics Nausea And  Vomiting  . Sulfasalazine Nausea And Vomiting and Other (See Comments)  . Latex Itching    Current Outpatient Medications  Medication Sig Dispense Refill  . Calcium Carbonate (CALCIUM 600 PO) Take 1 capsule by mouth daily.     [name] denosumab (PROLIA) 60 MG/ML SOSY injection Inject 60 mg into Katelyn Shelton skin every 6 (six) months.    [name] exemestane (AROMASIN) 25 MG tablet Take 1 tablet (25 mg total) by mouth daily after breakfast. Shelton tablet 3  . Multiple Vitamins-Minerals (MULTIVITAMIN WOMEN 50+) TABS Take 1 tablet by mouth daily.     [name] REPATHA SURECLICK 144 MG/ML SOAJ INJECT 1 PEN INTO Katelyn Shelton SKIN EVERY 14 DAYS. 2 mL 11  . venlafaxine  XR (EFFEXOR-XR) 37.5 MG 24 hr capsule TAKE 1 CAPSULE BY MOUTH DAILY WITH BREAKFAST Shelton capsule 4   No current facility-administered medications for this visit.    OBJECTIVE: white woman who appears younger than stated age 12:   01/15/21 1020  BP: 134/80  Pulse: 70  Resp: 20  Temp: (!) 97.3 F (36.3 C)  SpO2: 98%   Body mass index is 29.16 kg/m.  ECOG FS:1 - Symptomatic but completely ambulatory   Sclerae unicteric, EOMs intact Wearing a mask No cervical or supraclavicular adenopathy Lungs no rales or rhonchi Heart regular rate and rhythm Abd soft, nontender, positive bowel sounds MSK no focal spinal tenderness, no upper extremity lymphedema Neuro: nonfocal, well oriented, appropriate affect Breasts: Status post bilateral mastectomies with bilateral reconstruction.  Katelyn Shelton cosmetic result is excellent.  While there is a little bit more tenderness on Katelyn Shelton right side than Katelyn Shelton left this is not extreme and there is no evidence of local recurrence.  Both axillae are benign  LAB RESULTS:  CMP     Component Value Date/Time   NA 141 01/15/2021 0936   NA 140 08/19/2017 1337   K 4.3 01/15/2021 0936   K 3.6 08/19/2017 1337   CL 105 01/15/2021 0936   CO2 24 01/15/2021 0936   CO2 24 08/19/2017 1337   GLUCOSE 103 (H) 01/15/2021 0936   GLUCOSE 88 08/19/2017 1337    BUN 12 01/15/2021 0936   BUN 10.9 08/19/2017 1337   CREATININE 0.76 01/15/2021 0936   CREATININE 0.7 08/19/2017 1337   CALCIUM 9.4 01/15/2021 0936   CALCIUM 9.0 08/19/2017 1337   PROT 7.6 01/15/2021 0936   PROT 7.0 06/22/2018 0817   PROT 6.6 08/19/2017 1337   ALBUMIN 4.2 01/15/2021 0936   ALBUMIN 4.6 06/22/2018 0817   ALBUMIN 3.9 08/19/2017 1337   AST 38 01/15/2021 0936   AST 35 (H) 08/19/2017 1337   ALT 68 (H) 01/15/2021 0936   ALT 55 08/19/2017 1337   ALKPHOS 48 01/15/2021 0936   ALKPHOS 61 08/19/2017 1337   BILITOT 0.5 01/15/2021 0936   BILITOT 0.41 08/19/2017 1337   GFRNONAA >60 01/15/2021 0936   GFRAA >60 07/10/2020 0949    No results found for: Ronnald Ramp, A1GS, A2GS, BETS, BETA2SER, GAMS, MSPIKE, SPEI  No results found for: Nils Pyle, [facility]  Lab Results  Component Value Date   WBC 5.0 01/15/2021   NEUTROABS 2.8 01/15/2021   HGB 14.4 01/15/2021   HCT 41.4 01/15/2021   MCV 93.2 01/15/2021   PLT 250 01/15/2021      Chemistry      Component Value Date/Time   NA 141 01/15/2021 0936   NA 140 08/19/2017 1337   K 4.3 01/15/2021 0936   K 3.6 08/19/2017 1337   CL 105 01/15/2021 0936   CO2 24 01/15/2021 0936   CO2 24 08/19/2017 1337   BUN 12 01/15/2021 0936   BUN 10.9 08/19/2017 1337   CREATININE 0.76 01/15/2021 0936   CREATININE 0.7 08/19/2017 1337      Component Value Date/Time   CALCIUM 9.4 01/15/2021 0936   CALCIUM 9.0 08/19/2017 1337   ALKPHOS 48 01/15/2021 0936   ALKPHOS 61 08/19/2017 1337   AST 38 01/15/2021 0936   AST 35 (H) 08/19/2017 1337   ALT 68 (H) 01/15/2021 0936   ALT 55 08/19/2017 1337   BILITOT 0.5 01/15/2021 0936   BILITOT 0.41 08/19/2017 1337       No results found for: LABCA2  No components found for:  [id number]  No results for input(s): INR in Katelyn Shelton last 168 hours.  Urinalysis    Component Value Date/Time   COLORURINE YELLOW 08/26/2011 1652   APPEARANCEUR CLEAR 08/26/2011 1652   LABSPEC  1.025 12/21/2013 1226   PHURINE 6.0 12/21/2013 1226   GLUCOSEU NEGATIVE 12/21/2013 1226   HGBUR TRACE (A) 12/21/2013 1226   BILIRUBINUR Negative 05/16/2018 1129   KETONESUR NEGATIVE 12/21/2013 1226   PROTEINUR Negative 05/16/2018 1129   PROTEINUR NEGATIVE 12/21/2013 1226   UROBILINOGEN 0.2 05/16/2018 1129   UROBILINOGEN 0.2 12/21/2013 1226   NITRITE Negative 05/16/2018 1129   NITRITE NEGATIVE 12/21/2013 1226   LEUKOCYTESUR Moderate (2+) (A) 05/16/2018 1129    STUDIES: No results found.   ELIGIBLE FOR AVAILABLE RESEARCH PROTOCOL: no  ASSESSMENT: 45 y.o. [place] woman status post right breast upper outer quadrant biopsy 02/08/2017 for a clinical T2 N0, stage IB invasive ductal carcinoma, grade 2, estrogen and progesterone receptor positive, Katelyn Shelton-2 nonamplified, with an MIB-1 of 5%.  (a) biopsy of upper outer quadrant calcifications in Katelyn Shelton left breast 02/09/2017 were benign  (1) Oncotype DX obtained from Katelyn Shelton biopsy sample showed a recurrence score of 11 predicting a 10 year risk of recurrence outside Katelyn Shelton breast of 9% if Katelyn Shelton Shelton's only systemic therapy is tamoxifen for 5 years (node positive report)  (2) bilateral mastectomies with right sentinel lymph node sampling 02/28/2017 showed  (a) on Katelyn Shelton left, intraductal papilloma, with no evidence of malignancy.  (b) on Katelyn Shelton right, a pT2 pN1 stage IIA invasive ductal carcinoma, grade 1, with close margins  (c) additional excision of Katelyn Shelton right nipple areolar area 03/15/2017 found no residual carcinoma  (d) status post bilateral definitive silicone implant placement 07/25/2018  (3) Mammaprint study on Katelyn Shelton final surgical sample came back low risk, predicting a five-year distant disease-free survival of 97.8% with hormone therapy alone   (3) tamoxifen started neoadjuvantly 02/11/2017, Stopped 04/11/2017 in preparation for chemotherapy  (4) genetics counseling 02/19/2017 showed no deleterious mutations in Katelyn Shelton STAT gene panel offered by  [facility] including ATM, BRCA1, BRCA2, CDH1, CHEK2, PALB2, PTEN, STK11, and TP53.  (a) multi gene panel February 21, 2017 through Katelyn Shelton  Multi-Gene Panel offered by [facility] found no deleterious mutations in ALK, APC, ATM, AXIN2,BAP1,  BARD1, BLM, BMPR1A, BRCA1, BRCA2, BRIP1, CASR, CDC73, CDH1, CDK4, CDKN1B, CDKN1C, CDKN2A (p14ARF), CDKN2A (p16INK4a), CEBPA, CHEK2, CTNNA1, DICER1, DIS3L2, EGFR (c.2369C>T, p.Thr790Met variant only), EPCAM (Deletion/duplication testing only), FH, FLCN, GATA2, GPC3, GREM1 (Promoter region deletion/duplication testing only), HOXB13 (c.251G>A, p.Gly84Glu), HRAS, KIT, MAX, MEN1, MET, MITF (c.952G>A, p.Glu318Lys variant only), MLH1, MSH2, MSH3, MSH6, MUTYH, NBN, NF1, NF2, NTHL1, PALB2, PDGFRA, PHOX2B, PMS2, POLD1, POLE, POT1, PRKAR1A, PTCH1, PTEN, RAD50, RAD51C, RAD51D, RB1, RECQL4, RET, RUNX1, SDHAF2, SDHA (sequence changes only), SDHB, SDHC, SDHD, SMAD4, SMARCA4, SMARCB1, SMARCE1, STK11, SUFU, TERT, TERT, TMEM127, TP53, TSC1, TSC2, VHL, WRN and WT1.   (5) adjuvant chemotherapy consisting of cyclophosphamide and docetaxel given every 21 days 4 started 04/15/2017, completed August 03, 2017 at [facility]  (6) adjuvant radiation completed 09/30/2017  (7) anastrozole started 10/25/2017  (a) ovarian suppression with monthly goserelin October 2018 through August 2019  (b) bone density on 11/25/2017 showed a T score of -2.0 osteopenic  (c) status post hysterectomy and bilateral salpingo-oophorectomy 06/29/2018, with benign pathology  (d) denosumab/Prolia started 12/30/2017  (e) repeat bone density 05/28/2020 shows a T score of -1.7 (improved)  (8) history of adrenal hyperplasia, 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  (9) significant hypercholesterolemia  (10) CT scan for cardiac scoring 03/30/2018 showed clustered  nodules in Katelyn Shelton inferior right upper lobe.  This was felt to be most likely inflammatory.    (a) repeat CT of Katelyn Shelton chest on 10/10/2018 shows subpleural nodular scarring in Katelyn Shelton  anterolateral right upper and middle lobes, unchanged from prior, compatible with radiation changes.  No findings suspicious for recurrent or metastatic disease.     PLAN: Katelyn Shelton Shelton is now 4 years out from Katelyn Shelton bilateral mastectomies with no evidence of disease recurrence.  This is very favorable.  She continues on antiestrogens and Katelyn Shelton plan is to continue exemestane for a minimum of 5 years, maximum of 7 years.  We discussed baths very minimal ALT elevation.  This is stable.  It is very likely due to "fatty liver".  We discussed that at length and she understands that Katelyn Shelton treatment for this is basically Katelyn Shelton same as for prediabetes namely exercise which she is already doing and a low-carb diet which she is going to consider.  She will see me again in 6 months when we give Katelyn Shelton Katelyn Shelton next Prolia dose and we will repeat Katelyn Shelton LFTs at that time  Total encounter time 30 minutes.Sarajane Jews C. Omarrion Carmer, MD  01/15/21 2:45 PM Medical Oncology and Hematology [facility] [place], [place] [address] Tel. [phone]    Fax. [phone]   I, Wilburn Mylar, am acting as scribe for Dr. Virgie Shelton. Graysin Luczynski.  I, Lurline Del MD, have reviewed Katelyn Shelton above documentation for accuracy and completeness, and I agree with Katelyn Shelton above.   *Total Encounter Time as defined by Katelyn [facility] [employer] includes, in addition to Katelyn Shelton face-to-face time of a Shelton visit (documented in Katelyn Shelton note above) non-face-to-face time: obtaining and reviewing outside history, ordering and reviewing medications, tests or procedures, care coordination (communications with other health care professionals or caregivers) and documentation in Katelyn Shelton medical record.

## 2021-01-15 ENCOUNTER — Inpatient Hospital Stay: Payer: 59 | Attending: Oncology | Admitting: Oncology

## 2021-01-15 ENCOUNTER — Other Ambulatory Visit: Payer: Self-pay | Admitting: Oncology

## 2021-01-15 ENCOUNTER — Telehealth: Payer: Self-pay | Admitting: Oncology

## 2021-01-15 ENCOUNTER — Other Ambulatory Visit: Payer: Self-pay

## 2021-01-15 ENCOUNTER — Inpatient Hospital Stay: Payer: 59

## 2021-01-15 VITALS — BP 134/80 | HR 70 | Temp 97.3°F | Resp 20 | Ht 60.0 in | Wt 149.3 lb

## 2021-01-15 DIAGNOSIS — Z8249 Family history of ischemic heart disease and other diseases of the circulatory system: Secondary | ICD-10-CM | POA: Diagnosis not present

## 2021-01-15 DIAGNOSIS — Z79899 Other long term (current) drug therapy: Secondary | ICD-10-CM | POA: Diagnosis not present

## 2021-01-15 DIAGNOSIS — Z79811 Long term (current) use of aromatase inhibitors: Secondary | ICD-10-CM | POA: Insufficient documentation

## 2021-01-15 DIAGNOSIS — Z923 Personal history of irradiation: Secondary | ICD-10-CM | POA: Diagnosis not present

## 2021-01-15 DIAGNOSIS — M8580 Other specified disorders of bone density and structure, unspecified site: Secondary | ICD-10-CM | POA: Insufficient documentation

## 2021-01-15 DIAGNOSIS — Z9221 Personal history of antineoplastic chemotherapy: Secondary | ICD-10-CM | POA: Insufficient documentation

## 2021-01-15 DIAGNOSIS — M8588 Other specified disorders of bone density and structure, other site: Secondary | ICD-10-CM | POA: Diagnosis not present

## 2021-01-15 DIAGNOSIS — Z170 Estrogen receptor positive status [ER+]: Secondary | ICD-10-CM | POA: Diagnosis not present

## 2021-01-15 DIAGNOSIS — Z833 Family history of diabetes mellitus: Secondary | ICD-10-CM | POA: Diagnosis not present

## 2021-01-15 DIAGNOSIS — Z90722 Acquired absence of ovaries, bilateral: Secondary | ICD-10-CM | POA: Insufficient documentation

## 2021-01-15 DIAGNOSIS — Z9013 Acquired absence of bilateral breasts and nipples: Secondary | ICD-10-CM | POA: Diagnosis not present

## 2021-01-15 DIAGNOSIS — Z8042 Family history of malignant neoplasm of prostate: Secondary | ICD-10-CM | POA: Insufficient documentation

## 2021-01-15 DIAGNOSIS — Z9079 Acquired absence of other genital organ(s): Secondary | ICD-10-CM | POA: Insufficient documentation

## 2021-01-15 DIAGNOSIS — C50411 Malignant neoplasm of upper-outer quadrant of right female breast: Secondary | ICD-10-CM | POA: Insufficient documentation

## 2021-01-15 DIAGNOSIS — Z90710 Acquired absence of both cervix and uterus: Secondary | ICD-10-CM | POA: Insufficient documentation

## 2021-01-15 DIAGNOSIS — E7800 Pure hypercholesterolemia, unspecified: Secondary | ICD-10-CM | POA: Diagnosis not present

## 2021-01-15 DIAGNOSIS — E278 Other specified disorders of adrenal gland: Secondary | ICD-10-CM | POA: Insufficient documentation

## 2021-01-15 DIAGNOSIS — Z800 Family history of malignant neoplasm of digestive organs: Secondary | ICD-10-CM | POA: Diagnosis not present

## 2021-01-15 DIAGNOSIS — Z8349 Family history of other endocrine, nutritional and metabolic diseases: Secondary | ICD-10-CM | POA: Diagnosis not present

## 2021-01-15 LAB — CBC WITH DIFFERENTIAL (CANCER CENTER ONLY)
Abs Immature Granulocytes: 0.02 10*3/uL (ref 0.00–0.07)
Basophils Absolute: 0.1 10*3/uL (ref 0.0–0.1)
Basophils Relative: 1 %
Eosinophils Absolute: 0.1 10*3/uL (ref 0.0–0.5)
Eosinophils Relative: 2 %
HCT: 41.4 % (ref 36.0–46.0)
Hemoglobin: 14.4 g/dL (ref 12.0–15.0)
Immature Granulocytes: 0 %
Lymphocytes Relative: 29 %
Lymphs Abs: 1.5 10*3/uL (ref 0.7–4.0)
MCH: 32.4 pg (ref 26.0–34.0)
MCHC: 34.8 g/dL (ref 30.0–36.0)
MCV: 93.2 fL (ref 80.0–100.0)
Monocytes Absolute: 0.5 10*3/uL (ref 0.1–1.0)
Monocytes Relative: 11 %
Neutro Abs: 2.8 10*3/uL (ref 1.7–7.7)
Neutrophils Relative %: 57 %
Platelet Count: 250 10*3/uL (ref 150–400)
RBC: 4.44 MIL/uL (ref 3.87–5.11)
RDW: 12.2 % (ref 11.5–15.5)
WBC Count: 5 10*3/uL (ref 4.0–10.5)
nRBC: 0 % (ref 0.0–0.2)

## 2021-01-15 LAB — CMP (CANCER CENTER ONLY)
ALT: 68 U/L — ABNORMAL HIGH (ref 0–44)
AST: 38 U/L (ref 15–41)
Albumin: 4.2 g/dL (ref 3.5–5.0)
Alkaline Phosphatase: 48 U/L (ref 38–126)
Anion gap: 12 (ref 5–15)
BUN: 12 mg/dL (ref 6–20)
CO2: 24 mmol/L (ref 22–32)
Calcium: 9.4 mg/dL (ref 8.9–10.3)
Chloride: 105 mmol/L (ref 98–111)
Creatinine: 0.76 mg/dL (ref 0.44–1.00)
GFR, Estimated: >60 mL/min (ref >60)
Glucose, Bld: 103 mg/dL — ABNORMAL HIGH (ref 70–99)
Potassium: 4.3 mmol/L (ref 3.5–5.1)
Sodium: 141 mmol/L (ref 135–145)
Total Bilirubin: 0.5 mg/dL (ref 0.3–1.2)
Total Protein: 7.6 g/dL (ref 6.5–8.1)

## 2021-01-15 MED ORDER — DENOSUMAB 60 MG/ML ~~LOC~~ SOSY
60.0000 mg | PREFILLED_SYRINGE | Freq: Once | SUBCUTANEOUS | Status: AC
  Administered 2021-01-15: 60 mg via SUBCUTANEOUS

## 2021-01-15 MED ORDER — DENOSUMAB 60 MG/ML ~~LOC~~ SOLN: 60.0000 mg | Freq: Once | SUBCUTANEOUS | Status: DC

## 2021-01-15 MED ORDER — DENOSUMAB 60 MG/ML ~~LOC~~ SOSY
PREFILLED_SYRINGE | SUBCUTANEOUS | Status: AC
  Filled 2021-01-15: qty 1

## 2021-01-15 MED ORDER — EXEMESTANE 25 MG PO TABS: 25.0000 mg | ORAL_TABLET | Freq: Every day | ORAL | 3 refills | Status: DC

## 2021-01-15 NOTE — Patient Instructions (Signed)
Denosumab injection °What is this medicine? °DENOSUMAB (den oh sue mab) slows bone breakdown. Prolia is used to treat osteoporosis in women after menopause and in men, and in people who are taking corticosteroids for 6 months or more. Xgeva is used to treat a high calcium level due to cancer and to prevent bone fractures and other bone problems caused by multiple myeloma or cancer bone metastases. Xgeva is also used to treat giant cell tumor of the bone. °This medicine may be used for other purposes; ask your health care provider or pharmacist if you have questions. °COMMON BRAND NAME(S): Prolia, XGEVA °What should I tell my health care provider before I take this medicine? °They need to know if you have any of these conditions: °· dental disease °· having surgery or tooth extraction °· infection °· kidney disease °· low levels of calcium or Vitamin D in the blood °· malnutrition °· on hemodialysis °· skin conditions or sensitivity °· thyroid or parathyroid disease °· an unusual reaction to denosumab, other medicines, foods, dyes, or preservatives °· pregnant or trying to get pregnant °· breast-feeding °How should I use this medicine? °This medicine is for injection under the skin. It is given by a health care professional in a hospital or clinic setting. °A special MedGuide will be given to you before each treatment. Be sure to read this information carefully each time. °For Prolia, talk to your pediatrician regarding the use of this medicine in children. Special care may be needed. For Xgeva, talk to your pediatrician regarding the use of this medicine in children. While this drug may be prescribed for children as young as 13 years for selected conditions, precautions do apply. °Overdosage: If you think you have taken too much of this medicine contact a poison control center or emergency room at once. °NOTE: This medicine is only for you. Do not share this medicine with others. °What if I miss a dose? °It is  important not to miss your dose. Call your doctor or health care professional if you are unable to keep an appointment. °What may interact with this medicine? °Do not take this medicine with any of the following medications: °· other medicines containing denosumab °This medicine may also interact with the following medications: °· medicines that lower your chance of fighting infection °· steroid medicines like prednisone or cortisone °This list may not describe all possible interactions. Give your health care provider a list of all the medicines, herbs, non-prescription drugs, or dietary supplements you use. Also tell them if you smoke, drink alcohol, or use illegal drugs. Some items may interact with your medicine. °What should I watch for while using this medicine? °Visit your doctor or health care professional for regular checks on your progress. Your doctor or health care professional may order blood tests and other tests to see how you are doing. °Call your doctor or health care professional for advice if you get a fever, chills or sore throat, or other symptoms of a cold or flu. Do not treat yourself. This drug may decrease your body's ability to fight infection. Try to avoid being around people who are sick. °You should make sure you get enough calcium and vitamin D while you are taking this medicine, unless your doctor tells you not to. Discuss the foods you eat and the vitamins you take with your health care professional. °See your dentist regularly. Brush and floss your teeth as directed. Before you have any dental work done, tell your dentist you are   receiving this medicine. Do not become pregnant while taking this medicine or for 5 months after stopping it. Talk with your doctor or health care professional about your birth control options while taking this medicine. Women should inform their doctor if they wish to become pregnant or think they might be pregnant. There is a potential for serious side  effects to an unborn child. Talk to your health care professional or pharmacist for more information. What side effects may I notice from receiving this medicine? Side effects that you should report to your doctor or health care professional as soon as possible:  allergic reactions like skin rash, itching or hives, swelling of the face, lips, or tongue  bone pain  breathing problems  dizziness  jaw pain, especially after dental work  redness, blistering, peeling of the skin  signs and symptoms of infection like fever or chills; cough; sore throat; pain or trouble passing urine  signs of low calcium like fast heartbeat, muscle cramps or muscle pain; pain, tingling, numbness in the hands or feet; seizures  unusual bleeding or bruising  unusually weak or tired Side effects that usually do not require medical attention (report to your doctor or health care professional if they continue or are bothersome):  constipation  diarrhea  headache  joint pain  loss of appetite  muscle pain  runny nose  tiredness  upset stomach This list may not describe all possible side effects. Call your doctor for medical advice about side effects. You may report side effects to FDA at 1-800-FDA-1088. Where should I keep my medicine? This medicine is only given in a clinic, doctor's office, or other health care setting and will not be stored at home. NOTE: This sheet is a summary. It may not cover all possible information. If you have questions about this medicine, talk to your doctor, pharmacist, or health care provider.  2021 Elsevier/Gold Standard (2018-02-17 16:10:44)

## 2021-01-15 NOTE — Telephone Encounter (Signed)
Scheduled appts per 3/24 los. Pt aware.

## 2021-01-18 ENCOUNTER — Encounter: Payer: Self-pay | Admitting: Infectious Diseases

## 2021-01-18 NOTE — Progress Notes (Signed)
Messaged by provider. Pts breast erythema has returned. I refilled her zyvox, asked her strop ssri, calcium. Will work on asap appt, see if we can get her oritavancin again.

## 2021-01-19 ENCOUNTER — Encounter: Payer: Self-pay | Admitting: Infectious Disease

## 2021-01-19 ENCOUNTER — Other Ambulatory Visit: Payer: Self-pay

## 2021-01-19 ENCOUNTER — Ambulatory Visit (INDEPENDENT_AMBULATORY_CARE_PROVIDER_SITE_OTHER): Payer: 59 | Admitting: Infectious Disease

## 2021-01-19 ENCOUNTER — Other Ambulatory Visit: Payer: Self-pay | Admitting: Infectious Disease

## 2021-01-19 VITALS — BP 126/79 | HR 102 | Temp 98.2°F

## 2021-01-19 DIAGNOSIS — C50411 Malignant neoplasm of upper-outer quadrant of right female breast: Secondary | ICD-10-CM

## 2021-01-19 DIAGNOSIS — Z853 Personal history of malignant neoplasm of breast: Secondary | ICD-10-CM | POA: Diagnosis not present

## 2021-01-19 DIAGNOSIS — R112 Nausea with vomiting, unspecified: Secondary | ICD-10-CM | POA: Diagnosis not present

## 2021-01-19 DIAGNOSIS — T8579XA Infection and inflammatory reaction due to other internal prosthetic devices, implants and grafts, initial encounter: Secondary | ICD-10-CM | POA: Diagnosis not present

## 2021-01-19 DIAGNOSIS — Z170 Estrogen receptor positive status [ER+]: Secondary | ICD-10-CM

## 2021-01-19 DIAGNOSIS — R630 Anorexia: Secondary | ICD-10-CM | POA: Diagnosis not present

## 2021-01-19 DIAGNOSIS — N61 Mastitis without abscess: Secondary | ICD-10-CM

## 2021-01-19 DIAGNOSIS — Z9882 Breast implant status: Secondary | ICD-10-CM

## 2021-01-19 HISTORY — DX: Infection and inflammatory reaction due to other internal prosthetic devices, implants and grafts, initial encounter: T85.79XA

## 2021-01-19 HISTORY — DX: Nausea with vomiting, unspecified: R11.2

## 2021-01-19 LAB — BASIC METABOLIC PANEL
Anion gap: 9 (ref 5–15)
BUN: 12 mg/dL (ref 6–20)
CO2: 25 mmol/L (ref 22–32)
Calcium: 8.4 mg/dL — ABNORMAL LOW (ref 8.9–10.3)
Chloride: 103 mmol/L (ref 98–111)
Creatinine, Ser: 0.72 mg/dL (ref 0.44–1.00)
GFR, Estimated: >60 mL/min (ref >60)
Glucose, Bld: 117 mg/dL — ABNORMAL HIGH (ref 70–99)
Potassium: 3.6 mmol/L (ref 3.5–5.1)
Sodium: 137 mmol/L (ref 135–145)

## 2021-01-19 LAB — CBC WITH DIFFERENTIAL/PLATELET
Abs Immature Granulocytes: 0.42 10*3/uL — ABNORMAL HIGH (ref 0.00–0.07)
Basophils Absolute: 0.1 10*3/uL (ref 0.0–0.1)
Basophils Relative: 0 %
Eosinophils Absolute: 0 10*3/uL (ref 0.0–0.5)
Eosinophils Relative: 0 %
HCT: 40.9 % (ref 36.0–46.0)
Hemoglobin: 13.9 g/dL (ref 12.0–15.0)
Immature Granulocytes: 3 %
Lymphocytes Relative: 4 %
Lymphs Abs: 0.6 10*3/uL — ABNORMAL LOW (ref 0.7–4.0)
MCH: 32.7 pg (ref 26.0–34.0)
MCHC: 34 g/dL (ref 30.0–36.0)
MCV: 96.2 fL (ref 80.0–100.0)
Monocytes Absolute: 1.2 10*3/uL — ABNORMAL HIGH (ref 0.1–1.0)
Monocytes Relative: 7 %
Neutro Abs: 14.5 10*3/uL — ABNORMAL HIGH (ref 1.7–7.7)
Neutrophils Relative %: 86 %
Platelets: 197 10*3/uL (ref 150–400)
RBC: 4.25 MIL/uL (ref 3.87–5.11)
RDW: 12.8 % (ref 11.5–15.5)
WBC: 16.7 10*3/uL — ABNORMAL HIGH (ref 4.0–10.5)
nRBC: 0 % (ref 0.0–0.2)

## 2021-01-19 MED ORDER — ONDANSETRON 4 MG PO TBDP: 4.0000 mg | ORAL_TABLET | Freq: Three times a day (TID) | ORAL | 0 refills | Status: DC | PRN

## 2021-01-19 MED ORDER — ONDANSETRON HCL 4 MG/2ML IJ SOLN
4.0000 mg | Freq: Once | INTRAMUSCULAR | Status: AC
  Administered 2021-01-19: 4 mg via INTRAMUSCULAR

## 2021-01-19 MED FILL — ONDANSETRON ODT 4 MG TABLET: 4 | 20 days supply | Qty: 60 | Fill #0

## 2021-01-19 NOTE — Progress Notes (Signed)
Spoke with pt for pre-op call. Pt is feeling very bad, did not feel up to answering questions. Medical and surgical history will need to be reviewed when she comes in for surgery. Pt states she may be getting admitted in the AM prior to surgery. Dr. Tommy Medal wanted to admit her today. I did tell her if she got worse tonight to go to the ED and I suggested that she start at [facility] [address]. She voiced understanding.   She states she had Covid in January, can't remember exactly when and states she doesn't think she has any documentation for it. She will come in 3 hours prior just in case she can't find documentation. She states she got tested at [facility].

## 2021-01-19 NOTE — Progress Notes (Signed)
Subjective:  Complaints: Acute onset of tenderness and erythema in the breast with systemic symptoms of infection including body aches fevers nausea and vomiting   Patient ID: Katelyn Shelton, female    DOB: [birth year], 45 y.o.   MRN: [id number]  HPI  Katelyn Shelton is a 45 year old Caucasian female who had bilateral mastectomies for breast cancer followed by implants that were placed by [name] here in [place].  A little bit over a year ago she was seen in my office by my partner Dr. Johnnye Sima when the patient had acute onset then of erythema around the breast.  That time she had been started on ciprofloxacin by plastic surgery.  On 10 February.  She then seen by my partner Dr. Johnnye Sima on the 11th and given 24 hours of Zyvox followed by single dose of oritavancin on 12 February.  She still has some residual erythema on 17 February was given a repeat dose of oritavancin.  To be doing well but then began complaining again of some area around the breast that was erythematous and doxycycline was called in which I believe she actually took for more than the 13 days but rather has been something is to be as she was on for multiple months.  She saw Dr. Donne Hazel with general surgery I told her that if she came off the doxycycline and had recurrence of cellulitis that she would need to have her implant removed.  In the interim she is also seen Dr. Abelino Derrick at [facility].  Did not have recurrence of infection immediately after coming off the doxycycline but that was only a few months ago that she stopped it.  She said that she has had ongoing discomfort and pain in that breast even when not being treated for active infection.  She was coming back from traveling out of the city by air and had to overhead to get some baggage and had some pain in her arm and armpit.   The interim she then developed a rash below the breast that had what her father described as being a petechial appearance.  (he is also a physician).  In the interim the erythema worsened but more troublesome she began to have more systemic symptoms of malaise weakness anorexia nausea and vomiting.  Her husband Dr. [name] reached out to my partner Dr. Johnnye Sima and started this although she has had only 2 doses so far and continues to have trouble with nausea and vomiting.        Past Medical History:  Diagnosis Date  . Allergy   . Anxiety    venflafaxine XR 75 mg  . Breast cancer ([place]) 01/2017   right; genetic testing negative in 2018 with [employer] panel  . Heart murmur    as child- went away  . History of MRSA infection    elbow  . Hyperlipidemia   . Joint pain    due to medicine induced joint pain  . Migraine    otc med prn  . Miscarriage    x1  . Neuromuscular disorder ([place])    neuropathy bilater feet - ? 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 Osteopenia   . UTI (urinary tract infection)     Past Surgical History:  Procedure Laterality Date  . AREOLA/NIPPLE RECONSTRUCTION WITH GRAFT Right 12/01/2018   Procedure: RIGHT NIPPLE AREOLA COMPLEX RECONSTRUCTION WITH FULL THICKNESS SKIN GRAFT FROM RIGHT GROIN;  Surgeon: Irene Limbo, MD;  Location: [facility];  Service: Plastics;  Laterality: Right;  .  BREAST RECONSTRUCTION Left 12/01/2018   Procedure: LEFT BREAST REVISION RECONSTRUCTION WITH SKIN EXCISION;  Surgeon: Irene Limbo, MD;  Location: [place];  Service: Plastics;  Laterality: Left;  . BREAST RECONSTRUCTION Left 12/01/2018   Procedure: COMPOSITE GRAFT FROM LEFT NIPPLE (NIPPLE SHARING);  Surgeon: Irene Limbo, MD;  Location: [place];  Service: Plastics;  Laterality: Left;  . BREAST RECONSTRUCTION WITH PLACEMENT OF TISSUE EXPANDER AND FLEX HD (ACELLULAR HYDRATED DERMIS) Bilateral 02/28/2017   Procedure: BILATERAL BREAST RECONSTRUCTION WITH PLACEMENT OF TISSUE EXPANDER AND ALLODERM;  Surgeon: Irene Limbo, MD;  Location: [facility];   Service: Plastics;  Laterality: Bilateral;  . CESAREAN SECTION  2008,2010,2013   x 3  . CESAREAN SECTION  02/04/2012   Procedure: CESAREAN SECTION;  Surgeon: Cyril Mourning, MD;  Location: [facility];  Service: Gynecology;  Laterality: N/A;  Repeat Cesarean Section Delivery  Boy  @  [phone], Apgars 9/10  . COLONOSCOPY     polyp  . DILATION AND CURETTAGE OF UTERUS    . EXCISION OF BREAST LESION Right 03/15/2017   Procedure: RIGHT NIPPLE AND AREOLA EXCISION;  Surgeon: Rolm Bookbinder, MD;  Location: [place];  Service: General;  Laterality: Right;  . LAPAROSCOPIC VAGINAL HYSTERECTOMY WITH SALPINGO OOPHORECTOMY Bilateral 06/29/2018   Procedure: LAPAROSCOPIC ASSISTED VAGINAL HYSTERECTOMY WITH SALPINGO OOPHORECTOMY;  Surgeon: Louretta Shorten, MD;  Location: [facility];  Service: Gynecology;  Laterality: Bilateral;  Request case for [facility] CRNA  . LIPOSUCTION WITH LIPOFILLING N/A 07/25/2018   Procedure: LIPOSUCTION WITH LIPOFILLING FROM ABDOMEN TO CHEST;  Surgeon: Irene Limbo, MD;  Location: [place];  Service: Plastics;  Laterality: N/A;  . LIPOSUCTION WITH LIPOFILLING Bilateral 12/01/2018   Procedure: LIPOFILLING TO BILATERAL CHEST REVISION;  Surgeon: Irene Limbo, MD;  Location: [facility];  Service: Plastics;  Laterality: Bilateral;  . NIPPLE SPARING MASTECTOMY Bilateral 02/28/2017   Procedure: RIGHT NIPPLE SPARING MASTECTOMY; LEFT PROPHYLACTIC NIPPLE SPARING MASTECTOMY;  Surgeon: Rolm Bookbinder, MD;  Location: [place];  Service: General;  Laterality: Bilateral;  . PECTUS EXCAVATUM REPAIR  2424   45 years old- very large implant  . REMOVAL OF TISSUE EXPANDER AND PLACEMENT OF IMPLANT Bilateral 07/25/2018   Procedure: REMOVAL OF TISSUE EXPANDER AND PLACEMENT OF SILICONE IMPLANT;  Surgeon: Irene Limbo, MD;  Location: [facility];  Service: Plastics;  Laterality: Bilateral;  . SENTINEL NODE BIOPSY Right 02/28/2017    Procedure: RIGHT AXILLARY SENTINEL LYMPH  NODE BIOPSY;  Surgeon: Rolm Bookbinder, MD;  Location: [facility];  Service: General;  Laterality: Right;  . VULVAR LESION REMOVAL    . WISDOM TOOTH EXTRACTION      Family History  Problem Relation Age of Onset  . Skin cancer Mother   . Hyperlipidemia Mother   . Hypertension Mother   . Colon polyps Mother   . Hyperlipidemia Father   . Parkinson's disease Sister 19       early on set  . Miscarriages / Stillbirths Sister   . Diabetes Maternal Aunt   . Colon polyps Maternal Uncle   . Heart failure Maternal Grandmother   . Stroke Maternal Grandmother   . Heart attack Maternal Grandfather   . Depression Maternal Grandfather   . Early death Maternal Grandfather        6 MI  . Stomach cancer Paternal Grandmother   . Prostate cancer Paternal Grandfather   . Alzheimer's disease Paternal Grandfather   . Breast cancer Cousin  mother's paternal first FEMALE cousin  . Colon polyps Sister   . Colon cancer Neg Hx   . Rectal cancer Neg Hx       Social History   Socioeconomic History  . Marital status: Married    Spouse name: [name]  . Number of children: Not on file  . Years of education: Not on file  . Highest education level: Not on file  Occupational History  . Not on file  Tobacco Use  . Smoking status: Never Smoker  . Smokeless tobacco: Never Used  Vaping Use  . Vaping Use: Never used  Substance and Sexual Activity  . Alcohol use: Yes    Alcohol/week: 1.0 - 2.0 standard drink    Types: 1 - 2 Standard drinks or equivalent per week    Comment: occasional wine - twice monthly  . Drug use: No  . Sexual activity: Yes    Birth control/protection: Surgical  Other Topics Concern  . Not on file  Social History Narrative   Married to Dr. Aundra Dubin. 3 children.    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    Masters in public history at [employer]. Undergrad at [employer]:  running, reading, exercise   Social Determinants of Health   Financial Resource Strain: Not on file  Food Insecurity: Not on file  Transportation Needs: Not on file  Physical Activity: Not on file  Stress: Not on file  Social Connections: Not on file    Allergies  Allergen Reactions  . Fentanyl Itching    Severe itching - Patient refuses this medication  . Peach Flavor Anaphylaxis  . Penicillins Hives    AS A CHILD  . Sulfa Antibiotics Nausea And Vomiting  . Sulfasalazine Nausea And Vomiting and Other (See Comments)  . Latex Itching     Current Outpatient Medications:  .  denosumab (PROLIA) 60 MG/ML SOSY injection, Inject 60 mg into the skin every 6 (six) months., Disp: , Rfl:  .  exemestane (AROMASIN) 25 MG tablet, Take 1 tablet (25 mg total) by mouth daily after breakfast., Disp: 90 tablet, Rfl: 3 .  linezolid (ZYVOX) 600 MG tablet, Take 600 mg by mouth 2 (two) times daily., Disp: , Rfl:  .  REPATHA SURECLICK 818 MG/ML SOAJ, INJECT 1 PEN INTO THE SKIN EVERY 14 DAYS., Disp: 2 mL, Rfl: 11 .  Calcium Carbonate (CALCIUM 600 PO), Take 1 capsule by mouth daily.  (Patient not taking: Reported on 01/19/2021), Disp: , Rfl:  .  Multiple Vitamins-Minerals (MULTIVITAMIN WOMEN 50+) TABS, Take 1 tablet by mouth daily.  (Patient not taking: Reported on 01/19/2021), Disp: , Rfl:  .  venlafaxine XR (EFFEXOR-XR) 37.5 MG 24 hr capsule, TAKE 1 CAPSULE BY MOUTH DAILY WITH BREAKFAST (Patient not taking: Reported on 01/19/2021), Disp: 90 capsule, Rfl: 4  Review of Systems  Constitutional: Positive for activity change, appetite change, chills, diaphoresis, fatigue and fever. Negative for unexpected weight change.  HENT: Negative for congestion, rhinorrhea, sinus pressure, sneezing, sore throat and trouble swallowing.   Eyes: Negative for photophobia and visual disturbance.  Respiratory: Negative for cough, chest tightness, shortness of breath, wheezing and stridor.   Cardiovascular: Positive for chest  pain. Negative for palpitations and leg swelling.  Gastrointestinal: Positive for nausea and vomiting. Negative for abdominal distention, abdominal pain, anal bleeding, blood in stool, constipation and diarrhea.  Genitourinary: Negative for difficulty urinating, dysuria, flank pain and  hematuria.  Musculoskeletal: Positive for arthralgias, joint swelling and myalgias. Negative for back pain and gait problem.  Skin: Positive for color change. Negative for pallor, rash and wound.  Neurological: Negative for dizziness, tremors, weakness and light-headedness.  Hematological: Negative for adenopathy. Does not bruise/bleed easily.  Psychiatric/Behavioral: Negative for agitation, behavioral problems, confusion, decreased concentration, dysphoric mood and sleep disturbance.       Objective:   Physical Exam Vitals and nursing note reviewed. Exam conducted with a chaperone present.  Constitutional:      General: She is not in acute distress.    Appearance: She is not diaphoretic.  HENT:     Head: Normocephalic and atraumatic.     Right Ear: External ear normal.     Left Ear: External ear normal.     Nose: Nose normal.     Mouth/Throat:     Pharynx: No oropharyngeal exudate.  Eyes:     General: No scleral icterus.    Extraocular Movements: Extraocular movements intact.     Conjunctiva/sclera: Conjunctivae normal.  Cardiovascular:     Rate and Rhythm: Normal rate and regular rhythm.     Heart sounds: Normal heart sounds. No murmur heard. No friction rub. No gallop.   Pulmonary:     Effort: Pulmonary effort is normal. No respiratory distress.     Breath sounds: No stridor. Rhonchi present. No wheezing or rales.  Chest:    Abdominal:     General: Bowel sounds are normal. There is no distension.     Palpations: Abdomen is soft.     Tenderness: There is no abdominal tenderness.  Musculoskeletal:        General: No tenderness.     Right shoulder: Decreased range of motion.     Cervical  back: Normal range of motion and neck supple.  Lymphadenopathy:     Cervical: No cervical adenopathy.  Skin:    General: Skin is warm and dry.     Coloration: Skin is not pale.     Findings: No erythema or rash.  Neurological:     Mental Status: She is alert and oriented to person, place, and time.     Coordination: Coordination normal.  Psychiatric:        Attention and Perception: Attention normal.        Speech: Speech normal.        Behavior: Behavior is cooperative.        Thought Content: Thought content normal.        Cognition and Memory: Cognition and memory normal.        Judgment: Judgment normal.    Best seen today on January 19, 2021:             Assessment & Plan:   Likely breast implant infection:  I am bothered by how systemically ill she appears.  I have offered to admit her today and she can still exercise that option if she changes her mind later in the day.  I am checking a stat BMP and CBC with differential and checking a set of blood cultures though I doubt we would grow anything on Zyvox is worth getting them.  Also check a sed rate and C-reactive protein.  I will see her tomorrow in clinic and if she is continuing to not do well then I will have her admitted the hospital for intravenous antibiotics and urgent consultation with plastic surgery.  I have also in the interim talk to her plastic surgeon [name]  and she told me that she could take the implants out today or tomorrow.  I think therefore that even if she is doing well tomorrow that direct admission will be the right decision.  Has told me today that she now is ready for the implants to be removed and that she would want both removed if she was having the one of the right removed that is associated with infection.  Certainly if she has surgery to remove the implants I would strongly recommend that cultures be sent not just for routine microbes but also for nontuberculous  mycobacteria with AFB stain and culture as well as fungal cultures  Depression: She has been taken off her antidepressant medications to avoid serotonin syndrome in the context of Zyvox.  Nausea vomiting anorexia and systemic symptoms: Again this are all concerning to me and I think if they fail to improve with antibiotics and symptomatic treatment that she will need to be admitted the hospital for IV antibiotics.  Given her intramuscular dose of Zofran here in the clinic and I have given also prescriptions for dissolvable Zofran that they can pick up at the [facility]  Breast cancer: She has been cancer free and recent MRI showed no evidence of cancer she is also seen Dr. Jana Hakim on Friday  I spent greater than 40 minutes with the patient including greater than 50% of time in face to face counsel of the patient her father and her husband Dr. Aundra Dubin and in coordination of her care with [name].

## 2021-01-19 NOTE — TOC Benefit Eligibility Note (Signed)
Patient Advocate Encounter   Received notification that prior authorization for Sivextro 200 mg is required.   PA submitted on 01/19/2021 Key [id number] Status is pending       Lyndel Safe, CPhT Pharmacy Patient Advocate Specialist [facility] Antimicrobial Stewardship Team Direct Number: [phone]  Fax: [phone]

## 2021-01-20 ENCOUNTER — Observation Stay (HOSPITAL_COMMUNITY): Payer: 59 | Admitting: Anesthesiology

## 2021-01-20 ENCOUNTER — Inpatient Hospital Stay (HOSPITAL_COMMUNITY)
Admission: RE | Admit: 2021-01-20 | Discharge: 2021-01-22 | DRG: 908 | Disposition: A | Discharge status: Home / Self Care | Payer: 59 | Attending: Plastic Surgery | Admitting: Plastic Surgery

## 2021-01-20 ENCOUNTER — Ambulatory Visit (INDEPENDENT_AMBULATORY_CARE_PROVIDER_SITE_OTHER): Payer: 59 | Admitting: Infectious Disease

## 2021-01-20 ENCOUNTER — Other Ambulatory Visit: Payer: Self-pay | Admitting: General Surgery

## 2021-01-20 ENCOUNTER — Encounter (HOSPITAL_COMMUNITY): Payer: Self-pay | Admitting: Plastic Surgery

## 2021-01-20 ENCOUNTER — Encounter (HOSPITAL_COMMUNITY)
Admission: RE | Disposition: A | Discharge status: Home / Self Care | Payer: Self-pay | Source: Home / Self Care | Attending: Plastic Surgery

## 2021-01-20 VITALS — BP 122/81 | HR 97 | Temp 98.6°F

## 2021-01-20 DIAGNOSIS — F419 Anxiety disorder, unspecified: Secondary | ICD-10-CM | POA: Diagnosis not present

## 2021-01-20 DIAGNOSIS — R112 Nausea with vomiting, unspecified: Secondary | ICD-10-CM | POA: Diagnosis not present

## 2021-01-20 DIAGNOSIS — N61 Mastitis without abscess: Secondary | ICD-10-CM

## 2021-01-20 DIAGNOSIS — L03313 Cellulitis of chest wall: Secondary | ICD-10-CM | POA: Diagnosis not present

## 2021-01-20 DIAGNOSIS — Z9013 Acquired absence of bilateral breasts and nipples: Secondary | ICD-10-CM | POA: Diagnosis not present

## 2021-01-20 DIAGNOSIS — Z91018 Allergy to other foods: Secondary | ICD-10-CM

## 2021-01-20 DIAGNOSIS — Z880 Allergy status to penicillin: Secondary | ICD-10-CM

## 2021-01-20 DIAGNOSIS — Z853 Personal history of malignant neoplasm of breast: Secondary | ICD-10-CM

## 2021-01-20 DIAGNOSIS — Z20822 Contact with and (suspected) exposure to covid-19: Secondary | ICD-10-CM | POA: Diagnosis not present

## 2021-01-20 DIAGNOSIS — T8579XD Infection and inflammatory reaction due to other internal prosthetic devices, implants and grafts, subsequent encounter: Secondary | ICD-10-CM | POA: Diagnosis not present

## 2021-01-20 DIAGNOSIS — Z885 Allergy status to narcotic agent status: Secondary | ICD-10-CM

## 2021-01-20 DIAGNOSIS — R52 Pain, unspecified: Secondary | ICD-10-CM

## 2021-01-20 DIAGNOSIS — Z9886 Personal history of breast implant removal: Secondary | ICD-10-CM | POA: Diagnosis not present

## 2021-01-20 DIAGNOSIS — C50411 Malignant neoplasm of upper-outer quadrant of right female breast: Secondary | ICD-10-CM

## 2021-01-20 DIAGNOSIS — Z9882 Breast implant status: Secondary | ICD-10-CM

## 2021-01-20 DIAGNOSIS — Z9221 Personal history of antineoplastic chemotherapy: Secondary | ICD-10-CM

## 2021-01-20 DIAGNOSIS — B9562 Methicillin resistant Staphylococcus aureus infection as the cause of diseases classified elsewhere: Secondary | ICD-10-CM | POA: Diagnosis not present

## 2021-01-20 DIAGNOSIS — T8579XA Infection and inflammatory reaction due to other internal prosthetic devices, implants and grafts, initial encounter: Principal | ICD-10-CM | POA: Diagnosis present

## 2021-01-20 DIAGNOSIS — Z91040 Latex allergy status: Secondary | ICD-10-CM

## 2021-01-20 DIAGNOSIS — B9689 Other specified bacterial agents as the cause of diseases classified elsewhere: Secondary | ICD-10-CM

## 2021-01-20 DIAGNOSIS — E7801 Familial hypercholesterolemia: Secondary | ICD-10-CM | POA: Diagnosis not present

## 2021-01-20 DIAGNOSIS — Z170 Estrogen receptor positive status [ER+]: Secondary | ICD-10-CM

## 2021-01-20 DIAGNOSIS — Q686 Discoid meniscus: Secondary | ICD-10-CM | POA: Diagnosis not present

## 2021-01-20 DIAGNOSIS — Y831 Surgical operation with implant of artificial internal device as the cause of abnormal reaction of the patient, or of later complication, without mention of misadventure at the time of the procedure: Secondary | ICD-10-CM | POA: Diagnosis present

## 2021-01-20 DIAGNOSIS — Z882 Allergy status to sulfonamides status: Secondary | ICD-10-CM | POA: Diagnosis not present

## 2021-01-20 HISTORY — PX: BREAST IMPLANT REMOVAL: SHX5361

## 2021-01-20 LAB — CBC
HCT: 37.2 % (ref 36.0–46.0)
Hemoglobin: 12.4 g/dL (ref 12.0–15.0)
MCH: 32 pg (ref 26.0–34.0)
MCHC: 33.3 g/dL (ref 30.0–36.0)
MCV: 95.9 fL (ref 80.0–100.0)
Platelets: 189 10*3/uL (ref 150–400)
RBC: 3.88 MIL/uL (ref 3.87–5.11)
RDW: 12.7 % (ref 11.5–15.5)
WBC: 13.3 10*3/uL — ABNORMAL HIGH (ref 4.0–10.5)
nRBC: 0 % (ref 0.0–0.2)

## 2021-01-20 LAB — BASIC METABOLIC PANEL
Anion gap: 10 (ref 5–15)
BUN: 9 mg/dL (ref 6–20)
CO2: 24 mmol/L (ref 22–32)
Calcium: 8.5 mg/dL — ABNORMAL LOW (ref 8.9–10.3)
Chloride: 102 mmol/L (ref 98–111)
Creatinine, Ser: 0.63 mg/dL (ref 0.44–1.00)
GFR, Estimated: >60 mL/min (ref >60)
Glucose, Bld: 124 mg/dL — ABNORMAL HIGH (ref 70–99)
Potassium: 3.3 mmol/L — ABNORMAL LOW (ref 3.5–5.1)
Sodium: 136 mmol/L (ref 135–145)

## 2021-01-20 LAB — RESP PANEL BY RT-PCR (FLU A&B, COVID) ARPGX2
Influenza A by PCR: NEGATIVE
Influenza B by PCR: NEGATIVE
SARS Coronavirus 2 by RT PCR: NEGATIVE

## 2021-01-20 LAB — CK: Total CK: 41 U/L (ref 38–234)

## 2021-01-20 SURGERY — REMOVAL, IMPLANT, BREAST
Anesthesia: General | Laterality: Right

## 2021-01-20 MED ORDER — ACETAMINOPHEN 650 MG RE SUPP
650.0000 mg | Freq: Four times a day (QID) | RECTAL | Status: DC | PRN
  Administered 2021-01-20: 650 mg via RECTAL
  Filled 2021-01-20: qty 1

## 2021-01-20 MED ORDER — DEXAMETHASONE SODIUM PHOSPHATE 10 MG/ML IJ SOLN
INTRAMUSCULAR | Status: AC
  Filled 2021-01-20: qty 1

## 2021-01-20 MED ORDER — DIPHENHYDRAMINE HCL 50 MG/ML IJ SOLN
12.5000 mg | Freq: Four times a day (QID) | INTRAMUSCULAR | Status: DC | PRN
  Administered 2021-01-20: 12.5 mg via INTRAVENOUS
  Filled 2021-01-20: qty 1

## 2021-01-20 MED ORDER — MIDAZOLAM HCL 5 MG/5ML IJ SOLN
INTRAMUSCULAR | Status: DC | PRN
  Administered 2021-01-20: 2 mg via INTRAVENOUS

## 2021-01-20 MED ORDER — MIDAZOLAM HCL 2 MG/2ML IJ SOLN: 0.5000 mg | Freq: Once | INTRAMUSCULAR | Status: DC | PRN

## 2021-01-20 MED ORDER — SUGAMMADEX SODIUM 200 MG/2ML IV SOLN
INTRAVENOUS | Status: DC | PRN
  Administered 2021-01-20: 200 mg via INTRAVENOUS

## 2021-01-20 MED ORDER — OXYCODONE HCL 5 MG/5ML PO SOLN: 5.0000 mg | Freq: Once | ORAL | Status: AC | PRN

## 2021-01-20 MED ORDER — HYDROMORPHONE HCL 1 MG/ML IJ SOLN
0.2500 mg | INTRAMUSCULAR | Status: DC | PRN
  Administered 2021-01-20 (×2): 0.25 mg via INTRAVENOUS

## 2021-01-20 MED ORDER — HYDROMORPHONE HCL 1 MG/ML IJ SOLN: 0.5000 mg | INTRAMUSCULAR | Status: DC | PRN

## 2021-01-20 MED ORDER — METHOCARBAMOL 500 MG PO TABS
500.0000 mg | ORAL_TABLET | Freq: Four times a day (QID) | ORAL | Status: DC | PRN
  Administered 2021-01-21 – 2021-01-22 (×5): 500 mg via ORAL
  Filled 2021-01-20 (×6): qty 1

## 2021-01-20 MED ORDER — ACETAMINOPHEN 325 MG PO TABS
650.0000 mg | ORAL_TABLET | Freq: Four times a day (QID) | ORAL | Status: DC | PRN
  Administered 2021-01-21 – 2021-01-22 (×5): 650 mg via ORAL
  Filled 2021-01-20 (×6): qty 2

## 2021-01-20 MED ORDER — SODIUM CHLORIDE 0.9 % IV SOLN
INTRAVENOUS | Status: DC | PRN
  Administered 2021-01-20: 500 mL

## 2021-01-20 MED ORDER — ONDANSETRON HCL 4 MG/2ML IJ SOLN
INTRAMUSCULAR | Status: DC | PRN
  Administered 2021-01-20: 4 mg via INTRAVENOUS

## 2021-01-20 MED ORDER — SODIUM CHLORIDE 0.9 % IV SOLN
410.0000 mg | Freq: Every day | INTRAVENOUS | Status: DC
  Administered 2021-01-20 – 2021-01-21 (×2): 410 mg via INTRAVENOUS
  Filled 2021-01-20 (×3): qty 8.2

## 2021-01-20 MED ORDER — SODIUM CHLORIDE 0.9 % IV SOLN: INTRAVENOUS | Status: DC

## 2021-01-20 MED ORDER — VANCOMYCIN HCL IN DEXTROSE 1-5 GM/200ML-% IV SOLN
1000.0000 mg | INTRAVENOUS | Status: AC
  Administered 2021-01-20: 1000 mg via INTRAVENOUS

## 2021-01-20 MED ORDER — DIPHENHYDRAMINE HCL 50 MG/ML IJ SOLN
INTRAMUSCULAR | Status: AC
  Filled 2021-01-20: qty 1

## 2021-01-20 MED ORDER — OXYCODONE HCL 5 MG PO TABS
5.0000 mg | ORAL_TABLET | Freq: Once | ORAL | Status: AC | PRN
  Administered 2021-01-20: 5 mg via ORAL

## 2021-01-20 MED ORDER — SODIUM CHLORIDE 0.9 % IV SOLN
Freq: Once | INTRAVENOUS | Status: DC
  Filled 2021-01-20: qty 10

## 2021-01-20 MED ORDER — DIPHENHYDRAMINE HCL 50 MG/ML IJ SOLN
6.2500 mg | Freq: Once | INTRAMUSCULAR | Status: AC
  Administered 2021-01-20: 6.5 mg via INTRAVENOUS

## 2021-01-20 MED ORDER — HYDROMORPHONE HCL 1 MG/ML IJ SOLN
INTRAMUSCULAR | Status: AC
  Filled 2021-01-20: qty 1

## 2021-01-20 MED ORDER — ONDANSETRON 4 MG PO TBDP: 4.0000 mg | ORAL_TABLET | Freq: Four times a day (QID) | ORAL | Status: DC | PRN

## 2021-01-20 MED ORDER — 0.9 % SODIUM CHLORIDE (POUR BTL) OPTIME
TOPICAL | Status: DC | PRN
  Administered 2021-01-20: 1000 mL

## 2021-01-20 MED ORDER — ROCURONIUM BROMIDE 10 MG/ML (PF) SYRINGE
PREFILLED_SYRINGE | INTRAVENOUS | Status: DC | PRN
  Administered 2021-01-20: 30 mg via INTRAVENOUS

## 2021-01-20 MED ORDER — LIDOCAINE 2% (20 MG/ML) 5 ML SYRINGE
INTRAMUSCULAR | Status: DC | PRN
  Administered 2021-01-20: 20 mg via INTRAVENOUS

## 2021-01-20 MED ORDER — DEXAMETHASONE SODIUM PHOSPHATE 10 MG/ML IJ SOLN
INTRAMUSCULAR | Status: DC | PRN
  Administered 2021-01-20: 8 mg via INTRAVENOUS

## 2021-01-20 MED ORDER — VANCOMYCIN HCL 1000 MG IV SOLR
INTRAVENOUS | Status: DC
  Filled 2021-01-20: qty 1000

## 2021-01-20 MED ORDER — PHENYLEPHRINE 40 MCG/ML (10ML) SYRINGE FOR IV PUSH (FOR BLOOD PRESSURE SUPPORT)
PREFILLED_SYRINGE | INTRAVENOUS | Status: AC
  Filled 2021-01-20: qty 20

## 2021-01-20 MED ORDER — VANCOMYCIN HCL IN DEXTROSE 1-5 GM/200ML-% IV SOLN
INTRAVENOUS | Status: AC
  Filled 2021-01-20: qty 200

## 2021-01-20 MED ORDER — LACTATED RINGERS IV SOLN: INTRAVENOUS | Status: DC | PRN

## 2021-01-20 MED ORDER — SUFENTANIL CITRATE 50 MCG/ML IV SOLN
INTRAVENOUS | Status: DC | PRN
  Administered 2021-01-20: 5 ug via INTRAVENOUS
  Administered 2021-01-20: 15 ug via INTRAVENOUS

## 2021-01-20 MED ORDER — SUCCINYLCHOLINE CHLORIDE 200 MG/10ML IV SOSY
PREFILLED_SYRINGE | INTRAVENOUS | Status: AC
  Filled 2021-01-20: qty 10

## 2021-01-20 MED ORDER — ONDANSETRON HCL 4 MG/2ML IJ SOLN: 4.0000 mg | Freq: Four times a day (QID) | INTRAMUSCULAR | Status: DC | PRN

## 2021-01-20 MED ORDER — SUCCINYLCHOLINE CHLORIDE 20 MG/ML IJ SOLN
INTRAMUSCULAR | Status: DC | PRN
  Administered 2021-01-20: 120 mg via INTRAVENOUS

## 2021-01-20 MED ORDER — VENLAFAXINE HCL ER 37.5 MG PO CP24
37.5000 mg | ORAL_CAPSULE | Freq: Every day | ORAL | Status: DC
Start: 2021-01-21 — End: 2021-01-22
  Administered 2021-01-21 – 2021-01-22 (×2): 37.5 mg via ORAL
  Filled 2021-01-20 (×3): qty 1

## 2021-01-20 MED ORDER — SUFENTANIL CITRATE 50 MCG/ML IV SOLN
INTRAVENOUS | Status: AC
  Filled 2021-01-20: qty 1

## 2021-01-20 MED ORDER — ROCURONIUM BROMIDE 10 MG/ML (PF) SYRINGE
PREFILLED_SYRINGE | INTRAVENOUS | Status: AC
  Filled 2021-01-20: qty 10

## 2021-01-20 MED ORDER — LIDOCAINE 2% (20 MG/ML) 5 ML SYRINGE
INTRAMUSCULAR | Status: AC
  Filled 2021-01-20: qty 15

## 2021-01-20 MED ORDER — CHLORHEXIDINE GLUCONATE 0.12 % MT SOLN
OROMUCOSAL | Status: AC
  Filled 2021-01-20: qty 15

## 2021-01-20 MED ORDER — OXYCODONE HCL 5 MG PO TABS
ORAL_TABLET | ORAL | Status: AC
  Filled 2021-01-20: qty 1

## 2021-01-20 MED ORDER — PROMETHAZINE HCL 25 MG/ML IJ SOLN: 6.2500 mg | INTRAMUSCULAR | Status: DC | PRN

## 2021-01-20 MED ORDER — MIDAZOLAM HCL 2 MG/2ML IJ SOLN
INTRAMUSCULAR | Status: AC
  Filled 2021-01-20: qty 2

## 2021-01-20 MED ORDER — PROPOFOL 10 MG/ML IV BOLUS
INTRAVENOUS | Status: DC | PRN
  Administered 2021-01-20: 120 mg via INTRAVENOUS

## 2021-01-20 MED ORDER — OXYCODONE HCL 5 MG PO TABS
5.0000 mg | ORAL_TABLET | ORAL | Status: DC | PRN
  Administered 2021-01-20: 5 mg via ORAL
  Filled 2021-01-20: qty 1

## 2021-01-20 MED ORDER — DIPHENHYDRAMINE HCL 50 MG/ML IJ SOLN
INTRAMUSCULAR | Status: DC | PRN
  Administered 2021-01-20: 25 mg via INTRAVENOUS

## 2021-01-20 MED ORDER — MEPERIDINE HCL 25 MG/ML IJ SOLN: 6.2500 mg | INTRAMUSCULAR | Status: DC | PRN

## 2021-01-20 SURGICAL SUPPLY — 47 items
BAG DECANTER FOR FLEXI CONT (MISCELLANEOUS) IMPLANT
BINDER BREAST XLRG (GAUZE/BANDAGES/DRESSINGS) ×2 IMPLANT
BLADE CLIPPER SURG (BLADE) IMPLANT
BNDG GAUZE ELAST 4 BULKY (GAUZE/BANDAGES/DRESSINGS) IMPLANT
CANISTER SUCT 3000ML PPV (MISCELLANEOUS) ×2 IMPLANT
CHLORAPREP W/TINT 26 (MISCELLANEOUS) IMPLANT
CNTNR URN SCR LID CUP LEK RST (MISCELLANEOUS) IMPLANT
CONT SPEC 4OZ STRL OR WHT (MISCELLANEOUS)
COVER SURGICAL LIGHT HANDLE (MISCELLANEOUS) ×2 IMPLANT
COVER WAND RF STERILE (DRAPES) ×2 IMPLANT
DRAIN CHANNEL 19F RND (DRAIN) ×2 IMPLANT
DRAPE HALF SHEET 40X57 (DRAPES) IMPLANT
DRAPE IMP U-DRAPE 54X76 (DRAPES) ×2 IMPLANT
DRAPE INCISE IOBAN 66X45 STRL (DRAPES) IMPLANT
DRAPE LAPAROTOMY 100X72 PEDS (DRAPES) ×4 IMPLANT
DRSG ADAPTIC 3X8 NADH LF (GAUZE/BANDAGES/DRESSINGS) IMPLANT
DRSG PAD ABDOMINAL 8X10 ST (GAUZE/BANDAGES/DRESSINGS) ×4 IMPLANT
DRSG VAC ATS LRG SENSATRAC (GAUZE/BANDAGES/DRESSINGS) IMPLANT
DRSG VAC ATS MED SENSATRAC (GAUZE/BANDAGES/DRESSINGS) IMPLANT
DRSG VAC ATS SM SENSATRAC (GAUZE/BANDAGES/DRESSINGS) IMPLANT
ELECT CAUTERY BLADE 6.4 (BLADE) ×2 IMPLANT
ELECT COATED BLADE 2.86 ST (ELECTRODE) ×2 IMPLANT
ELECT REM PT RETURN 9FT ADLT (ELECTROSURGICAL) ×2
ELECTRODE REM PT RTRN 9FT ADLT (ELECTROSURGICAL) ×1 IMPLANT
GAUZE SPONGE 4X4 12PLY STRL (GAUZE/BANDAGES/DRESSINGS) ×2 IMPLANT
GLOVE BIO SURGEON STRL SZ 6 (GLOVE) ×2 IMPLANT
GLOVE SURG SS PI 6.0 STRL IVOR (GLOVE) ×2 IMPLANT
GOWN STRL REUS W/ TWL LRG LVL3 (GOWN DISPOSABLE) ×2 IMPLANT
GOWN STRL REUS W/TWL LRG LVL3 (GOWN DISPOSABLE) ×4
HANDPIECE INTERPULSE COAX TIP (DISPOSABLE)
KIT BASIN OR (CUSTOM PROCEDURE TRAY) ×2 IMPLANT
KIT TURNOVER KIT B (KITS) ×2 IMPLANT
NS IRRIG 1000ML POUR BTL (IV SOLUTION) ×2 IMPLANT
PACK GENERAL/GYN (CUSTOM PROCEDURE TRAY) ×2 IMPLANT
PACK UNIVERSAL I (CUSTOM PROCEDURE TRAY) ×2 IMPLANT
PAD ARMBOARD 7.5X6 YLW CONV (MISCELLANEOUS) ×4 IMPLANT
SET HNDPC FAN SPRY TIP SCT (DISPOSABLE) IMPLANT
STRIP CLOSURE SKIN 1/2X4 (GAUZE/BANDAGES/DRESSINGS) ×2 IMPLANT
SURGILUBE 2OZ TUBE FLIPTOP (MISCELLANEOUS) IMPLANT
SUT ETHILON 2 0 FS 18 (SUTURE) ×2 IMPLANT
SUT MNCRL AB 3-0 PS2 27 (SUTURE) ×2 IMPLANT
SUT MNCRL AB 4-0 PS2 18 (SUTURE) ×2 IMPLANT
SWAB COLLECTION DEVICE MRSA (MISCELLANEOUS) IMPLANT
SWAB CULTURE ESWAB REG 1ML (MISCELLANEOUS) IMPLANT
TOWEL GREEN STERILE (TOWEL DISPOSABLE) ×2 IMPLANT
TOWEL GREEN STERILE FF (TOWEL DISPOSABLE) ×2 IMPLANT
UNDERPAD 30X36 HEAVY ABSORB (UNDERPADS AND DIAPERS) ×2 IMPLANT

## 2021-01-20 NOTE — Progress Notes (Signed)
Subjective:  Chief Complaints: "I feel terrible"   Patient ID: Katelyn Shelton, female    DOB: [birth year], 45 y.o.   MRN: [id number]  HPI  Katelyn Shelton is a 45 year old Caucasian female who had bilateral mastectomies for breast cancer followed by implants that were placed by [name] here in [place].  A little bit over a year ago she was seen in my office by my partner Dr. Johnnye Sima when the patient had acute onset then of erythema around the breast.  That time she had been started on ciprofloxacin by plastic surgery.  On 10 February.  She then seen by my partner Dr. Johnnye Sima on the 11th and given 24 hours of Zyvox followed by single dose of oritavancin on 12 February.  She still has some residual erythema on 17 February was given a repeat dose of oritavancin.  To be doing well but then began complaining again of some area around the breast that was erythematous and doxycycline was called in which I believe she actually took for more than the 13 days but rather has been something is to be as she was on for multiple months.  She saw Dr. Donne Hazel with general surgery I told her that if she came off the doxycycline and had recurrence of cellulitis that she would need to have her implant removed.  In the interim she is also seen Dr. Abelino Derrick at [facility].  Did not have recurrence of infection immediately after coming off the doxycycline but that was only a few months ago that she stopped it.  She said that she has had ongoing discomfort and pain in that breast even when not being treated for active infection.  She was coming back from traveling out of the city by air and had to overhead to get some baggage and had some pain in her arm and armpit.   The interim she then developed a rash below the breast that had what her father described as being a petechial appearance. (he is also a physician).  In the interim the erythema worsened but more troublesome she began to have more  systemic symptoms of malaise weakness anorexia nausea and vomiting.  Her husband Dr. [name] reached out to my partner Dr. Johnnye Sima and started this although she has had only 2 doses so far and continues to have trouble with nausea and vomiting.  I saw Katelyn Shelton yesterday and offered direct admission then vs today. We were able to help her nausea with zofram IM and dissolvable zofran that she has been taking.  I have corresponded with [name] who told me she could take out implant yesterday vs today.  I also spoke with Dr. Donne Hazel who told me he could facilitate admission today rather than have her go to hospitalist service.   Overnight the zofran controlled her nausea but she had one fever over 101.8.   She continues to hurt "all over" and looks fairly miserable. She is resting in the exam room lying down wishing to rest.        Past Medical History:  Diagnosis Date  . Allergy   . Anxiety    venflafaxine XR 75 mg  . Breast cancer ([place]) 01/2017   right; genetic testing negative in 2018 with [employer] panel  . Heart murmur    as child- went away  . History of MRSA infection    elbow  . Hyperlipidemia   . Infection of breast implant ([place]) 01/19/2021  . Joint pain  due to medicine induced joint pain  . Migraine    otc med prn  . Miscarriage    x1  . Nausea and vomiting 01/19/2021  . Neuromuscular disorder ([place])    neuropathy bilater feet - ? 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 Osteopenia   . UTI (urinary tract infection)     Past Surgical History:  Procedure Laterality Date  . AREOLA/NIPPLE RECONSTRUCTION WITH GRAFT Right 12/01/2018   Procedure: RIGHT NIPPLE AREOLA COMPLEX RECONSTRUCTION WITH FULL THICKNESS SKIN GRAFT FROM RIGHT GROIN;  Surgeon: Irene Limbo, MD;  Location: [place];  Service: Plastics;  Laterality: Right;  . BREAST RECONSTRUCTION Left 12/01/2018   Procedure: LEFT BREAST REVISION RECONSTRUCTION WITH SKIN EXCISION;  Surgeon: Irene Limbo, MD;  Location:  [place];  Service: Plastics;  Laterality: Left;  . BREAST RECONSTRUCTION Left 12/01/2018   Procedure: COMPOSITE GRAFT FROM LEFT NIPPLE (NIPPLE SHARING);  Surgeon: Irene Limbo, MD;  Location: [place];  Service: Plastics;  Laterality: Left;  . BREAST RECONSTRUCTION WITH PLACEMENT OF TISSUE EXPANDER AND FLEX HD (ACELLULAR HYDRATED DERMIS) Bilateral 02/28/2017   Procedure: BILATERAL BREAST RECONSTRUCTION WITH PLACEMENT OF TISSUE EXPANDER AND ALLODERM;  Surgeon: Irene Limbo, MD;  Location: [place];  Service: Plastics;  Laterality: Bilateral;  . CESAREAN SECTION  2008,2010,2013   x 3  . CESAREAN SECTION  02/04/2012   Procedure: CESAREAN SECTION;  Surgeon: Cyril Mourning, MD;  Location: [facility];  Service: Gynecology;  Laterality: N/A;  Repeat Cesarean Section Delivery  Boy  @  [phone], Apgars 9/10  . COLONOSCOPY     polyp  . DILATION AND CURETTAGE OF UTERUS    . EXCISION OF BREAST LESION Right 03/15/2017   Procedure: RIGHT NIPPLE AND AREOLA EXCISION;  Surgeon: Rolm Bookbinder, MD;  Location: [place];  Service: General;  Laterality: Right;  . LAPAROSCOPIC VAGINAL HYSTERECTOMY WITH SALPINGO OOPHORECTOMY Bilateral 06/29/2018   Procedure: LAPAROSCOPIC ASSISTED VAGINAL HYSTERECTOMY WITH SALPINGO OOPHORECTOMY;  Surgeon: Louretta Shorten, MD;  Location: [facility];  Service: Gynecology;  Laterality: Bilateral;  Request case for [facility] CRNA  . LIPOSUCTION WITH LIPOFILLING N/A 07/25/2018   Procedure: LIPOSUCTION WITH LIPOFILLING FROM ABDOMEN TO CHEST;  Surgeon: Irene Limbo, MD;  Location: [place];  Service: Plastics;  Laterality: N/A;  . LIPOSUCTION WITH LIPOFILLING Bilateral 12/01/2018   Procedure: LIPOFILLING TO BILATERAL CHEST REVISION;  Surgeon: Irene Limbo, MD;  Location: [place];  Service: Plastics;  Laterality: Bilateral;  . NIPPLE SPARING MASTECTOMY Bilateral 02/28/2017   Procedure: RIGHT  NIPPLE SPARING MASTECTOMY; LEFT PROPHYLACTIC NIPPLE SPARING MASTECTOMY;  Surgeon: Rolm Bookbinder, MD;  Location: [place];  Service: General;  Laterality: Bilateral;  . PECTUS EXCAVATUM REPAIR  1344   45 years old- very large implant  . REMOVAL OF TISSUE EXPANDER AND PLACEMENT OF IMPLANT Bilateral 07/25/2018   Procedure: REMOVAL OF TISSUE EXPANDER AND PLACEMENT OF SILICONE IMPLANT;  Surgeon: Irene Limbo, MD;  Location: [place];  Service: Plastics;  Laterality: Bilateral;  . SENTINEL NODE BIOPSY Right 02/28/2017   Procedure: RIGHT AXILLARY SENTINEL LYMPH  NODE BIOPSY;  Surgeon: Rolm Bookbinder, MD;  Location: [place];  Service: General;  Laterality: Right;  . VULVAR LESION REMOVAL    . WISDOM TOOTH EXTRACTION      Family History  Problem Relation Age of Onset  . Skin cancer Mother   . Hyperlipidemia Mother   . Hypertension Mother   . Colon polyps Mother   .  Hyperlipidemia Father   . Parkinson's disease Sister 44       early on set  . Miscarriages / Stillbirths Sister   . Diabetes Maternal Aunt   . Colon polyps Maternal Uncle   . Heart failure Maternal Grandmother   . Stroke Maternal Grandmother   . Heart attack Maternal Grandfather   . Depression Maternal Grandfather   . Early death Maternal Grandfather        93 MI  . Stomach cancer Paternal Grandmother   . Prostate cancer Paternal Grandfather   . Alzheimer's disease Paternal Grandfather   . Breast cancer Cousin        mother's paternal first FEMALE cousin  . Colon polyps Sister   . Colon cancer Neg Hx   . Rectal cancer Neg Hx       Social History   Socioeconomic History  . Marital status: Married    Spouse name: [name]  . Number of children: Not on file  . Years of education: Not on file  . Highest education level: Not on file  Occupational History  . Not on file  Tobacco Use  . Smoking status: Never Smoker  . Smokeless tobacco: Never Used  Vaping Use   . Vaping Use: Never used  Substance and Sexual Activity  . Alcohol use: Yes    Alcohol/week: 1.0 - 2.0 standard drink    Types: 1 - 2 Standard drinks or equivalent per week    Comment: occasional wine - twice monthly  . Drug use: No  . Sexual activity: Yes    Birth control/protection: Surgical  Other Topics Concern  . Not on file  Social History Narrative   Married to Dr. Aundra Dubin. 3 children.    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    Masters in public history at [employer]. Undergrad at [employer]: running, reading, exercise   Social Determinants of Health   Financial Resource Strain: Not on file  Food Insecurity: Not on file  Transportation Needs: Not on file  Physical Activity: Not on file  Stress: Not on file  Social Connections: Not on file    Allergies  Allergen Reactions  . Fentanyl Itching    Severe itching - Patient refuses this medication  . Peach Flavor Anaphylaxis  . Penicillins Hives    AS A CHILD  . Sulfa Antibiotics Nausea And Vomiting  . Sulfasalazine Nausea And Vomiting and Other (See Comments)  . Latex Itching     Current Outpatient Medications:  .  Calcium Carbonate (CALCIUM 600 PO), Take 1 capsule by mouth daily., Disp: , Rfl:  .  denosumab (PROLIA) 60 MG/ML SOSY injection, Inject 60 mg into the skin every 6 (six) months., Disp: , Rfl:  .  exemestane (AROMASIN) 25 MG tablet, Take 1 tablet (25 mg total) by mouth daily after breakfast., Disp: 90 tablet, Rfl: 3 .  linezolid (ZYVOX) 600 MG tablet, Take 600 mg by mouth 2 (two) times daily., Disp: , Rfl:  .  Multiple Vitamins-Minerals (MULTIVITAMIN WOMEN 50+) TABS, Take 1 tablet by mouth daily., Disp: , Rfl:  .  ondansetron (ZOFRAN-ODT) 4 MG disintegrating tablet, Take 1 tablet (4 mg total) by mouth every 8 (eight) hours as needed for nausea or vomiting., Disp: 60 tablet, Rfl: 0 .  REPATHA SURECLICK 740 MG/ML SOAJ, INJECT 1 PEN INTO THE SKIN EVERY 14 DAYS.  (Patient taking differently: No sig reported), Disp:  2 mL, Rfl: 11 .  venlafaxine XR (EFFEXOR-XR) 37.5 MG 24 hr capsule, TAKE 1 CAPSULE BY MOUTH DAILY WITH BREAKFAST, Disp: 90 capsule, Rfl: 4  Review of Systems  Constitutional: Positive for activity change, appetite change, chills, diaphoresis, fatigue and fever. Negative for unexpected weight change.  HENT: Negative for congestion, rhinorrhea, sinus pressure, sneezing, sore throat and trouble swallowing.   Eyes: Negative for photophobia and visual disturbance.  Respiratory: Negative for cough, chest tightness, shortness of breath, wheezing and stridor.   Cardiovascular: Positive for chest pain. Negative for palpitations and leg swelling.  Gastrointestinal: Positive for nausea. Negative for abdominal distention, abdominal pain, anal bleeding, blood in stool, constipation and diarrhea.  Genitourinary: Negative for difficulty urinating, dysuria, flank pain and hematuria.  Musculoskeletal: Positive for arthralgias, joint swelling and myalgias. Negative for back pain and gait problem.  Skin: Positive for color change. Negative for pallor, rash and wound.  Neurological: Negative for dizziness, tremors, weakness and light-headedness.  Hematological: Negative for adenopathy. Does not bruise/bleed easily.  Psychiatric/Behavioral: Negative for agitation, behavioral problems, confusion, decreased concentration, dysphoric mood and sleep disturbance.       Objective:   Physical Exam Vitals and nursing note reviewed. Exam conducted with a chaperone present.  Constitutional:      General: She is not in acute distress.    Appearance: She is not diaphoretic.  HENT:     Head: Normocephalic and atraumatic.     Right Ear: External ear normal.     Left Ear: External ear normal.     Nose: Nose normal.     Mouth/Throat:     Pharynx: No oropharyngeal exudate.  Eyes:     General: No scleral icterus.    Extraocular Movements: Extraocular movements intact.      Conjunctiva/sclera: Conjunctivae normal.  Cardiovascular:     Rate and Rhythm: Normal rate and regular rhythm.     Heart sounds: Normal heart sounds. No murmur heard. No friction rub. No gallop.   Pulmonary:     Effort: Pulmonary effort is normal. No respiratory distress.     Breath sounds: No stridor. No wheezing or rales.  Chest:    Abdominal:     General: Bowel sounds are normal. There is no distension.     Palpations: Abdomen is soft.     Tenderness: There is no abdominal tenderness.  Musculoskeletal:        General: No tenderness.     Right shoulder: Decreased range of motion.     Cervical back: Normal range of motion and neck supple.  Lymphadenopathy:     Cervical: No cervical adenopathy.  Skin:    General: Skin is warm and dry.     Coloration: Skin is not pale.     Findings: No erythema or rash.  Neurological:     General: No focal deficit present.     Mental Status: She is alert and oriented to person, place, and time.     Coordination: Coordination normal.  Psychiatric:        Attention and Perception: Attention normal.        Mood and Affect: Mood is anxious.        Speech: Speech normal.        Behavior: Behavior is cooperative.        Thought Content: Thought content normal.        Cognition and Memory: Cognition and memory normal.        Judgment: Judgment normal.    Breast on January 19, 2021:       Today March 29th, 2022:           Assessment & Plan:   Breast implant infection:  We are admitting her to Dr. Cristal Generous service with [facility]  I would recommend starting IV daptomycin for cubicin.   I also sent blood cultures from clinic yesterday (on zyvox)  Given the abrupt onset of her symptoms this seems most likely a gram + infection with streptococci or staphylococci (if the doxycycline was making a difference the latter)  She needs IV fluids, antiemetics and pain control  When surgery is done to remove right breast implant please  send dry operative cultures for bacterial aerobic anaerobic cultures AFB stain and cultures and fungal cultures.   Depression: She has been taken off her antidepressant medications to avoid serotonin syndrome in the context of Zyvox.  Nausea vomiting anorexia and systemic symptoms: Zofran has helped with the nausea but she continues to have diffuse body aches  Breast cancer: She has been cancer free and recent MRI showed no evidence of cancer she is also seen Dr. Jana Hakim on Friday  I spent greater than 40 minutes with the patient including greater than 50% of time in face to face counsel of the patient and in coordination with General Surgery obtaining a bed for her on 6N. and in coordination of her care.  I will let inpatient consult service know she is coming in so they can see her as well.

## 2021-01-20 NOTE — Anesthesia Procedure Notes (Signed)
Procedure Name: Intubation Date/Time: 01/20/2021 4:08 PM Performed by: Mariea Clonts, CRNA Pre-anesthesia Checklist: Patient identified, Emergency Drugs available, Suction available and Patient being monitored Patient Re-evaluated:Patient Re-evaluated prior to induction Oxygen Delivery Method: Circle System Utilized Preoxygenation: Pre-oxygenation with 100% oxygen Induction Type: IV induction, Cricoid Pressure applied and Rapid sequence Laryngoscope Size: Miller and 2 Grade View: Grade I Tube type: Oral Tube size: 7.0 mm Number of attempts: 1 Airway Equipment and Method: Stylet and Oral airway Placement Confirmation: ETT inserted through vocal cords under direct vision,  positive ETCO2 and breath sounds checked- equal and bilateral Tube secured with: Tape Dental Injury: Teeth and Oropharynx as per pre-operative assessment

## 2021-01-20 NOTE — H&P (Signed)
Patient ID: Caryssa Elzey is a 45 y.o. female.   Nearly 4 years post op mastectomies with implant based reconstruction. Developed recurrent cellulitis right chest with reported fevers at home and overall feeling poorly. Patient admitted this am prior to planned removal right chest implant.  Presented with palpable mass right breast. MMG with mass containing heterogeneous calcifications identified within the UOQ. Additional 3 mm group of slightly heterogeneous calcifications within the retroareolar left breast noted. US showed 4 x 1.6 x 3.5 cm mass at the 10 o'clock position 4 cm from the nipple. Biopsy demonstrated IDC with DCIS, ER/PR+, Her2-. Final pathology 1/1 SLN, right IDC spanning at least 4 cm with both retroareolar and nipple specimen positive for IDC. +LVI, perineural invasion. She underwent resection right NAC with clear margins, no further carcinoma.  Oncotype 11. Genetics negative. Mammaprint low risk. Patient completed adjuvant chemotherapy and completed adjuvant radiation 12.7.18. On Aromasin.   PMH significant for silicone implant as part of pectus excavatum repair performed by Dr. Jaclynn Guarneri 1996.  Prior 32B, happy with this. Left mastectomy 289 g Right 239 g  Review of Systems   Objective:  Physical Exam  CV: normal heart sounds Pulm: clear to auscultation Chest: left soft benign Right reconstruction with erythema  Assessment:   R breast ca UOQ Pectus excavatum, s/p silicone implant placement S/p bilateral NSM, right SLN, TE/ADM (Alloderm) reconstruction S/p excision right NAC Adjuvant chemotherapy, radiation S/p silicone implant exchange, lipofilling, excision nevus abdomen S/p lipoflling bilateral chest, right NAC recon with FTSG, nipple sharing, revision left with mastectomy flap excision at [facility] Cellulitis right chest recurrent  Plan:   Recurrent right chest cellulitis in setting RT with systemic systems. Plan removal right chest implant. Discussed  resection reconstructed NAC- counseled that if she pursues further reconstruction in future this may not be in appropriate position. Reviewed soft tissue will contract following removal device and soft tissue may not lie flat on chest. Patient asked to keep reconstruction NAC in place for now. Will use IMF scar in this case. Drain placement planned and partial capsulectomy. Father asked about future reconstruction- as we have discussed in past and as she has discussed at [facility], latissimus flap with tissue expander is option for future reconstruction. Defer antibiotics to ID. OR today. COVID+ Jan 2022, but no results of this available.  Natrelle Inspira Full Projection 415 ml implants placed bilateral. REF [id number]  #1 55 ml fat infiltrated over right chest, 40 ml over left chest. #2 35 ml fat infiltrated over left superior pole reconstruction, 20 ml fat infiltrated right axilla and upper outer right breast reconstruction

## 2021-01-20 NOTE — Op Note (Signed)
Operative Note   DATE OF OPERATION: 3.29.22  LOCATION: [facility]  SURGICAL DIVISION: Plastic Surgery  PREOPERATIVE DIAGNOSES:  1. Cellulitis right chest 2. History breast cancer 3. Acquired absence breasts 4. History therapeutic radiation  POSTOPERATIVE DIAGNOSES:  same  PROCEDURE:  Removal right breast implant  SURGEON: Irene Limbo MD MBA  ASSISTANT: none  ANESTHESIA:  General.   EBL: 30 ml  COMPLICATIONS: None immediate.   INDICATIONS FOR PROCEDURE:  The patient, Katelyn Shelton, is a 45 y.o. female born on 1976/04/25, is here for removal right breast implant in setting cellulitis chest and fevers. Patient has undergone prior pre pectoral implant based reconstruction.   FINDINGS: Removed intact smooth silicone implant. Cloudy thick fluid located within implant pocket.   DESCRIPTION OF PROCEDURE:  The patient's operative site was marked with the patient in the preoperative area. The patient was taken to the operating room. SCDs were placed and IV antibiotics were given. The patient's operative site was prepped and draped in a sterile fashion. A time out was performed and all information was confirmed to be correct. Incision made in right inframammary fold scar and carried through superficial fascia and acellular dermis. Implant removed. Curettage performed over cavity and partial excision capsule and acellular dermis completed. Implant pocket irrigated with saline followed by saline solution containing Ancef, gentamicin, and Betadine. 66 Fr JP placed percutaneously and secured with 2-0 nylon. Closure incision completed with 3-0 monocryl in superficial fascia, 4-0 monocryl subcuticular skin closure. Steri strips applied followed by dry dressing and breast binder.  The patient was allowed to wake from anesthesia, extubated and taken to the recovery room in satisfactory condition.   SPECIMENS: culture  DRAINS: 19 Fr JP in right chest

## 2021-01-20 NOTE — Anesthesia Preprocedure Evaluation (Addendum)
Anesthesia Evaluation  Patient identified by MRN, date of birth, ID band Patient awake    Reviewed: Allergy & Precautions, NPO status , Patient's Chart, lab work & pertinent test results  History of Anesthesia Complications Negative for: history of anesthetic complications  Airway Mallampati: II  TM Distance: >3 FB Neck ROM: Full    Dental  (+) Dental Advisory Given   Pulmonary  01/20/2021 SARS coronavirus NEG   breath sounds clear to auscultation       Cardiovascular + Valvular Problems/Murmurs  Rhythm:Regular Rate:Normal  HLD   Neuro/Psych  Headaches, Anxiety    GI/Hepatic negative GI ROS, Neg liver ROS,   Endo/Other  negative endocrine ROS  Renal/GU negative Renal ROS  negative genitourinary   Musculoskeletal negative musculoskeletal ROS (+)   Abdominal   Peds  Hematology negative hematology ROS (+)   Anesthesia Other Findings Bilateral breast reconstruction, right breast cancer: chemo, XRT  Reproductive/Obstetrics                                                             Anesthesia Evaluation  Patient identified by MRN, date of birth, ID band Patient awake    Reviewed: Allergy & Precautions, NPO status , Patient's Chart, lab work & pertinent test results  Airway Mallampati: I       Dental no notable dental hx. (+) Teeth Intact   Pulmonary neg pulmonary ROS,    Pulmonary exam normal breath sounds clear to auscultation       Cardiovascular negative cardio ROS Normal cardiovascular exam Rhythm:Regular Rate:Normal     Neuro/Psych    GI/Hepatic negative GI ROS, Neg liver ROS,   Endo/Other  negative endocrine ROS  Renal/GU negative Renal ROS  negative genitourinary   Musculoskeletal negative musculoskeletal ROS (+)   Abdominal Normal abdominal exam  (+)   Peds  Hematology negative hematology ROS (+)   Anesthesia Other Findings    Reproductive/Obstetrics                            Anesthesia Physical Anesthesia Plan  ASA: II  Anesthesia Plan: General   Post-op Pain Management:    Induction: Intravenous  PONV Risk Score and Plan: 4 or greater and Ondansetron, Dexamethasone, Midazolam and Scopolamine patch - Pre-op  Airway Management Planned: Oral ETT  Additional Equipment:   Intra-op Plan:   Post-operative Plan: Extubation in OR  Informed Consent: I have reviewed the patients History and Physical, chart, labs and discussed the procedure including the risks, benefits and alternatives for the proposed anesthesia with the patient or authorized representative who has indicated his/her understanding and acceptance.   Dental advisory given  Plan Discussed with: CRNA and Surgeon  Anesthesia Plan Comments:        Anesthesia Quick Evaluation  Anesthesia Physical  Anesthesia Plan  ASA: II  Anesthesia Plan: General   Post-op Pain Management:    Induction: Intravenous  PONV Risk Score and Plan: 4 or greater and Ondansetron, Dexamethasone, Midazolam, Treatment may vary due to age or medical condition and Diphenhydramine  Airway Management Planned: Oral ETT  Additional Equipment: None  Intra-op Plan:   Post-operative Plan: Extubation in OR  Informed Consent: I have reviewed the patients History and Physical, chart, labs and discussed the procedure  including the risks, benefits and alternatives for the proposed anesthesia with the patient or authorized representative who has indicated his/her understanding and acceptance.     Dental advisory given  Plan Discussed with: CRNA and Surgeon  Anesthesia Plan Comments:        Anesthesia Quick Evaluation

## 2021-01-20 NOTE — Anesthesia Postprocedure Evaluation (Signed)
Anesthesia Post Note  Patient: Katelyn Shelton  Procedure(s) Performed: REMOVAL RIGHT BREAST IMPLANT (Right )     Patient location during evaluation: PACU Anesthesia Type: General Level of consciousness: awake and alert, patient cooperative and oriented Pain management: pain level controlled Vital Signs Assessment: post-procedure vital signs reviewed and stable Respiratory status: spontaneous breathing, nonlabored ventilation and respiratory function stable Cardiovascular status: blood pressure returned to baseline and stable Postop Assessment: no apparent nausea or vomiting Anesthetic complications: no   No complications documented.  Last Vitals:  Vitals:   01/20/21 1745 01/20/21 1800  BP: 130/87 137/87  Pulse: 85 87  Resp: 14 17  Temp:    SpO2: 95% 93%    Last Pain:  Vitals:   01/20/21 1745  TempSrc:   PainSc: 4                  Geno Sydnor,E. Mykala Mccready

## 2021-01-20 NOTE — Consult Note (Addendum)
[facility]                                                                                        Patient Identification: Patient Name: Katelyn Shelton MRN: [id number] [place] Date: 01/20/2021  9:23 AM Today's Date: 01/20/2021 Reason for consult:  Requesting provider:   Active Problems:   Breast infection   Antibiotics: None   Lines/Tubes: PIVs   Assessment 45 Year old caucasian female with a PMH pectus excavatum at birth with silicone implant as part of pectus excavatum repair performed by Dr. Jaclynn Guarneri 1996, Rt breast cancer s/p surgery in 02/2017 ( +LN), bilateral mastectomy and bilateral breast implants in 11/25/2018 s/p chemo ( June -Dec 2018) followed by Radiation who is admitted for recurrent Rt breast cellulitis and possible need for Implant removal   Recommendations  Start on IV daptomycin,  Baseline CPK Plan for OR noted today - would recommend to send tissue for gram stain, aerobic/anaerobic, fungal stain and cx + AFB stain and cultures + pathology  Monitor CBC and BMP while on IV antibiotics Fu blood cultures No need to repeat COVID test if positive h/o COVID within last 90 days per IP   Will follow OR note findings from today for further recommendations   Rest of the management as per the primary team. Please call with questions or concerns.  Thank you for the consult  _________________________________________________________________________________________________________ HPI and Hospital Course: 45 Year old caucasian female with a PMH pectus excavatum at birth with plastic shield under her sternum and under the muscle 1996, Rt breast cancer s/p surgery in 02/2017 ( +LN), bilateral mastectomy and bilateral breast implants in 11/25/2018 s/p chemo ( June -Dec 2018) followed by Radiation who is admitted for Rt breast cellulitis and possible need for Implant removal from [facility] after  seen by Dr Tommy Medal   Seen Dr Johnnye Sima in 12/06/19 for breast cellulitis - started on Cipro one day before. She was given linezolid for 1 day prior to administration of dalbavancin on 2/12. Got another dose of oritavancin 12/17/19. She says she was doing ? Well but continued to have some discomfort or pain in her breasts.. She was started on Doxycycline ONCE a day that was started IN MAY and continued to take it until Decembber 2021. She has been off of doxycycline since January 2022. She saw Dr. Donne Hazel with general surgery and Dr. Abelino Derrick at [facility].  She noticed some pain in her arm pit and breast when she tried to overhead when she was traveling back to the city recently. She also noticed a rash vs petechiae that started last Thursday. By Friday, her rt breast had become more red and tender. By Saturday, she was feeling more sick and started having chills, fevers. She also had several episodes of nausea and vomiting. She was prescribed Linezolid by Dr Johnnye Sima and was set up for an appointment to be seen in the clinic.   She was seen by my partner Dr Tommy Medal this morning as well as yesterday and have arranged for direct hospital admission for IV antibiotics and possible Implant removal.  ROS: General- Denies loss of appetite and loss of weight, FEVERS AND CHILLS + HEENT - Denies headache, blurry vision, neck pain, sinus pain Chest - Denies any chest pain, SOB or cough CVS- Denies any dizziness/lightheadedness, syncopal attacks, palpitations Abdomen- Denies any abdominal pain, hematochezia and diarrhea. NAUSEA AND VOMITING + Neuro - Denies any weakness, numbness, tingling sensation Psych - Denies any changes in mood irritability or depressive symptoms GU- Denies any burning, dysuria, hematuria or increased frequency of urination Skin - denies any rashes/lesions MSK - denies any joint pain/swelling or restricted ROM   Past Medical History:  Diagnosis Date  . Allergy   .  Anxiety    venflafaxine XR 75 mg  . Breast cancer ([place]) 01/2017   right; genetic testing negative in 2018 with [facility] panel  . Heart murmur    as child- went away  . History of MRSA infection    elbow  . Hyperlipidemia   . Infection of breast implant ([place]) 01/19/2021  . Joint pain    due to medicine induced joint pain  . Migraine    otc med prn  . Miscarriage    x1  . Nausea and vomiting 01/19/2021  . Neuromuscular disorder ([place])    neuropathy bilater feet - ? 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 Osteopenia   . UTI (urinary tract infection)     Past Surgical History:  Procedure Laterality Date  . AREOLA/NIPPLE RECONSTRUCTION WITH GRAFT Right 12/01/2018   Procedure: RIGHT NIPPLE AREOLA COMPLEX RECONSTRUCTION WITH FULL THICKNESS SKIN GRAFT FROM RIGHT GROIN;  Surgeon: Irene Limbo, MD;  Location: [place];  Service: Plastics;  Laterality: Right;  . BREAST RECONSTRUCTION Left 12/01/2018   Procedure: LEFT BREAST REVISION RECONSTRUCTION WITH SKIN EXCISION;  Surgeon: Irene Limbo, MD;  Location: [place];  Service: Plastics;  Laterality: Left;  . BREAST RECONSTRUCTION Left 12/01/2018   Procedure: COMPOSITE GRAFT FROM LEFT NIPPLE (NIPPLE SHARING);  Surgeon: Irene Limbo, MD;  Location: [place];  Service: Plastics;  Laterality: Left;  . BREAST RECONSTRUCTION WITH PLACEMENT OF TISSUE EXPANDER AND FLEX HD (ACELLULAR HYDRATED DERMIS) Bilateral 02/28/2017   Procedure: BILATERAL BREAST RECONSTRUCTION WITH PLACEMENT OF TISSUE EXPANDER AND ALLODERM;  Surgeon: Irene Limbo, MD;  Location: [place];  Service: Plastics;  Laterality: Bilateral;  . CESAREAN SECTION  2008,2010,2013   x 3  . CESAREAN SECTION  02/04/2012   Procedure: CESAREAN SECTION;  Surgeon: Cyril Mourning, MD;  Location: [facility];  Service: Gynecology;  Laterality: N/A;  Repeat Cesarean Section Delivery  Boy  @  [phone], Apgars 9/10  . COLONOSCOPY     polyp  . DILATION AND  CURETTAGE OF UTERUS    . EXCISION OF BREAST LESION Right 03/15/2017   Procedure: RIGHT NIPPLE AND AREOLA EXCISION;  Surgeon: Rolm Bookbinder, MD;  Location: [facility];  Service: General;  Laterality: Right;  . LAPAROSCOPIC VAGINAL HYSTERECTOMY WITH SALPINGO OOPHORECTOMY Bilateral 06/29/2018   Procedure: LAPAROSCOPIC ASSISTED VAGINAL HYSTERECTOMY WITH SALPINGO OOPHORECTOMY;  Surgeon: Louretta Shorten, MD;  Location: [facility];  Service: Gynecology;  Laterality: Bilateral;  Request case for [facility] CRNA  . LIPOSUCTION WITH LIPOFILLING N/A 07/25/2018   Procedure: LIPOSUCTION WITH LIPOFILLING FROM ABDOMEN TO CHEST;  Surgeon: Irene Limbo, MD;  Location: [place];  Service: Plastics;  Laterality: N/A;  . LIPOSUCTION WITH LIPOFILLING Bilateral 12/01/2018   Procedure: LIPOFILLING TO BILATERAL CHEST REVISION;  Surgeon: Irene Limbo, MD;  Location: [place];  Service: Plastics;  Laterality: Bilateral;  . NIPPLE SPARING MASTECTOMY Bilateral 02/28/2017   Procedure: RIGHT NIPPLE SPARING MASTECTOMY; LEFT PROPHYLACTIC NIPPLE SPARING MASTECTOMY;  Surgeon: Rolm Bookbinder, MD;  Location: [place];  Service: General;  Laterality: Bilateral;  . PECTUS EXCAVATUM REPAIR  6960   45 years old- very large implant  . REMOVAL OF TISSUE EXPANDER AND PLACEMENT OF IMPLANT Bilateral 07/25/2018   Procedure: REMOVAL OF TISSUE EXPANDER AND PLACEMENT OF SILICONE IMPLANT;  Surgeon: Irene Limbo, MD;  Location: [place];  Service: Plastics;  Laterality: Bilateral;  . SENTINEL NODE BIOPSY Right 02/28/2017   Procedure: RIGHT AXILLARY SENTINEL LYMPH  NODE BIOPSY;  Surgeon: Rolm Bookbinder, MD;  Location: [place];  Service: General;  Laterality: Right;  . VULVAR LESION REMOVAL    . WISDOM TOOTH EXTRACTION      Scheduled Meds: Continuous Infusions: . sodium chloride     PRN Meds:.acetaminophen **OR** acetaminophen,  ondansetron **OR** ondansetron (ZOFRAN) IV  Allergies  Allergen Reactions  . Fentanyl Itching    Severe itching - Patient refuses this medication  . Peach Flavor Anaphylaxis  . Penicillins Hives    AS A CHILD  . Sulfa Antibiotics Nausea And Vomiting  . Sulfasalazine Nausea And Vomiting and Other (See Comments)  . Latex Itching   Social History   Socioeconomic History  . Marital status: Married    Spouse name: [name]  . Number of children: Not on file  . Years of education: Not on file  . Highest education level: Not on file  Occupational History  . Not on file  Tobacco Use  . Smoking status: Never Smoker  . Smokeless tobacco: Never Used  Vaping Use  . Vaping Use: Never used  Substance and Sexual Activity  . Alcohol use: Yes    Alcohol/week: 1.0 - 2.0 standard drink    Types: 1 - 2 Standard drinks or equivalent per week    Comment: occasional wine - twice monthly  . Drug use: No  . Sexual activity: Yes    Birth control/protection: Surgical  Other Topics Concern  . Not on file  Social History Narrative   Married to Dr. Aundra Dubin. 3 children.    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    Masters in public history at [employer]. Undergrad at [employer]: running, reading, exercise   Social Determinants of Health   Financial Resource Strain: Not on file  Food Insecurity: Not on file  Transportation Needs: Not on file  Physical Activity: Not on file  Stress: Not on file  Social Connections: Not on file  Intimate Partner Violence: Not on file    Vitals BP 109/64 (BP Location: Left Arm)   Pulse 84   Temp 98.7 F (37.1 C) (Oral)   Resp 18   LMP 09/01/2017   SpO2 98%    Physical Exam Constitutional:  acutely ill, not in acute distress    Comments:   Cardiovascular:     Rate and Rhythm: Normal rate and regular rhythm.     Heart sounds: No murmur heard.   Pulmonary:     Effort: Pulmonary effort is normal.      Comments:   Abdominal:     Palpations: Abdomen is soft.     Tenderness: Non tender   Musculoskeletal:        General: No swelling or tenderness.   Skin:    Comments:  RT breast erythema/tenderness, no axillary LN swelling or erythema in the LUE or neck. RT breast appears to be WNL   Neurological:     General: No focal deficit present.   Psychiatric:        Mood and Affect: Mood normal.     Pertinent Microbiology Results for orders placed or performed in visit on 05/16/18  Urine Culture     Status: Abnormal   Collection Time: 05/16/18 11:26 AM   Specimen: Urine  Result Value Ref Range Status   MICRO NUMBER: [id number]  Final   SPECIMEN QUALITY: ADEQUATE  Final   Sample Source NOT GIVEN  Final   STATUS: FINAL  Final   ISOLATE 1: Escherichia coli (A)  Final    Comment: 10,000-50,000 CFU/mL of Escherichia coli      Susceptibility   Escherichia coli - URINE CULTURE, REFLEX    AMOX/CLAVULANIC <=2 Sensitive     AMPICILLIN <=2 Sensitive     AMPICILLIN/SULBACTAM <=2 Sensitive     CEFAZOLIN* <=4 Not Reportable      * For infections other than uncomplicated UTIcaused by E. coli, K. pneumoniae or P. mirabilis:Cefazolin is resistant if MIC > or = 8 mcg/mL.(Distinguishing susceptible versus intermediatefor isolates with MIC < or = 4 mcg/mL requiresadditional testing.)For uncomplicated UTI caused by E. coli,K. pneumoniae or P. mirabilis: Cefazolin issusceptible if MIC <32 mcg/mL and predictssusceptible to the oral agents cefaclor, cefdinir,cefpodoxime, cefprozil, cefuroxime, cephalexinand loracarbef.    CEFEPIME <=1 Sensitive     CEFTRIAXONE <=1 Sensitive     CIPROFLOXACIN <=0.25 Sensitive     LEVOFLOXACIN <=0.12 Sensitive     ERTAPENEM <=0.5 Sensitive     GENTAMICIN <=1 Sensitive     IMIPENEM <=0.25 Sensitive     NITROFURANTOIN <=16 Sensitive     PIP/TAZO <=4 Sensitive     TOBRAMYCIN <=1 Sensitive     TRIMETH/SULFA* <=20 Sensitive      * For infections other than uncomplicated  UTIcaused by E. coli, K. pneumoniae or P. mirabilis:Cefazolin is resistant if MIC > or = 8 mcg/mL.(Distinguishing susceptible versus intermediatefor isolates with MIC < or = 4 mcg/mL requiresadditional testing.)For uncomplicated UTI caused by E. coli,K. pneumoniae or P. mirabilis: Cefazolin issusceptible if MIC <32 mcg/mL and predictssusceptible to the oral agents cefaclor, cefdinir,cefpodoxime, cefprozil, cefuroxime, cephalexinand loracarbef.Legend:S = Susceptible  I = IntermediateR = Resistant  NS = Not susceptible* = Not tested  NR = Not reported**NN = See antimicrobic comments      Pertinent Lab seen by me: CBC Latest Ref Rng & Units 01/19/2021 01/15/2021 11/15/2020  WBC 4.0 - 10.5 K/uL 16.7(H) 5.0 6.9  Hemoglobin 12.0 - 15.0 g/dL 13.9 14.4 15.2(H)  Hematocrit 36.0 - 46.0 % 40.9 41.4 45.3  Platelets 150 - 400 K/uL 197 250 248   CMP Latest Ref Rng & Units 01/19/2021 01/15/2021 12/02/2020  Glucose 70 - 99 mg/dL 117(H) 103(H) -  BUN 6 - 20 mg/dL 12 12 -  Creatinine 0.44 - 1.00 mg/dL 0.72 0.76 -  Sodium 135 - 145 mmol/L 137 141 -  Potassium 3.5 - 5.1 mmol/L 3.6 4.3 -  Chloride 98 - 111 mmol/L 103 105 -  CO2 22 - 32 mmol/L 25 24 -  Calcium 8.9 - 10.3 mg/dL 8.4(L) 9.4 -  Total Protein 6.5 - 8.1 g/dL - 7.6 7.8  Total Bilirubin 0.3 - 1.2 mg/dL - 0.5 0.8  Alkaline Phos 38 - 126 U/L - 48 40  AST 15 - 41 U/L - 38 30  ALT 0 -  44 U/L - 68(H) 45(H)    Pertinent Imagings/Other Imagings Plain films and CT images have been personally visualized and interpreted; radiology reports have been reviewed. Decision making incorporated into the Impression / Recommendations.   I have spent 60 minutes for this patient encounter including review of prior medical records with greater than 50% of time being face to face and coordination of their care.  Electronically signed by:   Rosiland Oz, MD Infectious Disease Physician [facility] Pager: [phone]

## 2021-01-20 NOTE — Transfer of Care (Signed)
Immediate Anesthesia Transfer of Care Note  Patient: Katelyn Shelton  Procedure(s) Performed: REMOVAL RIGHT BREAST IMPLANT (Right )  Patient Location: PACU  Anesthesia Type:General  Level of Consciousness: awake, alert , oriented and patient cooperative  Airway & Oxygen Therapy: Patient Spontanous Breathing and Patient connected to nasal cannula oxygen  Post-op Assessment: Report given to RN, Post -op Vital signs reviewed and stable and Patient moving all extremities X 4  Post vital signs: Reviewed and stable  Last Vitals:  Vitals Value Taken Time  BP 142/91 01/20/21 1713  Temp    Pulse 96 01/20/21 1715  Resp 19 01/20/21 1715  SpO2 96 % 01/20/21 1715  Vitals shown include unvalidated device data.  Last Pain:  Vitals:   01/20/21 1511  TempSrc: Oral  PainSc:       Patients Stated Pain Goal: 0 (33/58/25 1898)  Complications: No complications documented.

## 2021-01-21 ENCOUNTER — Encounter (HOSPITAL_BASED_OUTPATIENT_CLINIC_OR_DEPARTMENT_OTHER): Admission: RE | Payer: Self-pay | Source: Home / Self Care

## 2021-01-21 ENCOUNTER — Encounter (HOSPITAL_COMMUNITY): Payer: Self-pay | Admitting: Plastic Surgery

## 2021-01-21 ENCOUNTER — Ambulatory Visit (HOSPITAL_BASED_OUTPATIENT_CLINIC_OR_DEPARTMENT_OTHER): Admission: RE | Admit: 2021-01-21 | Payer: 59 | Source: Home / Self Care | Admitting: Plastic Surgery

## 2021-01-21 DIAGNOSIS — Z9882 Breast implant status: Secondary | ICD-10-CM | POA: Diagnosis not present

## 2021-01-21 DIAGNOSIS — Z91018 Allergy to other foods: Secondary | ICD-10-CM | POA: Diagnosis not present

## 2021-01-21 DIAGNOSIS — N61 Mastitis without abscess: Secondary | ICD-10-CM | POA: Diagnosis not present

## 2021-01-21 DIAGNOSIS — Z853 Personal history of malignant neoplasm of breast: Secondary | ICD-10-CM | POA: Diagnosis not present

## 2021-01-21 DIAGNOSIS — Z20822 Contact with and (suspected) exposure to covid-19: Secondary | ICD-10-CM | POA: Diagnosis present

## 2021-01-21 DIAGNOSIS — B9562 Methicillin resistant Staphylococcus aureus infection as the cause of diseases classified elsewhere: Secondary | ICD-10-CM | POA: Diagnosis present

## 2021-01-21 DIAGNOSIS — Z9013 Acquired absence of bilateral breasts and nipples: Secondary | ICD-10-CM | POA: Diagnosis not present

## 2021-01-21 DIAGNOSIS — Z91040 Latex allergy status: Secondary | ICD-10-CM | POA: Diagnosis not present

## 2021-01-21 DIAGNOSIS — Z882 Allergy status to sulfonamides status: Secondary | ICD-10-CM | POA: Diagnosis not present

## 2021-01-21 DIAGNOSIS — Q686 Discoid meniscus: Secondary | ICD-10-CM | POA: Diagnosis not present

## 2021-01-21 DIAGNOSIS — Z885 Allergy status to narcotic agent status: Secondary | ICD-10-CM | POA: Diagnosis not present

## 2021-01-21 DIAGNOSIS — L03313 Cellulitis of chest wall: Secondary | ICD-10-CM | POA: Diagnosis present

## 2021-01-21 DIAGNOSIS — Z9221 Personal history of antineoplastic chemotherapy: Secondary | ICD-10-CM | POA: Diagnosis not present

## 2021-01-21 DIAGNOSIS — Y831 Surgical operation with implant of artificial internal device as the cause of abnormal reaction of the patient, or of later complication, without mention of misadventure at the time of the procedure: Secondary | ICD-10-CM | POA: Diagnosis present

## 2021-01-21 DIAGNOSIS — T8579XA Infection and inflammatory reaction due to other internal prosthetic devices, implants and grafts, initial encounter: Secondary | ICD-10-CM | POA: Diagnosis present

## 2021-01-21 DIAGNOSIS — B9689 Other specified bacterial agents as the cause of diseases classified elsewhere: Secondary | ICD-10-CM | POA: Diagnosis not present

## 2021-01-21 DIAGNOSIS — Z9886 Personal history of breast implant removal: Secondary | ICD-10-CM

## 2021-01-21 DIAGNOSIS — Z880 Allergy status to penicillin: Secondary | ICD-10-CM | POA: Diagnosis not present

## 2021-01-21 LAB — C-REACTIVE PROTEIN: CRP: 30.9 mg/dL — ABNORMAL HIGH (ref <1.0)

## 2021-01-21 LAB — CK: Total CK: 51 U/L (ref 38–234)

## 2021-01-21 LAB — SEDIMENTATION RATE: Sed Rate: 107 mm/hr — ABNORMAL HIGH (ref 0–22)

## 2021-01-21 LAB — CBC
HCT: 37.3 % (ref 36.0–46.0)
Hemoglobin: 12.8 g/dL (ref 12.0–15.0)
MCH: 31.9 pg (ref 26.0–34.0)
MCHC: 34.3 g/dL (ref 30.0–36.0)
MCV: 93 fL (ref 80.0–100.0)
Platelets: 226 10*3/uL (ref 150–400)
RBC: 4.01 MIL/uL (ref 3.87–5.11)
RDW: 12.6 % (ref 11.5–15.5)
WBC: 13.5 10*3/uL — ABNORMAL HIGH (ref 4.0–10.5)
nRBC: 0 % (ref 0.0–0.2)

## 2021-01-21 SURGERY — REMOVAL, IMPLANT, BREAST
Anesthesia: General | Site: Breast | Laterality: Right

## 2021-01-21 NOTE — Progress Notes (Addendum)
[facility] Infectious Diseases Follow Up Note  Patient Identification: Patient Name: Katelyn Shelton MRN: [id number] [place] Date: 01/20/2021  9:23 AM Age: 45 y.o.Today's Date: 01/21/2021   Reason for Visit: Chest wall cellulitis   Active Problems:   Breast infection   Antibiotics: Daptomycin 3/29-c  Lines/Tubes: PIVs   Interval Events: continues to remain afebrile, leukcocytosis is downtrending with IV antibiotics Went to OR for implant removal    Assessment # Rt Breast cellulitis s/p implant removal on 3/29. OR cultures GPC in pair and clusters in Gram stain  # Rt breast cancer s/p surgery in 02/2017 ( +LN), bilateral mastectomy and bilateral breast implants in 11/25/2018 s/p chemo ( June -Dec 2018) followed by Radiation  # Pectus excavatum at birth with silicone implant as part of pectus excavatum repair performed by Dr. Jaclynn Guarneri 1996  Recommendations Continue Daptomycin Fu blood cultures and OR cultures from 3/29 Monitor CBC, BMP and CPK on IV antibiotics I  Strongly think she needs couple more days of IV antibiotics given she still has significant erythema of overlying skin in her rt breast Anticipate PO antibiotics upon discharge pending cultures. Possibly linezolid, would need to stop effexor while she is on linezolid Will make a follow up at [place] of the management as per the primary team. Thank you for the consult. Please page with pertinent questions or concerns.  ______________________________________________________________________ Subjective patient seen and examined at the bedside. Feels better than yesterday. Denies N/V/D and rashes. Redness in the rt breast seems to be better than yesterday    Vitals BP 112/69 (BP Location: Left Arm)   Pulse 74   Temp 98.5 F (36.9 C)   Resp 17   LMP 09/01/2017   SpO2 97%     Physical Exam Constitutional:   Not in acute distress, does not look toxic     Comments:   Cardiovascular:     Rate and Rhythm: Normal rate and regular rhythm.     Heart sounds:   Pulmonary:     Effort: Pulmonary effort is normal.     Comments:   Abdominal:     Palpations: Abdomen is soft.     Tenderness:   Musculoskeletal:        General: No swelling or tenderness.   Skin:    Comments: Right breast still has significant edema, has a drain with serosanguineous fluid  Neurological:     General: No focal deficit present.   Psychiatric:        Mood and Affect: Mood normal.     Pertinent Microbiology Results for orders placed or performed during the hospital encounter of 01/20/21  Resp Panel by RT-PCR (Flu A&B, Covid) Nasopharyngeal Swab     Status: None   Collection Time: 01/20/21 11:03 AM   Specimen: Nasopharyngeal Swab; Nasopharyngeal(NP) swabs in vial transport medium  Result Value Ref Range Status   SARS Coronavirus 2 by RT PCR NEGATIVE NEGATIVE Final    Comment: (NOTE) SARS-CoV-2 target nucleic acids are NOT DETECTED.  The SARS-CoV-2 RNA is generally detectable in upper respiratory specimens during the acute phase of infection. The lowest concentration of SARS-CoV-2 viral copies this assay can detect is 138 copies/mL. A negative result does not preclude SARS-Cov-2 infection and should not be used as the sole basis for treatment or other patient management decisions. A negative result may occur with  improper specimen collection/handling, submission of specimen other than nasopharyngeal swab, presence of viral mutation(s) within the areas targeted by this assay,  and inadequate number of viral copies(<138 copies/mL). A negative result must be combined with clinical observations, patient history, and epidemiological information. The expected result is Negative.  Fact Sheet for Patients:  EntrepreneurPulse.com.au  Fact Sheet for Healthcare Providers:  IncredibleEmployment.be  This test is no t yet  approved or cleared by the [place] FDA and  has been authorized for detection and/or diagnosis of SARS-CoV-2 by FDA under an Emergency Use Authorization (EUA). This EUA will remain  in effect (meaning this test can be used) for the duration of the COVID-19 declaration under Section 564(b)(1) of the Act, 21 U.S.C.section 360bbb-3(b)(1), unless the authorization is terminated  or revoked sooner.       Influenza A by PCR NEGATIVE NEGATIVE Final   Influenza B by PCR NEGATIVE NEGATIVE Final    Comment: (NOTE) The Xpert Xpress SARS-CoV-2/FLU/RSV plus assay is intended as an aid in the diagnosis of influenza from Nasopharyngeal swab specimens and should not be used as a sole basis for treatment. Nasal washings and aspirates are unacceptable for Xpert Xpress SARS-CoV-2/FLU/RSV testing.  Fact Sheet for Patients: EntrepreneurPulse.com.au  Fact Sheet for Healthcare Providers: IncredibleEmployment.be  This test is not yet approved or cleared by the [place] FDA and has been authorized for detection and/or diagnosis of SARS-CoV-2 by FDA under an Emergency Use Authorization (EUA). This EUA will remain in effect (meaning this test can be used) for the duration of the COVID-19 declaration under Section 564(b)(1) of the Act, 21 U.S.C. section 360bbb-3(b)(1), unless the authorization is terminated or revoked.  Performed at [facility], [place] [address], [place], [place] [address]   Aerobic/Anaerobic Culture w Gram Stain (surgical/deep wound)     Status: None (Preliminary result)   Collection Time: 01/20/21  4:23 PM   Specimen: Chest; Wound  Result Value Ref Range Status   Specimen Description WOUND CHEST  Final   Special Requests PT ON VANC  Final   Gram Stain   Final    ABUNDANT WBC PRESENT, PREDOMINANTLY PMN MODERATE GRAM POSITIVE COCCI IN PAIRS IN CLUSTERS Performed at [facility], [place] [address], [place], [place] [address]     Culture PENDING  Incomplete   Report Status PENDING  Incomplete   Pertinent labs CBC Latest Ref Rng & Units 01/21/2021 01/20/2021 01/19/2021  WBC 4.0 - 10.5 K/uL 13.5(H) 13.3(H) 16.7(H)  Hemoglobin 12.0 - 15.0 g/dL 12.8 12.4 13.9  Hematocrit 36.0 - 46.0 % 37.3 37.2 40.9  Platelets 150 - 400 K/uL 226 189 197   CMP Latest Ref Rng & Units 01/20/2021 01/19/2021 01/15/2021  Glucose 70 - 99 mg/dL 124(H) 117(H) 103(H)  BUN 6 - 20 mg/dL 9 12 12   Creatinine 0.44 - 1.00 mg/dL 0.63 0.72 0.76  Sodium 135 - 145 mmol/L 136 137 141  Potassium 3.5 - 5.1 mmol/L 3.3(L) 3.6 4.3  Chloride 98 - 111 mmol/L 102 103 105  CO2 22 - 32 mmol/L 24 25 24   Calcium 8.9 - 10.3 mg/dL 8.5(L) 8.4(L) 9.4  Total Protein 6.5 - 8.1 g/dL - - 7.6  Total Bilirubin 0.3 - 1.2 mg/dL - - 0.5  Alkaline Phos 38 - 126 U/L - - 48  AST 15 - 41 U/L - - 38  ALT 0 - 44 U/L - - 68(H)     Pertinent Imaging today Plain films and CT images have been personally visualized and interpreted; radiology reports have been reviewed. Decision making incorporated into the Impression / Recommendations.  I have spent approx 30 minutes  for this patient encounter including review of prior medical records, coordination of care  with greater than 50% of time being face to face/counseling and discussing diagnostics/treatment plan with the patient/family.  Electronically signed by:   Rosiland Oz, MD Infectious Disease Physician [facility] for Infectious Disease Pager: [phone]

## 2021-01-21 NOTE — Progress Notes (Signed)
Pt would like to know when to expect to be d/c from infectious disease standpoint so she can plan for her family accordingly.

## 2021-01-21 NOTE — Progress Notes (Signed)
Plastic Surgery  POD#1 removal right chest implant  Temp:  [97.9 F (36.6 C)-98.7 F (37.1 C)] 98.5 F (36.9 C) (03/30 0433) Pulse Rate:  [74-98] 74 (03/30 0433) Resp:  [14-19] 17 (03/30 0433) BP: (105-142)/(53-92) 112/69 (03/30 0433) SpO2:  [93 %-100 %] 97 % (03/30 0433)   JP 100 ml Tired but reports no nausea  PE Sleeping, easily arousable  Chest dressing dry incision intact with steris in place, some residual edema erythema upper right chest   A/P Stable for discharge from plastics standpoint. Will await ID recs, cultures pending. Next dose Daptomycin later this am- possible discharge following this. Has scheduled f/u week Patient considering removal left breast implant- reviewed timing of this.

## 2021-01-22 ENCOUNTER — Other Ambulatory Visit: Payer: Self-pay | Admitting: Plastic Surgery

## 2021-01-22 DIAGNOSIS — B9689 Other specified bacterial agents as the cause of diseases classified elsewhere: Secondary | ICD-10-CM | POA: Diagnosis not present

## 2021-01-22 DIAGNOSIS — N61 Mastitis without abscess: Secondary | ICD-10-CM | POA: Diagnosis not present

## 2021-01-22 DIAGNOSIS — Z853 Personal history of malignant neoplasm of breast: Secondary | ICD-10-CM | POA: Diagnosis not present

## 2021-01-22 DIAGNOSIS — Q686 Discoid meniscus: Secondary | ICD-10-CM | POA: Diagnosis not present

## 2021-01-22 LAB — CBC
HCT: 37.1 % (ref 36.0–46.0)
Hemoglobin: 12.3 g/dL (ref 12.0–15.0)
MCH: 32.3 pg (ref 26.0–34.0)
MCHC: 33.2 g/dL (ref 30.0–36.0)
MCV: 97.4 fL (ref 80.0–100.0)
Platelets: 262 10*3/uL (ref 150–400)
RBC: 3.81 MIL/uL — ABNORMAL LOW (ref 3.87–5.11)
RDW: 13 % (ref 11.5–15.5)
WBC: 10.2 10*3/uL (ref 4.0–10.5)
nRBC: 0 % (ref 0.0–0.2)

## 2021-01-22 MED ORDER — ORITAVANCIN DIPHOSPHATE 400 MG IV SOLR
1200.0000 mg | Freq: Once | INTRAVENOUS | Status: AC
  Administered 2021-01-22: 1200 mg via INTRAVENOUS
  Filled 2021-01-22: qty 120

## 2021-01-22 MED ORDER — OXYCODONE HCL 5 MG PO TABS: 5.0000 mg | ORAL_TABLET | Freq: Four times a day (QID) | ORAL | 0 refills | Status: DC | PRN

## 2021-01-22 NOTE — Progress Notes (Signed)
[facility] Infectious Diseases Follow Up Note  Patient Identification: Patient Name: Katelyn Shelton MRN: [id number] [place] Date: 01/20/2021  9:23 AM Age: 45 y.o.Today's Date: 01/22/2021   Reason for Visit: Breast cellulitis  Active Problems:   Breast infection  Antibiotics: Daptomycin 3/29-c  Lines/Tubes: PIVs    Interval Events: Continues to remain afebrile, no leukocytosis, or cultures 3/29 grew staph aureus  Assessment Right breast cellulitis status post implant removal on 3/29.  OR cultures growing staph aureus Right breast cancer status post surgery in 02/2017 with positive lymph node, bilateral mastectomy and bilateral breast implants in 11/25/2018 status post chemo in June-December 2018 followed by radiation Pectus excavatum at birth with silicone implant as part of pectus excavatum repaired by Dr. Jaclynn Guarneri in 1996  Recommendations Erythema in the rt breast seems to have significantly improved on exam in comparison to 2 days prior. Discussed about potential options for discharge - long acting agents vs PO linezolid/tedizolid. Patient is on effexor for depression and seems to have withdrawal when she was off of it briefly when she had taken linezolid in the past. Will plan for IV oritanvancin one dose today. She has a follow up with Dr Tommy Medal 4/16 at 11:15 am.   Rest of the management as per the primary team. Thank you for the consult. Please page with pertinent questions or concerns.  ______________________________________________________________________ Subjective patient seen and examined at the bedside. Looks a lot better, feels better. Denies fevers, chills and sweats. Denies Nausea/vomiting and diarrhea. Redness in the rt breast also seems to have improved.   Vitals BP 113/64 (BP Location: Left Arm)   Pulse (!) 54   Temp 98 F (36.7 C) (Oral)   Resp 16   LMP 09/01/2017   SpO2 98%     Physical  Exam Constitutional:  Not in acute distress    Comments:   Cardiovascular:     Rate and Rhythm: Normal rate and regular rhythm.     Heart sounds:   Pulmonary:     Effort: Pulmonary effort is normal.     Comments:   Abdominal:     Palpations: Abdomen is soft.     Tenderness:   Musculoskeletal:        General: No swelling or tenderness.   Skin:    Comments: Rt breast site - has erythema which seems to have significantly improved   Neurological:     General: No focal deficit present.   Psychiatric:        Mood and Affect: Mood normal.    Pertinent Microbiology Results for orders placed or performed during the hospital encounter of 01/20/21  Resp Panel by RT-PCR (Flu A&B, Covid) Nasopharyngeal Swab     Status: None   Collection Time: 01/20/21 11:03 AM   Specimen: Nasopharyngeal Swab; Nasopharyngeal(NP) swabs in vial transport medium  Result Value Ref Range Status   SARS Coronavirus 2 by RT PCR NEGATIVE NEGATIVE Final    Comment: (NOTE) SARS-CoV-2 target nucleic acids are NOT DETECTED.  The SARS-CoV-2 RNA is generally detectable in upper respiratory specimens during the acute phase of infection. The lowest concentration of SARS-CoV-2 viral copies this assay can detect is 138 copies/mL. A negative result does not preclude SARS-Cov-2 infection and should not be used as the sole basis for treatment or other patient management decisions. A negative result may occur with  improper specimen collection/handling, submission of specimen other than nasopharyngeal swab, presence of viral mutation(s) within the areas targeted by this assay, and inadequate number  of viral copies(<138 copies/mL). A negative result must be combined with clinical observations, patient history, and epidemiological information. The expected result is Negative.  Fact Sheet for Patients:  EntrepreneurPulse.com.au  Fact Sheet for Healthcare Providers:   IncredibleEmployment.be  This test is no t yet approved or cleared by the [place] FDA and  has been authorized for detection and/or diagnosis of SARS-CoV-2 by FDA under an Emergency Use Authorization (EUA). This EUA will remain  in effect (meaning this test can be used) for the duration of the COVID-19 declaration under Section 564(b)(1) of the Act, 21 U.S.C.section 360bbb-3(b)(1), unless the authorization is terminated  or revoked sooner.       Influenza A by PCR NEGATIVE NEGATIVE Final   Influenza B by PCR NEGATIVE NEGATIVE Final    Comment: (NOTE) The Xpert Xpress SARS-CoV-2/FLU/RSV plus assay is intended as an aid in the diagnosis of influenza from Nasopharyngeal swab specimens and should not be used as a sole basis for treatment. Nasal washings and aspirates are unacceptable for Xpert Xpress SARS-CoV-2/FLU/RSV testing.  Fact Sheet for Patients: EntrepreneurPulse.com.au  Fact Sheet for Healthcare Providers: IncredibleEmployment.be  This test is not yet approved or cleared by the [place] FDA and has been authorized for detection and/or diagnosis of SARS-CoV-2 by FDA under an Emergency Use Authorization (EUA). This EUA will remain in effect (meaning this test can be used) for the duration of the COVID-19 declaration under Section 564(b)(1) of the Act, 21 U.S.C. section 360bbb-3(b)(1), unless the authorization is terminated or revoked.  Performed at [facility], [place] [address] [place], [place] [address]   Aerobic/Anaerobic Culture w Gram Stain (surgical/deep wound)     Status: None (Preliminary result)   Collection Time: 01/20/21  4:23 PM   Specimen: Chest; Wound  Result Value Ref Range Status   Specimen Description WOUND CHEST  Final   Special Requests PT ON VANC  Final   Gram Stain   Final    ABUNDANT WBC PRESENT, PREDOMINANTLY PMN MODERATE GRAM POSITIVE COCCI IN PAIRS IN CLUSTERS    Culture    Final    ABUNDANT STAPHYLOCOCCUS AUREUS CULTURE REINCUBATED FOR BETTER GROWTH Performed at [facility], [place] [address] [place], [place] [address]    Report Status PENDING  Incomplete    Pertinent Lab. CBC Latest Ref Rng & Units 01/22/2021 01/21/2021 01/20/2021  WBC 4.0 - 10.5 K/uL 10.2 13.5(H) 13.3(H)  Hemoglobin 12.0 - 15.0 g/dL 12.3 12.8 12.4  Hematocrit 36.0 - 46.0 % 37.1 37.3 37.2  Platelets 150 - 400 K/uL 262 226 189   CMP Latest Ref Rng & Units 01/20/2021 01/19/2021 01/15/2021  Glucose 70 - 99 mg/dL 124(H) 117(H) 103(H)  BUN 6 - 20 mg/dL 9 12 12   Creatinine 0.44 - 1.00 mg/dL 0.63 0.72 0.76  Sodium 135 - 145 mmol/L 136 137 141  Potassium 3.5 - 5.1 mmol/L 3.3(L) 3.6 4.3  Chloride 98 - 111 mmol/L 102 103 105  CO2 22 - 32 mmol/L 24 25 24   Calcium 8.9 - 10.3 mg/dL 8.5(L) 8.4(L) 9.4  Total Protein 6.5 - 8.1 g/dL - - 7.6  Total Bilirubin 0.3 - 1.2 mg/dL - - 0.5  Alkaline Phos 38 - 126 U/L - - 48  AST 15 - 41 U/L - - 38  ALT 0 - 44 U/L - - 68(H)     Pertinent Imaging today Plain films and CT images have been personally visualized and interpreted; radiology reports have been reviewed. Decision making incorporated into the  Impression / Recommendations.  I have spent approx 30 minutes for this patient encounter including review of prior medical records, coordination of care  with greater than 50% of time being face to face/counseling and discussing diagnostics/treatment plan with the patient/family.  Electronically signed by:   Rosiland Oz, MD Infectious Disease Physician [facility] Pager: [phone]

## 2021-01-22 NOTE — Progress Notes (Signed)
Plastic Surgery  POD#2 removal right chest implant  Temp:  [98 F (36.7 C)-98.2 F (36.8 C)] 98 F (36.7 C) (03/31 0535) Pulse Rate:  [54-77] 54 (03/31 0535) Resp:  [16-18] 16 (03/31 0535) BP: (104-116)/(64-69) 113/64 (03/31 0535) SpO2:  [98 %] 98 % (03/31 0535)   JP not recorded I&Os not recorded Tolerated diet  PE Sleeping, easily arousable  Chest dressing dry incision intact with steris in place, improved edema and small residual erythema lateral right chest   A/P Stable for discharge from plastics standpoint.  Reviewed current culture results S aureus- await ID recommendations for OP treatment Daptomycin administered at 2100 last evening Has scheduled f/u week Ok to shower

## 2021-01-22 NOTE — Progress Notes (Signed)
Katelyn Shelton to be D/C'd  per MD order. Discussed with the patient and all questions fully answered.  VSS, Skin clean, dry and intact without evidence of skin break down, no evidence of skin tears noted.  IV catheter discontinued intact. Site without signs and symptoms of complications. Dressing and pressure applied.  An After Visit Summary was printed and given to the patient.  Pt called pharmacy to decline oxycodone prescription d/t needing to take benadryl with it. Tylenol and robaxin are what eased the pain at the hospital.  D/c education completed with patient/family including follow up instructions, medication list, d/c activities limitations if indicated, with other d/c instructions as indicated by MD - patient able to verbalize understanding, all questions fully answered.   Patient instructed to return to ED, call 911, or call MD for any changes in condition.   Patient to be escorted via [place], and D/C home via private auto.

## 2021-01-22 NOTE — Discharge Summary (Signed)
Physician Discharge Summary  Patient ID: Katelyn Shelton MRN: [id number] DOB/AGE: [birth year] 45 y.o.  Admit date: 01/20/2021 Discharge date: 01/22/2021  Admission Diagnoses: History breast cancer, acquired absence breasts, history therapeutic radiation, right chest implant infection  Discharge Diagnoses:  same  Discharged Condition: stable  Hospital Course: Patient admitted for treatment right chest implant associated infection. Patient is nearly 4 years post operative from bilateral mastectomies with implant based reconstruction. Patient developed onset fever and erythema over right radiated chest which did not improve with oral antibiotics. She underwent removal right breast implant. Intraoperative cultures positive for S. Aureus, sensitivities pending at time of discharge. ID managed antibiotic treatment with daptomycin given for 2 days followed by Howell Pringle infusion on day of discharge. Patient remained afebrile post operatively with near resolution cellulitis at time of discharge.  Consults: ID  Significant Diagnostic Studies: microbiology: wound culture: positive for S. aureus  Treatments: surgery: removal right chest breast implant 3.29.22  Discharge Exam: Blood pressure 113/64, pulse (!) 54, temperature 98 F (36.7 C), temperature source Oral, resp. rate 16, last menstrual period 09/01/2017, SpO2 98 %. Incision/Wound: steris in place over incision dry JP serosanguinous some residual erythema noted right lateral chest  Disposition: Discharge disposition: 01-Home or Self Care       Discharge Instructions    Call MD for:  redness, tenderness, or signs of infection (pain, swelling, bleeding, redness, odor or green/yellow discharge around incision site)   Complete by: As directed    Call MD for:  temperature >100.5   Complete by: As directed    Discharge instructions   Complete by: As directed    Ok to shower. Soap and water ok, pat steri strips dry. No creams or ointments  over incisions. Do not let drain dangle in shower, attach to lanyard or similar.Strip and record drain twice daily and bring log to clinic visit.  Breast binder or bra as desired.  Ok to raise arms above shoulders for bathing and dressing.  No house yard work or exercise until cleared by MD.   Driving Restrictions   Complete by: As directed    No driving if taking narcotics   Lifting restrictions   Complete by: As directed    No lifting > 10 lbs through follow up visit.   Resume previous diet   Complete by: As directed      Allergies as of 01/22/2021      Reactions   Fentanyl Itching   Severe itching - Patient refuses this medication   Peach Flavor Anaphylaxis   Penicillins Hives   AS A CHILD   Sulfa Antibiotics Nausea And Vomiting   Sulfasalazine Nausea And Vomiting, Other (See Comments)   Latex Itching      Medication List    STOP taking these medications   linezolid 600 MG tablet Commonly known as: ZYVOX     TAKE these medications   CALCIUM 600 PO Take 1 capsule by mouth daily.   denosumab 60 MG/ML Sosy injection Commonly known as: PROLIA Inject 60 mg into the skin every 6 (six) months.   exemestane 25 MG tablet Commonly known as: AROMASIN Take 1 tablet (25 mg total) by mouth daily after breakfast.   Multivitamin Women 50+ Tabs Take 1 tablet by mouth daily.   ondansetron 4 MG disintegrating tablet Commonly known as: ZOFRAN-ODT Take 1 tablet (4 mg total) by mouth every 8 (eight) hours as needed for nausea or vomiting.   oxyCODONE 5 MG immediate release tablet Commonly known as: Oxy  IR/ROXICODONE Take 1 tablet (5 mg total) by mouth every 6 (six) hours as needed for moderate pain.   Repatha SureClick 568 MG/ML Soaj Generic drug: Evolocumab INJECT 1 PEN INTO THE SKIN EVERY 14 DAYS. What changed: See the new instructions.   venlafaxine XR 37.5 MG 24 hr capsule Commonly known as: EFFEXOR-XR TAKE 1 CAPSULE BY MOUTH DAILY WITH BREAKFAST       Follow-up  Information    Irene Limbo, MD On 01/26/2021.   Specialty: Plastic Surgery Why: 815 am Contact information: [address] [place] [address] [phone]               Signed: Irene Limbo 01/22/2021, 12:10 PM

## 2021-01-23 ENCOUNTER — Other Ambulatory Visit: Payer: Self-pay | Admitting: *Deleted

## 2021-01-23 ENCOUNTER — Telehealth: Payer: Self-pay

## 2021-01-23 NOTE — Telephone Encounter (Signed)
Noted there was no PCP follow-up planned at this time-happy to see her if needed

## 2021-01-23 NOTE — Telephone Encounter (Cosign Needed)
Transition Care Management Follow-up Telephone Call  Date of discharge and from where: [facility] 01/22/21  How have you been since you were released from the hospital? ok  Any questions or concerns? No  Items Reviewed:  Did the pt receive and understand the discharge instructions provided? Yes   Medications obtained and verified? Yes   Other? No   Any new allergies since your discharge? No   Dietary orders reviewed? Yes  Do you have support at home? Yes   Home Care and Equipment/Supplies: Were home health services ordered? not applicable If so, what is the name of the agency?   Has the agency set up a time to come to the patient's home? not applicable Were any new equipment or medical supplies ordered?  No What is the name of the medical supply agency?  Were you able to get the supplies/equipment? not applicable Do you have any questions related to the use of the equipment or supplies? No  Functional Questionnaire: (I = Independent and D = Dependent) ADLs: I  Bathing/Dressing- I  Meal Prep- I  Eating- I  Maintaining continence- I  Transferring/Ambulation- I  Managing Meds- I  Follow up appointments reviewed:   PCP Hospital f/u appt confirmed? No    Specialist Hospital f/u appt confirmed? Yes  Scheduled to see Dr Lenis Dickinson on 01/26/21 @ 8:15.  Are transportation arrangements needed? No   If their condition worsens, is the pt aware to call PCP or go to the Emergency Dept.? Yes  Was the patient provided with contact information for the PCP's office or ED? Yes  Was to pt encouraged to call back with questions or concerns? Yes

## 2021-01-23 NOTE — Patient Outreach (Signed)
[facility]) Care Management  01/23/2021  [name] [birth year] [id number]   Transition of care telephone call  Referral received:01/22/21 Initial outreach:01/23/21 Insurance: [facility]   Initial unsuccessful telephone call to patient's preferred number in order to complete transition of care assessment; no answer, left HIPAA compliant voicemail message requesting return call.   Objective: Per the electronic medical record,[name] was hospitalized at [facility] for Recurrent cellulitis right chest for planned  Removal of Right Breast implant  Comorbidities include: Right breast cancer s/p surgery 2018, bilateral mastectomy and breast implants.  She  was discharged to home on 01/22/21  without the need for home health services or DME.    Plan: This RNCM will route unsuccessful outreach letter with [place] Management pamphlet and 24 hour Nurse Advice Line Magnet to [facility] Management clinical pool to be mailed to patient's home address. This RNCM will attempt another outreach within 4 business days.  Joylene Draft, RN, BSN  [place] Management Coordinator  [phone]- Mobile [phone]- Toll Free Main Office

## 2021-01-25 LAB — AEROBIC/ANAEROBIC CULTURE W GRAM STAIN (SURGICAL/DEEP WOUND)

## 2021-01-25 LAB — CULTURE, BLOOD (SINGLE)
MICRO NUMBER:: 11715921
MICRO NUMBER:: 11715924
Result:: NO GROWTH
SPECIMEN QUALITY:: ADEQUATE

## 2021-01-25 LAB — C-REACTIVE PROTEIN: CRP: 378.3 mg/L — ABNORMAL HIGH (ref <8.0)

## 2021-01-25 LAB — SEDIMENTATION RATE: Sed Rate: 70 mm/h — ABNORMAL HIGH (ref 0–20)

## 2021-01-27 ENCOUNTER — Other Ambulatory Visit: Payer: Self-pay | Admitting: *Deleted

## 2021-01-27 NOTE — Patient Outreach (Signed)
[facility]) Care Management  01/27/2021  Katelyn Shelton 1976-07-29 [id number]   Transition of care call Referral received: 01/22/21 Initial outreach attempt: 01/23/21 Insurance: [employer]    2nd unsuccessful telephone call to patient's preferred contact number in order to complete post hospital discharge transition of care assessment , no answer left HIPAA compliant message requesting return call.    Objective: Per the electronic medical record,[name] hospitalized [facility] for Recurrent cellulitis right chest for planned  Removal of Right Breast implant  Comorbidities include: Right breast cancer s/p surgery 2018, bilateral mastectomy and breast implants.  She  was discharged to home on 01/22/21  without the need for home health servicesor DME.  Plan If no return call from patient will attempt 3rd outreach in the next 4 business days.    Joylene Draft, RN, BSN  [place] Management Coordinator  [phone]- Mobile [phone]- Toll Free Main Office

## 2021-01-28 ENCOUNTER — Other Ambulatory Visit: Payer: Self-pay

## 2021-01-28 ENCOUNTER — Encounter: Payer: Self-pay | Admitting: Infectious Disease

## 2021-01-28 ENCOUNTER — Other Ambulatory Visit (HOSPITAL_COMMUNITY): Payer: Self-pay

## 2021-01-28 ENCOUNTER — Ambulatory Visit (INDEPENDENT_AMBULATORY_CARE_PROVIDER_SITE_OTHER): Payer: 59 | Admitting: Infectious Disease

## 2021-01-28 VITALS — BP 108/67 | HR 109 | Temp 98.1°F

## 2021-01-28 DIAGNOSIS — T8579XD Infection and inflammatory reaction due to other internal prosthetic devices, implants and grafts, subsequent encounter: Secondary | ICD-10-CM

## 2021-01-28 DIAGNOSIS — N61 Mastitis without abscess: Secondary | ICD-10-CM

## 2021-01-28 DIAGNOSIS — Z170 Estrogen receptor positive status [ER+]: Secondary | ICD-10-CM | POA: Diagnosis not present

## 2021-01-28 DIAGNOSIS — A4902 Methicillin resistant Staphylococcus aureus infection, unspecified site: Secondary | ICD-10-CM | POA: Diagnosis not present

## 2021-01-28 DIAGNOSIS — C50411 Malignant neoplasm of upper-outer quadrant of right female breast: Secondary | ICD-10-CM | POA: Diagnosis not present

## 2021-01-28 HISTORY — DX: Methicillin resistant Staphylococcus aureus infection, unspecified site: A49.02

## 2021-01-28 MED ORDER — DOXYCYCLINE HYCLATE 100 MG PO TABS
100.0000 mg | ORAL_TABLET | Freq: Two times a day (BID) | ORAL | 2 refills | Status: DC
Start: 2021-01-28 — End: 2021-07-08
  Filled 2021-01-28: qty 60, 30d supply, fill #0

## 2021-01-28 NOTE — Progress Notes (Signed)
Subjective:  Chief Complaints: Concerns about residual areas of erythema in right breast  Patient ID: Katelyn Shelton, female    DOB: 06-Apr-1976, 45 y.o.   MRN: [id number]  HPI  Katelyn Shelton is a 45 year old Caucasian female who had bilateral mastectomies for breast cancer followed by implants that were placed by [name] here in [place].  A little bit over a year ago she was seen in my office by my partner Dr. Johnnye Sima when the patient had acute onset then of erythema around the breast.  That time she had been started on ciprofloxacin by plastic surgery.  On 10 February.  She then seen by my partner Dr. Johnnye Sima on the 11th and given 24 hours of Zyvox followed by single dose of oritavancin on 12 February.  She still has some residual erythema on 17 February was given a repeat dose of oritavancin.  She then appeared to be doing  well but then began complaining again of some area around the breast that was erythematous and doxycycline was called in which I believe she actually took for more than the 13 days but rather has been something is to be as she was on for multiple months.  She saw Dr. Donne Hazel with general surgery I told her that if she came off the doxycycline and had recurrence of cellulitis that she would need to have her implant removed.  In the interim she is also seen Dr. Abelino Derrick at [facility].  Did not have recurrence of infection immediately after coming off the doxycycline but that was only a few months ago that she stopped it.  She said that she has had ongoing discomfort and pain in that breast even when not being treated for active infection.  She was coming back from traveling out of the city by air and had to overhead to get some baggage and had some pain in her arm and armpit.   The interim she then developed a rash below the breast that had what her father described as being a petechial appearance. (he is also a physician).  In the interim the erythema  worsened but more troublesome she began to have more systemic symptoms of malaise weakness anorexia nausea and vomiting.  Her husband Dr. [name] reached out to my partner Dr. Johnnye Sima and started zyvox  although she has had only 2 doses so far and continues to have trouble with nausea and vomiting.  I saw Katelyn Shelton on March 28th and offered direct admission then vs 29th.  We were able to help her nausea with zofram IM and dissolvable zofran that she has been taking.  I have corresponded with [name] who told me she could take out implant yesterday vs today.  I also spoke with Dr. Donne Hazel who told me he could facilitate admission today rather than have her go to hospitalist service.   Overnight the zofran controlled her nausea but she had one fever over 101.8.   She continued to hurt all over and looks systemically ill.  We had her electively admitted to the hospital and she had surgery with [name] with removal of the implant that night.  She was seen by my partner Dr. West Bali on the inpatient consult service.  We used daptomycin  Initially. Cutlures from OR yielded MRSA.B and fungal cultures were also taken. Ultimately patient was given long-acting Oritavancin day of discharge.  She still has an area of residual erythema she is concerned about infection still potentially being there.  Past Medical History:  Diagnosis Date  . Allergy   . Anxiety    venflafaxine XR 75 mg  . Breast cancer ([place]) 01/2017   right; genetic testing negative in 2018 with [employer] panel  . Heart murmur    as child- went away  . History of MRSA infection    elbow  . Hyperlipidemia   . Infection of breast implant ([place]) 01/19/2021  . Joint pain    due to medicine induced joint pain  . Migraine    otc med prn  . Miscarriage    x1  . Nausea and vomiting 01/19/2021  . Neuromuscular disorder ([place])    neuropathy bilater feet - ? 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 Osteopenia   . UTI (urinary tract infection)      Past Surgical History:  Procedure Laterality Date  . AREOLA/NIPPLE RECONSTRUCTION WITH GRAFT Right 12/01/2018   Procedure: RIGHT NIPPLE AREOLA COMPLEX RECONSTRUCTION WITH FULL THICKNESS SKIN GRAFT FROM RIGHT GROIN;  Surgeon: Irene Limbo, MD;  Location: [place];  Service: Plastics;  Laterality: Right;  . BREAST IMPLANT REMOVAL Right 01/20/2021   Procedure: REMOVAL RIGHT BREAST IMPLANT;  Surgeon: Irene Limbo, MD;  Location: [place];  Service: Plastics;  Laterality: Right;  . BREAST RECONSTRUCTION Left 12/01/2018   Procedure: LEFT BREAST REVISION RECONSTRUCTION WITH SKIN EXCISION;  Surgeon: Irene Limbo, MD;  Location: [place];  Service: Plastics;  Laterality: Left;  . BREAST RECONSTRUCTION Left 12/01/2018   Procedure: COMPOSITE GRAFT FROM LEFT NIPPLE (NIPPLE SHARING);  Surgeon: Irene Limbo, MD;  Location: [place];  Service: Plastics;  Laterality: Left;  . BREAST RECONSTRUCTION WITH PLACEMENT OF TISSUE EXPANDER AND FLEX HD (ACELLULAR HYDRATED DERMIS) Bilateral 02/28/2017   Procedure: BILATERAL BREAST RECONSTRUCTION WITH PLACEMENT OF TISSUE EXPANDER AND ALLODERM;  Surgeon: Irene Limbo, MD;  Location: [place];  Service: Plastics;  Laterality: Bilateral;  . CESAREAN SECTION  2008,2010,2013   x 3  . CESAREAN SECTION  02/04/2012   Procedure: CESAREAN SECTION;  Surgeon: Cyril Mourning, MD;  Location: [facility];  Service: Gynecology;  Laterality: N/A;  Repeat Cesarean Section Delivery  Boy  @  [phone], Apgars 9/10  . COLONOSCOPY     polyp  . DILATION AND CURETTAGE OF UTERUS    . EXCISION OF BREAST LESION Right 03/15/2017   Procedure: RIGHT NIPPLE AND AREOLA EXCISION;  Surgeon: Rolm Bookbinder, MD;  Location: [place];  Service: General;  Laterality: Right;  . LAPAROSCOPIC VAGINAL HYSTERECTOMY WITH SALPINGO OOPHORECTOMY Bilateral 06/29/2018   Procedure: LAPAROSCOPIC ASSISTED VAGINAL HYSTERECTOMY  WITH SALPINGO OOPHORECTOMY;  Surgeon: Louretta Shorten, MD;  Location: [facility];  Service: Gynecology;  Laterality: Bilateral;  Request case for [facility] CRNA  . LIPOSUCTION WITH LIPOFILLING N/A 07/25/2018   Procedure: LIPOSUCTION WITH LIPOFILLING FROM ABDOMEN TO CHEST;  Surgeon: Irene Limbo, MD;  Location: [place];  Service: Plastics;  Laterality: N/A;  . LIPOSUCTION WITH LIPOFILLING Bilateral 12/01/2018   Procedure: LIPOFILLING TO BILATERAL CHEST REVISION;  Surgeon: Irene Limbo, MD;  Location: [place];  Service: Plastics;  Laterality: Bilateral;  . NIPPLE SPARING MASTECTOMY Bilateral 02/28/2017   Procedure: RIGHT NIPPLE SPARING MASTECTOMY; LEFT PROPHYLACTIC NIPPLE SPARING MASTECTOMY;  Surgeon: Rolm Bookbinder, MD;  Location: [place];  Service: General;  Laterality: Bilateral;  . PECTUS EXCAVATUM REPAIR  4120   45 years old- very large implant  . REMOVAL OF TISSUE EXPANDER AND PLACEMENT OF IMPLANT Bilateral 07/25/2018   Procedure: REMOVAL  OF TISSUE EXPANDER AND PLACEMENT OF SILICONE IMPLANT;  Surgeon: Irene Limbo, MD;  Location: [place];  Service: Plastics;  Laterality: Bilateral;  . SENTINEL NODE BIOPSY Right 02/28/2017   Procedure: RIGHT AXILLARY SENTINEL LYMPH  NODE BIOPSY;  Surgeon: Rolm Bookbinder, MD;  Location: [place];  Service: General;  Laterality: Right;  . VULVAR LESION REMOVAL    . WISDOM TOOTH EXTRACTION      Family History  Problem Relation Age of Onset  . Skin cancer Mother   . Hyperlipidemia Mother   . Hypertension Mother   . Colon polyps Mother   . Hyperlipidemia Father   . Parkinson's disease Sister 60       early on set  . Miscarriages / Stillbirths Sister   . Diabetes Maternal Aunt   . Colon polyps Maternal Uncle   . Heart failure Maternal Grandmother   . Stroke Maternal Grandmother   . Heart attack Maternal Grandfather   . Depression Maternal Grandfather   . Early  death Maternal Grandfather        39 MI  . Stomach cancer Paternal Grandmother   . Prostate cancer Paternal Grandfather   . Alzheimer's disease Paternal Grandfather   . Breast cancer Cousin        mother's paternal first FEMALE cousin  . Colon polyps Sister   . Colon cancer Neg Hx   . Rectal cancer Neg Hx       Social History   Socioeconomic History  . Marital status: Married    Spouse name: [name]  . Number of children: Not on file  . Years of education: Not on file  . Highest education level: Not on file  Occupational History  . Not on file  Tobacco Use  . Smoking status: Never Smoker  . Smokeless tobacco: Never Used  Vaping Use  . Vaping Use: Never used  Substance and Sexual Activity  . Alcohol use: Yes    Alcohol/week: 1.0 - 2.0 standard drink    Types: 1 - 2 Standard drinks or equivalent per week    Comment: occasional wine - twice monthly  . Drug use: No  . Sexual activity: Yes    Birth control/protection: Surgical  Other Topics Concern  . Not on file  Social History Narrative   Married to Dr. Aundra Dubin. 3 children.    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    Masters in public history at [employer]. Undergrad at [employer]: running, reading, exercise   Social Determinants of Health   Financial Resource Strain: Not on file  Food Insecurity: Not on file  Transportation Needs: Not on file  Physical Activity: Not on file  Stress: Not on file  Social Connections: Not on file    Allergies  Allergen Reactions  . Fentanyl Itching    Severe itching - Patient refuses this medication  . Peach Flavor Anaphylaxis  . Penicillins Hives    AS A CHILD  . Sulfa Antibiotics Nausea And Vomiting  . Sulfasalazine Nausea And Vomiting and Other (See Comments)  . Latex Itching     Current Outpatient Medications:  .  Calcium Carbonate (CALCIUM 600 PO), Take 1 capsule by mouth daily., Disp: , Rfl:  .  denosumab (PROLIA) 60 MG/ML  SOSY injection, Inject 60 mg into the skin every 6 (six) months., Disp: , Rfl:  .  Evolocumab 140 MG/ML SOAJ, INJECT  1 PEN INTO THE SKIN EVERY 14 DAYS. (Patient taking differently: Inject 140 mg into the muscle every 14 (fourteen) days.), Disp: 2 mL, Rfl: 11 .  exemestane (AROMASIN) 25 MG tablet, TAKE 1 TABLET (25 MG TOTAL) BY MOUTH DAILY AFTER BREAKFAST., Disp: 90 tablet, Rfl: 3 .  Multiple Vitamins-Minerals (MULTIVITAMIN WOMEN 50+) TABS, Take 1 tablet by mouth daily., Disp: , Rfl:  .  venlafaxine XR (EFFEXOR-XR) 37.5 MG 24 hr capsule, TAKE 1 CAPSULE BY MOUTH DAILY WITH BREAKFAST, Disp: 90 capsule, Rfl: 4 .  ondansetron (ZOFRAN-ODT) 4 MG disintegrating tablet, TAKE 1 TABLET (4 MG TOTAL) BY MOUTH EVERY 8 (EIGHT) HOURS AS NEEDED FOR NAUSEA OR VOMITING. (Patient not taking: Reported on 01/28/2021), Disp: 60 tablet, Rfl: 0 .  oxyCODONE (OXY IR/ROXICODONE) 5 MG immediate release tablet, Take 1 tablet (5 mg total) by mouth every 6 (six) hours as needed for moderate pain. (Patient not taking: Reported on 01/28/2021), Disp: 10 tablet, Rfl: 0 .  oxyCODONE (OXY IR/ROXICODONE) 5 MG immediate release tablet, TAKE 1 TABLET (5 MG TOTAL) BY MOUTH EVERY SIX HOURS AS NEEDED FOR MODERATE PAIN. (Patient not taking: Reported on 01/28/2021), Disp: 10 tablet, Rfl: 0  Review of Systems  Constitutional: Negative for activity change, appetite change, chills, diaphoresis, fatigue, fever and unexpected weight change.  HENT: Negative for congestion, rhinorrhea, sinus pressure, sneezing, sore throat and trouble swallowing.   Eyes: Negative for photophobia and visual disturbance.  Respiratory: Negative for cough, chest tightness, shortness of breath, wheezing and stridor.   Cardiovascular: Negative for chest pain, palpitations and leg swelling.  Gastrointestinal: Negative for abdominal distention, abdominal pain, anal bleeding, blood in stool, constipation, diarrhea and nausea.  Genitourinary: Negative for difficulty urinating,  dysuria, flank pain and hematuria.  Musculoskeletal: Negative for arthralgias, back pain, gait problem, joint swelling and myalgias.  Skin: Positive for color change. Negative for pallor, rash and wound.  Neurological: Negative for dizziness, tremors, weakness and light-headedness.  Hematological: Negative for adenopathy. Does not bruise/bleed easily.  Psychiatric/Behavioral: Negative for agitation, behavioral problems, confusion, decreased concentration, dysphoric mood and sleep disturbance.       Objective:   Physical Exam Vitals and nursing note reviewed. Exam conducted with a chaperone present.  Constitutional:      General: She is not in acute distress.    Appearance: Normal appearance. She is not diaphoretic.  HENT:     Head: Normocephalic and atraumatic.     Right Ear: External ear normal.     Left Ear: External ear normal.     Nose: Nose normal.     Mouth/Throat:     Pharynx: No oropharyngeal exudate.  Eyes:     General: No scleral icterus.    Extraocular Movements: Extraocular movements intact.     Conjunctiva/sclera: Conjunctivae normal.  Cardiovascular:     Rate and Rhythm: Normal rate and regular rhythm.     Heart sounds: Normal heart sounds. No murmur heard. No friction rub. No gallop.   Pulmonary:     Effort: Pulmonary effort is normal. No respiratory distress.     Breath sounds: No stridor. No wheezing or rales.  Chest:    Abdominal:     General: Bowel sounds are normal. There is no distension.     Palpations: Abdomen is soft.     Tenderness: There is no abdominal tenderness.  Musculoskeletal:        General: No tenderness.     Right shoulder: Decreased range of motion.     Cervical back: Normal range  of motion and neck supple.  Lymphadenopathy:     Cervical: No cervical adenopathy.  Skin:    General: Skin is warm and dry.     Coloration: Skin is not pale.     Findings: No erythema or rash.  Neurological:     General: No focal deficit present.      Mental Status: She is alert and oriented to person, place, and time.     Coordination: Coordination normal.  Psychiatric:        Attention and Perception: Attention normal.        Mood and Affect: Mood normal. Mood is not anxious.        Speech: Speech normal.        Behavior: Behavior is cooperative.        Thought Content: Thought content normal.        Cognition and Memory: Cognition and memory normal.        Judgment: Judgment normal.    Breast on January 19, 2021:       March 29th, 2022:      Today April 6th, 2022 after implant removal:          Assessment & Plan:   Breast implant infection:  She has had successful removal of the implant and MRSA isolated  If we have had successful source control we should be able to cure this infection.  We will start doxycyline 100mg  bid and I have sent a month supply (may indeed be overkill but given all of the surgeries she has gone through and stress I think better to err on side of longer course of oral therapy  We will have her see Janene Madeira next week in the clinic and I have also scheduled with her with me in May.  We will consider some decolonization regimens for her if she so wishes including Hibiclens showers with intranasal mupirocin for her and her husband if he is also wanting to do this.  Another more aggressive option for decolonization would be bleach baths.  We can address this down the road  Remaining breast implant: This is going to be removed electively I would recommend that she get preoperative daptomycin or vancomycin to cover for MRSA  Depression: She hadbeen taken off her antidepressant medications to avoid serotonin syndrome in the context of Zyvox but is now back on them.  Breast cancer: She has been cancer free and recent MRI showed no evidence of cancer she is also seen Dr. Jana Hakim on Friday  I spent greater than 30 minutes with the patient including greater than 50% of time in face to face  counsel of the patient guarding the nature of MRSA infections implant associated infections MRSA colonization and in coordination of her care.

## 2021-01-29 ENCOUNTER — Ambulatory Visit (INDEPENDENT_AMBULATORY_CARE_PROVIDER_SITE_OTHER): Payer: 59 | Admitting: Infectious Disease

## 2021-01-29 ENCOUNTER — Other Ambulatory Visit (HOSPITAL_COMMUNITY): Payer: Self-pay

## 2021-01-29 VITALS — BP 97/66 | HR 79 | Temp 98.0°F

## 2021-01-29 DIAGNOSIS — F419 Anxiety disorder, unspecified: Secondary | ICD-10-CM | POA: Diagnosis not present

## 2021-01-29 DIAGNOSIS — A4902 Methicillin resistant Staphylococcus aureus infection, unspecified site: Secondary | ICD-10-CM | POA: Diagnosis not present

## 2021-01-29 DIAGNOSIS — L299 Pruritus, unspecified: Secondary | ICD-10-CM

## 2021-01-29 DIAGNOSIS — T8579XD Infection and inflammatory reaction due to other internal prosthetic devices, implants and grafts, subsequent encounter: Secondary | ICD-10-CM

## 2021-01-29 DIAGNOSIS — F410 Panic disorder [episodic paroxysmal anxiety] without agoraphobia: Secondary | ICD-10-CM

## 2021-01-29 DIAGNOSIS — N61 Mastitis without abscess: Secondary | ICD-10-CM

## 2021-01-29 LAB — ACID FAST SMEAR (AFB, MYCOBACTERIA): Acid Fast Smear: NEGATIVE

## 2021-01-29 MED FILL — Evolocumab Subcutaneous Soln Auto-Injector 140 MG/ML: SUBCUTANEOUS | 28 days supply | Qty: 2 | Fill #0 | Status: AC

## 2021-01-29 NOTE — Progress Notes (Signed)
Subjective:  Chief Complaints: Concerns about streaking erythema across to her chest last night and then intense itching in her hands and feet   Patient ID: Katelyn Shelton, female    DOB: 03/06/1976, 45 y.o.   MRN: [id number]  HPI  Katelyn Shelton is a 45 year old Caucasian female who had bilateral mastectomies for breast cancer followed by implants that were placed by [name] here in [place].  A little bit over a year ago she was seen in my office by my partner Dr. Johnnye Sima when the patient had acute onset then of erythema around the breast.  That time she had been started on ciprofloxacin by plastic surgery.  On 10 February.  She then seen by my partner Dr. Johnnye Sima on the 11th and given 24 hours of Zyvox followed by single dose of oritavancin on 12 February.  She still has some residual erythema on 17 February was given a repeat dose of oritavancin.  She then appeared to be doing  well but then began complaining again of some area around the breast that was erythematous and doxycycline was called in which I believe she actually took for more than the 13 days but rather has been something is to be as she was on for multiple months.  She saw Dr. Donne Hazel with general surgery I told her that if she came off the doxycycline and had recurrence of cellulitis that she would need to have her implant removed.  In the interim she is also seen Dr. Abelino Derrick at [facility].  Did not have recurrence of infection immediately after coming off the doxycycline but that was only a few months ago that she stopped it.  She said that she has had ongoing discomfort and pain in that breast even when not being treated for active infection.  She was coming back from traveling out of the city by air and had to overhead to get some baggage and had some pain in her arm and armpit.   The interim she then developed a rash below the breast that had what her father described as being a petechial appearance.  (he is also a physician).  In the interim the erythema worsened but more troublesome she began to have more systemic symptoms of malaise weakness anorexia nausea and vomiting.  Her husband Dr. [name] reached out to my partner Dr. Johnnye Sima and started zyvox  although she has had only 2 doses so far and continues to have trouble with nausea and vomiting.  I saw Katelyn Shelton on March 28th and offered direct admission then vs 29th.  We were able to help her nausea with zofram IM and dissolvable zofran that she has been taking.  I have corresponded with [name] who told me she could take out implant yesterday vs today.  I also spoke with Dr. Donne Hazel who told me he could facilitate admission today rather than have her go to hospitalist service.   Overnight the zofran controlled her nausea but she had one fever over 101.8.   She continued to hurt all over and looks systemically ill.  We had her electively admitted to the hospital and she had surgery with [name] with removal of the implant that night.  She was seen by my partner Dr. West Bali on the inpatient consult service.  We used daptomycin  Initially. Cutlures from OR yielded MRSA.B and fungal cultures were also taken. Ultimately patient was given long-acting Oritavancin day of discharge.   I saw her yesterday in clinic and  she still has an area of residual erythema she is concerned about infection still potentially being there.  I started on doxycycline 100 mg twice daily.  Last night late at night she started noticing that there is some erythema streaking across her chest.  She was worried that the infection was potentially spreading she also developed developed intense pruritus in her hands and feet and was concerned that she might be having allergic reaction to the doxycycline she also had a panic attack in the midst of this.  She took Benadryl and was fell asleep but then was quite somnolent this morning difficulty getting out of  bed.  Husband [name] called me this morning about her and asked if we could see her before the weekend and we worked her into clinic this morning.  I can appreciate some faint erythema in the area she is describing but it is not terribly dramatic.  Does not have systemic symptoms of nausea fevers chills as she did when her breast implant infection was clearly infected and she had SIRS and I was worried about impending sepsis.         Past Medical History:  Diagnosis Date  . Allergy   . Anxiety    venflafaxine XR 75 mg  . Breast cancer ([place]) 01/2017   right; genetic testing negative in 2018 with [facility] panel  . Heart murmur    as child- went away  . History of MRSA infection    elbow  . Hyperlipidemia   . Infection of breast implant ([place]) 01/19/2021  . Joint pain    due to medicine induced joint pain  . Migraine    otc med prn  . Miscarriage    x1  . MRSA infection 01/28/2021  . Nausea and vomiting 01/19/2021  . Neuromuscular disorder ([place])    neuropathy bilater feet - ? 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 Osteopenia   . UTI (urinary tract infection)     Past Surgical History:  Procedure Laterality Date  . AREOLA/NIPPLE RECONSTRUCTION WITH GRAFT Right 12/01/2018   Procedure: RIGHT NIPPLE AREOLA COMPLEX RECONSTRUCTION WITH FULL THICKNESS SKIN GRAFT FROM RIGHT GROIN;  Surgeon: Irene Limbo, MD;  Location: [place];  Service: Plastics;  Laterality: Right;  . BREAST IMPLANT REMOVAL Right 01/20/2021   Procedure: REMOVAL RIGHT BREAST IMPLANT;  Surgeon: Irene Limbo, MD;  Location: [place];  Service: Plastics;  Laterality: Right;  . BREAST RECONSTRUCTION Left 12/01/2018   Procedure: LEFT BREAST REVISION RECONSTRUCTION WITH SKIN EXCISION;  Surgeon: Irene Limbo, MD;  Location: [place];  Service: Plastics;  Laterality: Left;  . BREAST RECONSTRUCTION Left 12/01/2018   Procedure: COMPOSITE GRAFT FROM LEFT NIPPLE (NIPPLE SHARING);  Surgeon: Irene Limbo, MD;   Location: [place];  Service: Plastics;  Laterality: Left;  . BREAST RECONSTRUCTION WITH PLACEMENT OF TISSUE EXPANDER AND FLEX HD (ACELLULAR HYDRATED DERMIS) Bilateral 02/28/2017   Procedure: BILATERAL BREAST RECONSTRUCTION WITH PLACEMENT OF TISSUE EXPANDER AND ALLODERM;  Surgeon: Irene Limbo, MD;  Location: [place];  Service: Plastics;  Laterality: Bilateral;  . CESAREAN SECTION  2008,2010,2013   x 3  . CESAREAN SECTION  02/04/2012   Procedure: CESAREAN SECTION;  Surgeon: Cyril Mourning, MD;  Location: [facility];  Service: Gynecology;  Laterality: N/A;  Repeat Cesarean Section Delivery  Boy  @  [phone], Apgars 9/10  . COLONOSCOPY     polyp  . DILATION AND CURETTAGE OF UTERUS    . EXCISION OF BREAST LESION Right  03/15/2017   Procedure: RIGHT NIPPLE AND AREOLA EXCISION;  Surgeon: Rolm Bookbinder, MD;  Location: [place];  Service: General;  Laterality: Right;  . LAPAROSCOPIC VAGINAL HYSTERECTOMY WITH SALPINGO OOPHORECTOMY Bilateral 06/29/2018   Procedure: LAPAROSCOPIC ASSISTED VAGINAL HYSTERECTOMY WITH SALPINGO OOPHORECTOMY;  Surgeon: Louretta Shorten, MD;  Location: [facility];  Service: Gynecology;  Laterality: Bilateral;  Request case for [facility] CRNA  . LIPOSUCTION WITH LIPOFILLING N/A 07/25/2018   Procedure: LIPOSUCTION WITH LIPOFILLING FROM ABDOMEN TO CHEST;  Surgeon: Irene Limbo, MD;  Location: [place];  Service: Plastics;  Laterality: N/A;  . LIPOSUCTION WITH LIPOFILLING Bilateral 12/01/2018   Procedure: LIPOFILLING TO BILATERAL CHEST REVISION;  Surgeon: Irene Limbo, MD;  Location: [place];  Service: Plastics;  Laterality: Bilateral;  . NIPPLE SPARING MASTECTOMY Bilateral 02/28/2017   Procedure: RIGHT NIPPLE SPARING MASTECTOMY; LEFT PROPHYLACTIC NIPPLE SPARING MASTECTOMY;  Surgeon: Rolm Bookbinder, MD;  Location: [place];  Service: General;  Laterality: Bilateral;  . PECTUS EXCAVATUM  REPAIR  [id number]   45 years old- very large implant  . REMOVAL OF TISSUE EXPANDER AND PLACEMENT OF IMPLANT Bilateral 07/25/2018   Procedure: REMOVAL OF TISSUE EXPANDER AND PLACEMENT OF SILICONE IMPLANT;  Surgeon: Irene Limbo, MD;  Location: [place];  Service: Plastics;  Laterality: Bilateral;  . SENTINEL NODE BIOPSY Right 02/28/2017   Procedure: RIGHT AXILLARY SENTINEL LYMPH  NODE BIOPSY;  Surgeon: Rolm Bookbinder, MD;  Location: [place];  Service: General;  Laterality: Right;  . VULVAR LESION REMOVAL    . WISDOM TOOTH EXTRACTION      Family History  Problem Relation Age of Onset  . Skin cancer Mother   . Hyperlipidemia Mother   . Hypertension Mother   . Colon polyps Mother   . Hyperlipidemia Father   . Parkinson's disease Sister 11       early on set  . Miscarriages / Stillbirths Sister   . Diabetes Maternal Aunt   . Colon polyps Maternal Uncle   . Heart failure Maternal Grandmother   . Stroke Maternal Grandmother   . Heart attack Maternal Grandfather   . Depression Maternal Grandfather   . Early death Maternal Grandfather        66 MI  . Stomach cancer Paternal Grandmother   . Prostate cancer Paternal Grandfather   . Alzheimer's disease Paternal Grandfather   . Breast cancer Cousin        mother's paternal first FEMALE cousin  . Colon polyps Sister   . Colon cancer Neg Hx   . Rectal cancer Neg Hx       Social History   Socioeconomic History  . Marital status: Married    Spouse name: [name]  . Number of children: Not on file  . Years of education: Not on file  . Highest education level: Not on file  Occupational History  . Not on file  Tobacco Use  . Smoking status: Never Smoker  . Smokeless tobacco: Never Used  Vaping Use  . Vaping Use: Never used  Substance and Sexual Activity  . Alcohol use: Yes    Alcohol/week: 1.0 - 2.0 standard drink    Types: 1 - 2 Standard drinks or equivalent per week    Comment: occasional wine -  twice monthly  . Drug use: No  . Sexual activity: Yes    Birth control/protection: Surgical  Other Topics Concern  . Not on file  Social History Narrative   Married to Dr.  Winborne. 3 children.    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    Masters in public history at [employer]. Undergrad at [employer]: running, reading, exercise   Social Determinants of Health   Financial Resource Strain: Not on file  Food Insecurity: Not on file  Transportation Needs: Not on file  Physical Activity: Not on file  Stress: Not on file  Social Connections: Not on file    Allergies  Allergen Reactions  . Fentanyl Itching    Severe itching - Patient refuses this medication  . Peach Flavor Anaphylaxis  . Penicillins Hives    AS A CHILD  . Sulfa Antibiotics Nausea And Vomiting  . Sulfasalazine Nausea And Vomiting and Other (See Comments)  . Latex Itching     Current Outpatient Medications:  .  Calcium Carbonate (CALCIUM 600 PO), Take 1 capsule by mouth daily., Disp: , Rfl:  .  denosumab (PROLIA) 60 MG/ML SOSY injection, Inject 60 mg into the skin every 6 (six) months., Disp: , Rfl:  .  doxycycline (VIBRA-TABS) 100 MG tablet, Take 1 tablet (100 mg total) by mouth 2 (two) times daily., Disp: 60 tablet, Rfl: 2 .  Evolocumab 140 MG/ML SOAJ, INJECT 1 PEN INTO THE SKIN EVERY 14 DAYS. (Patient taking differently: Inject 140 mg into the muscle every 14 (fourteen) days.), Disp: 2 mL, Rfl: 11 .  exemestane (AROMASIN) 25 MG tablet, TAKE 1 TABLET (25 MG TOTAL) BY MOUTH DAILY AFTER BREAKFAST., Disp: 90 tablet, Rfl: 3 .  Multiple Vitamins-Minerals (MULTIVITAMIN WOMEN 50+) TABS, Take 1 tablet by mouth daily., Disp: , Rfl:  .  ondansetron (ZOFRAN-ODT) 4 MG disintegrating tablet, TAKE 1 TABLET (4 MG TOTAL) BY MOUTH EVERY 8 (EIGHT) HOURS AS NEEDED FOR NAUSEA OR VOMITING. (Patient not taking: Reported on 01/28/2021), Disp: 60 tablet, Rfl: 0 .  oxyCODONE (OXY IR/ROXICODONE) 5  MG immediate release tablet, Take 1 tablet (5 mg total) by mouth every 6 (six) hours as needed for moderate pain. (Patient not taking: Reported on 01/28/2021), Disp: 10 tablet, Rfl: 0 .  oxyCODONE (OXY IR/ROXICODONE) 5 MG immediate release tablet, TAKE 1 TABLET (5 MG TOTAL) BY MOUTH EVERY SIX HOURS AS NEEDED FOR MODERATE PAIN. (Patient not taking: Reported on 01/28/2021), Disp: 10 tablet, Rfl: 0 .  venlafaxine XR (EFFEXOR-XR) 37.5 MG 24 hr capsule, TAKE 1 CAPSULE BY MOUTH DAILY WITH BREAKFAST, Disp: 90 capsule, Rfl: 4  Review of Systems  Constitutional: Negative for activity change, appetite change, chills, diaphoresis, fatigue, fever and unexpected weight change.  HENT: Negative for congestion, rhinorrhea, sinus pressure, sneezing, sore throat and trouble swallowing.   Eyes: Negative for photophobia and visual disturbance.  Respiratory: Negative for cough, chest tightness, shortness of breath, wheezing and stridor.   Cardiovascular: Negative for chest pain, palpitations and leg swelling.  Gastrointestinal: Negative for abdominal distention, abdominal pain, anal bleeding, blood in stool, constipation, diarrhea and nausea.  Genitourinary: Negative for difficulty urinating, dysuria, flank pain and hematuria.  Musculoskeletal: Negative for arthralgias, back pain, gait problem, joint swelling and myalgias.  Skin: Positive for color change. Negative for pallor, rash and wound.  Neurological: Negative for dizziness, tremors, weakness and light-headedness.  Hematological: Negative for adenopathy. Does not bruise/bleed easily.  Psychiatric/Behavioral: Positive for sleep disturbance. Negative for agitation, behavioral problems, confusion, decreased concentration and dysphoric mood. The patient is nervous/anxious.        Objective:   Physical Exam Vitals and  nursing note reviewed. Exam conducted with a chaperone present.  Constitutional:      General: She is not in acute distress.    Appearance: Normal  appearance. She is not diaphoretic.  HENT:     Head: Normocephalic and atraumatic.     Right Ear: External ear normal.     Left Ear: External ear normal.     Nose: Nose normal.     Mouth/Throat:     Pharynx: No oropharyngeal exudate.  Eyes:     General: No scleral icterus.    Extraocular Movements: Extraocular movements intact.     Conjunctiva/sclera: Conjunctivae normal.  Cardiovascular:     Rate and Rhythm: Normal rate and regular rhythm.     Heart sounds: Normal heart sounds. No murmur heard. No friction rub. No gallop.   Pulmonary:     Effort: Pulmonary effort is normal. No respiratory distress.     Breath sounds: No stridor. No wheezing or rales.  Chest:    Abdominal:     General: Bowel sounds are normal. There is no distension.     Palpations: Abdomen is soft.     Tenderness: There is no abdominal tenderness.  Musculoskeletal:        General: No tenderness.     Right shoulder: Decreased range of motion.     Cervical back: Normal range of motion and neck supple.  Lymphadenopathy:     Cervical: No cervical adenopathy.  Skin:    General: Skin is warm and dry.     Coloration: Skin is not pale.     Findings: No erythema or rash.  Neurological:     General: No focal deficit present.     Mental Status: She is alert and oriented to person, place, and time.     Coordination: Coordination normal.  Psychiatric:        Attention and Perception: Attention normal.        Mood and Affect: Mood is anxious.        Speech: Speech normal.        Behavior: Behavior is cooperative.        Thought Content: Thought content normal.        Cognition and Memory: Cognition and memory normal.        Judgment: Judgment normal.    Breast on January 19, 2021:       March 29th, 2022:      Today April 6th, 2022 after implant removal:     01/29/2021:         Assessment & Plan:   Breast implant infection:  She has had successful removal of the implant and MRSA  isolated  If we have had successful source control we should be able to cure this infection.  We I did doxycyline 100mg  bid and I have sent a month supply  Last night she experienced some streaking erythema across her chest but this has now largely resolved though there is still some erythema in the medial aspect of the left breast.  He does have Zyvox at home still.  She is worried about what to do over the weekend I have suggested that if she again experiences erythema streaking across the chest and this continues to progress that she should change out the doxycycline for Zyvox and discontinue her antidepressants.  She should also call us and let us know this is happening.  Long as we have surgical control this really should not be an issue as far as antimicrobial selection  because both doxycycline and Zyvox would be highly active against MRSA  We will have her see Janene Madeira next week in the clinic as well and I have also scheduled with her with me in May.  We will consider some decolonization regimens for her if she so wishes including Hibiclens showers with intranasal mupirocin for her and her husband if he is also wanting to do this.  Another more aggressive option for decolonization would be bleach baths.  We can address this down the road  Remaining breast implant: This is going to be removed electively I would recommend that she get preoperative daptomycin or vancomycin to cover for MRSA  Depression: She hadbeen taken off her antidepressant medications to avoid serotonin syndrome in the context of Zyvox but is now back on them.  Anxiety and panic attack: Understandable given all that she has gone through and given how sick she was when we admit her to the hospital last week.  Breast cancer: She has been cancer free and recent MRI showed no evidence of cancer she is also seen Dr. Jana Hakim on Friday  Pruritus: I wonder if this might be due to anxiety.  I am skeptical she is having  an allergy to doxycycline but if she does develop further allergic symptoms we can switch her to the Zyvox temporarily and then consider switch to Bactrim if we needed other anti-MRSA drug for more than a few weeks.  I spent greater than 30 minutes with the patient including greater than 50% of time in face to face counsel of the patient and in coordination of their care.

## 2021-02-02 ENCOUNTER — Other Ambulatory Visit: Payer: Self-pay | Admitting: *Deleted

## 2021-02-02 NOTE — Progress Notes (Signed)
Patient: Katelyn Shelton  DOB: 04-Nov-1975 MRN: [id number] PCP: Marin Olp, MD     Subjective:  Katelyn Shelton is a 45 y.o. 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  She is s/p bilateral mastectomy and breast reconstruction following breast cancer treatment.  Had recurrent cellulitis of the right breast since 2021 - treated with good response with Oritavancin / Dalbavancin infusions, zyvox (though it caused significant nausea and vomiting) in the past.   Unfortunately had another recurrence in March 2022 - D/W Dr. Donne Hazel who recommended consideration for implant removal with concern it was infected. She was seen in the clinic by Dr. Tommy Medal for assistance in antimicrobial management >> signs of systemic illness on 3/28 and admitted to the hospital via surgery team the 29th. Inflammatory markers elevated (ESR 107, CRP 30 mg/dL) and leukocytosis noted 16.7K. Underwent removal of R breast implant on 3/29 with intraoperative cultures growing out MRSA and description of cloudy thick loculated fluid within the implant pocket. Discharged home after long acting oritavancin infusion on 3/30.   FU with Dr. Tommy Medal again on 4/06 and 4/07 due to concern over possible increased redness and streaking. She was given oral doxycycline to start BID at that time as well as instructions to call over the weekend if this continued to worsen and follow up in the office in 1 week.   Since that time she has started back on the linezolid because she felt that the doxycycline was not working as well. She does feel better systemically but concerned about some yellow appearing thin drainage that leaked out from incision over the weekend. This has not recurred today. There is some tenderness along the incision still. Redness seems to have improved. She does have some concern over a softer area to the medial aspect of the breast near the sternum that she worries is abscess  but also remember she had some fat grafting with reconstruction. 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    She is having her left implant removed on 5/9 with [name] and FU with Dr. Tommy Medal again 5/6 to follow closely. She would like to know more about decolonization protocol to do prior to her next procedure. Hopeful she can stay on antibiotics through a trip she has planned in June to [place]. She is very very worried about recurrence.   She has had a cough with deep breathing since surgery that she is unclear as to the cause - no vocal changes. She does not elaborate on this much.    Review of Systems  Constitutional: Negative for chills and fever.  Respiratory: Positive for cough.   Cardiovascular: Negative for chest pain.  Gastrointestinal: Negative for abdominal pain, nausea and vomiting.  Skin:       Improved redness. Some drainage out of R inframammary incision from recent implant removal.   Psychiatric/Behavioral: The patient is nervous/anxious.     Past Medical History:  Diagnosis Date  . Allergy   . Anxiety    venflafaxine XR 75 mg  . Breast cancer ([place]) 01/2017   right; genetic testing negative in 2018 with [employer] panel  . Heart murmur    as child- went away  . History of MRSA infection    elbow  . Hyperlipidemia   . Infection of breast implant (  [place]) 01/19/2021  . Joint pain    due to medicine induced joint pain  . Migraine    otc med prn  . Miscarriage    x1  . MRSA infection 01/28/2021  . Nausea and vomiting 01/19/2021  . 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 Osteopenia   . UTI (urinary tract infection)     Outpatient Medications Prior to Visit  Medication Sig Dispense Refill  . linezolid (ZYVOX) 600 MG tablet Take 600 mg by mouth 2 (two) times daily.    . Calcium Carbonate (CALCIUM 600 PO) Take 1 capsule by mouth  daily.    [name] denosumab (PROLIA) 60 MG/ML SOSY injection Inject 60 mg into the skin every 6 (six) months.    . doxycycline (VIBRA-TABS) 100 MG tablet Take 1 tablet (100 mg total) by mouth 2 (two) times daily. (Patient not taking: Reported on 02/03/2021) 60 tablet 2  . Evolocumab 140 MG/ML SOAJ INJECT 1 PEN INTO THE SKIN EVERY 14 DAYS. (Patient taking differently: Inject 140 mg into the muscle every 14 (fourteen) days.) 2 mL 11  . exemestane (AROMASIN) 25 MG tablet TAKE 1 TABLET (25 MG TOTAL) BY MOUTH DAILY AFTER BREAKFAST. 90 tablet 3  . Multiple Vitamins-Minerals (MULTIVITAMIN WOMEN 50+) TABS Take 1 tablet by mouth daily.    . ondansetron (ZOFRAN-ODT) 4 MG disintegrating tablet TAKE 1 TABLET (4 MG TOTAL) BY MOUTH EVERY 8 (EIGHT) HOURS AS NEEDED FOR NAUSEA OR VOMITING. (Patient not taking: Reported on 01/28/2021) 60 tablet 0  . oxyCODONE (OXY IR/ROXICODONE) 5 MG immediate release tablet Take 1 tablet (5 mg total) by mouth every 6 (six) hours as needed for moderate pain. (Patient not taking: Reported on 01/28/2021) 10 tablet 0  . oxyCODONE (OXY IR/ROXICODONE) 5 MG immediate release tablet TAKE 1 TABLET (5 MG TOTAL) BY MOUTH EVERY SIX HOURS AS NEEDED FOR MODERATE PAIN. (Patient not taking: Reported on 01/28/2021) 10 tablet 0  . venlafaxine XR (EFFEXOR-XR) 37.5 MG 24 hr capsule TAKE 1 CAPSULE BY MOUTH DAILY WITH BREAKFAST 90 capsule 4   No facility-administered medications prior to visit.     Allergies  Allergen Reactions  . Fentanyl Itching    Severe itching - Patient refuses this medication  . Peach Flavor Anaphylaxis  . Penicillins Hives    AS A CHILD  . Sulfa Antibiotics Nausea And Vomiting  . Sulfasalazine Nausea And Vomiting and Other (See Comments)  . Latex Itching    Social History   Tobacco Use  . Smoking status: Never Smoker  . Smokeless tobacco: Never Used  Vaping Use  . Vaping Use: Never used  Substance Use Topics  . Alcohol use: Yes    Alcohol/week: 1.0 - 2.0 standard drink     Types: 1 - 2 Standard drinks or equivalent per week    Comment: occasional wine - twice monthly  . Drug use: No      Objective:   Vitals:   02/03/21 0846  Weight: 149 lb (67.6 kg)   Body mass index is 29.1 kg/m.  Physical Exam Vitals reviewed.  Constitutional:      Appearance: She is not ill-appearing.  Cardiovascular:     Rate and Rhythm: Normal rate.  Pulmonary:     Effort: Pulmonary effort is normal.  Chest:     Chest wall: Swelling and tenderness present.  Breasts:     Right: Skin change and tenderness present. No inverted nipple or axillary adenopathy.  Left: Normal. No swelling or nipple discharge.     Lymphadenopathy:     Upper Body:     Right upper body: No axillary or pectoral adenopathy.  Skin:    General: Skin is warm and dry.     Capillary Refill: Capillary refill takes less than 2 seconds.  Neurological:     Mental Status: She is alert and oriented to person, place, and time.         Lab Results: Lab Results  Component Value Date   WBC 10.2 01/22/2021   HGB 12.3 01/22/2021   HCT 37.1 01/22/2021   MCV 97.4 01/22/2021   PLT 262 01/22/2021    Lab Results  Component Value Date   CREATININE 0.63 01/20/2021   BUN 9 01/20/2021   NA 136 01/20/2021   K 3.3 (L) 01/20/2021   CL 102 01/20/2021   CO2 24 01/20/2021    Lab Results  Component Value Date   ALT 68 (H) 01/15/2021   AST 38 01/15/2021   ALKPHOS 48 01/15/2021   BILITOT 0.5 01/15/2021     Assessment & Plan:   Problem List Items Addressed This Visit      Unprioritized   Therapeutic drug monitoring    Lab Results  Component Value Date   WBC 10.2 01/22/2021   HGB 12.3 01/22/2021   HCT 37.1 01/22/2021   MCV 97.4 01/22/2021   PLT 262 01/22/2021   She will complete ~2 week course of linezolid then switch to doxy. Unlikely to have long term myelosuppressive effect with that short duration.  Continue to hold antidepressants during the linezolid course then to resume 24-48 hours  after last dose to avoid r/f serotonin syndrome.       Nausea and vomiting    A/W systemic illness from infection - this has since resolved and she reports no trouble with current antibiotics.       MRSA infection - Primary    We discussed decolonization protocol with Hibiclens scrub and mupirocin topically to nose and under fingernails to be done 5 days before her next surgical procedure. Suggested that her husband participate in this as well if he is going to be helping in her post-op care. We discussed that it is absolutely possible she may re-colonize after surgery but the goal would be to decrease bacterial load with skin incision and during healing time to lower the risk for post-operative infection complications. Understanding this does not completely remove risk.   I provided her with information regarding this protocol today.       Relevant Medications   linezolid (ZYVOX) 600 MG tablet   mupirocin cream (BACTROBAN) 2 %   Infection of breast implant ([place])    Now S/P removal from 3/29. She described some leaking of clear/yellow fluid over the weekend from incision. On exam today the area appears scabbed over nicely. Some scaled skin around incision as the swelling is improved compared to previous photos.  The area she is concerned over at the medial breast border feels more like grafted fat tissue and is not concerning to me for ongoing infection.   Of note, she does have pre-pectoral implant that was placed in 1996 and concerned that this may at risk to become infected. Operative note reviewed with her - I cannot tell the location of the loculated abscess fluid as it related to the other implant.   She will continue the current course of linezolid (as this is her preference) and then pick up taking the  doxycycline until her next appointment with Dr. Tommy Medal. Precautions discussed re: sun protection, esophagitis prevention and avoiding Doxy with dairy/iron foods and supplements.        Relevant Medications   linezolid (ZYVOX) 600 MG tablet   mupirocin cream (BACTROBAN) 2 %      Janene Madeira, MSN, NP-C [facility] for Infectious Disease [email or web address] Pager: [phone] Office: [phone] [place]: [phone]   02/03/21  1:00 PM

## 2021-02-02 NOTE — Patient Outreach (Signed)
[facility]) Care Management  02/02/2021  Katelyn Shelton 01-03-1976 [id number]   Transition of care call/Case Closure  Referral received: 01/22/21 Initial outreach attempt: 01/23/21 Insurance: [employer] call attempt to patient, person answering phone identified Katelyn Shelton,introduced self/role  purpose of the call to complete transition of care assessment. Patient politely declined to proceed with assessment, no return call preferred at this time.     Objective: Per the electronic medical record,[name] hospitalized [facility] for Recurrent cellulitis right chest for planned  Removal of Right Breast implant  Comorbidities include: Right breast cancer s/p surgery 2018, bilateral mastectomy and breast implants.  She  was discharged to home on 01/22/21  without the need for home health servicesor DME.    Plan: Will close case to [facility] care management services at this time.    Katelyn Draft, RN, BSN  [place] Management Coordinator  [phone]- Mobile [phone]- Toll Free Main Office

## 2021-02-03 ENCOUNTER — Encounter: Payer: Self-pay | Admitting: Infectious Diseases

## 2021-02-03 ENCOUNTER — Ambulatory Visit (INDEPENDENT_AMBULATORY_CARE_PROVIDER_SITE_OTHER): Payer: 59 | Admitting: Infectious Diseases

## 2021-02-03 ENCOUNTER — Other Ambulatory Visit: Payer: Self-pay

## 2021-02-03 ENCOUNTER — Other Ambulatory Visit (HOSPITAL_COMMUNITY): Payer: Self-pay

## 2021-02-03 VITALS — Wt 149.0 lb

## 2021-02-03 DIAGNOSIS — B9561 Methicillin susceptible Staphylococcus aureus infection as the cause of diseases classified elsewhere: Secondary | ICD-10-CM | POA: Diagnosis not present

## 2021-02-03 DIAGNOSIS — R112 Nausea with vomiting, unspecified: Secondary | ICD-10-CM

## 2021-02-03 DIAGNOSIS — Z5181 Encounter for therapeutic drug level monitoring: Secondary | ICD-10-CM | POA: Insufficient documentation

## 2021-02-03 DIAGNOSIS — T8579XD Infection and inflammatory reaction due to other internal prosthetic devices, implants and grafts, subsequent encounter: Secondary | ICD-10-CM | POA: Diagnosis not present

## 2021-02-03 DIAGNOSIS — A4902 Methicillin resistant Staphylococcus aureus infection, unspecified site: Secondary | ICD-10-CM

## 2021-02-03 MED ORDER — MUPIROCIN CALCIUM 2 % EX CREA
1.0000 "application " | TOPICAL_CREAM | Freq: Two times a day (BID) | CUTANEOUS | 1 refills | Status: DC
  Filled 2021-02-03: qty 30, 15d supply, fill #0

## 2021-02-03 NOTE — Assessment & Plan Note (Signed)
A/W systemic illness from infection - this has since resolved and she reports no trouble with current antibiotics.

## 2021-02-03 NOTE — Assessment & Plan Note (Signed)
Lab Results  Component Value Date   WBC 10.2 01/22/2021   HGB 12.3 01/22/2021   HCT 37.1 01/22/2021   MCV 97.4 01/22/2021   PLT 262 01/22/2021   She will complete ~2 week course of linezolid then switch to doxy. Unlikely to have long term myelosuppressive effect with that short duration.  Continue to hold antidepressants during the linezolid course then to resume 24-48 hours after last dose to avoid r/f serotonin syndrome.

## 2021-02-03 NOTE — Patient Instructions (Addendum)
Continue the linezolid and then start taking the doxycycline twice a day.   Remember the sun! Doxy makes you very prone to sun burns. Avoid the sun between 10-4, use hats, long sleeves and sun screen to help.  Take with food (not dairy) to help prevent nausea.  Full glass of water to prevent esophagus irritation.   I will send in your mupiricin ointment for you to use 5 days prior to surgery. Also Hibiclens to use - head to toe hair included. You don't need a prescription for this, can find it over the counter at different pharmacies.   Please schedule an appt with Dr. Tommy Medal at the end of May as well to have that lined up before your trip.     Five day Treatment Plan for MRSA (Staph) Decolonization  Please let us know if you have questions or concerns or do not understand the information we give you.  Prepare Chose a period when you will be uninterrupted by going away or other distractions. To ensure that your skin is in good condition, follow the Routine Skin Care principles below to reduce drying and enhance healing. Do not start while you have any active boils.  Routine Skin Care principles to reduce drying and enhance healing:  Avoid the use of soap when bathing or showering. DO NOT routinely use antiseptic solutions. If you need to use something, chose a soap substitute (examples -QV Wash or Cetaphil).   When drying with a towel, be gentle and pat dry your skin. Avoid rubbing the skin.   Reduce the overall frequency of bathing or showering. A short shower (3 minutes) is better than a bath in terms of its effect on the skin.   Use a simple sorbelene based-cream on your skin prior to showering and immediately after drying. (Examples: Hydroderm or other Sorbelene-based preparations). Especially protect healing or dry areas of skin in this way. Don't use a barrier cream with a vaseline base or with perfumes and additives. You are more likely to be allergic to these products, and cause  further damage to your skin.   Make sure that you clean and cover any skin cuts or grazes that occur. Try to avoid picking or biting fingernails and the skin around the nails Keep your fingernails clipped short and clean to reduce problems caused by scratching.   For itchy skin, try gently massaging sorbelene-based cream into itchy areas instead of scratching it. A long-acting non-sedating antihistamine drug is the next option to try. However make sure that you are not taking medication that will interact with this drug type.   To prepare yourself for your treatment, it is recommended that you complete the following steps:  Remove nose, ear and other body piercing items for several days prior to the treatment and keep them out during the treatment period   Purchase a new toothbrush, disposable razor (if used), sterident for dentures (if required) and a container of alcohol hand hygiene solution (gel or rub)   Discard old toothbrushes and razors when the treatment starts. Also discard opened deodorant rollers, skin adhesive tapes, skin creams and solutions- all of these may already be contaminated with staph   Discard pumice stone(s), sponges and disposable face cloths if used    Discard all make-up brushes, creams, and implements   Discard or hot wash all fluffy toys   Wash hair brush and comb, nail files, plastic toys and cutters in the dishwasher or purchase new ones   Remove nail  varnish and artificial nails   Daily routine for 5 days     *Minimize contact with members of the community during the 5 days of treatment*   Body washes The effectiveness of the program increases if the correct procedure is used:  Apply the provided antiseptic body wash (2% chlorhexidine) in the shower daily -->Hibiclens  Take care to wash hair, under the arms and into the groin and into any folds of skin   Allow the antiseptic to remain on the skin for at least 5 minutes   Nasal ointment  Disinfect  your hands with alcohol gel/rub and allow to dry    Open the mupirocin 2% (Bactroban) nasal ointment.   Place small amount (size of match head) of ointment onto a clean cotton swab and massage gently around the inside of the nostril on one side, making sure not to insert it too deeply (no more than 2 cm or a little less than an inch).   Use a new cotton bud for the other nostril so that you do not contaminate the Bactroban tube with staph.    After applying the ointment, press a finger against the nose next to the nostril opening and use a circular motion to spread the ointment within the nose   Apply the mupirocin ointment two times a day for 5 days   Disinfect your hands with alcohol rub/gel after applying the ointmentp>  Personal items (combs, razor, eyeglasses, jewelry, etc.)     Disinfect all personal items daily with an alcohol-based cleanser   House Environment and Clothes/Linens  On day 2 and 5 of the treatment, clean your house, (especially the bedroom and bathroom). Clean dust off all surfaces and then vacuum clean all floor surfaces AND soft furnishings (such as your favorite chair). If your chair/couch has a vinyl or leather covering then wipe over the chair with warm soapy water and then dry with a clean towel (which should then be washed). Staph lives in skin scales from humans that contaminate the environment. This can lead to re-infection.    Disinfect the shower floor and/or bath tub daily    On days 1, 3, AND 5 of the treatment wash your clothes, underwear, pajamas and bed linen (such as towels, sheets, washcloths, and bath mats). A hot wash with laundry detergent is best (there is no need to use expensive laundry detergent or powder). Dry clothes in sun if possible. Change into clean clothes or pajamas on those days after your shower.    Do not share or exchange any personal items of clothing   Sports/Gym     Surfaces, equipment and towels, and skin-to-skin contact are  all potential sources for staph re-infection  Pets Dogs and other companion animals can also be colonized with the same strains of MRSA. Best to wash or replace bedding material for the animal and wash the animal at least once during the treatment period with antiseptic solution (2% chlorhexidine wash). Ensure that the skin of the animal is kept in good condition. If the pet has any chronic skin disorders, consult your vet prior to starting your treatment process.  What about my partner, family, or household members? Usually when an aggressive strain of staph moves in to a family or household, only certain members of that group get infections (boils). This is despite the fact that the strain has probably transferred itself from person to person within the group. Those without boils may also be carrying the bacterium, however they must  have better resistance (immunity) or perhaps have better skin condition. Staph likes to invade through cuts, scratches and skin with dermatitis or dryness.   Follow-up after decolonization treatment  Possible approaches will vary depending on how your treatment goes. Your provider will instruct you on the follow-up best suited for you. Some options are:   Wait and see - if no further boils occur within 6 months then it is probable that the strain of staph has been eliminated from you.    Continue intermittent body washes 1-2 times per week with 2% aqueous chlorhexidine soap preparation or similar    Future antibiotic use  The best preventative approach to avoiding future problems may be to avoid use of antibiotics unless there is a strong indication. Antibiotics are often prescribed for minor infections or for respiratory infections that are mostly due to viruses. It is in your best interest to ride these infections out rather than taking antibiotics. Taking antibiotics alters your natural bacterial flora on your skin and in your gut. This may reduce your resistance  against acquiring a resistant bacterial strain such as MRSA).   Important note: if you do become very ill with possible infection and require hospital review, it is important for you to remind your medical care providers that you have been colonized with MRSA in the past as they may have to use antibiotics that are active against the strain that you had previously.   References Wiese-Posselt et al. Clin Inf Dis 1586:82:B74  CDC:  StyleTurn.tn.html

## 2021-02-03 NOTE — Assessment & Plan Note (Signed)
We discussed decolonization protocol with Hibiclens scrub and mupirocin topically to nose and under fingernails to be done 5 days before her next surgical procedure. Suggested that her husband participate in this as well if he is going to be helping in her post-op care. We discussed that it is absolutely possible she may re-colonize after surgery but the goal would be to decrease bacterial load with skin incision and during healing time to lower the risk for post-operative infection complications. Understanding this does not completely remove risk.   I provided her with information regarding this protocol today.

## 2021-02-03 NOTE — Assessment & Plan Note (Signed)
Now S/P removal from 3/29. She described some leaking of clear/yellow fluid over the weekend from incision. On exam today the area appears scabbed over nicely. Some scaled skin around incision as the swelling is improved compared to previous photos.  The area she is concerned over at the medial breast border feels more like grafted fat tissue and is not concerning to me for ongoing infection.   Of note, she does have pre-pectoral implant that was placed in 1996 and concerned that this may at risk to become infected. Operative note reviewed with her - I cannot tell the location of the loculated abscess fluid as it related to the other implant.   She will continue the current course of linezolid (as this is her preference) and then pick up taking the doxycycline until her next appointment with Dr. Tommy Medal. Precautions discussed re: sun protection, esophagitis prevention and avoiding Doxy with dairy/iron foods and supplements.

## 2021-02-05 ENCOUNTER — Other Ambulatory Visit (HOSPITAL_COMMUNITY): Payer: Self-pay

## 2021-02-12 ENCOUNTER — Telehealth: Payer: Self-pay

## 2021-02-12 ENCOUNTER — Other Ambulatory Visit (HOSPITAL_COMMUNITY): Payer: Self-pay

## 2021-02-12 MED FILL — Venlafaxine HCl Cap ER 24HR 37.5 MG (Base Equivalent): ORAL | 90 days supply | Qty: 90 | Fill #0 | Status: AC

## 2021-02-12 NOTE — Telephone Encounter (Signed)
If she has not had 1 done within 3 to 4 months of the last 1-lets complete another

## 2021-02-12 NOTE — Telephone Encounter (Signed)
From your lab note in January "I tried to call patient but no answer. Did not leave voicemail. These are very mild elevations and unlikely to be related to breast cancer. Usually we monitor levels unless significantly over 100 and her levels are 43 and 70. I think repeating at interval discussed on last note is reasonable". Since she has had several does she need to continue?

## 2021-02-12 NOTE — Telephone Encounter (Signed)
Patient has a liver tested 3 times since January at the cancer center  and doesn't know if she needs to come in for labs to get retested because the results were elevated   Patient does not drink so she states that's not the reason  Please advise Thank you

## 2021-02-12 NOTE — Telephone Encounter (Signed)
Dr. Yong Channel would like for her to have another completed, future order placed ok to schedule lab visit.

## 2021-02-13 NOTE — Telephone Encounter (Signed)
FYI

## 2021-02-13 NOTE — Telephone Encounter (Signed)
Pt had liver tested towards the end of March. It was still elevated. Pt states her oncologist is following this matter.

## 2021-02-18 ENCOUNTER — Other Ambulatory Visit: Payer: Self-pay

## 2021-02-23 ENCOUNTER — Other Ambulatory Visit: Payer: Self-pay

## 2021-02-23 ENCOUNTER — Encounter (HOSPITAL_BASED_OUTPATIENT_CLINIC_OR_DEPARTMENT_OTHER): Payer: Self-pay | Admitting: Plastic Surgery

## 2021-02-24 ENCOUNTER — Telehealth: Payer: Self-pay | Admitting: *Deleted

## 2021-02-24 ENCOUNTER — Other Ambulatory Visit: Payer: Self-pay | Admitting: *Deleted

## 2021-02-24 DIAGNOSIS — C50411 Malignant neoplasm of upper-outer quadrant of right female breast: Secondary | ICD-10-CM

## 2021-02-24 DIAGNOSIS — Z170 Estrogen receptor positive status [ER+]: Secondary | ICD-10-CM

## 2021-02-24 NOTE — Telephone Encounter (Signed)
Pt called and requested a referral to see Dr. Dewaine Conger at [place] for reconstruction. Referral sent

## 2021-02-25 ENCOUNTER — Other Ambulatory Visit (HOSPITAL_COMMUNITY): Payer: Self-pay

## 2021-02-25 LAB — FUNGUS CULTURE RESULT

## 2021-02-25 LAB — FUNGAL ORGANISM REFLEX

## 2021-02-25 LAB — FUNGUS CULTURE WITH STAIN

## 2021-02-25 MED ORDER — OXYCODONE HCL 5 MG PO TABS
5.0000 mg | ORAL_TABLET | ORAL | 0 refills | Status: AC | PRN
  Filled 2021-02-25: qty 10, 5d supply, fill #0

## 2021-02-25 MED ORDER — ONDANSETRON 8 MG PO TBDP
8.0000 mg | ORAL_TABLET | Freq: Three times a day (TID) | ORAL | 0 refills | Status: DC | PRN
  Filled 2021-02-25: qty 10, 4d supply, fill #0

## 2021-02-25 NOTE — H&P (Signed)
Subjective:     Patient ID: Katelyn Shelton is a 44 y.o. female.  Follow-up  5 weeks post op removal right chest implant following onset fevers erythema. Cx with MRSA. Received Orbactiv on 3.31.22. Continues on doxycycline per ID. Has appt with Dr. Van Dam 5.6.22. Completing Bactroban and  CHG for MRSA colonization. Reports husband is as well. Scheduled for left implant removal per patient request next week.  Presented with palpable mass right breast. MMG with mass containing heterogeneous calcifications  identified within the UOQ. Additional 3 mm group of slightly heterogeneous calcifications within the retroareolar left breast noted. US showed 4 x 1.6 x 3.5 cm mass at the 10 o'clock position 4 cm from the nipple. Biopsy demonstrated IDC with DCIS, ER/PR+, Her2-. Final pathology 1/1 SLN, right IDC spanning at least 4 cm with both retroareolar and nipple specimen positive for IDC. +LVI, perineural invasion. She underwent resection right NAC with clear margins, no further carcinoma.  Oncotype 11. Genetics negative. Mammaprint low risk. Patient completed adjuvant chemotherapy and completed adjuvant radiation 12.7.18. On Aromasin.   PMH significant for silicone implant as part of pectus excavatum repair performed by Dr. Argenta 1996.  Prior 32B. Left mastectomy 289 g Right 239 g  Review of Systems     Objective:   Physical Exam  Cardiovascular: Normal rate, regular rhythm and normal heart sounds.  Pulmonary/Chest: Effort normal and breath sounds normal.    Chest: left soft implant in place Right chest soft tissue contracted, scar maturing No cellulitis, hyperpigmentation and edema right chest consistent with RT  Assessment:     R breast ca UOQ Pectus excavatum, s/p silicone implant placement S/p bilateral NSM, right SLN, TE/ADM (Alloderm) reconstruction S/p excision right NAC Adjuvant chemotherapy, radiation S/p silicone implant exchange, lipofilling, excision nevus  abdomen S/p lipoflling bilateral chest, right NAC recon with FTSG, nipple sharing, revision left with mastectomy flap excision at IMF Cellulitis right chest- s/p removal right implant (MRSA)    Plan:   Patient desires removal left implant. Has had consultations at [facility] Dr. Hollenbeck. Not candidate for DIEP in part due to pectus implant. Discussed omental flap reconstruction. Requested consult with [facility] in [place].  Reviewed the right chest soft tissue contraction present today will not change and can expect similar on left with removal implant alone. Patient declines any excision NAC or reconstructed NAC. Reviewed drain, OP surgery.  Counseled with removal implants breast I do not recommend any long term antibiotic treatment, clinically no signs that pectus implant has been affected. Reviewed left chest soft tissue will also contract and may have depression. Discussed current right chest appearance appears consistent with RT changes and that despite left chest soft tissue contraction expected will still appear different than right. Reviewed can use Q tip for cleaning area depressed soft tissue chest if needed, long term if this is a problem can consider excision NACs.   Rx for oxycodone and Zofran given.   Natrelle Inspira Full Projection 415 ml implant LEFT. REF [id number]  #1 55 ml fat infiltrated over right chest, 40 ml over left chest.  #2 35 ml fat infiltrated over left superior pole reconstruction, 20 ml fat infiltrated right axilla and upper outer right breast reconstruction     

## 2021-02-26 ENCOUNTER — Other Ambulatory Visit (HOSPITAL_COMMUNITY): Payer: Self-pay

## 2021-02-26 MED FILL — Evolocumab Subcutaneous Soln Auto-Injector 140 MG/ML: SUBCUTANEOUS | 28 days supply | Qty: 2 | Fill #1 | Status: AC

## 2021-02-26 NOTE — Progress Notes (Signed)

## 2021-02-27 ENCOUNTER — Telehealth (INDEPENDENT_AMBULATORY_CARE_PROVIDER_SITE_OTHER): Payer: 59 | Admitting: Infectious Disease

## 2021-02-27 ENCOUNTER — Other Ambulatory Visit: Payer: Self-pay

## 2021-02-27 ENCOUNTER — Other Ambulatory Visit (HOSPITAL_COMMUNITY)
Admission: RE | Admit: 2021-02-27 | Discharge: 2021-02-27 | Disposition: A | Discharge status: Home / Self Care | Payer: 59 | Source: Ambulatory Visit | Attending: Plastic Surgery | Admitting: Plastic Surgery

## 2021-02-27 ENCOUNTER — Ambulatory Visit: Payer: 59 | Admitting: Infectious Disease

## 2021-02-27 DIAGNOSIS — Z01812 Encounter for preprocedural laboratory examination: Secondary | ICD-10-CM | POA: Diagnosis not present

## 2021-02-27 DIAGNOSIS — C50411 Malignant neoplasm of upper-outer quadrant of right female breast: Secondary | ICD-10-CM | POA: Diagnosis not present

## 2021-02-27 DIAGNOSIS — T8579XD Infection and inflammatory reaction due to other internal prosthetic devices, implants and grafts, subsequent encounter: Secondary | ICD-10-CM | POA: Diagnosis not present

## 2021-02-27 DIAGNOSIS — Z170 Estrogen receptor positive status [ER+]: Secondary | ICD-10-CM | POA: Diagnosis not present

## 2021-02-27 DIAGNOSIS — Z20822 Contact with and (suspected) exposure to covid-19: Secondary | ICD-10-CM | POA: Diagnosis not present

## 2021-02-27 DIAGNOSIS — A4902 Methicillin resistant Staphylococcus aureus infection, unspecified site: Secondary | ICD-10-CM | POA: Diagnosis not present

## 2021-02-27 LAB — SARS CORONAVIRUS 2 (TAT 6-24 HRS): SARS Coronavirus 2: NEGATIVE

## 2021-02-27 NOTE — Progress Notes (Addendum)
Virtual Visit via Telephone Note  I connected with Katelyn Shelton on 02/27/21 at 11:30 AM EDT by telephone and verified that I am speaking with the correct person using two identifiers.  Location: Patient: Home Provider: [facility]   I discussed the limitations, risks, security and privacy concerns of performing an evaluation and management service by telephone and the availability of in person appointments. I also discussed with the patient that there may be a patient responsible charge related to this service. The patient expressed understanding and agreed to proceed.   History of Present Illness:  Katelyn Shelton is a 45 year old Caucasian female who developed a breast implant associated infection with MRSA status post removal of breast implant.  When I last saw her she was on doxycycline but concerned that erythema might be worsening so I given her also I have ox that she could take if she felt things were worsening.  She indeed did start Zyvox with improvement of her erythema.  She saw Katelyn Shelton with some concerns about an area over the medial aspect of the breast as well at that time.  She does have a prepectoral implant under the muscle as well but is this is not caused a problem in the past and has not been found on imaging to be problematic.  She came to clinic today because she wanted to check about antibiotics.  Operatively.  I was willing to work her in as a patient in the clinic though we were at the time overbooked.  Instead she decided to convert to a phone visit.  He says that she is doing well though still has some continued erythema she has had this looked at by Katelyn Shelton and it is thought to be sequela from radiation, as well as infection and may not return to being completely normal.  Schedule however opposite breast implant removed on Monday.  She is still on doxycycline.  She is doing a decolonization protocol that Katelyn Shelton initiated with her.  I would like her to get daptomycin  preoperatively and if this is not possible vancomycin I also asked her to continue her doxycycline through her surgery.     Observations/Objective: Appear to be doing relatively well though she is anxious that the neck surgery goes well as well.    Assessment and Plan:  Breast implant factions with MRSA status post removal of implant and fairly extended course of antibiotics.  She is scheduled to have elective removal of the left implant that is not infected.  She is doing her decolonization regimen I will have her do doxycycline through surgery as well.  Also asked for her to have daptomycin preoperatively.    Follow Up Instructions:    I discussed the assessment and treatment plan with the patient. The patient was provided an opportunity to ask questions and all were answered. The patient agreed with the plan and demonstrated an understanding of the instructions.   The patient was advised to call back or seek an in-person evaluation if the symptoms worsen or if the condition fails to improve as anticipated.  I provided  18  minutes of non-face-to-face time during this encounter.   Alcide Evener, MD

## 2021-03-02 ENCOUNTER — Ambulatory Visit (HOSPITAL_BASED_OUTPATIENT_CLINIC_OR_DEPARTMENT_OTHER): Payer: 59 | Admitting: Certified Registered"

## 2021-03-02 ENCOUNTER — Encounter (HOSPITAL_BASED_OUTPATIENT_CLINIC_OR_DEPARTMENT_OTHER)
Admission: RE | Disposition: A | Discharge status: Home / Self Care | Payer: Self-pay | Source: Home / Self Care | Attending: Plastic Surgery

## 2021-03-02 ENCOUNTER — Other Ambulatory Visit: Payer: Self-pay

## 2021-03-02 ENCOUNTER — Encounter (HOSPITAL_BASED_OUTPATIENT_CLINIC_OR_DEPARTMENT_OTHER): Payer: Self-pay | Admitting: Plastic Surgery

## 2021-03-02 ENCOUNTER — Ambulatory Visit (HOSPITAL_BASED_OUTPATIENT_CLINIC_OR_DEPARTMENT_OTHER)
Admission: RE | Admit: 2021-03-02 | Discharge: 2021-03-02 | Disposition: A | Discharge status: Home / Self Care | Payer: 59 | Attending: Plastic Surgery | Admitting: Plastic Surgery

## 2021-03-02 DIAGNOSIS — Z9013 Acquired absence of bilateral breasts and nipples: Secondary | ICD-10-CM | POA: Insufficient documentation

## 2021-03-02 DIAGNOSIS — Z792 Long term (current) use of antibiotics: Secondary | ICD-10-CM | POA: Insufficient documentation

## 2021-03-02 DIAGNOSIS — Z45812 Encounter for adjustment or removal of left breast implant: Secondary | ICD-10-CM | POA: Diagnosis not present

## 2021-03-02 DIAGNOSIS — Z853 Personal history of malignant neoplasm of breast: Secondary | ICD-10-CM | POA: Diagnosis not present

## 2021-03-02 DIAGNOSIS — E7801 Familial hypercholesterolemia: Secondary | ICD-10-CM | POA: Diagnosis not present

## 2021-03-02 HISTORY — PX: BREAST IMPLANT REMOVAL: SHX5361

## 2021-03-02 SURGERY — REMOVAL, IMPLANT, BREAST
Anesthesia: General | Site: Chest | Laterality: Left

## 2021-03-02 MED ORDER — MORPHINE SULFATE (PF) 4 MG/ML IV SOLN
1.0000 mg | INTRAVENOUS | Status: DC | PRN
  Administered 2021-03-02 (×2): 1 mg via INTRAVENOUS

## 2021-03-02 MED ORDER — ACETAMINOPHEN 500 MG PO TABS
ORAL_TABLET | ORAL | Status: AC
  Filled 2021-03-02: qty 2

## 2021-03-02 MED ORDER — SUFENTANIL CITRATE 50 MCG/ML IV SOLN
INTRAVENOUS | Status: AC
  Filled 2021-03-02: qty 1

## 2021-03-02 MED ORDER — FENTANYL CITRATE (PF) 100 MCG/2ML IJ SOLN
INTRAMUSCULAR | Status: AC
  Filled 2021-03-02: qty 2

## 2021-03-02 MED ORDER — MIDAZOLAM HCL 5 MG/5ML IJ SOLN
INTRAMUSCULAR | Status: DC | PRN
  Administered 2021-03-02: 2 mg via INTRAVENOUS

## 2021-03-02 MED ORDER — OXYCODONE HCL 5 MG PO TABS
ORAL_TABLET | ORAL | Status: AC
  Filled 2021-03-02: qty 1

## 2021-03-02 MED ORDER — DIPHENHYDRAMINE HCL 50 MG/ML IJ SOLN: 6.2500 mg | Freq: Once | INTRAMUSCULAR | Status: DC

## 2021-03-02 MED ORDER — DIPHENHYDRAMINE HCL 50 MG/ML IJ SOLN
6.2500 mg | Freq: Once | INTRAMUSCULAR | Status: AC
  Administered 2021-03-02: 6.5 mg via INTRAVENOUS

## 2021-03-02 MED ORDER — GABAPENTIN 300 MG PO CAPS
300.0000 mg | ORAL_CAPSULE | ORAL | Status: AC
  Administered 2021-03-02: 300 mg via ORAL

## 2021-03-02 MED ORDER — BUPIVACAINE-EPINEPHRINE 0.25% -1:200000 IJ SOLN
INTRAMUSCULAR | Status: DC | PRN
  Administered 2021-03-02: 15 mL

## 2021-03-02 MED ORDER — MORPHINE SULFATE (PF) 4 MG/ML IV SOLN
INTRAVENOUS | Status: AC
  Filled 2021-03-02: qty 1

## 2021-03-02 MED ORDER — ONDANSETRON HCL 4 MG/2ML IJ SOLN
INTRAMUSCULAR | Status: DC | PRN
  Administered 2021-03-02: 4 mg via INTRAVENOUS

## 2021-03-02 MED ORDER — GABAPENTIN 300 MG PO CAPS
ORAL_CAPSULE | ORAL | Status: AC
  Filled 2021-03-02: qty 1

## 2021-03-02 MED ORDER — OXYCODONE HCL 5 MG PO TABS
5.0000 mg | ORAL_TABLET | Freq: Once | ORAL | Status: AC | PRN
  Administered 2021-03-02: 5 mg via ORAL

## 2021-03-02 MED ORDER — MIDAZOLAM HCL 2 MG/2ML IJ SOLN
INTRAMUSCULAR | Status: AC
  Filled 2021-03-02: qty 2

## 2021-03-02 MED ORDER — OXYCODONE HCL 5 MG/5ML PO SOLN
5.0000 mg | Freq: Once | ORAL | Status: AC | PRN
Start: 2021-03-02 — End: 2021-03-02

## 2021-03-02 MED ORDER — SUFENTANIL CITRATE 50 MCG/ML IV SOLN
INTRAVENOUS | Status: DC | PRN
  Administered 2021-03-02: 5 ug via INTRAVENOUS

## 2021-03-02 MED ORDER — CHLORHEXIDINE GLUCONATE CLOTH 2 % EX PADS: 6.0000 | MEDICATED_PAD | Freq: Once | CUTANEOUS | Status: DC

## 2021-03-02 MED ORDER — LACTATED RINGERS IV SOLN: INTRAVENOUS | Status: DC

## 2021-03-02 MED ORDER — DEXMEDETOMIDINE (PRECEDEX) IN NS 20 MCG/5ML (4 MCG/ML) IV SYRINGE
PREFILLED_SYRINGE | INTRAVENOUS | Status: AC
  Filled 2021-03-02: qty 10

## 2021-03-02 MED ORDER — DEXAMETHASONE SODIUM PHOSPHATE 4 MG/ML IJ SOLN
INTRAMUSCULAR | Status: DC | PRN
  Administered 2021-03-02: 4 mg via INTRAVENOUS

## 2021-03-02 MED ORDER — CELECOXIB 200 MG PO CAPS
ORAL_CAPSULE | ORAL | Status: AC
  Filled 2021-03-02: qty 1

## 2021-03-02 MED ORDER — DIPHENHYDRAMINE HCL 50 MG/ML IJ SOLN
INTRAMUSCULAR | Status: AC
  Filled 2021-03-02: qty 1

## 2021-03-02 MED ORDER — SODIUM CHLORIDE 0.9 % IV SOLN
6.0000 mg/kg | INTRAVENOUS | Status: DC
  Filled 2021-03-02 (×2): qty 8

## 2021-03-02 MED ORDER — CELECOXIB 200 MG PO CAPS
200.0000 mg | ORAL_CAPSULE | ORAL | Status: AC
  Administered 2021-03-02: 200 mg via ORAL

## 2021-03-02 MED ORDER — BUPIVACAINE-EPINEPHRINE (PF) 0.25% -1:200000 IJ SOLN
INTRAMUSCULAR | Status: AC
  Filled 2021-03-02: qty 30

## 2021-03-02 MED ORDER — ONDANSETRON HCL 4 MG/2ML IJ SOLN: 4.0000 mg | Freq: Once | INTRAMUSCULAR | Status: DC | PRN

## 2021-03-02 MED ORDER — ACETAMINOPHEN 500 MG PO TABS
1000.0000 mg | ORAL_TABLET | ORAL | Status: AC
  Administered 2021-03-02: 1000 mg via ORAL

## 2021-03-02 MED ORDER — PROPOFOL 10 MG/ML IV BOLUS
INTRAVENOUS | Status: DC | PRN
  Administered 2021-03-02: 200 mg via INTRAVENOUS

## 2021-03-02 SURGICAL SUPPLY — 67 items
ADH SKN CLS APL DERMABOND .7 (GAUZE/BANDAGES/DRESSINGS) ×1
APL PRP STRL LF DISP 70% ISPRP (MISCELLANEOUS) ×1
BAG DECANTER FOR FLEXI CONT (MISCELLANEOUS) IMPLANT
BINDER BREAST LRG (GAUZE/BANDAGES/DRESSINGS) ×2 IMPLANT
BINDER BREAST MEDIUM (GAUZE/BANDAGES/DRESSINGS) IMPLANT
BINDER BREAST XLRG (GAUZE/BANDAGES/DRESSINGS) IMPLANT
BINDER BREAST XXLRG (GAUZE/BANDAGES/DRESSINGS) IMPLANT
BLADE SURG 10 STRL SS (BLADE) ×2 IMPLANT
BLADE SURG 15 STRL LF DISP TIS (BLADE) IMPLANT
BLADE SURG 15 STRL SS (BLADE)
BNDG GAUZE ELAST 4 BULKY (GAUZE/BANDAGES/DRESSINGS) IMPLANT
CANISTER SUCT 1200ML W/VALVE (MISCELLANEOUS) ×2 IMPLANT
CHLORAPREP W/TINT 26 (MISCELLANEOUS) ×2 IMPLANT
COVER BACK TABLE 60X90IN (DRAPES) ×2 IMPLANT
COVER MAYO STAND STRL (DRAPES) ×2 IMPLANT
COVER WAND RF STERILE (DRAPES) IMPLANT
DECANTER SPIKE VIAL GLASS SM (MISCELLANEOUS) IMPLANT
DERMABOND ADVANCED (GAUZE/BANDAGES/DRESSINGS) ×1
DERMABOND ADVANCED .7 DNX12 (GAUZE/BANDAGES/DRESSINGS) ×1 IMPLANT
DRAIN CHANNEL 15F RND FF W/TCR (WOUND CARE) ×2 IMPLANT
DRAPE INCISE IOBAN 66X45 STRL (DRAPES) IMPLANT
DRAPE TOP ARMCOVERS (MISCELLANEOUS) ×2 IMPLANT
DRAPE U-SHAPE 76X120 STRL (DRAPES) ×2 IMPLANT
DRAPE UTILITY XL STRL (DRAPES) ×2 IMPLANT
DRSG PAD ABDOMINAL 8X10 ST (GAUZE/BANDAGES/DRESSINGS) ×2 IMPLANT
ELECT BLADE 4.0 EZ CLEAN MEGAD (MISCELLANEOUS)
ELECT BLADE 6.5 EXT (BLADE) IMPLANT
ELECT COATED BLADE 2.86 ST (ELECTRODE) ×2 IMPLANT
ELECT REM PT RETURN 9FT ADLT (ELECTROSURGICAL) ×2
ELECTRODE BLDE 4.0 EZ CLN MEGD (MISCELLANEOUS) IMPLANT
ELECTRODE REM PT RTRN 9FT ADLT (ELECTROSURGICAL) ×1 IMPLANT
EVACUATOR SILICONE 100CC (DRAIN) ×2 IMPLANT
GAUZE SPONGE 4X4 12PLY STRL (GAUZE/BANDAGES/DRESSINGS) IMPLANT
GAUZE SPONGE 4X4 12PLY STRL LF (GAUZE/BANDAGES/DRESSINGS) IMPLANT
GLOVE SURG HYDRASOFT LTX SZ5.5 (GLOVE) IMPLANT
GLOVE SURG POLYISO LF SZ8 (GLOVE) ×2 IMPLANT
GLOVE SURG UNDER POLY LF SZ7 (GLOVE) ×2 IMPLANT
GOWN STRL REUS W/ TWL LRG LVL3 (GOWN DISPOSABLE) ×1 IMPLANT
GOWN STRL REUS W/TWL 2XL LVL3 (GOWN DISPOSABLE) ×2 IMPLANT
GOWN STRL REUS W/TWL LRG LVL3 (GOWN DISPOSABLE) ×2
MARKER SKIN DUAL TIP RULER LAB (MISCELLANEOUS) IMPLANT
NEEDLE HYPO 25X1 1.5 SAFETY (NEEDLE) ×2 IMPLANT
NS IRRIG 1000ML POUR BTL (IV SOLUTION) ×2 IMPLANT
PACK BASIN DAY SURGERY FS (CUSTOM PROCEDURE TRAY) ×2 IMPLANT
PENCIL SMOKE EVACUATOR (MISCELLANEOUS) ×2 IMPLANT
PIN SAFETY STERILE (MISCELLANEOUS) ×2 IMPLANT
SHEET MEDIUM DRAPE 40X70 STRL (DRAPES) ×2 IMPLANT
SLEEVE SCD COMPRESS KNEE MED (STOCKING) ×2 IMPLANT
SPONGE LAP 18X18 RF (DISPOSABLE) ×2 IMPLANT
STAPLER VISISTAT 35W (STAPLE) ×2 IMPLANT
STRIP CLOSURE SKIN 1/2X4 (GAUZE/BANDAGES/DRESSINGS) ×2 IMPLANT
SUT ETHILON 2 0 FS 18 (SUTURE) ×2 IMPLANT
SUT MNCRL AB 4-0 PS2 18 (SUTURE) ×2 IMPLANT
SUT VIC AB 3-0 PS1 18 (SUTURE)
SUT VIC AB 3-0 PS1 18XBRD (SUTURE) IMPLANT
SUT VIC AB 3-0 SH 27 (SUTURE)
SUT VIC AB 3-0 SH 27X BRD (SUTURE) IMPLANT
SUT VICRYL 4-0 PS2 18IN ABS (SUTURE) ×2 IMPLANT
SWAB COLLECTION DEVICE MRSA (MISCELLANEOUS) IMPLANT
SWAB CULTURE ESWAB REG 1ML (MISCELLANEOUS) IMPLANT
SYR 50ML LL SCALE MARK (SYRINGE) IMPLANT
SYR BULB IRRIG 60ML STRL (SYRINGE) ×2 IMPLANT
SYR CONTROL 10ML LL (SYRINGE) ×2 IMPLANT
TOWEL GREEN STERILE FF (TOWEL DISPOSABLE) ×2 IMPLANT
TUBE CONNECTING 20X1/4 (TUBING) ×2 IMPLANT
UNDERPAD 30X36 HEAVY ABSORB (UNDERPADS AND DIAPERS) ×4 IMPLANT
YANKAUER SUCT BULB TIP NO VENT (SUCTIONS) ×2 IMPLANT

## 2021-03-02 NOTE — Anesthesia Postprocedure Evaluation (Signed)
Anesthesia Post Note  Patient: Katelyn Shelton  Procedure(s) Performed: REMOVAL LEFT CHEST IMPLANT AND CAPSULECTOMY (Left Chest)     Patient location during evaluation: PACU Anesthesia Type: General Level of consciousness: awake and alert Pain management: pain level controlled Vital Signs Assessment: post-procedure vital signs reviewed and stable Respiratory status: spontaneous breathing, nonlabored ventilation, respiratory function stable and patient connected to nasal cannula oxygen Cardiovascular status: blood pressure returned to baseline and stable Postop Assessment: no apparent nausea or vomiting Anesthetic complications: no   No complications documented.  Last Vitals:  Vitals:   03/02/21 1315 03/02/21 1330  BP: 132/85 138/85  Pulse: 81 84  Resp: 14 17  Temp: (!) 36.4 C   SpO2: 100% 100%    Last Pain:  Vitals:   03/02/21 1315  TempSrc:   PainSc: Asleep                 Kingjames Coury COKER

## 2021-03-02 NOTE — Interval H&P Note (Signed)
History and Physical Interval Note:  03/02/2021 12:06 PM  Katelyn Shelton  has presented today for surgery, with the diagnosis of history breast ca, acquired absence breasts.  The various methods of treatment have been discussed with the patient and family. After consideration of risks, benefits and other options for treatment, the patient has consented to  Procedure(s): REMOVAL LEFT CHEST IMPLANT (Left) as a surgical intervention.  The patient's history has been reviewed, patient examined, no change in status, stable for surgery.  I have reviewed the patient's chart and labs.  Questions were answered to the patient's satisfaction.     Katelyn Shelton Machael Raine

## 2021-03-02 NOTE — Anesthesia Procedure Notes (Signed)
Procedure Name: LMA Insertion Date/Time: 03/02/2021 12:29 PM Performed by: Signe Colt, CRNA Pre-anesthesia Checklist: Patient identified, Emergency Drugs available, Suction available and Patient being monitored Patient Re-evaluated:Patient Re-evaluated prior to induction Oxygen Delivery Method: Circle System Utilized Preoxygenation: Pre-oxygenation with 100% oxygen Induction Type: IV induction Ventilation: Mask ventilation without difficulty LMA: LMA inserted LMA Size: 4.0 Number of attempts: 1 Airway Equipment and Method: bite block Placement Confirmation: positive ETCO2 Tube secured with: Tape Dental Injury: Teeth and Oropharynx as per pre-operative assessment

## 2021-03-02 NOTE — Discharge Instructions (Signed)
     JP Drain [employer] this sheet to all of your post-operative appointments while you have your drains.  Please measure your drains by CC's or ML's.  Make sure you drain and measure your JP Drains 2 or 3 times per day.  At the end of each day, add up totals for the left side and add up totals for the right side.    ( 9 am )     ( 3 pm )        ( 9 pm )                Date L  R  L  R  L  R  Total L/R                                                                                                                                                                                       May take NSAIDS (Ibuprofen, Motrin) after 5:30pm, if needed.  May take Tylenol after 5:30pm, if needed.    Post Anesthesia Home Care Instructions  Activity: Get plenty of rest for the remainder of the day. A responsible individual must stay with you for 24 hours following the procedure.  For the next 24 hours, DO NOT: -Drive a car -Paediatric nurse -Drink alcoholic beverages -Take any medication unless instructed by your physician -Make any legal decisions or sign important papers.  Meals: Start with liquid foods such as gelatin or soup. Progress to regular foods as tolerated. Avoid greasy, spicy, heavy foods. If nausea and/or vomiting occur, drink only clear liquids until the nausea and/or vomiting subsides. Call your physician if vomiting continues.  Special Instructions/Symptoms: Your throat may feel dry or sore from the anesthesia or the breathing tube placed in your throat during surgery. If this causes discomfort, gargle with warm salt water. The discomfort should disappear within 24 hours.  If you had a scopolamine patch placed behind your ear for the management of post- operative nausea and/or vomiting:  1. The medication in the patch is effective for 72 hours, after which it should be removed.  Wrap patch in a tissue and discard in the trash. Wash hands thoroughly with soap and  water. 2. You may remove the patch earlier than 72 hours if you experience unpleasant side effects which may include dry mouth, dizziness or visual disturbances. 3. Avoid touching the patch. Wash your hands with soap and water after contact with the patch.

## 2021-03-02 NOTE — Anesthesia Preprocedure Evaluation (Signed)
Anesthesia Evaluation  Patient identified by MRN, date of birth, ID band Patient awake    Reviewed: Allergy & Precautions, NPO status , Patient's Chart, lab work & pertinent test results  Airway Mallampati: II  TM Distance: >3 FB Neck ROM: Full    Dental  (+) Teeth Intact, Dental Advisory Given   Pulmonary    breath sounds clear to auscultation       Cardiovascular  Rhythm:Regular Rate:Normal     Neuro/Psych    GI/Hepatic   Endo/Other    Renal/GU      Musculoskeletal   Abdominal   Peds  Hematology   Anesthesia Other Findings   Reproductive/Obstetrics                             Anesthesia Physical Anesthesia Plan  ASA: II  Anesthesia Plan: General   Post-op Pain Management:    Induction: Intravenous  PONV Risk Score and Plan: Ondansetron and Dexamethasone  Airway Management Planned: LMA  Additional Equipment:   Intra-op Plan:   Post-operative Plan:   Informed Consent: I have reviewed the patients History and Physical, chart, labs and discussed the procedure including the risks, benefits and alternatives for the proposed anesthesia with the patient or authorized representative who has indicated his/her understanding and acceptance.     Dental advisory given  Plan Discussed with: CRNA and Anesthesiologist  Anesthesia Plan Comments:         Anesthesia Quick Evaluation  

## 2021-03-02 NOTE — Op Note (Signed)
Operative Note   DATE OF OPERATION: 5.9.22  LOCATION: [facility]  SURGICAL DIVISION: Plastic Surgery  PREOPERATIVE DIAGNOSES:  1. History breast cancer 2. Acquired absence breasts 3. History therapeutic radiation  POSTOPERATIVE DIAGNOSES:  same  PROCEDURE:  Removal left chest implant  SURGEON: Irene Limbo MD MBA  ASSISTANT: none  ANESTHESIA:  General.   EBL: 10 ml  COMPLICATIONS: None immediate.   INDICATIONS FOR PROCEDURE:  The patient, Katelyn Shelton, is a 45 y.o. female born on [birth year], is here for removal left breast implant. Patient underwent bilateral prepectoral implant based reconstruction following mastectomies. Patient recently underwent removal right chest implant due to infection. Patient desires removal left breast implant.   FINDINGS: Removed intact smooth silicone implant. Very thin capsule present with no fluid in implant cavity.   DESCRIPTION OF PROCEDURE:  The patient's operative site was marked with the patient in the preoperative area. The patient was taken to the operating room. SCDs were placed and IV antibiotics were given. The patient's operative site was prepped and draped in a sterile fashion. A time out was performed and all information was confirmed to be correct. Incision made in left inframammary fold scar and carried through superficial fascia and acellular dermis. Implant removed. Partial excision anterior capsule completed to aid with adherence. Implant pocket irrigated with saline. 15 Fr JP placed percutaneously and secured with 2-0 nylon. Closure incision completed with 3-0 monocryl in superficial fascia, 4-0 vicryl in dermis, 4-0 monocryl subcuticular skin closure. Steri strips applied followed by dry dressing and breast binder.  The patient was allowed to wake from anesthesia, extubated and taken to the recovery room in satisfactory condition.   SPECIMENS: none  DRAINS: 15 Fr JP in left subcutaneous chest.

## 2021-03-02 NOTE — Transfer of Care (Signed)
Immediate Anesthesia Transfer of Care Note  Patient: Katelyn Shelton  Procedure(s) Performed: REMOVAL LEFT CHEST IMPLANT AND CAPSULECTOMY (Left Chest)  Patient Location: PACU  Anesthesia Type:General  Level of Consciousness: drowsy and patient cooperative  Airway & Oxygen Therapy: Patient Spontanous Breathing and Patient connected to face mask oxygen  Post-op Assessment: Report given to RN and Post -op Vital signs reviewed and stable  Post vital signs: Reviewed and stable  Last Vitals:  Vitals Value Taken Time  BP 132/85 03/02/21 1316  Temp    Pulse 81 03/02/21 1316  Resp 14 03/02/21 1316  SpO2 100 % 03/02/21 1316  Vitals shown include unvalidated device data.  Last Pain:  Vitals:   03/02/21 1114  TempSrc: Oral  PainSc: 0-No pain      Patients Stated Pain Goal: 4 (81/82/99 3716)  Complications: No complications documented.

## 2021-03-03 ENCOUNTER — Encounter (HOSPITAL_BASED_OUTPATIENT_CLINIC_OR_DEPARTMENT_OTHER): Payer: Self-pay | Admitting: Plastic Surgery

## 2021-03-05 ENCOUNTER — Ambulatory Visit: Payer: 59 | Admitting: Cardiology

## 2021-03-12 ENCOUNTER — Ambulatory Visit: Payer: 59 | Admitting: Infectious Disease

## 2021-03-12 LAB — ACID FAST CULTURE WITH REFLEXED SENSITIVITIES (MYCOBACTERIA): Acid Fast Culture: NEGATIVE

## 2021-03-16 ENCOUNTER — Other Ambulatory Visit (HOSPITAL_COMMUNITY): Payer: Self-pay

## 2021-03-16 ENCOUNTER — Other Ambulatory Visit: Payer: Self-pay | Admitting: Cardiology

## 2021-03-16 MED ORDER — REPATHA SURECLICK 140 MG/ML ~~LOC~~ SOAJ
140.0000 mg | SUBCUTANEOUS | 11 refills | Status: DC
  Filled 2021-03-16: qty 2, 28d supply, fill #0
  Filled 2021-05-04: qty 2, 28d supply, fill #1
  Filled 2021-06-08: qty 2, 28d supply, fill #2
  Filled 2021-06-22: qty 2, 28d supply, fill #3
  Filled 2021-07-23: qty 2, 28d supply, fill #4
  Filled 2021-08-10: qty 2, 28d supply, fill #5
  Filled 2021-09-23: qty 2, 28d supply, fill #6
  Filled 2021-10-20: qty 2, 28d supply, fill #7
  Filled 2021-11-09 – 2021-11-11 (×2): qty 2, 28d supply, fill #8
  Filled 2021-12-14: qty 2, 28d supply, fill #9
  Filled 2021-12-31 – 2022-01-06 (×2): qty 2, 28d supply, fill #10
  Filled 2022-02-09: qty 2, 28d supply, fill #11

## 2021-03-16 NOTE — Telephone Encounter (Signed)
PT IS CALLING TO GET A REFILL ON RX REPATHA HASNT HEARD BACK FROM THE NURSE.

## 2021-03-19 ENCOUNTER — Other Ambulatory Visit (HOSPITAL_COMMUNITY): Payer: Self-pay

## 2021-03-20 ENCOUNTER — Ambulatory Visit: Payer: 59 | Admitting: Cardiology

## 2021-03-20 ENCOUNTER — Other Ambulatory Visit (HOSPITAL_COMMUNITY): Payer: Self-pay

## 2021-03-24 ENCOUNTER — Other Ambulatory Visit (HOSPITAL_COMMUNITY): Payer: Self-pay

## 2021-03-25 ENCOUNTER — Other Ambulatory Visit (HOSPITAL_COMMUNITY): Payer: Self-pay

## 2021-03-25 MED ORDER — CARESTART COVID-19 HOME TEST VI KIT
PACK | 0 refills | Status: DC
  Filled 2021-03-25: qty 4, 4d supply, fill #0

## 2021-04-28 ENCOUNTER — Other Ambulatory Visit: Payer: Self-pay | Admitting: Oncology

## 2021-05-01 ENCOUNTER — Other Ambulatory Visit: Payer: Self-pay | Admitting: *Deleted

## 2021-05-01 ENCOUNTER — Other Ambulatory Visit (HOSPITAL_COMMUNITY): Payer: Self-pay

## 2021-05-01 MED ORDER — BUPROPION HCL ER (XL) 300 MG PO TB24
300.0000 mg | ORAL_TABLET | Freq: Every day | ORAL | 6 refills | Status: DC
  Filled 2021-05-01 – 2021-05-21 (×2): qty 30, 30d supply, fill #0
  Filled 2021-06-22: qty 90, 90d supply, fill #1

## 2021-05-01 MED ORDER — BUPROPION HCL ER (XL) 150 MG PO TB24
150.0000 mg | ORAL_TABLET | Freq: Every day | ORAL | 0 refills | Status: DC
  Filled 2021-05-01: qty 20, 20d supply, fill #0

## 2021-05-01 MED ORDER — VENLAFAXINE HCL 37.5 MG PO TABS
37.5000 mg | ORAL_TABLET | Freq: Two times a day (BID) | ORAL | 0 refills | Status: DC
  Filled 2021-05-01: qty 30, 15d supply, fill #0

## 2021-05-04 ENCOUNTER — Other Ambulatory Visit (HOSPITAL_COMMUNITY): Payer: Self-pay

## 2021-05-05 ENCOUNTER — Other Ambulatory Visit (HOSPITAL_COMMUNITY): Payer: Self-pay

## 2021-05-13 ENCOUNTER — Other Ambulatory Visit (HOSPITAL_COMMUNITY): Payer: Self-pay

## 2021-05-13 MED FILL — Exemestane Tab 25 MG: ORAL | 90 days supply | Qty: 90 | Fill #0 | Status: AC

## 2021-05-18 ENCOUNTER — Other Ambulatory Visit (HOSPITAL_COMMUNITY): Payer: Self-pay

## 2021-05-18 DIAGNOSIS — C50411 Malignant neoplasm of upper-outer quadrant of right female breast: Secondary | ICD-10-CM | POA: Diagnosis not present

## 2021-05-18 DIAGNOSIS — Z170 Estrogen receptor positive status [ER+]: Secondary | ICD-10-CM | POA: Diagnosis not present

## 2021-05-21 ENCOUNTER — Other Ambulatory Visit (HOSPITAL_COMMUNITY): Payer: Self-pay

## 2021-05-22 NOTE — Progress Notes (Deleted)
Cardiology Office Note:    Date:  05/22/2021   ID:  Katelyn Shelton, DOB 10-08-1976, MRN [id number]  PCP:  Marin Olp, MD   [facility] Providers Cardiologist:  None {   Referring MD: Marin Olp, MD     History of Present Illness:    Katelyn Shelton is a 45 y.o. female with a hx of HLD and ER+ breast cancer who was previously followed by Dr. Meda Coffee who now presents to clinic for follow-up.  Per review of the record, the patient was diagnosed with metastatic breast cancer in the right breast, estrogen positive in 2018. She was started on anastrozole (Arimidex) in December 2019, ovarian suppression started in October 2018 with monthly goserelin (Zoladex), bone density 11/25/17 shows osteopenia with t score -2.0 and now on denosumab (Prolia) for osteoporosis since March 2019.   She has FH of hyperlipidemia in her mother. Her maternal grandfather had MI, he was a smoker. She has known hyperlipidemia since at least 2010. She was started on Crestor in October 2019 that caused significant neuropathy and was discontinued. Livalo was tried in March 2019 but caused burning of the left thigh to the point that she had to limit her exercise. She was also experiencing significant morning stiffness. It gets better with motion and exercise. She is also experiencing generalized joint pain. All these symptoms  new and started after she was treated for breast cancer. Was transitioned to repatha.   Last saw Dr. Meda Coffee in 2019 where she was doing better on the repatha. Was continuing on anastrozole and Prolia. Ca score 03/2018 0.   Past Medical History:  Diagnosis Date   Allergy    Anxiety    venflafaxine XR 75 mg   Breast cancer ([place]) 01/2017   right; genetic testing negative in 2018 with [employer] panel   Heart murmur    as child- went away   History of MRSA infection    elbow   Hyperlipidemia    Infection of breast implant ([place]) 01/19/2021   Joint pain    due to medicine induced joint  pain   Migraine    otc med prn   Miscarriage    x1   MRSA infection 01/28/2021   Nausea and vomiting 01/19/2021   Neuromuscular disorder (HCC)    neuropathy bilater feet - ? r/t chemo   Osteopenia    UTI (urinary tract infection)     Past Surgical History:  Procedure Laterality Date   AREOLA/NIPPLE RECONSTRUCTION WITH GRAFT Right 12/01/2018   Procedure: RIGHT NIPPLE AREOLA COMPLEX RECONSTRUCTION WITH FULL THICKNESS SKIN GRAFT FROM RIGHT GROIN;  Surgeon: Irene Limbo, MD;  Location: [place];  Service: Plastics;  Laterality: Right;   BREAST IMPLANT REMOVAL Right 01/20/2021   Procedure: REMOVAL RIGHT BREAST IMPLANT;  Surgeon: Irene Limbo, MD;  Location: [place];  Service: Plastics;  Laterality: Right;   BREAST IMPLANT REMOVAL Left 03/02/2021   Procedure: REMOVAL LEFT CHEST IMPLANT AND CAPSULECTOMY;  Surgeon: Irene Limbo, MD;  Location: [place];  Service: Plastics;  Laterality: Left;   BREAST RECONSTRUCTION Left 12/01/2018   Procedure: LEFT BREAST REVISION RECONSTRUCTION WITH SKIN EXCISION;  Surgeon: Irene Limbo, MD;  Location: [place];  Service: Plastics;  Laterality: Left;   BREAST RECONSTRUCTION Left 12/01/2018   Procedure: COMPOSITE GRAFT FROM LEFT NIPPLE (NIPPLE SHARING);  Surgeon: Irene Limbo, MD;  Location: [place];  Service: Plastics;  Laterality: Left;   BREAST RECONSTRUCTION WITH PLACEMENT  OF TISSUE EXPANDER AND FLEX HD (ACELLULAR HYDRATED DERMIS) Bilateral 02/28/2017   Procedure: BILATERAL BREAST RECONSTRUCTION WITH PLACEMENT OF TISSUE EXPANDER AND ALLODERM;  Surgeon: Irene Limbo, MD;  Location: [place];  Service: Plastics;  Laterality: Bilateral;   CESAREAN SECTION  2008,2010,2013   x 3   CESAREAN SECTION  02/04/2012   Procedure: CESAREAN SECTION;  Surgeon: Cyril Mourning, MD;  Location: [facility];  Service: Gynecology;  Laterality: N/A;  Repeat Cesarean Section Delivery   Boy  @  [phone], Apgars 9/10   COLONOSCOPY     polyp   DILATION AND CURETTAGE OF UTERUS     EXCISION OF BREAST LESION Right 03/15/2017   Procedure: RIGHT NIPPLE AND AREOLA EXCISION;  Surgeon: Rolm Bookbinder, MD;  Location: [place];  Service: General;  Laterality: Right;   LAPAROSCOPIC VAGINAL HYSTERECTOMY WITH SALPINGO OOPHORECTOMY Bilateral 06/29/2018   Procedure: LAPAROSCOPIC ASSISTED VAGINAL HYSTERECTOMY WITH SALPINGO OOPHORECTOMY;  Surgeon: Louretta Shorten, MD;  Location: [facility];  Service: Gynecology;  Laterality: Bilateral;  Request case for [facility] CRNA   LIPOSUCTION WITH LIPOFILLING N/A 07/25/2018   Procedure: LIPOSUCTION WITH LIPOFILLING FROM ABDOMEN TO CHEST;  Surgeon: Irene Limbo, MD;  Location: [place];  Service: Plastics;  Laterality: N/A;   LIPOSUCTION WITH LIPOFILLING Bilateral 12/01/2018   Procedure: LIPOFILLING TO BILATERAL CHEST REVISION;  Surgeon: Irene Limbo, MD;  Location: [place];  Service: Plastics;  Laterality: Bilateral;   NIPPLE SPARING MASTECTOMY Bilateral 02/28/2017   Procedure: RIGHT NIPPLE SPARING MASTECTOMY; LEFT PROPHYLACTIC NIPPLE SPARING MASTECTOMY;  Surgeon: Rolm Bookbinder, MD;  Location: [place];  Service: General;  Laterality: Bilateral;   PECTUS EXCAVATUM REPAIR  1763   45 years old- very large implant   REMOVAL OF TISSUE EXPANDER AND PLACEMENT OF IMPLANT Bilateral 07/25/2018   Procedure: REMOVAL OF TISSUE EXPANDER AND PLACEMENT OF SILICONE IMPLANT;  Surgeon: Irene Limbo, MD;  Location: [place];  Service: Plastics;  Laterality: Bilateral;   SENTINEL NODE BIOPSY Right 02/28/2017   Procedure: RIGHT AXILLARY SENTINEL LYMPH  NODE BIOPSY;  Surgeon: Rolm Bookbinder, MD;  Location: [place];  Service: General;  Laterality: Right;   VULVAR LESION REMOVAL     WISDOM TOOTH EXTRACTION      Current Medications: No outpatient medications have been  marked as taking for the 05/28/21 encounter (Appointment) with Freada Bergeron, MD.     Allergies:   Fentanyl, Peach flavor, Penicillins, Sulfa antibiotics, Sulfasalazine, and Latex   Social History   Socioeconomic History   Marital status: Married    Spouse name: [name]   Number of children: Not on file   Years of education: Not on file   Highest education level: Not on file  Occupational History   Not on file  Tobacco Use   Smoking status: Never   Smokeless tobacco: Never  Vaping Use   Vaping Use: Never used  Substance and Sexual Activity   Alcohol use: Yes    Alcohol/week: 1.0 - 2.0 standard drink    Types: 1 - 2 Standard drinks or equivalent per week    Comment: occasional wine - twice monthly   Drug use: No   Sexual activity: Yes    Birth control/protection: Surgical  Other Topics Concern   Not on file  Social History Narrative   Married to Dr. Aundra Dubin. 3 children.    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    Masters in public history at [employer]. Undergrad at [employer]: running, reading, exercise   Social Determinants of Radio broadcast assistant Strain: Not on file  Food Insecurity: Not on file  Transportation Needs: Not on file  Physical Activity: Not on file  Stress: Not on file  Social Connections: Not on file     Family History: The patient's ***family history includes Alzheimer's disease in her paternal grandfather; Breast cancer in her cousin; Colon polyps in her maternal uncle, mother, and sister; Depression in her maternal grandfather; Diabetes in her maternal aunt; Early death in her maternal grandfather; Heart attack in her maternal grandfather; Heart failure in her maternal grandmother; Hyperlipidemia in her father and mother; Hypertension in her mother; Miscarriages / Stillbirths in her sister; Parkinson's disease (age of onset: 78) in her sister; Prostate cancer in her paternal grandfather; Skin  cancer in her mother; Stomach cancer in her paternal grandmother; Stroke in her maternal grandmother. There is no history of Colon cancer or Rectal cancer.  ROS:   Please see the history of present illness.    *** All other systems reviewed and are negative.  EKGs/Labs/Other Studies Reviewed:    The following studies were reviewed today: CTA chest 09/2018: FINDINGS: Cardiovascular: The heart is normal in size. No pericardial effusion.   No evidence of thoracic aortic aneurysm.   Mediastinum/Nodes: No suspicious mediastinal or axillary lymphadenopathy.   Visualized thyroid is unremarkable.   Lungs/Pleura: Subpleural nodular scarring in the anterolateral right upper lobe (for example, series 3/images 39 and 45) and lateral right middle lobe (series 3/images 50 and 53). Visualized portions are essentially unchanged from the prior, which is reassuring, and the appearance is not considered suspicious for metastases. Given lack of interval change, this is also unlikely to reflect infection/inflammation. Overall, the appearance/distribution is most compatible with radiation changes given the clinical history.   Otherwise, no suspicious pulmonary nodules.  No focal consolidation.   No pleural effusions or pneumothorax.   Upper Abdomen: Visualized upper abdomen is unremarkable.   Musculoskeletal: Status post bilateral mastectomy with reconstruction.   Visualized osseous structures are within normal limits.   IMPRESSION: Subpleural nodular scarring in the anterolateral right upper and middle lobes, unchanged from the prior, compatible with radiation changes.   Status post bilateral mastectomy. No findings suspicious for recurrent or metastatic disease.  CT pelvis: 02/21/2018 IMPRESSION: 1. A cause for the patient's symptoms is not identified. 2. Sclerotic lesions in the right iliac bone and right acetabulum are stable from 2015 and accordingly unlikely to represent  active malignancy. 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 3. Bilateral common iliac artery atherosclerotic calcification.   Calcium score: 03/30/2018 Calcium score is 0. IMPRESSION: Clustered nodules in the inferior right upper lobe and adjacent superior right middle lobe, likely inflammatory.  EKG:  EKG is *** ordered today.  The ekg ordered today demonstrates ***  Recent Labs: 01/15/2021: ALT 68 01/20/2021: BUN 9; Creatinine, Ser 0.63; Potassium 3.3; Sodium 136 01/22/2021: Hemoglobin 12.3; Platelets 262  Recent Lipid Panel    Component Value Date/Time   CHOL 111 11/18/2020 1136   CHOL 154 06/22/2018 0817   TRIG 150.0 (H) 11/18/2020 1136   HDL 51.10 11/18/2020 1136   HDL 57 06/22/2018 0817   CHOLHDL 2 11/18/2020 1136   VLDL 30.0 11/18/2020  [place] 11/18/2020 1136   LDLCALC 75 06/22/2018 0817     Risk Assessment/Calculations:   {Does this patient have ATRIAL FIBRILLATION?:[phone]}       Physical Exam:    VS:  LMP 09/01/2017     Wt Readings from Last 3 Encounters:  03/02/21 146 lb 13.2 oz (66.6 kg)  02/03/21 149 lb (67.6 kg)  01/15/21 149 lb 4.8 oz (67.7 kg)     GEN: *** Well nourished, well developed in no acute distress HEENT: Normal NECK: No JVD; No carotid bruits LYMPHATICS: No lymphadenopathy CARDIAC: ***RRR, no murmurs, rubs, gallops RESPIRATORY:  Clear to auscultation without rales, wheezing or rhonchi  ABDOMEN: Soft, non-tender, non-distended MUSCULOSKELETAL:  No edema; No deformity  SKIN: Warm and dry NEUROLOGIC:  Alert and oriented x 3 PSYCHIATRIC:  Normal affect   ASSESSMENT:    No diagnosis found. PLAN:    In order of problems listed above:  #Familial HLD: #Statin Intolerant: LDL 178, LDL particles 1948, TG 215, Apo LP B 141. The patient has severe side  effects to Crestor and Livalo, she also has evidence for PAD -calcifications of the iliac arteries now transitioned to [place] well with excellent improvement in her lipids. -Continue repatha  #ER+ Mestastatic Breast Cancer: In remission. On **** -?TTE with strain   {Are you ordering a CV Procedure (e.g. stress test, cath, DCCV, TEE, etc)?   Press F2        :[id number]    Medication Adjustments/Labs and Tests Ordered: Current medicines are reviewed at length with the patient today.  Concerns regarding medicines are outlined above.  No orders of the defined types were placed in this encounter.  No orders of the defined types were placed in this encounter.   There are no Patient Instructions on file for this visit.   Signed, Freada Bergeron, MD  05/22/2021 7:51 AM    [facility]

## 2021-05-25 ENCOUNTER — Other Ambulatory Visit (HOSPITAL_COMMUNITY): Payer: Self-pay

## 2021-05-28 ENCOUNTER — Ambulatory Visit: Payer: 59 | Admitting: Cardiology

## 2021-06-08 ENCOUNTER — Ambulatory Visit: Payer: 59 | Admitting: Orthopedic Surgery

## 2021-06-08 ENCOUNTER — Other Ambulatory Visit (HOSPITAL_COMMUNITY): Payer: Self-pay

## 2021-06-22 ENCOUNTER — Other Ambulatory Visit (HOSPITAL_COMMUNITY): Payer: Self-pay

## 2021-06-26 ENCOUNTER — Ambulatory Visit
Admission: RE | Admit: 2021-06-26 | Discharge: 2021-06-26 | Disposition: A | Discharge status: Home / Self Care | Payer: 59 | Source: Ambulatory Visit | Attending: General Surgery | Admitting: General Surgery

## 2021-06-26 ENCOUNTER — Other Ambulatory Visit (HOSPITAL_COMMUNITY): Payer: Self-pay

## 2021-06-26 ENCOUNTER — Other Ambulatory Visit: Payer: Self-pay | Admitting: General Surgery

## 2021-06-26 ENCOUNTER — Other Ambulatory Visit: Payer: Self-pay

## 2021-06-26 DIAGNOSIS — N6310 Unspecified lump in the right breast, unspecified quadrant: Secondary | ICD-10-CM

## 2021-06-26 DIAGNOSIS — N644 Mastodynia: Secondary | ICD-10-CM | POA: Diagnosis not present

## 2021-06-27 ENCOUNTER — Other Ambulatory Visit: Payer: Self-pay | Admitting: Family Medicine

## 2021-06-27 MED ORDER — BUPROPION HCL ER (XL) 300 MG PO TB24: 300.0000 mg | ORAL_TABLET | Freq: Every day | ORAL | 6 refills | Status: DC

## 2021-06-27 MED ORDER — BUPROPION HCL ER (XL) 300 MG PO TB24: 300.0000 mg | ORAL_TABLET | Freq: Every day | ORAL | 0 refills | Status: DC

## 2021-06-30 ENCOUNTER — Other Ambulatory Visit: Payer: Self-pay | Admitting: Oncology

## 2021-06-30 ENCOUNTER — Telehealth: Payer: Self-pay

## 2021-06-30 ENCOUNTER — Other Ambulatory Visit (HOSPITAL_COMMUNITY): Payer: Self-pay

## 2021-06-30 MED ORDER — BUPROPION HCL ER (XL) 300 MG PO TB24
300.0000 mg | ORAL_TABLET | Freq: Every day | ORAL | 6 refills | Status: DC
  Filled 2021-09-23: qty 30, 30d supply, fill #0

## 2021-06-30 NOTE — Telephone Encounter (Signed)
Nurse Assessment Nurse: Kellie Simmering, RN, Manuela Schwartz Date/Time Katelyn Shelton Time): 06/27/2021 10:50:44 AM Confirm and document reason for call. If symptomatic, describe symptoms. ---Caller states she is out of town and forgot her Wellbutrin 300 mg, reports she forgot it and it was not filled by this PCP group. Her oncologist prescribed. Caller is out of town until Tuesday. Does the patient have any new or worsening symptoms? ---No Nurse: Kellie Simmering, RN, Manuela Schwartz Date/Time Katelyn Shelton Time): 06/27/2021 10:53:14 AM Please select the assessment type ---Refill Additional Documentation ---Caller forgot med at home Does the patient have enough medication to last until the office opens? ---Unable to obtain loaner dose from Pharmacy Does the client directives allow for assistance with medications after hours? ---No Additional Documentation ---Caller instructed to call oncologist office for refill, verbalized understanding Disp. Time Katelyn Shelton Time) Disposition Final User 06/27/2021 10:29:27 AM Send To RN Personal Stephanie Coup, RN, [facility] 06/27/2021 10:54:16 AM Clinical Call Yes Kellie Simmering, RN, Manuela Schwartz

## 2021-07-05 NOTE — Progress Notes (Signed)
Office Visit    Patient Name: Katelyn Shelton Date of Encounter: 07/06/2021  PCP:  Marin Olp, MD   [place]  Cardiologist:  Freada Bergeron, MD  Advanced Practice Provider:  No care team member to display Electrophysiologist:  None     Chief Complaint    Katelyn Shelton is a 45 y.o. female with a hx of hyperlipidemia, breast cancer presents today for follow up of hyperlipidemia  Past Medical History    Past Medical History:  Diagnosis Date   Allergy    Anxiety    venflafaxine XR 75 mg   Breast cancer ([place]) 01/2017   right; genetic testing negative in 2018 with [employer] panel   Heart murmur    as child- went away   History of MRSA infection    elbow   Hyperlipidemia    Infection of breast implant ([place]) 01/19/2021   Joint pain    due to medicine induced joint pain   Migraine    otc med prn   Miscarriage    x1   MRSA infection 01/28/2021   Nausea and vomiting 01/19/2021   Neuromuscular disorder ([place])    neuropathy bilater feet - ? r/t chemo   Osteopenia    UTI (urinary tract infection)    Past Surgical History:  Procedure Laterality Date   AREOLA/NIPPLE RECONSTRUCTION WITH GRAFT Right 12/01/2018   Procedure: RIGHT NIPPLE AREOLA COMPLEX RECONSTRUCTION WITH FULL THICKNESS SKIN GRAFT FROM RIGHT GROIN;  Surgeon: Irene Limbo, MD;  Location: [place];  Service: Plastics;  Laterality: Right;   BREAST IMPLANT REMOVAL Right 01/20/2021   Procedure: REMOVAL RIGHT BREAST IMPLANT;  Surgeon: Irene Limbo, MD;  Location: [place];  Service: Plastics;  Laterality: Right;   BREAST IMPLANT REMOVAL Left 03/02/2021   Procedure: REMOVAL LEFT CHEST IMPLANT AND CAPSULECTOMY;  Surgeon: Irene Limbo, MD;  Location: [place];  Service: Plastics;  Laterality: Left;   BREAST RECONSTRUCTION Left 12/01/2018   Procedure: LEFT BREAST REVISION RECONSTRUCTION WITH SKIN EXCISION;  Surgeon: Irene Limbo, MD;  Location: [place];  Service: Plastics;  Laterality: Left;   BREAST RECONSTRUCTION Left 12/01/2018   Procedure: COMPOSITE GRAFT FROM LEFT NIPPLE (NIPPLE SHARING);  Surgeon: Irene Limbo, MD;  Location: [place];  Service: Plastics;  Laterality: Left;   BREAST RECONSTRUCTION WITH PLACEMENT OF TISSUE EXPANDER AND FLEX HD (ACELLULAR HYDRATED DERMIS) Bilateral 02/28/2017   Procedure: BILATERAL BREAST RECONSTRUCTION WITH PLACEMENT OF TISSUE EXPANDER AND ALLODERM;  Surgeon: Irene Limbo, MD;  Location: [place];  Service: Plastics;  Laterality: Bilateral;   CESAREAN SECTION  2008,2010,2013   x 3   CESAREAN SECTION  02/04/2012   Procedure: CESAREAN SECTION;  Surgeon: Cyril Mourning, MD;  Location: [facility];  Service: Gynecology;  Laterality: N/A;  Repeat Cesarean Section Delivery  Boy  @  [id number], Apgars 9/10   COLONOSCOPY     polyp   DILATION AND CURETTAGE OF UTERUS     EXCISION OF BREAST LESION Right 03/15/2017   Procedure: RIGHT NIPPLE AND AREOLA EXCISION;  Surgeon: Rolm Bookbinder, MD;  Location: [place];  Service: General;  Laterality: Right;   LAPAROSCOPIC VAGINAL HYSTERECTOMY WITH SALPINGO OOPHORECTOMY Bilateral 06/29/2018   Procedure: LAPAROSCOPIC ASSISTED VAGINAL HYSTERECTOMY WITH SALPINGO OOPHORECTOMY;  Surgeon: Louretta Shorten, MD;  Location: [facility];  Service: Gynecology;  Laterality: Bilateral;  Request case for [facility] CRNA   LIPOSUCTION WITH LIPOFILLING N/A 07/25/2018  Procedure: LIPOSUCTION WITH LIPOFILLING FROM ABDOMEN TO CHEST;  Surgeon: Irene Limbo, MD;  Location: [place];  Service: Plastics;  Laterality: N/A;   LIPOSUCTION WITH LIPOFILLING Bilateral 12/01/2018   Procedure: LIPOFILLING TO BILATERAL CHEST REVISION;  Surgeon: Irene Limbo, MD;  Location: [place];  Service: Plastics;  Laterality: Bilateral;   NIPPLE SPARING MASTECTOMY Bilateral 02/28/2017   Procedure: RIGHT NIPPLE SPARING  MASTECTOMY; LEFT PROPHYLACTIC NIPPLE SPARING MASTECTOMY;  Surgeon: Rolm Bookbinder, MD;  Location: [place];  Service: General;  Laterality: Bilateral;   PECTUS EXCAVATUM REPAIR  3715   45 years old- very large implant   REMOVAL OF TISSUE EXPANDER AND PLACEMENT OF IMPLANT Bilateral 07/25/2018   Procedure: REMOVAL OF TISSUE EXPANDER AND PLACEMENT OF SILICONE IMPLANT;  Surgeon: Irene Limbo, MD;  Location: [place];  Service: Plastics;  Laterality: Bilateral;   SENTINEL NODE BIOPSY Right 02/28/2017   Procedure: RIGHT AXILLARY SENTINEL LYMPH  NODE BIOPSY;  Surgeon: Rolm Bookbinder, MD;  Location: [facility];  Service: General;  Laterality: Right;   VULVAR LESION REMOVAL     WISDOM TOOTH EXTRACTION      Allergies  Allergies  Allergen Reactions   Fentanyl Itching    Severe itching - Patient refuses this medication   Peach Flavor Anaphylaxis   Penicillins Hives    AS A CHILD   Sulfa Antibiotics Nausea And Vomiting   Sulfasalazine Nausea And Vomiting and Other (See Comments)   Latex Itching    History of Present Illness    Katelyn Shelton is a 45 y.o. female with a hx of hyperlipidemia, breast cancer, calcification of iliac artery last seen 06/2018 by Dr. Meda Coffee.  She was diagnosed with metastatic breast cancer in the right breast, estrogen positive in 2018.  Treated with anastrozole (Arimidex), Goserelin (Zoladex) and subsequently  Denosumab (Prolia) for osteoporosis. Family history of hyperlipidemia in her mother and maternal grandfather had MI. Previous intolerance to Crestor with significant neuropathy, Livalo with burning to the thigh.  She was started on Repatha which she tolerates well. Previous calcium score in 2019 of 0.  CT pelvis April 2019 with bilateral common iliac artery atherosclerotic calcification.  She had bilateral breast implants explanted 03/02/21 due to right breast cellulitis with fevers and erythema. Cultures were  consistent with MRSA.   She presents today for follow up. Reports healing overall well from her recent surgery with some mild incisional soreness at previous drain sites. Reports understandable frustration with weight gain in setting of breast cancer treatments, hysterectomy. Feels weight loss is slow despite restricting carbohydrates and exercising regularly at [facility]. Wonders whether she would be a candidate for [employer] as she has a family history of DM2 which she would like to avoid. Reports no shortness of breath nor dyspnea on exertion. Reports no chest pain, pressure, or tightness. No edema, orthopnea, PND. Reports no palpitations.   EKGs/Labs/Other Studies Reviewed:   The following studies were reviewed today:  CT pelvis: 02/21/2018 IMPRESSION: 1. A cause for the patient's symptoms is not identified. 2. Sclerotic lesions in the right iliac bone and right acetabulum are stable from 2015 and accordingly unlikely to represent active malignancy. 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 3. Bilateral common iliac artery atherosclerotic calcification.   Calcium score: 03/30/2018 Calcium score is 0. IMPRESSION: Clustered nodules in the  inferior right upper lobe and adjacent superior right middle lobe, likely inflammatory.    EKG:  EKG is ordered today.  The ekg ordered today demonstrates NSR 72 bpm with occasional PAC. No acute ST/T wave changes  Recent Labs: 01/15/2021: ALT 68 01/20/2021: BUN 9; Creatinine, Ser 0.63; Potassium 3.3; Sodium 136 01/22/2021: Hemoglobin 12.3; Platelets 262  Recent Lipid Panel    Component Value Date/Time   CHOL 111 11/18/2020 1136   CHOL 154 06/22/2018 0817   TRIG 150.0 (H) 11/18/2020 1136   HDL 51.10 11/18/2020 1136   HDL 57 06/22/2018 0817   CHOLHDL 2  11/18/2020 1136   VLDL 30.0 11/18/2020 1136   LDLCALC 30 11/18/2020 1136   LDLCALC 75 06/22/2018 0817    Home Medications   Current Meds  Medication Sig   buPROPion (WELLBUTRIN XL) 300 MG 24 hr tablet Take 1 tablet (300 mg total) by mouth daily.   buPROPion (WELLBUTRIN XL) 300 MG 24 hr tablet Take 1 tablet (300 mg total) by mouth daily.   Calcium Carbonate (CALCIUM 600 PO) Take 1 capsule by mouth daily.   COVID-19 At Home Antigen Test [facility] COVID-19 HOME TEST) KIT Use as directed   denosumab (PROLIA) 60 MG/ML SOSY injection Inject 60 mg into the skin every 6 (six) months.   doxycycline (VIBRA-TABS) 100 MG tablet Take 1 tablet (100 mg total) by mouth 2 (two) times daily.   Evolocumab (REPATHA SURECLICK) 588 MG/ML SOAJ Inject 140 mg (1 pen) into the skin every 14 (fourteen) days.   exemestane (AROMASIN) 25 MG tablet TAKE 1 TABLET (25 MG TOTAL) BY MOUTH DAILY AFTER BREAKFAST.   Multiple Vitamins-Minerals (MULTIVITAMIN WOMEN 50+) TABS Take 1 tablet by mouth daily.     Review of Systems      All other systems reviewed and are otherwise negative except as noted above.  Physical Exam    VS:  BP 112/72   Pulse 72   Ht 5' 1" (1.549 m)   Wt 140 lb 6.4 oz (63.7 kg)   LMP 09/01/2017   SpO2 97%   BMI 26.53 kg/m  , BMI Body mass index is 26.53 kg/m.  Wt Readings from Last 3 Encounters:  07/06/21 140 lb 6.4 oz (63.7 kg)  03/02/21 146 lb 13.2 oz (66.6 kg)  02/03/21 149 lb (67.6 kg)    GEN: Well nourished, overweight, well developed, in no acute distress. HEENT: normal. Neck: Supple, no JVD, carotid bruits, or masses. Cardiac: RRR, no murmurs, rubs, or gallops. No clubbing, cyanosis, edema.  Radials/PT 2+ and equal bilaterally.  Respiratory:  Respirations regular and unlabored, clear to auscultation bilaterally. GI: Soft, nontender, nondistended. MS: No deformity or atrophy. Skin: Warm and dry, no rash. Neuro:  Strength and sensation are intact. Psych: Normal  affect.  Assessment & Plan   Familial hyperlipidemia / Statin intolerance -Intolerance to Crestor and Livalo. 11/18/20 total cholesterol 111, HDL 51, LDL 30, triglycerides 150. Tolerates Repatha without difficulty. LDL at goal of <70, ideally <55. May be interested in switching from pen to push button Repatha system but she will update [place] if new Rx needed. Heart healthy diet and regular cardiovascular exercise encouraged.    PAD -calcification of the iliac arteries. No claudication symptoms and no indication for ABI. Continue optimal cholesterol management, as above. Heart healthy diet and regular cardiovascular exercise encouraged.    Overweight - BMI 27%. Reports difficulty losing weight despite low carb diet and regular exercise. Given known peripheral arterial disease, familial hyperlipidemia, strong family history of  DM2, and history of breast cancer - healthy wait is imperative to her cardiovascular health. She is interested in semaglutide. Will Rx Ozempic.  Breast cancer - Continue to follow with Dr. Jana Hakim  [facility] [place] [facility] - Noted by EKG today. Asymptomatic with no palpitations. No indication for further workup.   Disposition: Follow up in 1 year(s) with Dr. Johney Frame or APP.  Signed, Loel Dubonnet, NP 07/06/2021, 9:23 AM [place]

## 2021-07-06 ENCOUNTER — Other Ambulatory Visit (HOSPITAL_COMMUNITY): Payer: Self-pay

## 2021-07-06 ENCOUNTER — Encounter (HOSPITAL_BASED_OUTPATIENT_CLINIC_OR_DEPARTMENT_OTHER): Payer: Self-pay | Admitting: Family

## 2021-07-06 ENCOUNTER — Other Ambulatory Visit: Payer: Self-pay

## 2021-07-06 ENCOUNTER — Ambulatory Visit (HOSPITAL_BASED_OUTPATIENT_CLINIC_OR_DEPARTMENT_OTHER): Payer: 59 | Admitting: Family

## 2021-07-06 ENCOUNTER — Other Ambulatory Visit: Payer: Self-pay | Admitting: Oncology

## 2021-07-06 VITALS — BP 112/72 | HR 72 | Ht 61.0 in | Wt 140.4 lb

## 2021-07-06 DIAGNOSIS — Z170 Estrogen receptor positive status [ER+]: Secondary | ICD-10-CM | POA: Diagnosis not present

## 2021-07-06 DIAGNOSIS — Z789 Other specified health status: Secondary | ICD-10-CM

## 2021-07-06 DIAGNOSIS — E663 Overweight: Secondary | ICD-10-CM

## 2021-07-06 DIAGNOSIS — C50411 Malignant neoplasm of upper-outer quadrant of right female breast: Secondary | ICD-10-CM

## 2021-07-06 DIAGNOSIS — I491 Atrial premature depolarization: Secondary | ICD-10-CM | POA: Diagnosis not present

## 2021-07-06 DIAGNOSIS — Z833 Family history of diabetes mellitus: Secondary | ICD-10-CM | POA: Diagnosis not present

## 2021-07-06 DIAGNOSIS — E7849 Other hyperlipidemia: Secondary | ICD-10-CM

## 2021-07-06 DIAGNOSIS — I739 Peripheral vascular disease, unspecified: Secondary | ICD-10-CM

## 2021-07-06 MED ORDER — OZEMPIC (0.25 OR 0.5 MG/DOSE) 2 MG/1.5ML ~~LOC~~ SOPN
PEN_INJECTOR | SUBCUTANEOUS | 2 refills | Status: DC
  Filled 2021-07-06: qty 1.5, 42d supply, fill #0
  Filled 2021-08-10 – 2021-08-11 (×2): qty 3, 56d supply, fill #1
  Filled 2021-08-12: qty 1.5, 42d supply, fill #1

## 2021-07-06 NOTE — Progress Notes (Signed)
I called Katelyn Shelton and left her voicemail regarding the issue between Prolia and Zometa.  I am switching her to an infusion on 07/08/2021

## 2021-07-06 NOTE — Patient Instructions (Addendum)
Medication Instructions:  Your physician has recommended you make the following change in your medication:   START Semaglutide (Ozempic)  Week 1 through week 4 : 0.25 mg once weekly. Week 5 through week 8: 0.5 mg once weekly. Week 9 through week 12: 1 mg once weekly. Week 13 through week 16: 1.7 mg once weekly. Week 17 and thereafter 2.4 mg once weekly *we have sent in the prescription for week 1-8. Please check in with [place] via MyChart a few weeks after starting to ensure no side effects and we will send in the remainder at that time.  If you wish to switch to Repatha push-system simply let us know.   *If you need a refill on your cardiac medications before your next appointment, please call your pharmacy*  Lab Work: None ordered today.  Testing/Procedures: Your EKG today sows norma. Sinus rhythm with occasional early beats called PAC's.   Follow-Up: At [facility], you and your health needs are our priority.  As part of our continuing mission to provide you with exceptional heart care, we have created designated Provider Care Teams.  These Care Teams include your primary Cardiologist (physician) and Advanced Practice Providers (APPs -  Physician Assistants and Nurse Practitioners) who all work together to provide you with the care you need, when you need it.  We recommend signing up for the patient portal called "MyChart".  Sign up information is provided on this After Visit Summary.  MyChart is used to connect with patients for Virtual Visits (Telemedicine).  Patients are able to view lab/test results, encounter notes, upcoming appointments, etc.  Non-urgent messages can be sent to your provider as well.   To learn more about what you can do with MyChart, go to [email or web address].    Your next appointment:   1 year(s)  The format for your next appointment:   In Person  Provider:   You may see Freada Bergeron, MD or one of the following Advanced Practice Providers on  your designated Care Team:   Richardson Dopp, PA-C [place] , [place]  Other Instructions  Heart Healthy Diet Recommendations: A low-salt diet is recommended. Meats should be grilled, baked, or boiled. Avoid fried foods. Focus on lean protein sources like fish or chicken with vegetables and fruits. The American Heart Association is a Microbiologist!  Exercise recommendations: The American Heart Association recommends 150 minutes of moderate intensity exercise weekly. Try 30 minutes of moderate intensity exercise 4-5 times per week. This could include walking, jogging, or swimming.   Semaglutide Injection What is this medication? SEMAGLUTIDE (SEM a GLOO tide) treats type 2 diabetes. It works by increasing insulin levels in your body, which decreases your blood sugar (glucose). It also reduces the amount of sugar released into the blood and slows down your digestion. It can also be used to lower the risk of heart attack and stroke in people with type 2 diabetes. Changes to diet and exercise are often combined with this medication. This medicine may be used for other purposes; ask your health care provider or pharmacist if you have questions. COMMON BRAND NAME(S): OZEMPIC  How should I use this medication? This medication is for injection under the skin of your upper leg (thigh), stomach area, or upper arm. It is given once every week (every 7 days). You will be taught how to prepare and give this medication. Use exactly as directed. Take your medication at regular intervals. Do not take it more often than directed. If you use this  medication with insulin, you should inject this medication and the insulin separately. Do not mix them together. Do not give the injections right next to each other. Change (rotate) injection sites with each injection. It is important that you put your used needles and syringes in a special sharps container. Do not put them in a trash can. If you do not have a sharps  container, call your pharmacist or care team to get one. A special MedGuide will be given to you by the pharmacist with each prescription and refill. Be sure to read this information carefully each time. This medication comes with INSTRUCTIONS FOR USE. Ask your pharmacist for directions on how to use this medication. Read the information carefully. Talk to your pharmacist or care team if you have questions. Talk to your care team about the use of this medication in children. Special care may be needed. Overdosage: If you think you have taken too much of this medicine contact a poison control center or emergency room at once. NOTE: This medicine is only for you. Do not share this medicine with others. What if I miss a dose? If you miss a dose, take it as soon as you can within 5 days after the missed dose. Then take your next dose at your regular weekly time. If it has been longer than 5 days after the missed dose, do not take the missed dose. Take the next dose at your regular time. Do not take double or extra doses. If you have questions about a missed dose, contact your care team for advice. What should I watch for while using this medication? Visit your care team for regular checks on your progress. Drink plenty of fluids while taking this medication. Check with your care team if you get an attack of severe diarrhea, nausea, and vomiting. The loss of too much body fluid can make it dangerous for you to take this medication. A test called the HbA1C (A1C) will be monitored. This is a simple blood test. It measures your blood sugar control over the last 2 to 3 months. You will receive this test every 3 to 6 months. Learn how to check your blood sugar. Learn the symptoms of low and high blood sugar and how to manage them. Always carry a quick-source of sugar with you in case you have symptoms of low blood sugar. Examples include hard sugar candy or glucose tablets. Make sure others know that you can  choke if you eat or drink when you develop serious symptoms of low blood sugar, such as seizures or unconsciousness. They must get medical help at once. Tell your care team if you have high blood sugar. You might need to change the dose of your medication. If you are sick or exercising more than usual, you might need to change the dose of your medication. Do not skip meals. Ask your care team if you should avoid alcohol. Many nonprescription cough and cold products contain sugar or alcohol. These can affect blood sugar. Pens should never be shared. Even if the needle is changed, sharing may result in passing of viruses like hepatitis or HIV. Wear a medical ID bracelet or chain, and carry a card that describes your disease and details of your medication and dosage times. Do not become pregnant while taking this medication. Women should inform their care team if they wish to become pregnant or think they might be pregnant. There is a potential for serious side effects to an unborn child. Talk  to your care team for more information. What side effects may I notice from receiving this medication? Side effects that you should report to your care team as soon as possible: Allergic reactions-skin rash, itching, hives, swelling of the face, lips, tongue, or throat Change in vision Dehydration-increased thirst, dry mouth, feeling faint or lightheaded, headache, dark yellow or brown urine Gallbladder problems-severe stomach pain, nausea, vomiting, fever Heart palpitations-rapid, pounding, or irregular heartbeat Kidney injury-decrease in the amount of urine, swelling of the ankles, hands, or feet Pancreatitis-severe stomach pain that spreads to your back or gets worse after eating or when touched, fever, nausea, vomiting Thyroid cancer-new mass or lump in the neck, pain or trouble swallowing, trouble breathing, hoarseness Side effects that usually do not require medical attention (report to your care team if  they continue or are bothersome): Diarrhea Loss of appetite Nausea Stomach pain Vomiting This list may not describe all possible side effects. Call your doctor for medical advice about side effects. You may report side effects to FDA at 1-800-FDA-1088. Where should I keep my medication? Keep out of the reach of children. Store unopened pens in a refrigerator between 2 and 8 degrees C (36 and 46 degrees F). Do not freeze. Protect from light and heat. After you first use the pen, it can be stored for 56 days at room temperature between 15 and 30 degrees C (59 and 86 degrees F) or in a refrigerator. Throw away your used pen after 56 days or after the expiration date, whichever comes first. Do not store your pen with the needle attached. If the needle is left on, medication may leak from the pen. NOTE: This sheet is a summary. It may not cover all possible information. If you have questions about this medicine, talk to your doctor, pharmacist, or health care provider.  2022 Elsevier/Gold Standard (2020-12-08 16:01:53)

## 2021-07-07 ENCOUNTER — Encounter (HOSPITAL_BASED_OUTPATIENT_CLINIC_OR_DEPARTMENT_OTHER): Payer: Self-pay

## 2021-07-07 ENCOUNTER — Other Ambulatory Visit (HOSPITAL_COMMUNITY): Payer: Self-pay

## 2021-07-07 ENCOUNTER — Other Ambulatory Visit: Payer: Self-pay | Admitting: *Deleted

## 2021-07-07 DIAGNOSIS — C50411 Malignant neoplasm of upper-outer quadrant of right female breast: Secondary | ICD-10-CM

## 2021-07-07 DIAGNOSIS — Z170 Estrogen receptor positive status [ER+]: Secondary | ICD-10-CM

## 2021-07-07 NOTE — Progress Notes (Signed)
[place]  [phone] [phone]     ID: Katelyn Shelton DOB: 05-18-1976  MR#: [id number]  POE#:[id number]  Patient Care Team: Marin Olp, MD as PCP - General (Family Medicine) Freada Bergeron, MD as PCP - Cardiology (Cardiology) Candida Vetter, Virgie Dad, MD as Consulting Physician (Oncology) Rolm Bookbinder, MD as Consulting Physician (General Surgery) Louretta Shorten, MD as Consulting Physician (Obstetrics and Gynecology) Juanita Craver Gerrie Nordmann, MD as Referring Physician (Hematology and Oncology) Kyung Rudd, MD as Consulting Physician (Radiation Oncology) Irene Limbo, MD as Consulting Physician (Plastic Surgery) Muss, Demaris Callander as Consulting Physician (Internal Medicine) Kathrynn Ducking, MD as Consulting Physician (Neurology) Dorothy Spark, MD (Inactive) as Consulting Physician (Cardiology) OTHER MD:  CHIEF COMPLAINT: Estrogen receptor positive breast cancer (s/p bilateral mastectomies)  CURRENT TREATMENT: Exemestane; ibandronate   INTERVAL HISTORY: Katelyn Shelton returns today for follow up of her estrogen receptor positive breast cancer.   She continues on exemestane.  She feels she is doing well with this at present.  Vaginal dryness continues to be an issue.  Hot flashes are only occasional.  Katelyn Shelton receives Prolia every 6 months with labs prior. She was scheduled for dose today but her insurance now says that they will only pay for zoledronate.  This is discussed further below.  Her most recent bone density screening on 05/28/2020 showed a T-score of -1.7, which is considered osteopenic.  This is improved  Since her last visit, she had her left chest implant removed 03/02/2021 due to infection.  Recall she previously had had her right implant removed.    More recently she presented with inner right breast pain. She underwent right breast ultrasound on 06/26/2021 showing no abnormalities.  She has been thoroughly evaluated at [facility] by Dr.  Abelino Derrick and is not felt to be a good candidate for a DIEP reconstruction  REVIEW OF SYSTEMS: Katelyn Shelton is very frustrated because of the ongoing surgical issues.  There does not seem to be a clear final answer.  She is busy at home with the children who are currently 5 45 and 61 years old.  She remains very active and exercises regularly.  A detailed review of systems was otherwise stable   COVID 19 VACCINATION STATUS: infection 11/02/2020; status post Moderna x2 with booster September 2021   BREAST CANCER HISTORY: From the original intake note:  "Katelyn Shelton" saw Dr. Corinna Capra for routine follow-up and was found to have a palpable mass in the upper outer quadrant of her right breast and was set up for bilateral diagnostic mammography with tomography and right breast ultrasonography at the [facility] 02/08/2017. The breast density was category C. In the upper outer quadrant of the right breast there was an irregular mass. There was also a 0.3 cm group of slightly heterogeneous calcifications in the retroareolar left breast. On exam there was indeed a firm palpable mass at the 10:00 position of the right breast 4 cm from the nipple. On ultrasound this measured 3.5 cm, it was irregular and hypoechoic. There was no abnormal right axillary adenopathy.  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 The area of left breast calcifications was biopsied 02/09/2017 and showed only fibrocystic changes. ([id number] [id number]).  Her subsequent history is as detailed below.   PAST MEDICAL HISTORY: Past Medical History:  Diagnosis Date  Allergy    Anxiety    venflafaxine XR 75 mg   Breast cancer ([place]) 01/2017   right; genetic testing negative in 2018 with [facility] panel   Heart murmur    as child- went away    History of MRSA infection    elbow   Hyperlipidemia    Infection of breast implant ([place]) 01/19/2021   Joint pain    due to medicine induced joint pain   Migraine    otc med prn   Miscarriage    x1   MRSA infection 01/28/2021   Nausea and vomiting 01/19/2021   Neuromuscular disorder (HCC)    neuropathy bilater feet - ? r/t chemo   Osteopenia    UTI (urinary tract infection)     PAST SURGICAL HISTORY: Past Surgical History:  Procedure Laterality Date   AREOLA/NIPPLE RECONSTRUCTION WITH GRAFT Right 12/01/2018   Procedure: RIGHT NIPPLE AREOLA COMPLEX RECONSTRUCTION WITH FULL THICKNESS SKIN GRAFT FROM RIGHT GROIN;  Surgeon: Irene Limbo, MD;  Location: [place];  Service: Plastics;  Laterality: Right;   BREAST IMPLANT REMOVAL Right 01/20/2021   Procedure: REMOVAL RIGHT BREAST IMPLANT;  Surgeon: Irene Limbo, MD;  Location: [place];  Service: Plastics;  Laterality: Right;   BREAST IMPLANT REMOVAL Left 03/02/2021   Procedure: REMOVAL LEFT CHEST IMPLANT AND CAPSULECTOMY;  Surgeon: Irene Limbo, MD;  Location: [place];  Service: Plastics;  Laterality: Left;   BREAST RECONSTRUCTION Left 12/01/2018   Procedure: LEFT BREAST REVISION RECONSTRUCTION WITH SKIN EXCISION;  Surgeon: Irene Limbo, MD;  Location: [place];  Service: Plastics;  Laterality: Left;   BREAST RECONSTRUCTION Left 12/01/2018   Procedure: COMPOSITE GRAFT FROM LEFT NIPPLE (NIPPLE SHARING);  Surgeon: Irene Limbo, MD;  Location: [place];  Service: Plastics;  Laterality: Left;   BREAST RECONSTRUCTION WITH PLACEMENT OF TISSUE EXPANDER AND FLEX HD (ACELLULAR HYDRATED DERMIS) Bilateral 02/28/2017   Procedure: BILATERAL BREAST RECONSTRUCTION WITH PLACEMENT OF TISSUE EXPANDER AND ALLODERM;  Surgeon: Irene Limbo, MD;  Location: [place];  Service: Plastics;  Laterality: Bilateral;   CESAREAN SECTION  2008,2010,2013   x 3   CESAREAN  SECTION  02/04/2012   Procedure: CESAREAN SECTION;  Surgeon: Cyril Mourning, MD;  Location: [facility];  Service: Gynecology;  Laterality: N/A;  Repeat Cesarean Section Delivery  Boy  @  [phone], Apgars 9/10   COLONOSCOPY     polyp   DILATION AND CURETTAGE OF UTERUS     EXCISION OF BREAST LESION Right 03/15/2017   Procedure: RIGHT NIPPLE AND AREOLA EXCISION;  Surgeon: Rolm Bookbinder, MD;  Location: [place];  Service: General;  Laterality: Right;   LAPAROSCOPIC VAGINAL HYSTERECTOMY WITH SALPINGO OOPHORECTOMY Bilateral 06/29/2018   Procedure: LAPAROSCOPIC ASSISTED VAGINAL HYSTERECTOMY WITH SALPINGO OOPHORECTOMY;  Surgeon: Louretta Shorten, MD;  Location: [facility];  Service: Gynecology;  Laterality: Bilateral;  Request case for [facility] CRNA   LIPOSUCTION WITH LIPOFILLING N/A 07/25/2018   Procedure: LIPOSUCTION WITH LIPOFILLING FROM ABDOMEN TO CHEST;  Surgeon: Irene Limbo, MD;  Location: [place];  Service: Plastics;  Laterality: N/A;   LIPOSUCTION WITH LIPOFILLING Bilateral 12/01/2018   Procedure: LIPOFILLING TO BILATERAL CHEST REVISION;  Surgeon: Irene Limbo, MD;  Location: [place];  Service: Plastics;  Laterality: Bilateral;   NIPPLE SPARING MASTECTOMY Bilateral 02/28/2017   Procedure: RIGHT NIPPLE SPARING MASTECTOMY; LEFT PROPHYLACTIC NIPPLE SPARING MASTECTOMY;  Surgeon: Rolm Bookbinder, MD;  Location: [place];  Service: General;  Laterality: Bilateral;   PECTUS EXCAVATUM REPAIR  6622   45 years old- very large implant   REMOVAL OF TISSUE EXPANDER AND PLACEMENT OF IMPLANT Bilateral 07/25/2018   Procedure: REMOVAL OF TISSUE EXPANDER AND PLACEMENT OF SILICONE IMPLANT;  Surgeon: Irene Limbo, MD;  Location: [place];  Service: Plastics;  Laterality: Bilateral;   SENTINEL NODE BIOPSY Right 02/28/2017   Procedure: RIGHT AXILLARY SENTINEL LYMPH  NODE BIOPSY;  Surgeon: Rolm Bookbinder, MD;  Location: [place];  Service: General;  Laterality: Right;   VULVAR LESION REMOVAL     WISDOM TOOTH EXTRACTION      FAMILY HISTORY Family History  Problem Relation Age of Onset   Skin cancer Mother    Hyperlipidemia Mother    Hypertension Mother    Colon polyps Mother    Hyperlipidemia Father    Parkinson's disease Sister 34       early on set   84 / Stillbirths Sister    Diabetes Maternal Aunt    Colon polyps Maternal Uncle    Heart failure Maternal Grandmother    Stroke Maternal Grandmother    Heart attack Maternal Grandfather    Depression Maternal Grandfather    Early death Maternal Grandfather        47 MI   Stomach cancer Paternal Grandmother    Prostate cancer Paternal Grandfather    Alzheimer's disease Paternal Grandfather    Breast cancer Cousin        mother's paternal first FEMALE cousin   Colon polyps Sister    Colon cancer Neg Hx    Rectal cancer Neg Hx   The patient's father, Carolyne Fiscal, is an Administrator, Civil Service, currently 44 years old. The patient's mother is 58 years old as of April 2018. Patient has no brothers, 2 sisters. There is a history breast cancer in the family in a great aunt on the patient's father's side. On the maternal side there is a female first cousin of the patient's mother with breast cancer. There is no history of ovarian cancer in the family   GYNECOLOGIC HISTORY:  Patient's last menstrual period was 09/01/2017. Katelyn Shelton had her first menstrual period age 23, her first live birth age 84. She has had 3 C-sections. Her periods are approximately every 21 days, with one heavy day. Her husband is status post vasectomy.   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 Her husband [name] is one of our cardiologists. The patient attends [employer] DIRECTIVES: In the absence of any documentation to the contrary, the patient's spouse is their HCPOA.    HEALTH  MAINTENANCE: Social History   Tobacco Use   Smoking status: Never   Smokeless tobacco: Never  Vaping Use   Vaping Use: Never used  Substance Use Topics   Alcohol use: Yes    Alcohol/week: 1.0 - 2.0 standard drink    Types: 1 - 2 Standard drinks or equivalent per week    Comment: occasional wine - twice monthly   Drug use: No     Colonoscopy:  PAP:  Bone density: 11/25/2017 showed a T score of -2.0 osteopenic   Allergies  Allergen Reactions   Fentanyl Itching    Severe itching - Patient refuses this medication   Peach Flavor Anaphylaxis   Penicillins Hives    AS A CHILD   Sulfa Antibiotics Nausea And Vomiting   Sulfasalazine Nausea And Vomiting  and Other (See Comments)   Latex Itching    Current Outpatient Medications  Medication Sig Dispense Refill   ibandronate (BONIVA) 150 MG tablet Take in the morning every 28 days; take with a full glass of water, on an empty stomach, and do not take anything else by mouth or lie down for the next 60 min. 4 tablet 4   buPROPion (WELLBUTRIN XL) 300 MG 24 hr tablet Take 1 tablet (300 mg total) by mouth daily. 30 tablet 6   Calcium Carbonate (CALCIUM 600 PO) Take 1 capsule by mouth daily.     Evolocumab (REPATHA SURECLICK) 671 MG/ML SOAJ Inject 140 mg (1 pen) into the skin every 14 (fourteen) days. 2 mL 11   exemestane (AROMASIN) 25 MG tablet Take 1 tablet (25 mg total) by mouth daily after breakfast. 90 tablet 3   Multiple Vitamins-Minerals (MULTIVITAMIN WOMEN 50+) TABS Take 1 tablet by mouth daily.     mupirocin cream (BACTROBAN) 2 % Apply 1 application topically 2 (two) times daily. (Patient not taking: Reported on 07/06/2021) 30 g 1   ondansetron (ZOFRAN-ODT) 8 MG disintegrating tablet Take 1 tablet (8 mg total) by mouth every 8 (eight) hours as needed for nausea (Patient not taking: Reported on 07/06/2021) 10 tablet 0   Semaglutide,0.25 or 0.5MG/DOS, (OZEMPIC, 0.25 OR 0.5 MG/DOSE,) 2 MG/1.5ML SOPN Take 0.79m once per week for four  weeks. Then take 0.577monce per week for 4 weeks. 1.5 mL 2   venlafaxine (EFFEXOR) 37.5 MG tablet Take 1 tablet (37.5 mg total) by mouth 2 (two) times daily. (Patient not taking: Reported on 07/06/2021) 30 tablet 0   No current facility-administered medications for this visit.    OBJECTIVE: white woman who appears younger than stated age Vi45  07/08/21 1106  BP: (!) 102/52  Pulse: 77  Resp: 18  Temp: 98 F (36.7 C)  SpO2: 100%   Body mass index is 26.3 kg/m.  ECOG FS:1 - Symptomatic but completely ambulatory   Sclerae unicteric, EOMs intact Wearing a mask No cervical or supraclavicular adenopathy Lungs no rales or rhonchi Heart regular rate and rhythm Abd soft, nontender, positive bowel sounds MSK no focal spinal tenderness, no upper extremity lymphedema Neuro: nonfocal, well oriented, appropriate affect Breasts: Status post bilateral mastectomies status post implant removal.  There is overall symmetry.  There is no evidence of infection at present.  Both axillae are benign.   LAB RESULTS:  CMP     Component Value Date/Time   NA 142 07/08/2021 1057   NA 140 08/19/2017 1337   K 4.0 07/08/2021 1057   K 3.6 08/19/2017 1337   CL 102 07/08/2021 1057   CO2 27 07/08/2021 1057   CO2 24 08/19/2017 1337   GLUCOSE 94 07/08/2021 1057   GLUCOSE 88 08/19/2017 1337   BUN 14 07/08/2021 1057   BUN 10.9 08/19/2017 1337   CREATININE 0.81 07/08/2021 1057   CREATININE 0.7 08/19/2017 1337   CALCIUM 11.0 (H) 07/08/2021 1057   CALCIUM 9.0 08/19/2017 1337   PROT 8.2 (H) 07/08/2021 1057   PROT 7.0 06/22/2018 0817   PROT 6.6 08/19/2017 1337   ALBUMIN 4.6 07/08/2021 1057   ALBUMIN 4.6 06/22/2018 0817   ALBUMIN 3.9 08/19/2017 1337   AST 23 07/08/2021 1057   AST 35 (H) 08/19/2017 1337   ALT 30 07/08/2021 1057   ALT 55 08/19/2017 1337   ALKPHOS 55 07/08/2021 1057   ALKPHOS 61 08/19/2017 1337   BILITOT 0.6 07/08/2021 1057   BILITOT  0.41 08/19/2017 1337   GFRNONAA >60 07/08/2021  1057   GFRAA >60 07/10/2020 0949    No results found for: Ronnald Ramp, A1GS, A2GS, BETS, BETA2SER, GAMS, MSPIKE, SPEI  No results found for: Nils Pyle, [facility]  Lab Results  Component Value Date   WBC 5.9 07/08/2021   NEUTROABS 3.9 07/08/2021   HGB 14.8 07/08/2021   HCT 42.5 07/08/2021   MCV 93.0 07/08/2021   PLT 233 07/08/2021      Chemistry      Component Value Date/Time   NA 142 07/08/2021 1057   NA 140 08/19/2017 1337   K 4.0 07/08/2021 1057   K 3.6 08/19/2017 1337   CL 102 07/08/2021 1057   CO2 27 07/08/2021 1057   CO2 24 08/19/2017 1337   BUN 14 07/08/2021 1057   BUN 10.9 08/19/2017 1337   CREATININE 0.81 07/08/2021 1057   CREATININE 0.7 08/19/2017 1337      Component Value Date/Time   CALCIUM 11.0 (H) 07/08/2021 1057   CALCIUM 9.0 08/19/2017 1337   ALKPHOS 55 07/08/2021 1057   ALKPHOS 61 08/19/2017 1337   AST 23 07/08/2021 1057   AST 35 (H) 08/19/2017 1337   ALT 30 07/08/2021 1057   ALT 55 08/19/2017 1337   BILITOT 0.6 07/08/2021 1057   BILITOT 0.41 08/19/2017 1337       No results found for: LABCA2  No components found for: [id number]  No results for input(s): INR in the last 168 hours.  Urinalysis    Component Value Date/Time   COLORURINE YELLOW 08/26/2011 1652   APPEARANCEUR CLEAR 08/26/2011 1652   LABSPEC 1.025 12/21/2013 1226   PHURINE 6.0 12/21/2013 1226   GLUCOSEU NEGATIVE 12/21/2013 1226   HGBUR TRACE (A) 12/21/2013 1226   BILIRUBINUR Negative 05/16/2018 1129   KETONESUR NEGATIVE 12/21/2013 1226   PROTEINUR Negative 05/16/2018 1129   PROTEINUR NEGATIVE 12/21/2013 1226   UROBILINOGEN 0.2 05/16/2018 1129   UROBILINOGEN 0.2 12/21/2013 1226   NITRITE Negative 05/16/2018 1129   NITRITE NEGATIVE 12/21/2013 1226   LEUKOCYTESUR Moderate (2+) (A) 05/16/2018 1129    STUDIES: US BREAST LTD UNI RIGHT INC AXILLA  Result Date: 06/26/2021 CLINICAL DATA:  45 year old female with INNER RIGHT breast pain. History  of RIGHT breast cancer in 2018 with bilateral mastectomies and implant reconstruction. History of chest MRSA infection with bilateral explantation in 2022. EXAM: ULTRASOUND OF THE RIGHT BREAST COMPARISON:  Previous exam(s). FINDINGS: On physical exam, no RIGHT breast erythema is noted. Targeted ultrasound is performed, showing no sonographic abnormalities identified within the [facility] RIGHT breast, in the area of patient's pain. IMPRESSION: No sonographic abnormalities within the [facility] RIGHT breast, in the area of patient's pain. RECOMMENDATION: Consider clinical follow-up as indicated. Any further workup should be based on clinical grounds. I have discussed the findings and recommendations with the patient. If applicable, a reminder letter will be sent to the patient regarding the next appointment. BI-RADS CATEGORY  1: Negative. Electronically Signed   By: Margarette Canada M.D.   On: 06/26/2021 14:58    ELIGIBLE FOR AVAILABLE RESEARCH PROTOCOL: no  ASSESSMENT: 45 y.o. [place] woman status post right breast upper outer quadrant biopsy 02/08/2017 for a clinical T2 N0, stage IB invasive ductal carcinoma, grade 2, estrogen and progesterone receptor positive, HER-2 nonamplified, with an MIB-1 of 5%.  (a) biopsy of upper outer quadrant calcifications in the left breast 02/09/2017 were benign  (1) Oncotype DX obtained from the biopsy sample showed a recurrence score of 11 predicting a 10  year risk of recurrence outside the breast of 9% if the patient's only systemic therapy is tamoxifen for 5 years (node positive report)  (2) bilateral mastectomies with right sentinel lymph node sampling 02/28/2017 showed  (a) on the left, intraductal papilloma, with no evidence of malignancy.  (b) on the right, a pT2 pN1 stage IIA invasive ductal carcinoma, grade 1, with close margins  (c) additional excision of the right nipple areolar area 03/15/2017 found no residual carcinoma  (d) status post bilateral definitive silicone  implant placement 07/25/2018  (e) status post bilateral implant removal and 2022 secondary to infection  (F) not a candidate for DIEP reconstruction  (3) Mammaprint study on the final surgical sample came back low risk, predicting a five-year distant disease-free survival of 97.8% with hormone therapy alone   (3) tamoxifen started neoadjuvantly 02/11/2017, Stopped 04/11/2017 in preparation for chemotherapy  (4) genetics counseling 02/19/2017 showed no deleterious mutations in the STAT gene panel offered by [facility] including ATM, BRCA1, BRCA2, CDH1, CHEK2, PALB2, PTEN, STK11, and TP53.  (a) multi gene panel February 21, 2017 through the  Multi-Gene Panel offered by [facility] found no deleterious mutations in ALK, APC, ATM, AXIN2,BAP1,  BARD1, BLM, BMPR1A, BRCA1, BRCA2, BRIP1, CASR, CDC73, CDH1, CDK4, CDKN1B, CDKN1C, CDKN2A (p14ARF), CDKN2A (p16INK4a), CEBPA, CHEK2, CTNNA1, DICER1, DIS3L2, EGFR (c.2369C>T, p.Thr790Met variant only), EPCAM (Deletion/duplication testing only), FH, FLCN, GATA2, GPC3, GREM1 (Promoter region deletion/duplication testing only), HOXB13 (c.251G>A, p.Gly84Glu), HRAS, KIT, MAX, MEN1, MET, MITF (c.952G>A, p.Glu318Lys variant only), MLH1, MSH2, MSH3, MSH6, MUTYH, NBN, NF1, NF2, NTHL1, PALB2, PDGFRA, PHOX2B, PMS2, POLD1, POLE, POT1, PRKAR1A, PTCH1, PTEN, RAD50, RAD51C, RAD51D, RB1, RECQL4, RET, RUNX1, SDHAF2, SDHA (sequence changes only), SDHB, SDHC, SDHD, SMAD4, SMARCA4, SMARCB1, SMARCE1, STK11, SUFU, TERT, TERT, TMEM127, TP53, TSC1, TSC2, VHL, WRN and WT1.   (5) adjuvant chemotherapy consisting of cyclophosphamide and docetaxel given every 21 days 4 started 04/15/2017, completed August 03, 2017 [facility]  (6) adjuvant radiation completed 09/30/2017  (7) anastrozole started 10/25/2017  (a) ovarian suppression with monthly goserelin October 2018 through August 2019  (b) bone density on 11/25/2017 showed a T score of -2.0 osteopenic  (c) status post hysterectomy and  bilateral salpingo-oophorectomy 06/29/2018, with benign pathology  (d) denosumab/Prolia started 12/30/2017, last dose 01/15/2021 as insurance withdrew approval  (e) repeat bone density 05/28/2020 shows a T score of -1.7 (improved)  (8) history of adrenal hyperplasia, 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  (9) significant hypercholesterolemia  (10) CT scan for cardiac scoring 03/30/2018 showed clustered nodules in the inferior right upper lobe.  This was felt to be most likely inflammatory.    (a) repeat CT of the chest on 10/10/2018 shows subpleural nodular scarring in the anterolateral right upper and middle lobes, unchanged from prior, compatible with radiation changes.  No findings suspicious for recurrent or metastatic disease.     PLAN: Katelyn Shelton is now 4-1/2 years out from definitive surgery for her breast cancer with no evidence of disease recurrence.  This is very favorable.  She is tolerating anastrozole moderately well and the plan is to continue that a total of 5 years.  The surgical situation is frustrating.  On the plus side her chest is now very symmetrical.  It appears the only option for reconstruction would be a TRAM flap at this point and she really does not want to undergo that.  It is also frustrating that her insurance withdrew approval of denosumab/Prolia.  We discussed appealing that decision, switching to zoledronate, or considering oral bisphosphonates.  After much  discussion we are going to give ibandronate/Boniva a try.  We discussed how to take this medication safely and she will let me know if there is a problem with it.  Otherwise she will return to see [place] in 6 months.  Total encounter time 30 minutes.Sarajane Jews C. , MD  07/09/21 9:00 PM Medical Oncology and Hematology [facility] [place], [place] [address] Tel. [phone]    Fax. [phone]   I, Wilburn Mylar, am acting as scribe for Dr. Virgie Dad. .  I, Lurline Del MD,  have reviewed the above documentation for accuracy and completeness, and I agree with the above.   *Total Encounter Time as defined by the Centers for Medicare and Medicaid Services includes, in addition to the face-to-face time of a patient visit (documented in the note above) non-face-to-face time: obtaining and reviewing outside history, ordering and reviewing medications, tests or procedures, care coordination (communications with other health care professionals or caregivers) and documentation in the medical record.

## 2021-07-08 ENCOUNTER — Other Ambulatory Visit (HOSPITAL_COMMUNITY): Payer: Self-pay

## 2021-07-08 ENCOUNTER — Inpatient Hospital Stay: Payer: 59

## 2021-07-08 ENCOUNTER — Ambulatory Visit: Payer: 59

## 2021-07-08 ENCOUNTER — Other Ambulatory Visit: Payer: Self-pay

## 2021-07-08 ENCOUNTER — Inpatient Hospital Stay: Payer: 59 | Admitting: Oncology

## 2021-07-08 ENCOUNTER — Inpatient Hospital Stay: Payer: 59 | Attending: Oncology

## 2021-07-08 VITALS — BP 102/52 | HR 77 | Temp 98.0°F | Resp 18 | Ht 61.0 in | Wt 139.2 lb

## 2021-07-08 DIAGNOSIS — Z9013 Acquired absence of bilateral breasts and nipples: Secondary | ICD-10-CM | POA: Insufficient documentation

## 2021-07-08 DIAGNOSIS — Z170 Estrogen receptor positive status [ER+]: Secondary | ICD-10-CM | POA: Diagnosis not present

## 2021-07-08 DIAGNOSIS — C50411 Malignant neoplasm of upper-outer quadrant of right female breast: Secondary | ICD-10-CM

## 2021-07-08 DIAGNOSIS — Z90710 Acquired absence of both cervix and uterus: Secondary | ICD-10-CM | POA: Insufficient documentation

## 2021-07-08 DIAGNOSIS — Z79811 Long term (current) use of aromatase inhibitors: Secondary | ICD-10-CM | POA: Insufficient documentation

## 2021-07-08 DIAGNOSIS — M8580 Other specified disorders of bone density and structure, unspecified site: Secondary | ICD-10-CM | POA: Insufficient documentation

## 2021-07-08 LAB — CMP (CANCER CENTER ONLY)
ALT: 30 U/L (ref 0–44)
AST: 23 U/L (ref 15–41)
Albumin: 4.6 g/dL (ref 3.5–5.0)
Alkaline Phosphatase: 55 U/L (ref 38–126)
Anion gap: 13 (ref 5–15)
BUN: 14 mg/dL (ref 6–20)
CO2: 27 mmol/L (ref 22–32)
Calcium: 11 mg/dL — ABNORMAL HIGH (ref 8.9–10.3)
Chloride: 102 mmol/L (ref 98–111)
Creatinine: 0.81 mg/dL (ref 0.44–1.00)
GFR, Estimated: >60 mL/min (ref >60)
Glucose, Bld: 94 mg/dL (ref 70–99)
Potassium: 4 mmol/L (ref 3.5–5.1)
Sodium: 142 mmol/L (ref 135–145)
Total Bilirubin: 0.6 mg/dL (ref 0.3–1.2)
Total Protein: 8.2 g/dL — ABNORMAL HIGH (ref 6.5–8.1)

## 2021-07-08 LAB — CBC WITH DIFFERENTIAL (CANCER CENTER ONLY)
Abs Immature Granulocytes: 0.02 10*3/uL (ref 0.00–0.07)
Basophils Absolute: 0.1 10*3/uL (ref 0.0–0.1)
Basophils Relative: 1 %
Eosinophils Absolute: 0.1 10*3/uL (ref 0.0–0.5)
Eosinophils Relative: 2 %
HCT: 42.5 % (ref 36.0–46.0)
Hemoglobin: 14.8 g/dL (ref 12.0–15.0)
Immature Granulocytes: 0 %
Lymphocytes Relative: 21 %
Lymphs Abs: 1.2 10*3/uL (ref 0.7–4.0)
MCH: 32.4 pg (ref 26.0–34.0)
MCHC: 34.8 g/dL (ref 30.0–36.0)
MCV: 93 fL (ref 80.0–100.0)
Monocytes Absolute: 0.6 10*3/uL (ref 0.1–1.0)
Monocytes Relative: 10 %
Neutro Abs: 3.9 10*3/uL (ref 1.7–7.7)
Neutrophils Relative %: 66 %
Platelet Count: 233 10*3/uL (ref 150–400)
RBC: 4.57 MIL/uL (ref 3.87–5.11)
RDW: 12.6 % (ref 11.5–15.5)
WBC Count: 5.9 10*3/uL (ref 4.0–10.5)
nRBC: 0 % (ref 0.0–0.2)

## 2021-07-08 MED ORDER — EXEMESTANE 25 MG PO TABS
25.0000 mg | ORAL_TABLET | Freq: Every day | ORAL | 3 refills | Status: DC
  Filled 2021-07-08 – 2021-08-10 (×3): qty 90, 90d supply, fill #0
  Filled 2021-11-02: qty 90, 90d supply, fill #1
  Filled 2022-01-07: qty 90, 90d supply, fill #2
  Filled 2022-02-09: qty 90, 90d supply, fill #3
  Filled 2022-02-18: qty 30, 30d supply, fill #3
  Filled 2022-05-28: qty 90, 90d supply, fill #3

## 2021-07-08 MED ORDER — IBANDRONATE SODIUM 150 MG PO TABS
ORAL_TABLET | ORAL | 4 refills | Status: DC
  Filled 2021-07-08: qty 3, 84d supply, fill #0
  Filled 2021-09-23: qty 3, 84d supply, fill #1
  Filled 2021-11-09: qty 3, 84d supply, fill #2
  Filled 2022-01-06: qty 3, 84d supply, fill #3
  Filled 2022-03-09 – 2022-04-14 (×2): qty 3, 84d supply, fill #4
  Filled 2022-07-07: qty 3, 84d supply, fill #5

## 2021-07-09 ENCOUNTER — Encounter: Payer: Self-pay | Admitting: Oncology

## 2021-07-09 ENCOUNTER — Other Ambulatory Visit: Payer: Self-pay | Admitting: Oncology

## 2021-07-14 ENCOUNTER — Other Ambulatory Visit: Payer: 59

## 2021-07-15 ENCOUNTER — Telehealth: Payer: Self-pay

## 2021-07-15 NOTE — Telephone Encounter (Signed)
Levada Dy from [employer] called with  a few questions about the orders for Prolia injections. She can be reached at [phone] and her ext is [phone]. Please Advise.

## 2021-07-23 ENCOUNTER — Other Ambulatory Visit (HOSPITAL_COMMUNITY): Payer: Self-pay

## 2021-07-31 NOTE — Telephone Encounter (Signed)
Spoke with [employer] again today and they only received one side of the form that I faxed so I faxed both sides again today

## 2021-08-10 ENCOUNTER — Other Ambulatory Visit (HOSPITAL_COMMUNITY): Payer: Self-pay

## 2021-08-11 ENCOUNTER — Other Ambulatory Visit (HOSPITAL_COMMUNITY): Payer: Self-pay

## 2021-08-11 ENCOUNTER — Other Ambulatory Visit (HOSPITAL_BASED_OUTPATIENT_CLINIC_OR_DEPARTMENT_OTHER): Payer: Self-pay | Admitting: Family

## 2021-08-11 DIAGNOSIS — Z833 Family history of diabetes mellitus: Secondary | ICD-10-CM

## 2021-08-11 DIAGNOSIS — I739 Peripheral vascular disease, unspecified: Secondary | ICD-10-CM

## 2021-08-11 DIAGNOSIS — E663 Overweight: Secondary | ICD-10-CM

## 2021-08-12 ENCOUNTER — Other Ambulatory Visit (HOSPITAL_COMMUNITY): Payer: Self-pay

## 2021-08-12 ENCOUNTER — Telehealth: Payer: Self-pay | Admitting: Family

## 2021-08-12 DIAGNOSIS — Z6827 Body mass index (BMI) 27.0-27.9, adult: Secondary | ICD-10-CM

## 2021-08-12 DIAGNOSIS — E663 Overweight: Secondary | ICD-10-CM

## 2021-08-12 MED ORDER — SEMAGLUTIDE (1 MG/DOSE) 4 MG/3ML ~~LOC~~ SOPN
1.0000 mg | PEN_INJECTOR | SUBCUTANEOUS | 0 refills | Status: DC
  Filled 2021-08-12 – 2021-08-31 (×2): qty 3, 28d supply, fill #0

## 2021-08-12 NOTE — Telephone Encounter (Signed)
Contacted patient. Reports tolerating Ozempic well and losing weight. Only mild constipation last week which she attributes to being out of town and out of routine. Discussed plan to gradually increase to 2.4mg  dose for optimal weight loss benefit.   Ozempic was started 07/06/21. Dosing as follows. If she has trouble with any dose increases she will contact us  Week 1-4 (9/12-10/9) Take 0.25mg  once weekly Week 5-8 (10/10-11/6) Take 0.5mg  once weekly Week 9-12 (11/7-12/4) 1mg  once weekly Week 13-16 (12/5-1/1) 1.7mg  once weekly Week 16 and thereafter (1/2) 2.4mg  once weekly  Contacted pharmacy. Patient was under impression she was already to be on the 1mg  dose. Will ask nursing team to contact her to make her aware of dosing schedule.  She has refills available to take 0.5mg  once weekly until 11/6.   Will send Rx to start 11/7 for 1 mg dosing.  Loel Dubonnet, NP

## 2021-08-12 NOTE — Telephone Encounter (Signed)
Would you mind helping with this refill please? I believe she should be on 0.5 dose as this should be week 6. Just want to make sure that is correct.

## 2021-08-12 NOTE — Telephone Encounter (Signed)
*  STAT* If patient is at the pharmacy, call can be transferred to refill team.   1. Which medications need to be refilled? (please list name of each medication and dose if known)  Semaglutide,0.25 or 0.5MG /DOS, (OZEMPIC, 0.25 OR 0.5 MG/DOSE,) 2 MG/1.5ML SOPN  2. Which pharmacy/location (including street and city if local pharmacy) is medication to be sent to? [facility]   3. Do they need a 30 day or 90 day supply?    Please contact patient to clarify new dosage prior to refilling.

## 2021-08-13 NOTE — Telephone Encounter (Signed)
LMTCB

## 2021-08-13 NOTE — Telephone Encounter (Signed)
Pt returned call. Informed her that her Ozempic 0.5 mg and 1 mg is at the pharmacy. Informed her of Caitlin's recommendations. Pt verbalized thanks and understanding.

## 2021-08-14 ENCOUNTER — Other Ambulatory Visit (HOSPITAL_COMMUNITY): Payer: Self-pay

## 2021-08-26 ENCOUNTER — Other Ambulatory Visit (HOSPITAL_COMMUNITY): Payer: Self-pay

## 2021-08-31 ENCOUNTER — Other Ambulatory Visit (HOSPITAL_COMMUNITY): Payer: Self-pay

## 2021-09-01 ENCOUNTER — Other Ambulatory Visit (HOSPITAL_COMMUNITY): Payer: Self-pay

## 2021-09-08 ENCOUNTER — Telehealth (INDEPENDENT_AMBULATORY_CARE_PROVIDER_SITE_OTHER): Payer: 59 | Admitting: Family Medicine

## 2021-09-08 ENCOUNTER — Encounter: Payer: Self-pay | Admitting: Family Medicine

## 2021-09-08 VITALS — Ht 61.0 in | Wt 124.0 lb

## 2021-09-08 DIAGNOSIS — M545 Low back pain, unspecified: Secondary | ICD-10-CM

## 2021-09-08 NOTE — Progress Notes (Signed)
Phone [phone] Virtual visit via Video note   Subjective:  Chief complaint: Chief Complaint  Patient presents with   Medication Reaction    Pt c/o ozempic reaction and since being on it she has noticed lower back pain near her kidneys and she states it feels likes a mild menstrual cramp that happens every time she takes it along with constipation she has been taking fiber pills and colace PRN along with dry mouth.    This visit type was conducted due to national recommendations for restrictions regarding the COVID-19 Pandemic (e.g. social distancing).  This format is felt to be most appropriate for this patient at this time balancing risks to patient and risks to population by having him in for in person visit.  No physical exam was performed (except for noted visual exam or audio findings with Telehealth visits).    Our team/I connected with [name] at  4:20 PM EST by a video enabled telemedicine application (doxy.me or caregility through epic) and verified that I am speaking with the correct person using two identifiers.  Location patient: [place] Location provider: [facility], office Persons participating in the virtual visit:  patient  Our team/I discussed the limitations of evaluation and management by telemedicine and the availability of in person appointments. In light of current covid-19 pandemic, patient also understands that we are trying to protect them by minimizing in office contact if at all possible.  The patient expressed consent for telemedicine visit and agreed to proceed. Patient understands insurance will be billed.   Past Medical History-  Patient Active Problem List   Diagnosis Date Noted   Malignant neoplasm of upper-outer quadrant of right breast in female, estrogen receptor positive ([place]) 02/11/2017    Priority: High   Hyperlipidemia, familial, high LDL 01/16/2010    Priority: High   Osteopenia of lumbar spine 05/28/2020    Priority: Medium     Aromatase inhibitor-associated arthralgia 02/24/2018    Priority: Medium    Enchondroma of bone 02/24/2018    Priority: Medium    Anxiety     Priority: Medium    Family history of colonic polyps 02/24/2018    Priority: Low   Genetic testing 02/21/2017    Priority: Low   Family history of stomach cancer     Priority: Low   Therapeutic drug monitoring 02/03/2021   MRSA infection 01/28/2021   Nausea and vomiting 01/19/2021   Infection of breast implant ([place]) 01/19/2021   Cellulitis of breast 12/06/2019   History of total hysterectomy 06/29/2018   Lesion of lung 04/05/2018   Abnormal CT scan of lung 04/05/2018   History of cancer 04/05/2018    Medications- reviewed and updated Current Outpatient Medications  Medication Sig Dispense Refill   buPROPion (WELLBUTRIN XL) 300 MG 24 hr tablet Take 1 tablet (300 mg total) by mouth daily. 30 tablet 6   Calcium Carbonate (CALCIUM 600 PO) Take 1 capsule by mouth daily.     Evolocumab (REPATHA SURECLICK) 259 MG/ML SOAJ Inject 140 mg (1 pen) into the skin every 14 (fourteen) days. 2 mL 11   exemestane (AROMASIN) 25 MG tablet Take 1 tablet (25 mg total) by mouth daily after breakfast. 90 tablet 3   ibandronate (BONIVA) 150 MG tablet Take in the morning every 28 days; take with a full glass of water, on an empty stomach, and do not take anything else by mouth or lie down for the next 60 min. 4 tablet 4   Multiple Vitamins-Minerals (MULTIVITAMIN  WOMEN 50+) TABS Take 1 tablet by mouth daily.     mupirocin cream (BACTROBAN) 2 % Apply 1 application topically 2 (two) times daily. 30 g 1   ondansetron (ZOFRAN-ODT) 8 MG disintegrating tablet Take 1 tablet (8 mg total) by mouth every 8 (eight) hours as needed for nausea 10 tablet 0   Semaglutide,0.25 or 0.5MG /DOS, (OZEMPIC, 0.25 OR 0.5 MG/DOSE,) 2 MG/1.5ML SOPN Take 0.25mg  once per week for four weeks. Then take 0.5mg  once per week for 4 weeks. 1.5 mL 2   venlafaxine (EFFEXOR) 37.5 MG tablet Take 1 tablet  (37.5 mg total) by mouth 2 (two) times daily. 30 tablet 0   No current facility-administered medications for this visit.     Objective:  Ht 5\' 1"  (1.549 m)   Wt 124 lb (56.2 kg)   LMP 09/01/2017   BMI 23.43 kg/m  self reported vitals Gen: NAD, resting comfortably Lungs: nonlabored, normal respiratory rate  Skin: appears dry, no obvious rash     Assessment and Plan   #Low back pain-patient concern for Ozempic reaction S:Patient reports of having an allergic reaction to the Ozempic medication (started through PA with cardiology due to weight gain- had gotten up to 152- she has gotten back down to 125 or so on medication- before her cancer she was at 117) that she started In September (had already lost some weight in august prior to starting by cutting carbs). She has noted some lower back pain near her kidneys (notes pain by that night and into next day then goes away) - feels like having a mild menstrual cramp. Also notes some constipation and dry mouth. Has been taking fiber pills and colace.   Team deleted 1 mg dose but that is what she is currently on A/P: 45 year old female on Ozempic through cardiology to help with weight loss in context of prior breast cancer and to reduce recurrence risk as well as to possibly reduce cardiovascular risk per cardiology-but patient is having side effects particularly low back pain on the evenings of Ozempic doses-and into the next day-she wants to make sure there is no significant underlying etiology to this-she would like to come by next Tuesday for blood work and urine testing-CBC, CMP, UA-I think that is very reasonable.  Discussed possibly stopping medication but she would like to continue for now as she  feels it has been very helpful for weight loss.  She would like to try to get down to 117 and honestly she would like to remain on medication for maintenance-I advised to come off of medication once she reaches her goals-honestly I think she could  stop medication even now with BMI near 23 as long as maintained weight loss-she prefers to continue current medication for now    Recommended follow up:  Future Appointments  Date Time Provider [place]  12/08/2021  1:20 PM Marin Olp, MD [facility]  03/10/2022  9:00 AM [facility] None  03/10/2022  9:30 AM Nicholas Lose, MD [facility] None    Lab/Order associations:   ICD-10-CM   1. Left-sided low back pain without sciatica, unspecified chronicity  M54.50 POCT Urinalysis Dipstick (Automated)    Comprehensive metabolic panel    CBC with Differential/Platelet      Time Spent: 24 minutes of total time (4:44 PM- 5:08 PM) was spent on the date of the encounter performing the following actions: chart review prior to seeing the patient, obtaining history, performing a medically necessary exam, counseling on the treatment  plan, placing orders, and documenting in our EHR.   Return precautions advised.  Garret Reddish, MD

## 2021-09-16 ENCOUNTER — Encounter: Payer: Self-pay | Admitting: Family Medicine

## 2021-09-16 ENCOUNTER — Other Ambulatory Visit (INDEPENDENT_AMBULATORY_CARE_PROVIDER_SITE_OTHER): Payer: 59

## 2021-09-16 ENCOUNTER — Other Ambulatory Visit: Payer: Self-pay

## 2021-09-16 ENCOUNTER — Other Ambulatory Visit: Payer: Self-pay | Admitting: Oncology

## 2021-09-16 DIAGNOSIS — M545 Low back pain, unspecified: Secondary | ICD-10-CM

## 2021-09-16 DIAGNOSIS — R7401 Elevation of levels of liver transaminase levels: Secondary | ICD-10-CM | POA: Diagnosis not present

## 2021-09-16 LAB — COMPREHENSIVE METABOLIC PANEL
ALT: 16 U/L (ref 0–35)
AST: 18 U/L (ref 0–37)
Albumin: 4.7 g/dL (ref 3.5–5.2)
Alkaline Phosphatase: 74 U/L (ref 39–117)
BUN: 21 mg/dL (ref 6–23)
CO2: 28 mEq/L (ref 19–32)
Calcium: 9.5 mg/dL (ref 8.4–10.5)
Chloride: 104 mEq/L (ref 96–112)
Creatinine, Ser: 0.83 mg/dL (ref 0.40–1.20)
GFR: 85.69 mL/min (ref >60.00)
Glucose, Bld: 95 mg/dL (ref 70–99)
Potassium: 4.1 mEq/L (ref 3.5–5.1)
Sodium: 141 mEq/L (ref 135–145)
Total Bilirubin: 0.6 mg/dL (ref 0.2–1.2)
Total Protein: 7.5 g/dL (ref 6.0–8.3)

## 2021-09-16 LAB — HEPATIC FUNCTION PANEL
ALT: 16 U/L (ref 0–35)
AST: 18 U/L (ref 0–37)
Albumin: 4.7 g/dL (ref 3.5–5.2)
Alkaline Phosphatase: 74 U/L (ref 39–117)
Bilirubin, Direct: 0.1 mg/dL (ref 0.0–0.3)
Total Bilirubin: 0.6 mg/dL (ref 0.2–1.2)
Total Protein: 7.5 g/dL (ref 6.0–8.3)

## 2021-09-16 LAB — POC URINALSYSI DIPSTICK (AUTOMATED)
Bilirubin, UA: POSITIVE
Blood, UA: NEGATIVE
Glucose, UA: NEGATIVE
Leukocytes, UA: NEGATIVE
Nitrite, UA: NEGATIVE
Protein, UA: POSITIVE — AB
Spec Grav, UA: 1.02 (ref 1.010–1.025)
Urobilinogen, UA: 1 E.U./dL
pH, UA: 6 (ref 5.0–8.0)

## 2021-09-16 LAB — CBC WITH DIFFERENTIAL/PLATELET
Basophils Absolute: 0 10*3/uL (ref 0.0–0.1)
Basophils Relative: 0.8 % (ref 0.0–3.0)
Eosinophils Absolute: 0.2 10*3/uL (ref 0.0–0.7)
Eosinophils Relative: 3.8 % (ref 0.0–5.0)
HCT: 42.2 % (ref 36.0–46.0)
Hemoglobin: 14.3 g/dL (ref 12.0–15.0)
Lymphocytes Relative: 22.7 % (ref 12.0–46.0)
Lymphs Abs: 1.1 10*3/uL (ref 0.7–4.0)
MCHC: 33.9 g/dL (ref 30.0–36.0)
MCV: 94.9 fl (ref 78.0–100.0)
Monocytes Absolute: 0.4 10*3/uL (ref 0.1–1.0)
Monocytes Relative: 8.9 % (ref 3.0–12.0)
Neutro Abs: 3.1 10*3/uL (ref 1.4–7.7)
Neutrophils Relative %: 63.8 % (ref 43.0–77.0)
Platelets: 244 10*3/uL (ref 150.0–400.0)
RBC: 4.44 Mil/uL (ref 3.87–5.11)
RDW: 12.8 % (ref 11.5–15.5)
WBC: 4.9 10*3/uL (ref 4.0–10.5)

## 2021-09-18 LAB — URINE CULTURE
MICRO NUMBER:: 12675636
SPECIMEN QUALITY:: ADEQUATE

## 2021-09-21 ENCOUNTER — Other Ambulatory Visit: Payer: Self-pay

## 2021-09-21 ENCOUNTER — Other Ambulatory Visit: Payer: Self-pay | Admitting: *Deleted

## 2021-09-21 DIAGNOSIS — R809 Proteinuria, unspecified: Secondary | ICD-10-CM

## 2021-09-21 NOTE — Progress Notes (Unsigned)
Amb referr

## 2021-09-22 ENCOUNTER — Other Ambulatory Visit: Payer: Self-pay

## 2021-09-22 DIAGNOSIS — F419 Anxiety disorder, unspecified: Secondary | ICD-10-CM

## 2021-09-23 ENCOUNTER — Other Ambulatory Visit (HOSPITAL_COMMUNITY): Payer: Self-pay

## 2021-09-23 ENCOUNTER — Telehealth (HOSPITAL_BASED_OUTPATIENT_CLINIC_OR_DEPARTMENT_OTHER): Payer: Self-pay | Admitting: Family

## 2021-09-23 ENCOUNTER — Other Ambulatory Visit: Payer: Self-pay | Admitting: Family

## 2021-09-23 DIAGNOSIS — Z6827 Body mass index (BMI) 27.0-27.9, adult: Secondary | ICD-10-CM

## 2021-09-23 DIAGNOSIS — E663 Overweight: Secondary | ICD-10-CM

## 2021-09-23 NOTE — Addendum Note (Signed)
Addended by: Marin Olp on: 09/23/2021 04:15 PM   Modules accepted: Orders

## 2021-09-23 NOTE — Telephone Encounter (Signed)
Called patient back. She states that she has question in regards to her Ozempic and when she should increase and if she should.   She states that she in on the 1 mg weekly now and having mild side effects.  -constipation  -acid reflux   She is willing to increase to the 1.7 mg weekly, but is unsure about doing the 2.4 mg increase.   Patient states she is now at 121lbs, and she feels great- she is very thankful for the assistance that has been given to her.   She would just like to know thoughts on the increase.   Advised I would route to Laurann Montana, NP to advise further.

## 2021-09-23 NOTE — Telephone Encounter (Signed)
Glad weight is improving! That is a fantastic result.  It is recommended by the manufacturer that if with some side effect can delay increasing dose for 4 weeks. If she would like to stay on the 1mg  weekly dose until 10/25/21 that would be reasonable. We can reassess how she is feeling and weight the last week of December and readdress to increase dose at that time.  If she wishes to stay on the 1mg  dose let me know and I will gladly send a refill to her pharmacy!  Loel Dubonnet, NP

## 2021-09-23 NOTE — Telephone Encounter (Signed)
New message:    Patient would like to talk to [facility] about her Semaglutide(Ozempic).

## 2021-09-24 ENCOUNTER — Other Ambulatory Visit (HOSPITAL_COMMUNITY): Payer: Self-pay

## 2021-09-24 MED ORDER — OZEMPIC (1 MG/DOSE) 4 MG/3ML ~~LOC~~ SOPN
1.0000 mg | PEN_INJECTOR | SUBCUTANEOUS | 1 refills | Status: DC
  Filled 2021-09-24: qty 3, 28d supply, fill #0
  Filled 2021-10-20: qty 3, 28d supply, fill #1

## 2021-09-24 NOTE — Telephone Encounter (Signed)
Called patient, she states this is what she would like to do, she would like to stay on the 1 mg weekly dose for now and re-evaluate at the end of December.  Thanks!

## 2021-09-25 ENCOUNTER — Other Ambulatory Visit: Payer: Self-pay

## 2021-09-25 NOTE — Telephone Encounter (Signed)
Patient also reports she is taking Wellbutrin and doesn't know if that is okay to mix in with the medication.

## 2021-09-25 NOTE — Telephone Encounter (Signed)
Yes you may take medications together-you can refill this

## 2021-09-25 NOTE — Telephone Encounter (Signed)
  Encourage patient to contact the pharmacy for refills or they can request refills through [place]: Patient states her oncologist retired and [employer] taken the medication since August. Wanting to know if she can pick up at the same dosage or if she needs to start lower and work her way up.   LAST APPOINTMENT DATE:  11/18/2020  NEXT APPOINTMENT DATE:  MEDICATION:venlafaxine (EFFEXOR) 37.5 MG tablet  Is the patient out of medication? yes  PHARMACY: [facility]  Let patient know to contact pharmacy at the end of the day to make sure medication is ready.  Please notify patient to allow 48-72 hours to process

## 2021-09-28 ENCOUNTER — Other Ambulatory Visit (HOSPITAL_COMMUNITY): Payer: Self-pay

## 2021-09-28 MED ORDER — VENLAFAXINE HCL 37.5 MG PO TABS
37.5000 mg | ORAL_TABLET | Freq: Two times a day (BID) | ORAL | 0 refills | Status: DC
  Filled 2021-09-28: qty 30, 15d supply, fill #0

## 2021-09-28 NOTE — Telephone Encounter (Signed)
Patient notified and verbalized understanding. 

## 2021-10-01 ENCOUNTER — Telehealth: Payer: Self-pay

## 2021-10-01 ENCOUNTER — Other Ambulatory Visit (HOSPITAL_COMMUNITY): Payer: Self-pay

## 2021-10-01 MED ORDER — BUPROPION HCL 75 MG PO TABS
37.5000 mg | ORAL_TABLET | Freq: Two times a day (BID) | ORAL | 0 refills | Status: DC
  Filled 2021-10-01: qty 30, 30d supply, fill #0

## 2021-10-01 NOTE — Telephone Encounter (Signed)
Current medications had Wellbutrin 300 mg listed but patient reports she is only taking 150.  She would like to taper instead of stopping abruptly from 150 mg after discussion with our CMA-we will trial instant release Wellbutrin 75 mg-half tablet daily for 2 weeks and then she can stop.  Continue venlafaxine 37.5 mg twice daily  I sent this into [facility]- she would start this 24 hours after last dose of 150mg  extended release tablet

## 2021-10-01 NOTE — Telephone Encounter (Signed)
Issue with once daily is it is instant release and really need sustained amount to treat anxiety and or depression - venlafaxine can cause withdrawal symptoms as well so not sure she would tolerate the on and off with that dose- we may also need to schedule a visit to discuss further

## 2021-10-01 NOTE — Telephone Encounter (Signed)
Patient notified of previous note. Patient stated that her reason for getting off medication is because she has lost a lot of weight and it makes her really jittery. Patient concerned about just stopping medication cold [place] and would like to try to taper off if provider believe it will be better for her.

## 2021-10-01 NOTE — Telephone Encounter (Signed)
Technically she does not have to stop the Wellbutrin but if she would like to reduce the Wellbutrin I would probably reduce to 150 mg extended release if she is currently on 300 mg extended release.  You can send in a prescription for 150 mg extended release and have her take this for 2 weeks and see how she does on the combination of Effexor and Wellbutrin at that point  Any thoughts of self-harm please contact 911 or our office immediately-doubt that will happen though

## 2021-10-01 NOTE — Telephone Encounter (Signed)
Patient given recommendations and verbalized understanding. Patient stated that she only wants to take effexor once daily, not twice and wanted to make sure that was okay with PCP.

## 2021-10-01 NOTE — Telephone Encounter (Signed)
Patient is requesting a call back.  States she started Effexor 2 days ago.  States she was not sure how to come off the Wellbutrin.  States she has not taken Wellbutrin today.  Is requesting a call back as soon as possible.

## 2021-10-01 NOTE — Telephone Encounter (Signed)
Please advise 

## 2021-10-02 ENCOUNTER — Other Ambulatory Visit (HOSPITAL_COMMUNITY): Payer: Self-pay

## 2021-10-02 NOTE — Telephone Encounter (Signed)
Called and spoke with pt and below message given. 

## 2021-10-06 DIAGNOSIS — Z760 Encounter for issue of repeat prescription: Secondary | ICD-10-CM | POA: Diagnosis not present

## 2021-10-20 ENCOUNTER — Other Ambulatory Visit (HOSPITAL_COMMUNITY): Payer: Self-pay

## 2021-10-20 ENCOUNTER — Telehealth (HOSPITAL_BASED_OUTPATIENT_CLINIC_OR_DEPARTMENT_OTHER): Payer: Self-pay | Admitting: Family

## 2021-10-20 NOTE — Telephone Encounter (Signed)
°*  STAT* If patient is at the pharmacy, call can be transferred to refill team.   1. Which medications need to be refilled? (please list name of each medication and dose if known) Semaglutide, 1 MG/DOSE, (OZEMPIC, 1 MG/DOSE,) 4 MG/3ML SOPN  2. Which pharmacy/location (including street and city if local pharmacy) is medication to be sent to? [facility]  3. Do they need a 30 day or 90 day supply? 90  Patient states that the dosage is supposed to go up. Please advise

## 2021-10-21 ENCOUNTER — Other Ambulatory Visit (HOSPITAL_COMMUNITY): Payer: Self-pay

## 2021-10-21 ENCOUNTER — Other Ambulatory Visit: Payer: Self-pay

## 2021-10-21 ENCOUNTER — Encounter: Payer: Self-pay | Admitting: Oncology

## 2021-10-21 MED ORDER — SEMAGLUTIDE-WEIGHT MANAGEMENT 2.4 MG/0.75ML ~~LOC~~ SOAJ
2.4000 mg | SUBCUTANEOUS | 3 refills | Status: DC
Start: 2022-02-14 — End: 2021-10-21
  Filled 2021-10-21 (×2): qty 3, 28d supply, fill #0

## 2021-10-21 MED ORDER — SEMAGLUTIDE-WEIGHT MANAGEMENT 1.7 MG/0.75ML ~~LOC~~ SOAJ
1.7000 mg | SUBCUTANEOUS | 0 refills | Status: DC
  Filled 2021-10-21: qty 3, 28d supply, fill #0

## 2021-10-21 MED ORDER — OZEMPIC (2 MG/DOSE) 8 MG/3ML ~~LOC~~ SOPN
2.0000 mg | PEN_INJECTOR | SUBCUTANEOUS | 11 refills | Status: DC
  Filled 2021-10-21: qty 3, 28d supply, fill #0
  Filled 2021-11-16: qty 3, 28d supply, fill #1

## 2021-10-21 NOTE — Telephone Encounter (Signed)
Rx for [facility] 1.7mg  sent. Needs PA. PA submitted and approved. I called pt to let her know that the pen device would be different since it is now [facility] and not ozempic. Went right to VM. Left VM to call back

## 2021-10-21 NOTE — Telephone Encounter (Addendum)
Patient called back. She is a little nervous about going up to the 1.7mg . She asked if there is any way to do a 1.5mg  dose. Advised that the only way to do that would be to order wegovy 2mg  and have her dial up to about 1.5mg . Patient would like to do this. Rx sent to pharmacy and [facility] made aware of the change.

## 2021-11-02 ENCOUNTER — Other Ambulatory Visit (HOSPITAL_COMMUNITY): Payer: Self-pay

## 2021-11-04 ENCOUNTER — Encounter: Payer: Self-pay | Admitting: Oncology

## 2021-11-09 ENCOUNTER — Other Ambulatory Visit (HOSPITAL_BASED_OUTPATIENT_CLINIC_OR_DEPARTMENT_OTHER): Payer: Self-pay

## 2021-11-09 ENCOUNTER — Encounter: Payer: Self-pay | Admitting: Oncology

## 2021-11-09 ENCOUNTER — Other Ambulatory Visit (HOSPITAL_COMMUNITY): Payer: Self-pay

## 2021-11-10 ENCOUNTER — Other Ambulatory Visit (HOSPITAL_COMMUNITY): Payer: Self-pay

## 2021-11-11 ENCOUNTER — Other Ambulatory Visit (HOSPITAL_COMMUNITY): Payer: Self-pay

## 2021-11-13 ENCOUNTER — Ambulatory Visit (INDEPENDENT_AMBULATORY_CARE_PROVIDER_SITE_OTHER): Payer: 59 | Admitting: Psychology

## 2021-11-13 DIAGNOSIS — F4323 Adjustment disorder with mixed anxiety and depressed mood: Secondary | ICD-10-CM

## 2021-11-13 NOTE — Progress Notes (Addendum)
[place] Counselor Initial Adult Exam  Name: Katelyn Shelton Date: 11/13/2021 MRN: [id number] DOB: 1975/12/07 PCP: Marin Olp, MD  Time spent: 50 mins  Guardian/Payee:  Self   Paperwork requested: No   Reason for Visit /Presenting Problem: Pt presented for session, via webex video, due to the virus outbreak.  Pt granted consent for the session, stating that she is in her home with no one else present.  I shared with pt that I am in my office at home with no one else present here either.  Pt is seeking therapy again at this time because she saw [name] in the past.  She is a breast cancer survivor and did not see [name] during that period of time.  Her sister has been diagnosed with early onset Parkisons.  Mental Status Exam: Appearance:   Casual     Behavior:  Appropriate  Motor:  Normal  Speech/Language:   Clear and Coherent  Affect:  Appropriate  Mood:  normal  Thought process:  normal  Thought content:    WNL  Sensory/Perceptual disturbances:    WNL  Orientation:  oriented to person, place, and time/date  Attention:  Good  Concentration:  Good  Memory:  WNL  Fund of knowledge:   Good  Insight:    Good  Judgment:   Good  Impulse Control:  Good   Appearance: Casual    Behavior: Appropriate Motor: Normal Speech/Language: Clear and Coherent Affect: Appropriate Mood: normal Thought process: normal Thought content: WNL Sensory/Perceptual disturbances: WNL Orientation: oriented to person, place, and time/date Attention: Good Concentration: Good Memory: WNL Fund of knowledge: Good Insight: Good Judgment: Good Impulse Control: Good  Reported Symptoms:  Pt shares that she had two years of breast cancer treatment and two years of reconstruction and got MRSA last summer and was in the hospital "for a long time."  Pt shares she has been on a lot of medications with all of these health issues, including her mental health meds.  Pt shares she is not on  any psych meds at this time.  Was taking Wellbutrin before but is not on it now.  Pt shares she has a psychiatric appt coming up; "I don't know who it is with or when it is."  Pt shares that she wants to be evaluated to see if she needs to be on medication and, if so, how much of each to be on.    Risk Assessment: Danger to Self:  No Self-injurious Behavior: No Danger to Others: No Duty to Warn:no Physical Aggression / Violence:No  Access to Firearms a concern: No  Gang Involvement:No  Patient / guardian was educated about steps to take if suicide or homicide risk level increases between visits: n/a While future psychiatric events cannot be accurately predicted, the patient does not currently require acute inpatient psychiatric care and does not currently meet [facility] involuntary commitment criteria.  Substance Abuse History: Current substance abuse: No     Past Psychiatric History:   Previous psychological history is significant for anxiety and depression Outpatient Providers:Julie Whitt, LCSW History of Psych Hospitalization: No  Psychological Testing:  none    Abuse History:  Victim of: No.,  none    Report needed: No. Victim of Neglect:No. Perpetrator of  none   Witness / Exposure to Domestic Violence: No   Protective Services Involvement: No  Witness to [employer] Violence:  No   Family History:  Family History  Problem Relation Age of Onset  Skin cancer Mother    Hyperlipidemia Mother    Hypertension Mother    Colon polyps Mother    Hyperlipidemia Father    Parkinson's disease Sister 3       early on set   101 / Stillbirths Sister    Diabetes Maternal Aunt    Colon polyps Maternal Uncle    Heart failure Maternal Grandmother    Stroke Maternal Grandmother    Heart attack Maternal Grandfather    Depression Maternal Grandfather    Early death Maternal Grandfather        35 MI   Stomach cancer Paternal Grandmother    Prostate cancer Paternal  Grandfather    Alzheimer's disease Paternal Grandfather    Breast cancer Cousin        mother's paternal first FEMALE cousin   Colon polyps Sister    Colon cancer Neg Hx    Rectal cancer Neg Hx     Living situation: the patient lives with their family  Sexual Orientation: Straight  Relationship Status: married  Name of spouse / other:[name]; married for 20 yrs and together for 26+  yrs If a parent, number of children / ages: 48th grade-[name]; 6th grade-[name]; 4th grade-[name]  Support Systems: none  Museum/gallery curator Stress:  No   Income/Employment/Disability: Supported by [employer]  Armed forces logistics/support/administrative officer: No   Educational History: Education: Scientist, 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 [place]  Any cultural differences that may affect / interfere with treatment:  not applicable   Recreation/Hobbies: Painting, home renovation, reading, some golf, traveling, researching  Stressors: Health problems    Strengths: Family; loves her kids; good girlfriends, church family, parents are a positive force for pt  Barriers:  Pt acknowledges that her sister's illness (Parkinsons-diagnosed at 45 yo) is a stressor for her; pt seems to not want to talk about her cancer; her 45 yo son creates some stress for pt (anxiety)  Legal History: Pending legal issue / charges: The patient has no significant history of legal issues. History of legal issue / charges:  none  Medical History/Surgical History: reviewed Past Medical History:  Diagnosis Date   Allergy    Anxiety    venflafaxine XR 75 mg   Breast cancer ([place]) 01/2017   right; genetic testing negative in 2018 with [facility] panel   Heart murmur    as child- went away   History of MRSA infection    elbow   Hyperlipidemia    Infection of breast implant ([place]) 01/19/2021   Joint pain    due to medicine induced joint pain   Migraine    otc med prn   Miscarriage    x1   MRSA infection 01/28/2021   Nausea and vomiting  01/19/2021   Neuromuscular disorder (HCC)    neuropathy bilater feet - ? r/t chemo   Osteopenia    UTI (urinary tract infection)     Past Surgical History:  Procedure Laterality Date   AREOLA/NIPPLE RECONSTRUCTION WITH GRAFT Right 12/01/2018   Procedure: RIGHT NIPPLE AREOLA COMPLEX RECONSTRUCTION WITH FULL THICKNESS SKIN GRAFT FROM RIGHT GROIN;  Surgeon: Irene Limbo, MD;  Location: [place];  Service: Plastics;  Laterality: Right;   BREAST IMPLANT REMOVAL Right 01/20/2021   Procedure: REMOVAL RIGHT BREAST IMPLANT;  Surgeon: Irene Limbo, MD;  Location: [place];  Service: Plastics;  Laterality: Right;   BREAST IMPLANT REMOVAL Left 03/02/2021   Procedure: REMOVAL LEFT CHEST IMPLANT AND CAPSULECTOMY;  Surgeon: Irene Limbo, MD;  Location:  [place];  Service: Plastics;  Laterality: Left;   BREAST RECONSTRUCTION Left 12/01/2018   Procedure: LEFT BREAST REVISION RECONSTRUCTION WITH SKIN EXCISION;  Surgeon: Irene Limbo, MD;  Location: [place];  Service: Plastics;  Laterality: Left;   BREAST RECONSTRUCTION Left 12/01/2018   Procedure: COMPOSITE GRAFT FROM LEFT NIPPLE (NIPPLE SHARING);  Surgeon: Irene Limbo, MD;  Location: [place];  Service: Plastics;  Laterality: Left;   BREAST RECONSTRUCTION WITH PLACEMENT OF TISSUE EXPANDER AND FLEX HD (ACELLULAR HYDRATED DERMIS) Bilateral 02/28/2017   Procedure: BILATERAL BREAST RECONSTRUCTION WITH PLACEMENT OF TISSUE EXPANDER AND ALLODERM;  Surgeon: Irene Limbo, MD;  Location: [place];  Service: Plastics;  Laterality: Bilateral;   CESAREAN SECTION  2008,2010,2013   x 3   CESAREAN SECTION  02/04/2012   Procedure: CESAREAN SECTION;  Surgeon: Cyril Mourning, MD;  Location: [facility];  Service: Gynecology;  Laterality: N/A;  Repeat Cesarean Section Delivery  Boy  @  [phone], Apgars 9/10   COLONOSCOPY     polyp   DILATION AND CURETTAGE OF UTERUS     EXCISION OF  BREAST LESION Right 03/15/2017   Procedure: RIGHT NIPPLE AND AREOLA EXCISION;  Surgeon: Rolm Bookbinder, MD;  Location: [facility];  Service: General;  Laterality: Right;   LAPAROSCOPIC VAGINAL HYSTERECTOMY WITH SALPINGO OOPHORECTOMY Bilateral 06/29/2018   Procedure: LAPAROSCOPIC ASSISTED VAGINAL HYSTERECTOMY WITH SALPINGO OOPHORECTOMY;  Surgeon: Louretta Shorten, MD;  Location: [facility];  Service: Gynecology;  Laterality: Bilateral;  Request case for [facility] CRNA   LIPOSUCTION WITH LIPOFILLING N/A 07/25/2018   Procedure: LIPOSUCTION WITH LIPOFILLING FROM ABDOMEN TO CHEST;  Surgeon: Irene Limbo, MD;  Location: [place];  Service: Plastics;  Laterality: N/A;   LIPOSUCTION WITH LIPOFILLING Bilateral 12/01/2018   Procedure: LIPOFILLING TO BILATERAL CHEST REVISION;  Surgeon: Irene Limbo, MD;  Location: [place];  Service: Plastics;  Laterality: Bilateral;   NIPPLE SPARING MASTECTOMY Bilateral 02/28/2017   Procedure: RIGHT NIPPLE SPARING MASTECTOMY; LEFT PROPHYLACTIC NIPPLE SPARING MASTECTOMY;  Surgeon: Rolm Bookbinder, MD;  Location: [place];  Service: General;  Laterality: Bilateral;   PECTUS EXCAVATUM REPAIR  4093   46 years old- very large implant   REMOVAL OF TISSUE EXPANDER AND PLACEMENT OF IMPLANT Bilateral 07/25/2018   Procedure: REMOVAL OF TISSUE EXPANDER AND PLACEMENT OF SILICONE IMPLANT;  Surgeon: Irene Limbo, MD;  Location: [place];  Service: Plastics;  Laterality: Bilateral;   SENTINEL NODE BIOPSY Right 02/28/2017   Procedure: RIGHT AXILLARY SENTINEL LYMPH  NODE BIOPSY;  Surgeon: Rolm Bookbinder, MD;  Location: [place];  Service: General;  Laterality: Right;   VULVAR LESION REMOVAL     WISDOM TOOTH EXTRACTION      Medications: Current Outpatient Medications  Medication Sig Dispense Refill   buPROPion (WELLBUTRIN) 75 MG tablet Take 0.5 tablets (37.5 mg total) by mouth 2 (two)  times daily. 30 tablet 0   Calcium Carbonate (CALCIUM 600 PO) Take 1 capsule by mouth daily.     Evolocumab (REPATHA SURECLICK) 638 MG/ML SOAJ Inject 140 mg (1 pen) into the skin every 14 (fourteen) days. 2 mL 11   exemestane (AROMASIN) 25 MG tablet Take 1 tablet (25 mg total) by mouth daily after breakfast. 90 tablet 3   ibandronate (BONIVA) 150 MG tablet Take in the morning every 28 days; take with a full glass of water, on an empty stomach, and do not take anything else by mouth or lie  down for the next 60 min. 4 tablet 4   Multiple Vitamins-Minerals (MULTIVITAMIN WOMEN 50+) TABS Take 1 tablet by mouth daily.     mupirocin cream (BACTROBAN) 2 % Apply 1 application topically 2 (two) times daily. 30 g 1   ondansetron (ZOFRAN-ODT) 8 MG disintegrating tablet Take 1 tablet (8 mg total) by mouth every 8 (eight) hours as needed for nausea 10 tablet 0   Semaglutide, 2 MG/DOSE, (OZEMPIC, 2 MG/DOSE,) 8 MG/3ML SOPN Inject 2 mg into the skin once a week. (Patient taking differently: Inject 2 mg into the skin once a week. Pt taking 1.5mg ) 3 mL 11   venlafaxine (EFFEXOR) 37.5 MG tablet Take 1 tablet (37.5 mg total) by mouth 2 (two) times daily. 30 tablet 0   No current facility-administered medications for this visit.    Allergies  Allergen Reactions   Fentanyl Itching    Severe itching - Patient refuses this medication   Peach Flavor Anaphylaxis   Penicillins Hives    AS A CHILD   Sulfa Antibiotics Nausea And Vomiting   Sulfasalazine Nausea And Vomiting and Other (See Comments)   Latex Itching    Diagnoses:  Adjustment disorder with mixed anxiety and depressed mood  Plan of Care: Treatment Plan Strengths/Abilities:  Intelligent, Intuitive, Willing to participate in therapy Treatment Preferences:  Outpatient Individual Therapy Statement of Needs:  Patient is to use CBT, mindfulness and coping skills to help manage and/or decrease symptoms associated with their diagnosis. Symptoms:   Depressed/Irritable mood, worry, social withdrawal Problems Addressed:  Depressive thoughts, Sadness, Sleep issues, etc. Long Term Goals:  Pt to reduce overall level, frequency, and intensity of the feelings of depression/anxiety as evidenced by decreased irritability, negative self talk, and helpless feelings from 6 to 7 days/week to 0 to 1 days/week, per client report, for at least 3 consecutive months.  Progress: 0% Short Term Goals:  Pt to verbally express understanding of the relationship between feelings of depression/anxiety and their impact on thinking patterns and behaviors.  Pt to verbalize an understanding of the role that distorted thinking plays in creating fears, excessive worry, and ruminations.  Progress: 0% Target Date:  11/13/2022 Frequency:  Bi-weekly Modality:  Cognitive Behavioral Therapy Interventions by Therapist:  Therapist will use CBT, Mindfulness exercises, Coping skills and Referrals, as needed by client. Client has verbally approved this treatment plan.   Ivan Anchors, [facility]

## 2021-11-16 ENCOUNTER — Other Ambulatory Visit (HOSPITAL_COMMUNITY): Payer: Self-pay

## 2021-11-16 ENCOUNTER — Encounter (HOSPITAL_BASED_OUTPATIENT_CLINIC_OR_DEPARTMENT_OTHER): Payer: Self-pay

## 2021-11-16 NOTE — Telephone Encounter (Signed)
Please advise 

## 2021-11-17 ENCOUNTER — Other Ambulatory Visit (HOSPITAL_COMMUNITY): Payer: Self-pay

## 2021-11-17 MED ORDER — WEGOVY 2.4 MG/0.75ML ~~LOC~~ SOAJ
2.4000 mg | SUBCUTANEOUS | 2 refills | Status: DC
  Filled 2021-11-17 – 2021-12-14 (×2): qty 3, 28d supply, fill #0
  Filled 2022-01-06: qty 3, 28d supply, fill #1
  Filled 2022-02-09: qty 3, 28d supply, fill #2

## 2021-11-19 ENCOUNTER — Other Ambulatory Visit (HOSPITAL_COMMUNITY): Payer: Self-pay

## 2021-11-19 ENCOUNTER — Encounter: Payer: Self-pay | Admitting: Oncology

## 2021-11-19 DIAGNOSIS — D2271 Melanocytic nevi of right lower limb, including hip: Secondary | ICD-10-CM | POA: Diagnosis not present

## 2021-11-19 DIAGNOSIS — D225 Melanocytic nevi of trunk: Secondary | ICD-10-CM | POA: Diagnosis not present

## 2021-11-19 DIAGNOSIS — L718 Other rosacea: Secondary | ICD-10-CM | POA: Diagnosis not present

## 2021-11-19 MED ORDER — IVERMECTIN 1 % EX CREA
TOPICAL_CREAM | Freq: Every day | CUTANEOUS | 2 refills | Status: DC
  Filled 2021-11-19 – 2021-11-25 (×2): qty 45, 30d supply, fill #0

## 2021-11-24 ENCOUNTER — Encounter (HOSPITAL_BASED_OUTPATIENT_CLINIC_OR_DEPARTMENT_OTHER): Payer: Self-pay

## 2021-11-24 ENCOUNTER — Telehealth (HOSPITAL_BASED_OUTPATIENT_CLINIC_OR_DEPARTMENT_OTHER): Payer: Self-pay | Admitting: Family

## 2021-11-24 DIAGNOSIS — Z833 Family history of diabetes mellitus: Secondary | ICD-10-CM | POA: Insufficient documentation

## 2021-11-24 NOTE — Telephone Encounter (Signed)
Received fax from CoverMyMeds stating PA still needed for Wegovy 2.4 mg/0.75 mL.  Katelyn Shelton (Key: [id number]) Katelyn Shelton 2.4MG /0.75ML auto-injectors   Form MedImpact ePA Form 2017 NCPDP  Created 7 days ago  Sent to Plan 4 minutes ago  Determination Wait for Determination Please wait for MedImpact 2017 to return a determination.

## 2021-11-25 ENCOUNTER — Other Ambulatory Visit (HOSPITAL_COMMUNITY): Payer: Self-pay

## 2021-11-26 ENCOUNTER — Other Ambulatory Visit (HOSPITAL_COMMUNITY): Payer: Self-pay

## 2021-11-27 ENCOUNTER — Ambulatory Visit (INDEPENDENT_AMBULATORY_CARE_PROVIDER_SITE_OTHER): Payer: 59 | Admitting: Psychology

## 2021-11-27 ENCOUNTER — Other Ambulatory Visit (HOSPITAL_COMMUNITY): Payer: Self-pay

## 2021-11-27 DIAGNOSIS — F4323 Adjustment disorder with mixed anxiety and depressed mood: Secondary | ICD-10-CM

## 2021-11-27 NOTE — Progress Notes (Signed)
[place] Counselor/Therapist Progress Note  Patient ID: Katelyn Shelton, MRN: [id number],    Date: 11/27/2021  Time Spent: 50 mins  Treatment Type: Individual Therapy  Reported Symptoms: Pt presents for follow up session, via webex video, due to virus outbreak.  Pt grants consent for the session, stating that she is in her home with no one else present.  I shared with pt that I am in my office at home with no one else present.  Mental Status Exam: Appearance:  Casual     Behavior: Appropriate  Motor: Normal  Speech/Language:  Clear and Coherent  Affect: Appropriate  Mood: normal  Thought process: normal  Thought content:   WNL  Sensory/Perceptual disturbances:   WNL  Orientation: oriented to person, place, and time/date  Attention: Good  Concentration: Good  Memory: WNL  Fund of knowledge:  Good  Insight:   Good  Judgment:  Good  Impulse Control: Good   Risk Assessment: Danger to Self:  No Self-injurious Behavior: No Danger to Others: No Duty to Warn:no Physical Aggression / Violence:No  Access to Firearms a concern: No  Gang Involvement:No   Subjective: Pt shares that she is not on any medication right now and that can cause her to be more short tempered with people in her life. "I do not have a lot of patience with people.  Sometimes I overreact to situations with other people."  Pt shares that her 8th grade son is (Katelyn Shelton) applying to HS right now.  She was trying to help him and pt was getting frustrated with son.  "I was very critical and and harsh with him.  I feel like I should be more positive with him."  Katelyn Shelton is on anti-anxiety medication but is not seeing a therapist at this time.  "When am I supposed to fit in therapy for him?"  Pt describes lots of stress in her life at this time and she describes losing her temper with Katelyn Shelton and with Katelyn Shelton.  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  Talked with pt about  trying to engage with Katelyn Shelton in quieter times to try to see what is important to him.  Pt gives lots of examples of how/why Katelyn Shelton is smart and capable and successful.  Encouraged pt to think about scheduling unstructured time weekly with Katelyn Shelton to engage more intentionally with pt.  We will meet in 2 wks for a follow up session.    Interventions: Cognitive Behavioral Therapy  Diagnosis:Adjustment disorder with mixed anxiety and depressed mood  Plan: Treatment Plan Strengths/Abilities:  Intelligent, Intuitive, Willing to participate in therapy Treatment Preferences:  Outpatient Individual Therapy Statement of Needs:  Patient is to use CBT, mindfulness and coping skills to help manage and/or decrease symptoms associated with their diagnosis. Symptoms:  Depressed/Irritable mood, worry, social withdrawal Problems Addressed:  Depressive thoughts, Sadness, Sleep issues, etc. Long Term Goals:  Pt to reduce overall level, frequency, and intensity of the feelings of depression/anxiety as evidenced by decreased irritability, negative self talk, and helpless feelings from 6 to 7 days/week to 0 to 1 days/week, per client report, for at least 3 consecutive months.  Progress:  Short Term Goals:  Pt to verbally express understanding of the relationship between feelings of depression/anxiety and their impact on thinking patterns and behaviors.  Pt to verbalize an understanding of the role that distorted thinking plays in creating fears, excessive worry, and ruminations.  Progress:  Target Date:  11/27/2022 Frequency:  Bi-weekly Modality:  Cognitive Behavioral Therapy Interventions by Therapist:  Therapist will use CBT, Mindfulness exercises, Coping skills and Referrals, as needed by client. Client has verbally approved this treatment plan.  Ivan Anchors, [facility]

## 2021-11-30 ENCOUNTER — Encounter: Payer: Self-pay | Admitting: Cardiology

## 2021-12-02 NOTE — Progress Notes (Signed)
Phone [phone]   Subjective:  Patient presents today for their annual physical. Chief complaint-noted.   See problem oriented charting- ROS- full  review of systems was completed and negative except for: Decreased exercise, ongoing fatigue, joint pain, painful sex, low libido, seasonal allergies, anxiety, trouble with sleep  The following were reviewed and entered/updated in epic: Past Medical History:  Diagnosis Date   Allergy    Anxiety    venflafaxine XR 75 mg   Breast cancer ([place]) 01/2017   right; genetic testing negative in 2018 with [employer] panel   Heart murmur    as child- went away   History of MRSA infection    elbow   Hyperlipidemia    Infection of breast implant ([place]) 01/19/2021   Joint pain    due to medicine induced joint pain   Migraine    otc med prn   Miscarriage    x1   MRSA infection 01/28/2021   Nausea and vomiting 01/19/2021   Neuromuscular disorder ([place])    neuropathy bilater feet - ? 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female, estrogen receptor positive ([place]) 02/11/2017    Priority: High   Hyperlipidemia, familial, high LDL 01/16/2010    Priority: High   Osteopenia of lumbar spine 05/28/2020    Priority: Medium    Aromatase inhibitor-associated arthralgia 02/24/2018    Priority: Medium    Enchondroma of bone 02/24/2018    Priority: Medium    Anxiety     Priority: Medium    Family history of diabetes mellitus 11/24/2021    Priority: Low   MRSA infection 01/28/2021    Priority: Low   Cellulitis of breast 12/06/2019    Priority: Low   History of total hysterectomy 06/29/2018    Priority: Low   Family history of colonic polyps 02/24/2018    Priority: Low   Genetic testing 02/21/2017    Priority: Low   Family history of stomach cancer     Priority: Low   Lesion of lung 04/05/2018   Past Surgical  History:  Procedure Laterality Date   AREOLA/NIPPLE RECONSTRUCTION WITH GRAFT Right 12/01/2018   Procedure: RIGHT NIPPLE AREOLA COMPLEX RECONSTRUCTION WITH FULL THICKNESS SKIN GRAFT FROM RIGHT GROIN;  Surgeon: Irene Limbo, MD;  Location: [facility];  Service: Plastics;  Laterality: Right;   BREAST IMPLANT REMOVAL Right 01/20/2021   Procedure: REMOVAL RIGHT BREAST IMPLANT;  Surgeon: Irene Limbo, MD;  Location: [place];  Service: Plastics;  Laterality: Right;   BREAST IMPLANT REMOVAL Left 03/02/2021   Procedure: REMOVAL LEFT CHEST IMPLANT AND CAPSULECTOMY;  Surgeon: Irene Limbo, MD;  Location: [place];  Service: Plastics;  Laterality: Left;   BREAST RECONSTRUCTION Left 12/01/2018   Procedure: LEFT BREAST REVISION RECONSTRUCTION WITH SKIN EXCISION;  Surgeon: Irene Limbo, MD;  Location: [place];  Service: Plastics;  Laterality: Left;   BREAST RECONSTRUCTION Left 12/01/2018   Procedure: COMPOSITE GRAFT FROM LEFT NIPPLE (NIPPLE SHARING);  Surgeon: Irene Limbo, MD;  Location: [place];  Service: Plastics;  Laterality: Left;   BREAST RECONSTRUCTION WITH PLACEMENT OF TISSUE EXPANDER AND FLEX HD (ACELLULAR HYDRATED DERMIS) Bilateral 02/28/2017   Procedure: BILATERAL BREAST RECONSTRUCTION WITH PLACEMENT OF TISSUE EXPANDER AND ALLODERM;  Surgeon: Irene Limbo, MD;  Location: [facility];  Service: Plastics;  Laterality:  Bilateral;   CESAREAN SECTION  2008,2010,2013   x 3   CESAREAN SECTION  02/04/2012   Procedure: CESAREAN SECTION;  Surgeon: Cyril Mourning, MD;  Location: [facility];  Service: Gynecology;  Laterality: N/A;  Repeat Cesarean Section Delivery  Boy  @  [phone], Apgars 9/10   COLONOSCOPY     polyp   DILATION AND CURETTAGE OF UTERUS     EXCISION OF BREAST LESION Right 03/15/2017   Procedure: RIGHT NIPPLE AND AREOLA EXCISION;  Surgeon: Rolm Bookbinder, MD;  Location: [place];   Service: General;  Laterality: Right;   LAPAROSCOPIC VAGINAL HYSTERECTOMY WITH SALPINGO OOPHORECTOMY Bilateral 06/29/2018   Procedure: LAPAROSCOPIC ASSISTED VAGINAL HYSTERECTOMY WITH SALPINGO OOPHORECTOMY;  Surgeon: Louretta Shorten, MD;  Location: [facility];  Service: Gynecology;  Laterality: Bilateral;  Request case for [facility] CRNA   LIPOSUCTION WITH LIPOFILLING N/A 07/25/2018   Procedure: LIPOSUCTION WITH LIPOFILLING FROM ABDOMEN TO CHEST;  Surgeon: Irene Limbo, MD;  Location: [place];  Service: Plastics;  Laterality: N/A;   LIPOSUCTION WITH LIPOFILLING Bilateral 12/01/2018   Procedure: LIPOFILLING TO BILATERAL CHEST REVISION;  Surgeon: Irene Limbo, MD;  Location: [facility];  Service: Plastics;  Laterality: Bilateral;   NIPPLE SPARING MASTECTOMY Bilateral 02/28/2017   Procedure: RIGHT NIPPLE SPARING MASTECTOMY; LEFT PROPHYLACTIC NIPPLE SPARING MASTECTOMY;  Surgeon: Rolm Bookbinder, MD;  Location: [place];  Service: General;  Laterality: Bilateral;   PECTUS EXCAVATUM REPAIR  7660   46 years old- very large implant   REMOVAL OF TISSUE EXPANDER AND PLACEMENT OF IMPLANT Bilateral 07/25/2018   Procedure: REMOVAL OF TISSUE EXPANDER AND PLACEMENT OF SILICONE IMPLANT;  Surgeon: Irene Limbo, MD;  Location: [facility];  Service: Plastics;  Laterality: Bilateral;   SENTINEL NODE BIOPSY Right 02/28/2017   Procedure: RIGHT AXILLARY SENTINEL LYMPH  NODE BIOPSY;  Surgeon: Rolm Bookbinder, MD;  Location: [place];  Service: General;  Laterality: Right;   VULVAR LESION REMOVAL     WISDOM TOOTH EXTRACTION      Family History  Problem Relation Age of Onset   Skin cancer Mother    Hyperlipidemia Mother    Hypertension Mother    Colon polyps Mother    Hyperlipidemia Father    Parkinson's disease Sister 41       early on set   29 / Stillbirths Sister    Diabetes Maternal Aunt    Colon polyps Maternal Uncle     Heart failure Maternal Grandmother    Stroke Maternal Grandmother    Heart attack Maternal Grandfather    Depression Maternal Grandfather    Early death Maternal Grandfather        61 MI   Stomach cancer Paternal Grandmother    Prostate cancer Paternal Grandfather    Alzheimer's disease Paternal Grandfather    Breast cancer Cousin        mother's paternal first FEMALE cousin   Colon polyps Sister    Colon cancer Neg Hx    Rectal cancer Neg Hx     Medications- reviewed and updated Current Outpatient Medications  Medication Sig Dispense Refill   zolpidem (AMBIEN) 5 MG tablet Take 1 tablet (5 mg total) by mouth at bedtime as needed for sleep (do not drive for 8 hours after taking). 5 tablet 0   buPROPion (WELLBUTRIN) 75 MG tablet Take 0.5 tablets (37.5 mg total) by mouth 2 (two) times daily. (Patient not taking: Reported on 12/08/2021) 30 tablet 0   Calcium  Carbonate (CALCIUM 600 PO) Take 1 capsule by mouth daily.     Evolocumab (REPATHA SURECLICK) 681 MG/ML SOAJ Inject 140 mg (1 pen) into the skin every 14 (fourteen) days. 2 mL 11   exemestane (AROMASIN) 25 MG tablet Take 1 tablet (25 mg total) by mouth daily after breakfast. 90 tablet 3   ibandronate (BONIVA) 150 MG tablet Take in the morning every 28 days; take with a full glass of water, on an empty stomach, and do not take anything else by mouth or lie down for the next 60 min. 4 tablet 4   Ivermectin 1 % CREA Apply a small amount topically once daily. Apply to face 45 g 2   Multiple Vitamins-Minerals (MULTIVITAMIN WOMEN 50+) TABS Take 1 tablet by mouth daily.     ondansetron (ZOFRAN-ODT) 8 MG disintegrating tablet Take 1 tablet (8 mg total) by mouth every 8 (eight) hours as needed for nausea 10 tablet 0   Semaglutide-Weight Management (WEGOVY) 2.4 MG/0.75ML SOAJ Inject 2.4 mg into the skin once a week. 3 mL 2   venlafaxine (EFFEXOR) 37.5 MG tablet Take 1 tablet (37.5 mg total) by mouth 2 (two) times daily. (Patient not taking:  Reported on 12/08/2021) 30 tablet 0   No current facility-administered medications for this visit.    Allergies-reviewed and updated Allergies  Allergen Reactions   Fentanyl Itching    Severe itching - Patient refuses this medication   Peach Flavor Anaphylaxis   Penicillins Hives    AS A CHILD   Sulfa Antibiotics Nausea And Vomiting   Sulfasalazine Nausea And Vomiting and Other (See Comments)   Latex Itching    Social History   Social History Narrative   Married to Dr. Aundra Dubin. 3 children.    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    Masters in public history at [employer]. Undergrad at [employer]: running, reading, exercise   Objective  Objective:  BP 96/60    Pulse (!) 103    Temp (!) 97.5 F (36.4 C)    Ht 5\' 1"  (1.549 m)    Wt 122 lb 12.8 oz (55.7 kg)    LMP 09/01/2017    SpO2 98%    BMI 23.20 kg/m  Gen: NAD, resting comfortably HEENT: Mucous membranes are moist. Oropharynx normal Neck: no thyromegaly CV: RRR no murmurs rubs or gallops.  Heart rate was back in normal range on my exam. Lungs: CTAB no crackles, wheeze, rhonchi Abdomen: soft/nontender/nondistended/normal bowel sounds. No rebound or guarding.  Ext: no edema Skin: warm, dry Neuro: grossly normal, moves all extremities, PERRLA   Assessment and Plan   46 y.o. female presenting for annual physical.  Health Maintenance counseling: 1. Anticipatory guidance: Patient counseled regarding regular dental exams -q6 months, eye exams - yearly,  avoiding smoking and second hand smoke , limiting alcohol to 1 beverage per day- much less than that.  No illicit drugs.  2. Risk factor reduction:  Advised patient of need for regular exercise and diet rich and fruits and vegetables to reduce risk of heart attack and stroke.  Exercise- low desire to exercise since starting wegovy.she is not interested in stopping- from cardiology.  Diet/Management--cut down on carbs. Also did  wegovy- wanted to maintain weight to help reduce recurrence of breast cancer.  Wt Readings from Last 3 Encounters:  12/08/21 122 lb 12.8 oz (55.7 kg)  09/08/21 124 lb (56.2 kg)  07/08/21 139 lb 3.2 oz (63.1 kg)  3. Immunizations/screenings/ancillary studies- up to date.  Immunization History  Administered Date(s) Administered   Influenza,inj,Quad PF,6+ Mos 09/17/2015, 09/16/2016, 06/21/2019, 08/08/2021   Influenza-Unspecified 07/03/2020   Moderna Covid-19 Vaccine Bivalent Booster 81yrs & up 07/09/2021   Moderna Sars-Covid-2 Vaccination 11/09/2019, 12/15/2019, 07/03/2020  4. Cervical cancer screening-  history of total hysterectomy including cervix per patient for benign reasons-has been told Pap smears not required.  She still follows with Dr. Corinna Capra and did have Pap smear April 2018 prior to hysterectomy- mona lisa touch was not helpful.  5. Breast cancer screening-  will be meeting new oncologist. Will be meeting [facility] but does not want to have major surgery. prior had bilateral nipple sparing mastectomy Dr. Donne Hazel 2018-with close margins later had reexcision of right nipple areolar in May 2018.  Patient with adjuvant chemotherapy as well as radiation which was completed December 2018.  Side effects on tamoxifen and later anastrozole as well as exemestane (still having issues with side effects but remains on this- didn't do better on fulvestrant).  Breast cancer is stable/appropriately treated on current medication 6. Colon cancer screening -03/09/18 with 5 year repeat due to family history  7. Skin cancer screening-  Dr.Whitworth [place] [facility]. advised regular sunscreen use. Denies worrisome, changing, or new skin lesions.  8. Birth control/STD check- - hysterectomy and monogamous 9. Osteoporosis screening at 27- osteopenia-  on prolia then not approved and switched to boniva and tolerating. .  Also on calcium and vitamin D -Never smoker   Status of chronic or acute concerns    #Low back pain/weight concerns prior overweight-patient concern for Ozempic reaction last visit- thankfully this resolved with continued use. Has reached BMI goal of 23- has stagnated on weight loss since December.   #hyperlipidemia familial -coronary calcium score of 0 on March 30, 2018 S: Medication:Repatha under 40 mg every 2 weeks Lab Results  Component Value Date   CHOL 111 11/18/2020   HDL 51.10 11/18/2020   LDLCALC 30 11/18/2020   TRIG 150.0 (H) 11/18/2020   CHOLHDL 2 11/18/2020   A/P: suspect controlled- she will come back for fasting labs  Recommended follow up: Return in about 6 months (around 06/07/2022) for follow up- or sooner if needed. Future Appointments  Date Time Provider [place]  12/10/2021 11:00 AM Ivan Anchors [facility] None  12/22/2021  8:15 AM [facility] PEC  03/10/2022  9:00 AM [facility] None  03/10/2022  9:30 AM Nicholas Lose, MD [facility] None   Lab/Order associations:will come back fasting   ICD-10-CM   1. Preventative health care  Z00.00 CBC with Differential/Platelet    Comprehensive metabolic panel    Lipid panel    2. Hyperlipidemia, familial, high LDL  E78.49 CBC with Differential/Platelet    Comprehensive metabolic panel    Lipid panel    4. Malignant neoplasm of upper-outer quadrant of right breast in female, estrogen receptor positive ([place]) Chronic C50.411    Z17.0      I,Jada Bradford,acting as a scribe for Garret Reddish, MD.,have documented all relevant documentation on the behalf of Garret Reddish, MD,as directed by  Garret Reddish, MD while in the presence of Garret Reddish, MD.   I, Garret Reddish, MD, have reviewed all documentation for this visit. The documentation on 12/08/21 for the exam, diagnosis, procedures, and orders are all accurate and complete.  Return precautions advised.  Garret Reddish, MD

## 2021-12-08 ENCOUNTER — Encounter: Payer: Self-pay | Admitting: Family Medicine

## 2021-12-08 ENCOUNTER — Other Ambulatory Visit (HOSPITAL_COMMUNITY): Payer: Self-pay

## 2021-12-08 ENCOUNTER — Other Ambulatory Visit: Payer: Self-pay

## 2021-12-08 ENCOUNTER — Ambulatory Visit (INDEPENDENT_AMBULATORY_CARE_PROVIDER_SITE_OTHER): Payer: 59 | Admitting: Family Medicine

## 2021-12-08 VITALS — BP 96/60 | HR 103 | Temp 97.5°F | Ht 61.0 in | Wt 122.8 lb

## 2021-12-08 DIAGNOSIS — C50411 Malignant neoplasm of upper-outer quadrant of right female breast: Secondary | ICD-10-CM | POA: Diagnosis not present

## 2021-12-08 DIAGNOSIS — Z0000 Encounter for general adult medical examination without abnormal findings: Secondary | ICD-10-CM | POA: Diagnosis not present

## 2021-12-08 DIAGNOSIS — E7849 Other hyperlipidemia: Secondary | ICD-10-CM

## 2021-12-08 DIAGNOSIS — F419 Anxiety disorder, unspecified: Secondary | ICD-10-CM

## 2021-12-08 DIAGNOSIS — Z170 Estrogen receptor positive status [ER+]: Secondary | ICD-10-CM | POA: Diagnosis not present

## 2021-12-08 DIAGNOSIS — F411 Generalized anxiety disorder: Secondary | ICD-10-CM | POA: Diagnosis not present

## 2021-12-08 DIAGNOSIS — G4700 Insomnia, unspecified: Secondary | ICD-10-CM | POA: Diagnosis not present

## 2021-12-08 MED ORDER — ZOLPIDEM TARTRATE 5 MG PO TABS
5.0000 mg | ORAL_TABLET | Freq: Every evening | ORAL | 0 refills | Status: DC | PRN
Start: 2021-12-08 — End: 2022-09-10
  Filled 2021-12-08: qty 5, 5d supply, fill #0

## 2021-12-08 NOTE — Progress Notes (Signed)
Phone [phone] In person visit   Subjective:   Katelyn Shelton is a 46 y.o. year old very pleasant female patient who presents for/with See problem oriented charting  This visit occurred during the SARS-CoV-2 public health emergency.  Safety protocols were in place, including screening questions prior to the visit, additional usage of staff PPE, and extensive cleaning of exam room while observing appropriate contact time as indicated for disinfecting solutions.   Past Medical History-  Patient Active Problem List   Diagnosis Date Noted   Malignant neoplasm of upper-outer quadrant of right breast in female, estrogen receptor positive ([place]) 02/11/2017    Priority: High   Hyperlipidemia, familial, high LDL 01/16/2010    Priority: High   Osteopenia of lumbar spine 05/28/2020    Priority: Medium    Aromatase inhibitor-associated arthralgia 02/24/2018    Priority: Medium    Enchondroma of bone 02/24/2018    Priority: Medium    Anxiety     Priority: Medium    Family history of diabetes mellitus 11/24/2021    Priority: Low   MRSA infection 01/28/2021    Priority: Low   Cellulitis of breast 12/06/2019    Priority: Low   History of total hysterectomy 06/29/2018    Priority: Low   Family history of colonic polyps 02/24/2018    Priority: Low   Genetic testing 02/21/2017    Priority: Low   Family history of stomach cancer     Priority: Low   Lesion of lung 04/05/2018    Medications- reviewed and updated Current Outpatient Medications  Medication Sig Dispense Refill   zolpidem (AMBIEN) 5 MG tablet Take 1 tablet (5 mg total) by mouth at bedtime as needed for sleep (do not drive for 8 hours after taking). 5 tablet 0   buPROPion (WELLBUTRIN) 75 MG tablet Take 0.5 tablets (37.5 mg total) by mouth 2 (two) times daily. (Patient not taking: Reported on 12/08/2021) 30 tablet 0   Calcium Carbonate (CALCIUM 600 PO) Take 1 capsule by mouth daily.     Evolocumab (REPATHA SURECLICK) 283 MG/ML  SOAJ Inject 140 mg (1 pen) into the skin every 14 (fourteen) days. 2 mL 11   exemestane (AROMASIN) 25 MG tablet Take 1 tablet (25 mg total) by mouth daily after breakfast. 90 tablet 3   ibandronate (BONIVA) 150 MG tablet Take in the morning every 28 days; take with a full glass of water, on an empty stomach, and do not take anything else by mouth or lie down for the next 60 min. 4 tablet 4   Ivermectin 1 % CREA Apply a small amount topically once daily. Apply to face 45 g 2   Multiple Vitamins-Minerals (MULTIVITAMIN WOMEN 50+) TABS Take 1 tablet by mouth daily.     ondansetron (ZOFRAN-ODT) 8 MG disintegrating tablet Take 1 tablet (8 mg total) by mouth every 8 (eight) hours as needed for nausea 10 tablet 0   Semaglutide-Weight Management (WEGOVY) 2.4 MG/0.75ML SOAJ Inject 2.4 mg into the skin once a week. 3 mL 2   venlafaxine (EFFEXOR) 37.5 MG tablet Take 1 tablet (37.5 mg total) by mouth 2 (two) times daily. (Patient not taking: Reported on 12/08/2021) 30 tablet 0   No current facility-administered medications for this visit.     Objective:  BP 96/60    Pulse (!) 103    Temp (!) 97.5 F (36.4 C)    Ht 5\' 1"  (1.549 m)    Wt 122 lb 12.8 oz (55.7 kg)  LMP 09/01/2017    SpO2 98%    BMI 23.20 kg/m  Anxious appearing    Assessment and Plan  # Anxiety/mild depressed mood/insomnia S:Medication: prior Effexor 37.5 mg BID (felt tired, less sex drive) and Wellbutrin- took herself off of both of these Venlafaxine extended release 37.5 mg with our help through messaging/phone calls.  -she feels more easily irritable and feels she overreactions. Wakes up at night anxious about things at night with some thoughts racing  Counseling: [name] as needed in past- now seeing Dr. Clovis Pu for 2 visits   Depression screen [facility] 2/9 12/08/2021 02/03/2021 01/28/2021  Decreased Interest 0 0 0  Down, Depressed, Hopeless 1 1 0  PHQ - 2 Score 1 1 0  Altered sleeping 3 - -  Tired, decreased energy 3 - -  Change in  appetite 0 - -  Feeling bad or failure about yourself  0 - -  Trouble concentrating 0 - -  Moving slowly or fidgety/restless 0 - -  Suicidal thoughts 0 - -  PHQ-9 Score 7 - -  Difficult doing work/chores Not difficult at all - -  Some recent data might be hidden   A/P: mid anxiety with GAD-7 at 10 (with ongoing issues I am going to change this to GAD) and depressed mood with PHQ-9 at 7 -This represents poor control of GAD and also suffering from poorly controlled insomnia -a lot of her issues resolved around sleep and then feeling tired the next day-has a lot of mind racing at night.. She feels like has insomnia as a big trigger for these issues often fueled by anxiety- has tolerated lorazepam in the past and we strongly considered this but wanted to hold off for now bc of needing to wait 8 hours to drive and her needs in caring for family in AM initially and feels like she would mainly take this if she woke up from sleep with anxiety.  We also discussed possible Ambien before bed and she prefers this option - After patient thought this over she opted to try Ambien just 2.5 mg to start but knows she cannot drive for 8 hours after taking-we discussed tolerance and dependence risk and for this reason discussed max 5 to 10/month-started with a single #5 prescription to trial of 5 mg -Semaglutide also related to anxiety 4% chance and it has decreased her ability to exercise per her report-mention stopping this but she is firmly against this-medication was prescribed by cardiology  Recommended follow up: Return in about 6 months (around 06/07/2022) for follow up- or sooner if needed. Future Appointments  Date Time Provider [place]  12/10/2021 11:00 AM Ivan Anchors [facility] None  12/22/2021  8:15 AM [facility] LAB [facility] PEC  03/10/2022  9:00 AM [facility] LAB [facility] None  03/10/2022  9:30 AM Nicholas Lose, MD [facility] None  06/09/2022 10:40 AM Yong Channel Brayton Mars, MD [facility]  PEC   Lab/Order associations:   ICD-10-CM   1. Anxiety  F41.9     2. Insomnia, unspecified type  G47.00      Meds ordered this encounter  Medications   zolpidem (AMBIEN) 5 MG tablet    Sig: Take 1 tablet (5 mg total) by mouth at bedtime as needed for sleep (do not drive for 8 hours after taking).    Dispense:  5 tablet    Refill:  0   Return precautions advised.  Garret Reddish, MD

## 2021-12-08 NOTE — Assessment & Plan Note (Signed)
#   Anxiety/mild depressed mood/insomnia S:Medication: prior Effexor 37.5 mg BID (felt tired, less sex drive) and Wellbutrin- took herself off of both of these Venlafaxine extended release 37.5 mg with our help through messaging/phone calls.  -she feels more easily irritable and feels she overreactions. Wakes up at night anxious about things at night with some thoughts racing  Counseling: [name] as needed in past- now seeing Dr. Clovis Pu for 2 visits  A/P: mid anxiety and depression. She feels like has insomnia as a big trigger for these issues often fueled by anxiety- has tolerated lorazepam in the past and we strongly considered this but wanted to hold off for now bc of needing to wait 8 hours to drive and her needs in caring for family in AM   - continue with therapy visits

## 2021-12-08 NOTE — Patient Instructions (Addendum)
Schedule a lab visit at the check out desk within 2 weeks. Return for future fasting labs meaning nothing but water after midnight please. Ok to take your medications with water.   Recommended follow up: Return in about 6 months (around 06/07/2022) for follow up- or sooner if needed.  We opted to try ambien since you feel sleep is your biggest barrier to feeling better during the day right now - try just half tablet. We only did 5 and the plan would be max 5-10 per month in future due to tolerance and dependence risk

## 2021-12-10 ENCOUNTER — Ambulatory Visit (INDEPENDENT_AMBULATORY_CARE_PROVIDER_SITE_OTHER): Payer: 59 | Admitting: Psychology

## 2021-12-10 DIAGNOSIS — F4323 Adjustment disorder with mixed anxiety and depressed mood: Secondary | ICD-10-CM

## 2021-12-10 NOTE — Progress Notes (Signed)
[facility] Counselor/Therapist Progress Note  Patient ID: Katelyn Shelton, MRN: [id number],    Date: 12/10/2021  Time Spent: 50 mins  Treatment Type: Individual Therapy  Reported Symptoms: Pt presents for follow up session, via webex video, due to virus outbreak.  Pt grants consent for the session, stating that she is in her home with no one else present.  I shared with pt that I am in my office at home with no one else present.  Mental Status Exam: Appearance:  Casual     Behavior: Appropriate  Motor: Normal  Speech/Language:  Clear and Coherent  Affect: Appropriate  Mood: normal  Thought process: normal  Thought content:   WNL  Sensory/Perceptual disturbances:   WNL  Orientation: oriented to person, place, and time/date  Attention: Good  Concentration: Good  Memory: WNL  Fund of knowledge:  Good  Insight:   Good  Judgment:  Good  Impulse Control: Good   Risk Assessment: Danger to Self:  No Self-injurious Behavior: No Danger to Others: No Duty to Warn:no Physical Aggression / Violence:No  Access to Firearms a concern: No  Gang Involvement:No   Subjective: Pt shares that she feels a little more together than she has been having.  [name] has gotten his public high school applications completed and submitted.  He toured [employer] and he is touring [employer] today.  He will visit the early college at [employer] with his dad as well.  Pt also shares that the construction project at their home has started and she will be happy when it is completed.  Pt shares that she and [name] are getting along better; "I feel like I have settled down and I think that is what has helped with me."  Pt shares she had an appt with a psychiatrist but she has not been to the appt and cannot find it on her schedule.  Pt shares she is not currently taking any medications for anxiety and or depression.  "I don't think I have ever had depression, even while having cancer.  The  anti-depressant I was taking seemed to help with her anxiety."  She shares that her sleep continues to be interrupted in the middle of the night.  Dr. Garret Reddish is pt's PCP.  Pt shares that she has been trying to spend more intentional time with [name] since our last session and will continue to look for those opportunities with him.  We will meet in 3 wks for a follow up session.  Interventions: Cognitive Behavioral Therapy  Diagnosis:Adjustment disorder with mixed anxiety and depressed mood  Plan: Treatment Plan Strengths/Abilities:  Intelligent, Intuitive, Willing to participate in therapy Treatment Preferences:  Outpatient Individual Therapy Statement of Needs:  Patient is to use CBT, mindfulness and coping skills to help manage and/or decrease symptoms associated with their diagnosis. Symptoms:  Depressed/Irritable mood, worry, social withdrawal Problems Addressed:  Depressive thoughts, Sadness, Sleep issues, etc. Long Term Goals:  Pt to reduce overall level, frequency, and intensity of the feelings of depression/anxiety as evidenced by decreased irritability, negative self talk, and helpless feelings from 6 to 7 days/week to 0 to 1 days/week, per client report, for at least 3 consecutive months.  Progress:  Short Term Goals:  Pt to verbally express understanding of the relationship between feelings of depression/anxiety and their impact on thinking patterns and behaviors.  Pt to verbalize an understanding of the role that distorted thinking plays in creating fears, excessive worry, and ruminations.  Progress:  Target Date:  11/27/2022 Frequency:  Bi-weekly Modality:  Cognitive Behavioral Therapy Interventions by Therapist:  Therapist will use CBT, Mindfulness exercises, Coping skills and Referrals, as needed by client. Client has verbally approved this treatment plan.  Ivan Anchors, [facility]

## 2021-12-14 ENCOUNTER — Other Ambulatory Visit (HOSPITAL_COMMUNITY): Payer: Self-pay

## 2021-12-21 DIAGNOSIS — C50411 Malignant neoplasm of upper-outer quadrant of right female breast: Secondary | ICD-10-CM | POA: Diagnosis not present

## 2021-12-21 DIAGNOSIS — Z170 Estrogen receptor positive status [ER+]: Secondary | ICD-10-CM | POA: Diagnosis not present

## 2021-12-21 DIAGNOSIS — Z9013 Acquired absence of bilateral breasts and nipples: Secondary | ICD-10-CM | POA: Diagnosis not present

## 2021-12-22 ENCOUNTER — Other Ambulatory Visit: Payer: 59

## 2021-12-28 ENCOUNTER — Other Ambulatory Visit: Payer: Self-pay

## 2021-12-28 ENCOUNTER — Other Ambulatory Visit (INDEPENDENT_AMBULATORY_CARE_PROVIDER_SITE_OTHER): Payer: 59

## 2021-12-28 DIAGNOSIS — E7849 Other hyperlipidemia: Secondary | ICD-10-CM | POA: Diagnosis not present

## 2021-12-28 DIAGNOSIS — Z0000 Encounter for general adult medical examination without abnormal findings: Secondary | ICD-10-CM | POA: Diagnosis not present

## 2021-12-28 LAB — LIPID PANEL
Cholesterol: 99 mg/dL (ref 0–200)
HDL: 45.2 mg/dL (ref >39.00)
LDL Cholesterol: 31 mg/dL (ref 0–99)
NonHDL: 53.67
Total CHOL/HDL Ratio: 2
Triglycerides: 112 mg/dL (ref 0.0–149.0)
VLDL: 22.4 mg/dL (ref 0.0–40.0)

## 2021-12-28 LAB — COMPREHENSIVE METABOLIC PANEL
ALT: 19 U/L (ref 0–35)
AST: 21 U/L (ref 0–37)
Albumin: 4.5 g/dL (ref 3.5–5.2)
Alkaline Phosphatase: 52 U/L (ref 39–117)
BUN: 14 mg/dL (ref 6–23)
CO2: 27 mEq/L (ref 19–32)
Calcium: 9.8 mg/dL (ref 8.4–10.5)
Chloride: 104 mEq/L (ref 96–112)
Creatinine, Ser: 0.65 mg/dL (ref 0.40–1.20)
GFR: 106.81 mL/min (ref >60.00)
Glucose, Bld: 78 mg/dL (ref 70–99)
Potassium: 4.2 mEq/L (ref 3.5–5.1)
Sodium: 141 mEq/L (ref 135–145)
Total Bilirubin: 0.5 mg/dL (ref 0.2–1.2)
Total Protein: 7.5 g/dL (ref 6.0–8.3)

## 2021-12-28 LAB — CBC WITH DIFFERENTIAL/PLATELET
Basophils Absolute: 0.1 10*3/uL (ref 0.0–0.1)
Basophils Relative: 1.1 % (ref 0.0–3.0)
Eosinophils Absolute: 0.1 10*3/uL (ref 0.0–0.7)
Eosinophils Relative: 1.3 % (ref 0.0–5.0)
HCT: 39.8 % (ref 36.0–46.0)
Hemoglobin: 13.8 g/dL (ref 12.0–15.0)
Lymphocytes Relative: 21.2 % (ref 12.0–46.0)
Lymphs Abs: 1.3 10*3/uL (ref 0.7–4.0)
MCHC: 34.6 g/dL (ref 30.0–36.0)
MCV: 94.2 fl (ref 78.0–100.0)
Monocytes Absolute: 0.5 10*3/uL (ref 0.1–1.0)
Monocytes Relative: 8.5 % (ref 3.0–12.0)
Neutro Abs: 4.3 10*3/uL (ref 1.4–7.7)
Neutrophils Relative %: 67.9 % (ref 43.0–77.0)
Platelets: 215 10*3/uL (ref 150.0–400.0)
RBC: 4.23 Mil/uL (ref 3.87–5.11)
RDW: 12.5 % (ref 11.5–15.5)
WBC: 6.4 10*3/uL (ref 4.0–10.5)

## 2021-12-29 ENCOUNTER — Other Ambulatory Visit (HOSPITAL_COMMUNITY): Payer: Self-pay

## 2021-12-31 ENCOUNTER — Ambulatory Visit (INDEPENDENT_AMBULATORY_CARE_PROVIDER_SITE_OTHER): Payer: 59 | Admitting: Psychology

## 2021-12-31 ENCOUNTER — Other Ambulatory Visit (HOSPITAL_COMMUNITY): Payer: Self-pay

## 2021-12-31 DIAGNOSIS — F4323 Adjustment disorder with mixed anxiety and depressed mood: Secondary | ICD-10-CM

## 2021-12-31 NOTE — Progress Notes (Signed)
[facility] Counselor/Therapist Progress Note ? ?Patient ID: REMY DIA, MRN: [id number],   ? ?Date: 12/31/2021 ? ?Time Spent: 50 mins ? ?Treatment Type: Individual Therapy ? ?Reported Symptoms: Pt presents for follow up session, via webex video, due to virus outbreak.  Pt grants consent for the session, stating that she is in her home with no one else present.  I shared with pt that I am in my office at home with no one else present. ? ?Mental Status Exam: ?Appearance:  Casual     ?Behavior: Appropriate  ?Motor: Normal  ?Speech/Language:  Clear and Coherent  ?Affect: Appropriate  ?Mood: normal  ?Thought process: normal  ?Thought content:   WNL  ?Sensory/Perceptual disturbances:   WNL  ?Orientation: oriented to person, place, and time/date  ?Attention: Good  ?Concentration: Good  ?Memory: WNL  ?Fund of knowledge:  Good  ?Insight:   Good  ?Judgment:  Good  ?Impulse Control: Good  ? ?Risk Assessment: ?Danger to Self:  No ?Self-injurious Behavior: No ?Danger to Others: No ?Duty to Warn:no ?Physical Aggression / Violence:No  ?Access to Firearms a concern: No  ?Gang Involvement:No  ? ?Subjective: Pt shares that the construction project at their home is going well; she hopes they will be finished in the next couple of weeks.  Pt shares she has been walking the dog very consistently and she has been practicing deep breathing as well.  Pt shares the kids Spring sports are starting back up and this creates responsibilities for pt.  Pt shares again that [name] has very high expectations for pt and [name] and himself.  [name] "can take it to extremes and he can be verbally tough on [place] when he thinks we are not measuring up.  He lost his temper at [place] earlier in the week and we were all in tears.  He was able to apologize.  [name] told me last night that he thinks I am working against him with the kids."  Pt seems to be very supportive of [place] because she identifies with him from a personality perspective.   Encouraged pt to continue with her self care activities and we will meet in 3 wks for a follow up session. ? ?Interventions: Cognitive Behavioral Therapy ? ?Diagnosis:Adjustment disorder with mixed anxiety and depressed mood ? ?Plan: Treatment Plan ?Strengths/Abilities:  Intelligent, Intuitive, Willing to participate in therapy ?Treatment Preferences:  Outpatient Individual Therapy ?Statement of Needs:  Patient is to use CBT, mindfulness and coping skills to help manage and/or decrease symptoms associated with their diagnosis. ?Symptoms:  Depressed/Irritable mood, worry, social withdrawal ?Problems Addressed:  Depressive thoughts, Sadness, Sleep issues, etc. ?Long Term Goals:  Pt to reduce overall level, frequency, and intensity of the feelings of depression/anxiety as evidenced by decreased irritability, negative self talk, and helpless feelings from 6 to 7 days/week to 0 to 1 days/week, per client report, for at least 3 consecutive months.  Progress:  ?Short Term Goals:  Pt to verbally express understanding of the relationship between feelings of depression/anxiety and their impact on thinking patterns and behaviors.  Pt to verbalize an understanding of the role that distorted thinking plays in creating fears, excessive worry, and ruminations.  Progress:  ?Target Date:  11/27/2022 ?Frequency:  Bi-weekly ?Modality:  Cognitive Behavioral Therapy ?Interventions by Therapist:  Therapist will use CBT, Mindfulness exercises, Coping skills and Referrals, as needed by client. ?Client has verbally approved this treatment plan. ? ?Ivan Anchors, [facility] ?

## 2022-01-06 ENCOUNTER — Other Ambulatory Visit (HOSPITAL_COMMUNITY): Payer: Self-pay

## 2022-01-07 ENCOUNTER — Other Ambulatory Visit (HOSPITAL_COMMUNITY): Payer: Self-pay

## 2022-01-13 ENCOUNTER — Other Ambulatory Visit (HOSPITAL_COMMUNITY): Payer: Self-pay

## 2022-01-21 ENCOUNTER — Ambulatory Visit (INDEPENDENT_AMBULATORY_CARE_PROVIDER_SITE_OTHER): Payer: 59 | Admitting: Psychology

## 2022-01-21 DIAGNOSIS — F4323 Adjustment disorder with mixed anxiety and depressed mood: Secondary | ICD-10-CM | POA: Diagnosis not present

## 2022-01-21 NOTE — Progress Notes (Signed)
Katelyn Shelton Progress Note ? ?Patient ID: Katelyn Shelton, MRN: [id number],   ? ?Date: 01/21/2022 ? ?Time Spent: 50 mins ? ?Treatment Type: Individual Therapy ? ?Reported Symptoms: Pt presents for follow up session, via webex video, due to virus outbreak.  Pt grants consent for the session, stating that she is in her home with no one else present.  I shared with pt that I am in my office at home with no one else present. ? ?Mental Status Exam: ?Appearance:  Casual     ?Behavior: Appropriate  ?Motor: Normal  ?Speech/Language:  Clear and Coherent  ?Affect: Appropriate  ?Mood: normal  ?Thought process: normal  ?Thought content:   WNL  ?Sensory/Perceptual disturbances:   WNL  ?Orientation: oriented to person, place, and time/date  ?Attention: Good  ?Concentration: Good  ?Memory: WNL  ?Fund of knowledge:  Good  ?Insight:   Good  ?Judgment:  Good  ?Impulse Control: Good  ? ?Risk Assessment: ?Danger to Self:  No ?Self-injurious Behavior: No ?Danger to Others: No ?Duty to Warn:no ?Physical Aggression / Violence:No  ?Access to Firearms a concern: No  ?Gang Involvement:No  ? ?Subjective: Pt shares that she has been painting some since our last session.  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  Pt shares that she felt a little harried in getting ready for the class and getting the kids home from school and fed before going to the church for the class.  The space they are renovating in their garage will be her art space.  She would like to also like to use it as a sewing room and would like to learn more about sewing.  Pt shares that she does not feel that she is very good about her creative endeavors but she enjoys the creative process.  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  There were 740 applications for 40 spots.  He will be going to [employer].   His 8th grade sermon is scheduled for 4/24 at the [facility].  Katelyn Shelton told her that she had made the process of writing his sermon a "terrible experience for him."  He has worked on it with a Clinical biochemist at school and he has finished it now.  Pt shares that she was putting a lot of anxiety into Katelyn Shelton getting the sermon done.  She knows her anxiety was not helpful for him.  Asked pt if anxiety helps or hurts her in her various efforts; she can identify benefits of anxiety in giving her energy to accomplish tasks.  She shares she does not believe it is good for her health or good for her marriage or her kids.  Asked pt to continue thinking about situations in which anxiety works for her and ones where it does not work for her.  Shared with her that the point of this exercise is to enable to her use anxiety for her benefit when it is helping her and to manage it better when it is not working for her.   ?Encouraged pt to continue with her self care activities and we will meet in 3 wks for a follow up session. ? ?Interventions: Cognitive Behavioral Therapy ? ?Diagnosis:Adjustment disorder with mixed anxiety and depressed mood ? ?Plan: Treatment Plan ?Strengths/Abilities:  Intelligent, Intuitive, Willing to participate in therapy ?Treatment Preferences:  Outpatient  Individual Therapy ?Statement of Needs:  Patient is to use CBT, mindfulness and coping skills to help manage and/or decrease symptoms associated with their diagnosis. ?Symptoms:  Depressed/Irritable mood, worry, social withdrawal ?Problems Addressed:  Depressive thoughts, Sadness, Sleep issues, etc. ?Long Term Goals:  Pt to reduce overall level, frequency, and intensity of the feelings of depression/anxiety as evidenced by decreased irritability, negative self talk, and helpless feelings from 6 to 7 days/week to 0 to 1 days/week, per client report, for at least 3 consecutive months.  Progress:  ?Short Term Goals:  Pt to verbally express understanding of  the relationship between feelings of depression/anxiety and their impact on thinking patterns and behaviors.  Pt to verbalize an understanding of the role that distorted thinking plays in creating fears, excessive worry, and ruminations.  Progress:  ?Target Date:  11/27/2022 ?Frequency:  Bi-weekly ?Modality:  Cognitive Behavioral Therapy ?Interventions by Therapist:  Therapist will use CBT, Mindfulness exercises, Coping skills and Referrals, as needed by client. ?Client has verbally approved this treatment plan. ? ?Katelyn Shelton, [facility] ?

## 2022-02-09 ENCOUNTER — Other Ambulatory Visit (HOSPITAL_COMMUNITY): Payer: Self-pay

## 2022-02-11 ENCOUNTER — Ambulatory Visit (INDEPENDENT_AMBULATORY_CARE_PROVIDER_SITE_OTHER): Payer: 59 | Admitting: Psychology

## 2022-02-11 DIAGNOSIS — F4323 Adjustment disorder with mixed anxiety and depressed mood: Secondary | ICD-10-CM

## 2022-02-11 NOTE — Progress Notes (Signed)
[facility] Counselor/Therapist Progress Note ? ?Patient ID: Katelyn Shelton, MRN: [id number],   ? ?Date: 02/11/2022 ? ?Time Spent: 45 mins ? ?Treatment Type: Individual Therapy ? ?Reported Symptoms: Pt presents for follow up session, via webex video, due to virus outbreak.  Pt grants consent for the session, stating that she is in her home with no one else present.  I shared with pt that I am in my office at home with no one else present. ? ?Mental Status Exam: ?Appearance:  Casual     ?Behavior: Appropriate  ?Motor: Normal  ?Speech/Language:  Clear and Coherent  ?Affect: Appropriate  ?Mood: normal  ?Thought process: normal  ?Thought content:   WNL  ?Sensory/Perceptual disturbances:   WNL  ?Orientation: oriented to person, place, and time/date  ?Attention: Good  ?Concentration: Good  ?Memory: WNL  ?Fund of knowledge:  Good  ?Insight:   Good  ?Judgment:  Good  ?Impulse Control: Good  ? ?Risk Assessment: ?Danger to Self:  No ?Self-injurious Behavior: No ?Danger to Others: No ?Duty to Warn:no ?Physical Aggression / Violence:No  ?Access to Firearms a concern: No  ?Gang Involvement:No  ? ?Subjective: Pt shares that the work has been completed on their pool house.  Pt shares that [name]'s parents are coming this week; they are celebrating their 50th anniversary and his dad's 45th birthday.  Pt shares that their family went to [place] for Spring Break.  Pt shares that she enjoyed a walk they took along a volcano.  [name] got sick while they were there and she had to stay with him for 2 extra days in [place] while [place] recovered.  They are going to [place] in May and [place] in June.  Pt is feeling responsible for her in-laws anniversary part on Sat night.  "They have always been good to me and I am happy to do this party for them."  Pt notes that [name]'s father is 45 yo and can be demanding in his requests; there are generational issues in dealing with him.  They are planning to stay for [name]'s sermon on  Monday.  Pt shares that [name] likes his job and he is also able to take a fair amount of time off.  Asked pt to continue thinking about situations in which anxiety works for her and ones where it does not work for her.  Shared with her that the point of this exercise is to enable to her use anxiety for her benefit when it is helping her and to manage it better when it is not working for her.  Encouraged pt to continue with her self care activities and we will meet in 3 wks for a follow up session. ? ?Interventions: Cognitive Behavioral Therapy ? ?Diagnosis:Adjustment disorder with mixed anxiety and depressed mood ? ?Plan: Treatment Plan ?Strengths/Abilities:  Intelligent, Intuitive, Willing to participate in therapy ?Treatment Preferences:  Outpatient Individual Therapy ?Statement of Needs:  Patient is to use CBT, mindfulness and coping skills to help manage and/or decrease symptoms associated with their diagnosis. ?Symptoms:  Depressed/Irritable mood, worry, social withdrawal ?Problems Addressed:  Depressive thoughts, Sadness, Sleep issues, etc. ?Long Term Goals:  Pt to reduce overall level, frequency, and intensity of the feelings of depression/anxiety as evidenced by decreased irritability, negative self talk, and helpless feelings from 6 to 7 days/week to 0 to 1 days/week, per client report, for at least 3 consecutive months.  Progress:  ?Short Term Goals:  Pt to verbally express understanding of the relationship between feelings  of depression/anxiety and their impact on thinking patterns and behaviors.  Pt to verbalize an understanding of the role that distorted thinking plays in creating fears, excessive worry, and ruminations.  Progress:  ?Target Date:  11/27/2022 ?Frequency:  Bi-weekly ?Modality:  Cognitive Behavioral Therapy ?Interventions by Therapist:  Therapist will use CBT, Mindfulness exercises, Coping skills and Referrals, as needed by client. ?Client has verbally approved this treatment plan. ? ?Ivan Anchors, [facility] ?

## 2022-02-18 ENCOUNTER — Other Ambulatory Visit (HOSPITAL_COMMUNITY): Payer: Self-pay

## 2022-02-18 ENCOUNTER — Encounter: Payer: Self-pay | Admitting: Oncology

## 2022-02-24 NOTE — Progress Notes (Signed)
? ?Patient Care Team: ?Marin Olp, MD as PCP - General (Family Medicine) ?Freada Bergeron, MD as PCP - Cardiology (Cardiology) ?Magrinat, Virgie Dad, MD (Inactive) as Consulting Physician (Oncology) ?Rolm Bookbinder, MD as Consulting Physician (General Surgery) ?Louretta Shorten, MD as Consulting Physician (Obstetrics and Gynecology) ?Verlan Friends, MD as Referring Physician (Hematology and Oncology) ?Kyung Rudd, MD as Consulting Physician (Radiation Oncology) ?Irene Limbo, MD as Consulting Physician (Plastic Surgery) ?Muss, Demaris Callander as Consulting Physician (Internal Medicine) ?Kathrynn Ducking, MD (Inactive) as Consulting Physician (Neurology) ?Dorothy Spark, MD as Consulting Physician (Cardiology) ? ?DIAGNOSIS:  ?Encounter Diagnosis  ?Name Primary?  ? Malignant neoplasm of upper-outer quadrant of right breast in female, estrogen receptor positive ([place])   ? ? ?SUMMARY OF ONCOLOGIC HISTORY: ?Oncology History  ?Malignant neoplasm of upper-outer quadrant of right breast in female, estrogen receptor positive ([place])  ?02/11/2017 Initial Diagnosis  ? Malignant neoplasm of upper-outer quadrant of right breast in female, estrogen receptor positive ([place]) ? ?  ?02/21/2017 Genetic Testing  ? Negative genetic testing on the STAT panel through [facility].  The STAT gene panel offered by [facility] and includes sequencing and rearrangement analysis for the following 9 genes: ATM, BRCA1, BRCA2, CDH1, CHEK2, PALB2, PTEN, STK11, and TP53.  The report date is February 19, 2017. ? ?We re-requisitioned for a larger Multi gene panel, which was also negative.  The Multi-Gene Panel offered by [facility] includes sequencing and/or deletion duplication testing of the following 80 genes: ALK, APC, ATM, AXIN2,BAP1,  BARD1, BLM, BMPR1A, BRCA1, BRCA2, BRIP1, CASR, CDC73, CDH1, CDK4, CDKN1B, CDKN1C, CDKN2A (p14ARF), CDKN2A (p16INK4a), CEBPA, CHEK2, CTNNA1, DICER1, DIS3L2, EGFR (c.2369C>T, p.Thr790Met variant only),  EPCAM (Deletion/duplication testing only), FH, FLCN, GATA2, GPC3, GREM1 (Promoter region deletion/duplication testing only), HOXB13 (c.251G>A, p.Gly84Glu), HRAS, KIT, MAX, MEN1, MET, MITF (c.952G>A, p.Glu318Lys variant only), MLH1, MSH2, MSH3, MSH6, MUTYH, NBN, NF1, NF2, NTHL1, PALB2, PDGFRA, PHOX2B, PMS2, POLD1, POLE, POT1, PRKAR1A, PTCH1, PTEN, RAD50, RAD51C, RAD51D, RB1, RECQL4, RET, RUNX1, SDHAF2, SDHA (sequence changes only), SDHB, SDHC, SDHD, SMAD4, SMARCA4, SMARCB1, SMARCE1, STK11, SUFU, TERT, TERT, TMEM127, TP53, TSC1, TSC2, VHL, WRN and WT1.  The report date is February 21, 2017. ? ?  ? ? ?CHIEF COMPLIANT: Follow-up on Anastrozole ? ?INTERVAL HISTORY: Katelyn Shelton is a 46 y.o. with the above mention Estrogen receptor positive breast cancer (s/p bilateral mastectomies) She presents to the clinic today for a follow-up. She complains of joint pains stiffness and vaginal dryness.  She has also noticed significant weight gain for which she went on a Ozempic and since then she has been losing the weight significantly.  She was on Wellbutrin and they were trying to taper her off but she thinks that the Wellbutrin is helping and she wants to stay on it.  She is extremely anxious and nervous about coming off the antiestrogen therapy even though it is causing her significant side effects. ? ? ?ALLERGIES:  is allergic to fentanyl, peach flavor, penicillins, sulfa antibiotics, sulfasalazine, and latex. ? ?MEDICATIONS:  ?Current Outpatient Medications  ?Medication Sig Dispense Refill  ? buPROPion (WELLBUTRIN XL) 150 MG 24 hr tablet Take 1 tablet (150 mg total) by mouth daily. 90 tablet 3  ? anastrozole (ARIMIDEX) 1 MG tablet Take 1 tablet (1 mg total) by mouth daily. 90 tablet 3  ? Calcium Carbonate (CALCIUM 600 PO) Take 1 capsule by mouth daily.    ? Evolocumab (REPATHA SURECLICK) 300 MG/ML SOAJ Inject 140 mg (1 pen) into the skin every 14 (  fourteen) days. 2 mL 11  ? exemestane (AROMASIN) 25 MG tablet Take 1 tablet (25  mg total) by mouth daily after breakfast. 90 tablet 3  ? ibandronate (BONIVA) 150 MG tablet Take in the morning every 28 days; take with a full glass of water, on an empty stomach, and do not take anything else by mouth or lie down for the next 60 min. 4 tablet 4  ? Ivermectin 1 % CREA Apply a small amount topically once daily. Apply to face 45 g 2  ? Multiple Vitamins-Minerals (MULTIVITAMIN ADULT EXTRA C PO) Take 1 capsule by mouth daily.    ? Multiple Vitamins-Minerals (MULTIVITAMIN WOMEN 50+) TABS Take 1 tablet by mouth daily.    ? ondansetron (ZOFRAN-ODT) 8 MG disintegrating tablet Take 1 tablet (8 mg total) by mouth every 8 (eight) hours as needed for nausea 10 tablet 0  ? Semaglutide-Weight Management (WEGOVY) 2.4 MG/0.75ML SOAJ Inject 2.4 mg into the skin once a week. 3 mL 2  ? Zinc 50 MG TABS Take by mouth.    ? zolpidem (AMBIEN) 5 MG tablet Take 1 tablet (5 mg total) by mouth at bedtime as needed for sleep (do not drive for 8 hours after taking). 5 tablet 0  ? ?No current facility-administered medications for this visit.  ? ? ?PHYSICAL EXAMINATION: ?ECOG PERFORMANCE STATUS: 1 - Symptomatic but completely ambulatory ? ?Vitals:  ? 03/10/22 0919  ?BP: 117/73  ?Pulse: (!) 58  ?Resp: 18  ?Temp: 97.9 ?F (36.6 ?C)  ?SpO2: 100%  ? ?Filed Weights  ? 03/10/22 0919  ?Weight: 122 lb 4.8 oz (55.5 kg)  ?  ? ?LABORATORY DATA:  ?I have reviewed the data as listed ? ?  Latest Ref Rng & Units 03/10/2022  ?  9:00 AM 12/28/2021  ? 10:37 AM 09/16/2021  ? 10:49 AM  ?CMP  ?Glucose 70 - 99 mg/dL 89   78   95    ?BUN 6 - 20 mg/dL '14   14   21    ' ?Creatinine 0.44 - 1.00 mg/dL 0.75   0.65   0.83    ?Sodium 135 - 145 mmol/L 141   141   141    ?Potassium 3.5 - 5.1 mmol/L 4.2   4.2   4.1    ?Chloride 98 - 111 mmol/L 105   104   104    ?CO2 22 - 32 mmol/L '30   27   28    ' ?Calcium 8.9 - 10.3 mg/dL 9.6   9.8   9.5    ?Total Protein 6.5 - 8.1 g/dL 7.5   7.5   7.5    ? 7.5    ?Total Bilirubin 0.3 - 1.2 mg/dL 0.6   0.5   0.6    ? 0.6     ?Alkaline Phos 38 - 126 U/L 60   52   74    ? 74    ?AST 15 - 41 U/L '20   21   18    ' ? 18    ?ALT 0 - 44 U/L '20   19   16    ' ? 16    ? ? ?Lab Results  ?Component Value Date  ? WBC 4.8 03/10/2022  ? HGB 14.6 03/10/2022  ? HCT 42.5 03/10/2022  ? MCV 93.4 03/10/2022  ? PLT 240 03/10/2022  ? NEUTROABS 2.5 03/10/2022  ? ? ?ASSESSMENT & PLAN:  ?Malignant neoplasm of upper-outer quadrant of right breast in female, estrogen receptor positive (  [place]) ?02/08/17: T2 N0, stage IB IDC, grade 2, ER/PR positive, HER-2 nonamplified, with a Ki67-1 of 5% Oncotype 11 (9% ROR) ?02/28/2017: Bilateral mastectomies: Left: Intraductal papilloma, right: T2N1 stage IIa grade 1 IDC (implants got infected and removed), MammaPrint: Low risk ?02/11/2017: Tamoxifen started neoadjuvantly and held during chemo ?02/19/2017: Genetics: Negative ?04/15/2017-08/03/2017: Adjuvant chemo with Taxotere and Cytoxan every 3 weeks x4 at [facility] ?09/30/2017: Completed radiation ?10/25/2017: Anastrozole with goserelin until August 2019, underwent hysterectomy with BSO 06/29/2018 ? ?Anastrozole toxicities: ?Severe vaginal dryness: She had done [facility] touch and tells me it has not helped her. ?Weight gain secondary to anastrozole: She is on Ozempic and has lost all the weight. ?Decreased sexual desire ?Musculoskeletal aches and pains: I encouraged her to use CBD oil along with turmeric and tonic water. ? ?Breast cancer surveillance: ?no role of imaging studies since she had bilateral mastectomies ? ?I discussed with her that we can perform breast cancer index to determine if she would benefit from extended adjuvant therapy but she does not want any testing done and she wants to stay on this medicine as long as possible at least up to 7 years and then make a decision. ? ? ?Return to clinic in 1 year for follow-up ? ? ? ?Orders Placed This Encounter  ?Procedures  ? DG Bone Density  ?  Standing Status:   Future  ?  Standing Expiration Date:   03/10/2023  ?  Scheduling  Instructions:  ?   Please call her with appt  ?  Order Specific Question:   Reason for Exam (SYMPTOM  OR DIAGNOSIS REQUIRED)  ?  Answer:   osteopenia on Anti estrogen therapy  ?  Order Specific Question:   Is the

## 2022-03-04 ENCOUNTER — Ambulatory Visit (INDEPENDENT_AMBULATORY_CARE_PROVIDER_SITE_OTHER): Payer: 59 | Admitting: Psychology

## 2022-03-04 DIAGNOSIS — F4323 Adjustment disorder with mixed anxiety and depressed mood: Secondary | ICD-10-CM | POA: Diagnosis not present
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
## 2022-03-05 ENCOUNTER — Telehealth: Payer: Self-pay

## 2022-03-05 NOTE — Telephone Encounter (Signed)
At time of last visit it was noted that she was not taking bupropion so I am not clear what she means by lower dose-she had been given medication to be able to wean off-probably best to schedule a visit in coming weeks ?

## 2022-03-05 NOTE — Telephone Encounter (Signed)
Patient is requesting a lower dose of bupropion.    Patient was last seen 12/08/2021.    [facility] uses [facility].  Please advise if appt is needed.

## 2022-03-05 NOTE — Telephone Encounter (Signed)
See below

## 2022-03-08 NOTE — Telephone Encounter (Signed)
Pt is scheduled on 5/18 at 11:20am ?

## 2022-03-08 NOTE — Telephone Encounter (Signed)
Please schedule pt ov to f/u on this med request. ?

## 2022-03-08 NOTE — Telephone Encounter (Signed)
Vml for pt to call back and sch fu with dr hunter  ?

## 2022-03-09 ENCOUNTER — Encounter: Payer: Self-pay | Admitting: Oncology

## 2022-03-09 ENCOUNTER — Other Ambulatory Visit (HOSPITAL_BASED_OUTPATIENT_CLINIC_OR_DEPARTMENT_OTHER): Payer: Self-pay | Admitting: Family

## 2022-03-09 ENCOUNTER — Other Ambulatory Visit: Payer: Self-pay | Admitting: *Deleted

## 2022-03-09 ENCOUNTER — Other Ambulatory Visit: Payer: Self-pay

## 2022-03-09 ENCOUNTER — Other Ambulatory Visit: Payer: Self-pay | Admitting: Pharmacist

## 2022-03-09 ENCOUNTER — Other Ambulatory Visit (HOSPITAL_COMMUNITY): Payer: Self-pay

## 2022-03-09 DIAGNOSIS — C50411 Malignant neoplasm of upper-outer quadrant of right female breast: Secondary | ICD-10-CM

## 2022-03-09 MED ORDER — REPATHA SURECLICK 140 MG/ML ~~LOC~~ SOAJ
140.0000 mg | SUBCUTANEOUS | 11 refills | Status: DC
Start: 2022-03-09 — End: 2023-02-14
  Filled 2022-03-09: qty 2, 28d supply, fill #0
  Filled 2022-04-02: qty 2, 28d supply, fill #1
  Filled 2022-05-14: qty 2, 28d supply, fill #2
  Filled 2022-06-08: qty 2, 28d supply, fill #3
  Filled 2022-07-06: qty 2, 28d supply, fill #4
  Filled 2022-07-29: qty 2, 28d supply, fill #5
  Filled 2022-08-26: qty 2, 28d supply, fill #6
  Filled 2022-09-22: qty 2, 28d supply, fill #7
  Filled 2022-10-26: qty 2, 28d supply, fill #8
  Filled 2022-11-22: qty 2, 28d supply, fill #9
  Filled 2022-12-20: qty 2, 28d supply, fill #10
  Filled 2023-01-25: qty 2, 28d supply, fill #11

## 2022-03-09 MED ORDER — WEGOVY 2.4 MG/0.75ML ~~LOC~~ SOAJ
2.4000 mg | SUBCUTANEOUS | 2 refills | Status: DC
  Filled 2022-03-09: qty 3, 28d supply, fill #0
  Filled 2022-03-29: qty 3, 28d supply, fill #1
  Filled 2022-05-14: qty 3, 28d supply, fill #2

## 2022-03-09 NOTE — Telephone Encounter (Signed)
Rx(s) sent to pharmacy electronically.  

## 2022-03-10 ENCOUNTER — Inpatient Hospital Stay: Payer: 59 | Attending: Hematology and Oncology | Admitting: Hematology and Oncology

## 2022-03-10 ENCOUNTER — Other Ambulatory Visit (HOSPITAL_COMMUNITY): Payer: Self-pay

## 2022-03-10 ENCOUNTER — Inpatient Hospital Stay: Payer: 59

## 2022-03-10 ENCOUNTER — Other Ambulatory Visit: Payer: Self-pay

## 2022-03-10 DIAGNOSIS — M2550 Pain in unspecified joint: Secondary | ICD-10-CM | POA: Diagnosis not present

## 2022-03-10 DIAGNOSIS — R6882 Decreased libido: Secondary | ICD-10-CM | POA: Insufficient documentation

## 2022-03-10 DIAGNOSIS — Z170 Estrogen receptor positive status [ER+]: Secondary | ICD-10-CM | POA: Diagnosis not present

## 2022-03-10 DIAGNOSIS — C50411 Malignant neoplasm of upper-outer quadrant of right female breast: Secondary | ICD-10-CM | POA: Diagnosis not present

## 2022-03-10 DIAGNOSIS — Z853 Personal history of malignant neoplasm of breast: Secondary | ICD-10-CM | POA: Insufficient documentation

## 2022-03-10 LAB — CBC WITH DIFFERENTIAL (CANCER CENTER ONLY)
Abs Immature Granulocytes: 0.01 10*3/uL (ref 0.00–0.07)
Basophils Absolute: 0.1 10*3/uL (ref 0.0–0.1)
Basophils Relative: 1 %
Eosinophils Absolute: 0.2 10*3/uL (ref 0.0–0.5)
Eosinophils Relative: 3 %
HCT: 42.5 % (ref 36.0–46.0)
Hemoglobin: 14.6 g/dL (ref 12.0–15.0)
Immature Granulocytes: 0 %
Lymphocytes Relative: 34 %
Lymphs Abs: 1.7 10*3/uL (ref 0.7–4.0)
MCH: 32.1 pg (ref 26.0–34.0)
MCHC: 34.4 g/dL (ref 30.0–36.0)
MCV: 93.4 fL (ref 80.0–100.0)
Monocytes Absolute: 0.5 10*3/uL (ref 0.1–1.0)
Monocytes Relative: 10 %
Neutro Abs: 2.5 10*3/uL (ref 1.7–7.7)
Neutrophils Relative %: 52 %
Platelet Count: 240 10*3/uL (ref 150–400)
RBC: 4.55 MIL/uL (ref 3.87–5.11)
RDW: 12 % (ref 11.5–15.5)
WBC Count: 4.8 10*3/uL (ref 4.0–10.5)
nRBC: 0 % (ref 0.0–0.2)

## 2022-03-10 LAB — CMP (CANCER CENTER ONLY)
ALT: 20 U/L (ref 0–44)
AST: 20 U/L (ref 15–41)
Albumin: 4.5 g/dL (ref 3.5–5.0)
Alkaline Phosphatase: 60 U/L (ref 38–126)
Anion gap: 6 (ref 5–15)
BUN: 14 mg/dL (ref 6–20)
CO2: 30 mmol/L (ref 22–32)
Calcium: 9.6 mg/dL (ref 8.9–10.3)
Chloride: 105 mmol/L (ref 98–111)
Creatinine: 0.75 mg/dL (ref 0.44–1.00)
GFR, Estimated: >60 mL/min (ref >60)
Glucose, Bld: 89 mg/dL (ref 70–99)
Potassium: 4.2 mmol/L (ref 3.5–5.1)
Sodium: 141 mmol/L (ref 135–145)
Total Bilirubin: 0.6 mg/dL (ref 0.3–1.2)
Total Protein: 7.5 g/dL (ref 6.5–8.1)

## 2022-03-10 MED ORDER — ANASTROZOLE 1 MG PO TABS
1.0000 mg | ORAL_TABLET | Freq: Every day | ORAL | 3 refills | Status: DC
  Filled 2022-03-10: qty 90, 90d supply, fill #0

## 2022-03-10 MED ORDER — BUPROPION HCL ER (XL) 150 MG PO TB24
150.0000 mg | ORAL_TABLET | Freq: Every day | ORAL | 3 refills | Status: DC
  Filled 2022-03-10: qty 90, 90d supply, fill #0
  Filled 2022-05-28 – 2022-06-03 (×2): qty 90, 90d supply, fill #1
  Filled 2022-09-02: qty 90, 90d supply, fill #2
  Filled 2022-11-22: qty 90, 90d supply, fill #3

## 2022-03-10 NOTE — Assessment & Plan Note (Signed)
02/08/17: T2 N0, stage IB IDC, grade 2, ER/PR positive, HER-2 nonamplified, with a Ki67-1 of 5% Oncotype 11 (9% ROR) ?02/28/2017: Bilateral mastectomies: Left: Intraductal papilloma, right: T2N1 stage IIa grade 1 IDC (implants got infected and removed), MammaPrint: Low risk ?02/11/2017: Tamoxifen started neoadjuvantly and held during chemo ?02/19/2017: Genetics: Negative ?04/15/2017-08/03/2017: Adjuvant chemo with Taxotere and Cytoxan every 3 weeks x4 at [facility] ?09/30/2017: Completed radiation ?10/25/2017: Anastrozole with goserelin until August 2019, underwent hysterectomy with BSO 06/29/2018 ? ?Anastrozole toxicities: ? ?Breast cancer surveillance: ?1.  Breast exam 03/10/2022: Benign ?2. no role of imaging studies since she had bilateral mastectomies ? ?Return to clinic in 1 year for follow-up ? ?

## 2022-03-11 ENCOUNTER — Other Ambulatory Visit (HOSPITAL_COMMUNITY): Payer: Self-pay

## 2022-03-11 ENCOUNTER — Ambulatory Visit: Payer: 59 | Admitting: Family Medicine

## 2022-03-18 ENCOUNTER — Encounter: Payer: Self-pay | Admitting: Infectious Diseases

## 2022-03-24 ENCOUNTER — Other Ambulatory Visit (HOSPITAL_COMMUNITY): Payer: Self-pay

## 2022-03-24 DIAGNOSIS — L700 Acne vulgaris: Secondary | ICD-10-CM | POA: Diagnosis not present

## 2022-03-24 MED ORDER — ISOTRETINOIN 20 MG PO CAPS
20.0000 mg | ORAL_CAPSULE | Freq: Every day | ORAL | 0 refills | Status: DC
  Filled 2022-03-24: qty 30, 30d supply, fill #0

## 2022-03-25 ENCOUNTER — Other Ambulatory Visit (HOSPITAL_COMMUNITY): Payer: Self-pay

## 2022-03-29 ENCOUNTER — Other Ambulatory Visit (HOSPITAL_COMMUNITY): Payer: Self-pay

## 2022-03-31 ENCOUNTER — Other Ambulatory Visit (HOSPITAL_COMMUNITY): Payer: Self-pay

## 2022-04-01 ENCOUNTER — Ambulatory Visit (INDEPENDENT_AMBULATORY_CARE_PROVIDER_SITE_OTHER): Payer: 59 | Admitting: Psychology

## 2022-04-01 DIAGNOSIS — F4323 Adjustment disorder with mixed anxiety and depressed mood: Secondary | ICD-10-CM | POA: Diagnosis not present

## 2022-04-01 NOTE — Progress Notes (Signed)
[place] Counselor/Therapist Progress Note  Patient ID: Katelyn Shelton, MRN: [id number],    Date: 04/01/2022  Time Spent: 45 mins  Treatment Type: Individual Therapy  Reported Symptoms: Pt presents for follow up session, via webex video.  Pt grants consent for the session, stating that she is in her home with no one else present.  I shared with pt that I am in my office with no one else present.  Mental Status Exam: Appearance:  Casual     Behavior: Appropriate  Motor: Normal  Speech/Language:  Clear and Coherent  Affect: Appropriate  Mood: normal  Thought process: normal  Thought content:   WNL  Sensory/Perceptual disturbances:   WNL  Orientation: oriented to person, place, and time/date  Attention: Good  Concentration: Good  Memory: WNL  Fund of knowledge:  Good  Insight:   Good  Judgment:  Good  Impulse Control: Good   Risk Assessment: Danger to Self:  No Self-injurious Behavior: No Danger to Others: No Duty to Warn:no Physical Aggression / Violence:No  Access to Firearms a concern: No  Gang Involvement:No   Subjective: Pt shares that "I have been pretty good since our last session.  We took a family trip to [place] and that was a lot of fun for the family.  [place] graduated from [employer].  [name] is taking a lot of time off this summer too.  The kids are going to camps during a lot of July and I will be staying in [place] for three days by myself and I am looking forward to that."  Pt shares she is planning to paint during her time in [place].  Pt also mentions that, with everything they have going on this summer, it is likely best for [place] not to meet during the summer.  Pt disconnected from the session at some point, without notification.  She did mention at the beginning that she was having problems with her technology today so that was likely the issue for today.  I waited a few more minutes for her to come back, but she did not re-establish a  connection.  I presume that she will call the office to schedule a follow up session at her convenience later.  At our next session, I need to follow up with pt about her parents and her sister (Parkinsons Disease).     Interventions: Cognitive Behavioral Therapy  Diagnosis:Adjustment disorder with mixed anxiety and depressed mood  Plan: Treatment Plan Strengths/Abilities:  Intelligent, Intuitive, Willing to participate in therapy Treatment Preferences:  Outpatient Individual Therapy Statement of Needs:  Patient is to use CBT, mindfulness and coping skills to help manage and/or decrease symptoms associated with their diagnosis. Symptoms:  Depressed/Irritable mood, worry, social withdrawal Problems Addressed:  Depressive thoughts, Sadness, Sleep issues, etc. Long Term Goals:  Pt to reduce overall level, frequency, and intensity of the feelings of depression/anxiety as evidenced by decreased irritability, negative self talk, and helpless feelings from 6 to 7 days/week to 0 to 1 days/week, per client report, for at least 3 consecutive months.  Progress:  Short Term Goals:  Pt to verbally express understanding of the relationship between feelings of depression/anxiety and their impact on thinking patterns and behaviors.  Pt to verbalize an understanding of the role that distorted thinking plays in creating fears, excessive worry, and ruminations.  Progress:  Target Date:  11/27/2022 Frequency:  Bi-weekly Modality:  Cognitive Behavioral Therapy Interventions by Therapist:  Therapist will use CBT, Mindfulness exercises, Coping skills and  Referrals, as needed by client. Client has verbally approved this treatment plan.  Ivan Anchors, [facility]

## 2022-04-02 ENCOUNTER — Other Ambulatory Visit (HOSPITAL_COMMUNITY): Payer: Self-pay

## 2022-04-14 ENCOUNTER — Other Ambulatory Visit (HOSPITAL_COMMUNITY): Payer: Self-pay

## 2022-04-28 DIAGNOSIS — Z79899 Other long term (current) drug therapy: Secondary | ICD-10-CM | POA: Diagnosis not present

## 2022-04-28 DIAGNOSIS — L700 Acne vulgaris: Secondary | ICD-10-CM | POA: Diagnosis not present

## 2022-04-29 ENCOUNTER — Other Ambulatory Visit (HOSPITAL_COMMUNITY): Payer: Self-pay

## 2022-04-29 MED ORDER — ISOTRETINOIN 40 MG PO CAPS
40.0000 mg | ORAL_CAPSULE | Freq: Every day | ORAL | 0 refills | Status: DC
  Filled 2022-04-29 – 2022-04-30 (×2): qty 30, 30d supply, fill #0

## 2022-04-30 ENCOUNTER — Other Ambulatory Visit (HOSPITAL_COMMUNITY): Payer: Self-pay

## 2022-05-14 ENCOUNTER — Other Ambulatory Visit (HOSPITAL_COMMUNITY): Payer: Self-pay

## 2022-05-28 ENCOUNTER — Other Ambulatory Visit (HOSPITAL_COMMUNITY): Payer: Self-pay

## 2022-05-31 ENCOUNTER — Other Ambulatory Visit (HOSPITAL_COMMUNITY): Payer: Self-pay

## 2022-05-31 DIAGNOSIS — L700 Acne vulgaris: Secondary | ICD-10-CM | POA: Diagnosis not present

## 2022-05-31 DIAGNOSIS — Z79899 Other long term (current) drug therapy: Secondary | ICD-10-CM | POA: Diagnosis not present

## 2022-06-01 ENCOUNTER — Other Ambulatory Visit (HOSPITAL_COMMUNITY): Payer: Self-pay

## 2022-06-01 DIAGNOSIS — L700 Acne vulgaris: Secondary | ICD-10-CM | POA: Diagnosis not present

## 2022-06-01 DIAGNOSIS — Z79899 Other long term (current) drug therapy: Secondary | ICD-10-CM | POA: Diagnosis not present

## 2022-06-02 ENCOUNTER — Other Ambulatory Visit (HOSPITAL_COMMUNITY): Payer: Self-pay

## 2022-06-02 ENCOUNTER — Telehealth: Payer: Self-pay | Admitting: Cardiology

## 2022-06-02 DIAGNOSIS — E663 Overweight: Secondary | ICD-10-CM

## 2022-06-02 DIAGNOSIS — I739 Peripheral vascular disease, unspecified: Secondary | ICD-10-CM

## 2022-06-02 DIAGNOSIS — E7849 Other hyperlipidemia: Secondary | ICD-10-CM

## 2022-06-02 MED ORDER — ISOTRETINOIN 40 MG PO CAPS
40.0000 mg | ORAL_CAPSULE | Freq: Every day | ORAL | 0 refills | Status: DC
  Filled 2022-06-02: qty 30, 30d supply, fill #0

## 2022-06-02 NOTE — Telephone Encounter (Signed)
New Message:     Patient is scheduled to see Katelyn Shelton on 06-11-22. She would like for Dr Johney Frame to order her a Lipid Profile please. She wants her Triglyceride checked. She would like to have the results by the time she see ichele next week.Please let her jknow when this is done, so she can make an appointmet.

## 2022-06-03 ENCOUNTER — Other Ambulatory Visit (HOSPITAL_COMMUNITY): Payer: Self-pay

## 2022-06-03 ENCOUNTER — Telehealth: Payer: Self-pay | Admitting: Family Medicine

## 2022-06-03 DIAGNOSIS — Z1211 Encounter for screening for malignant neoplasm of colon: Secondary | ICD-10-CM

## 2022-06-03 NOTE — Telephone Encounter (Signed)
Can refer- not sure if they will schedule out that far but she can try  [facility] contact Please call to schedule visit and/or procedure Address: [address], [place], [place] [address] Phone: [phone]

## 2022-06-03 NOTE — Telephone Encounter (Signed)
Ok to place referral.

## 2022-06-03 NOTE — Telephone Encounter (Signed)
Patient states: -She is interested in having a routine colonoscopy completed around march of 2024 since she is now 65 - She is not having any pressing symptoms  Patient is scheduled for a physical on 12/20/21, but wanted to get a colonoscopy referral due to GI booking out so far. Will this need an OV? Please advise.

## 2022-06-03 NOTE — Telephone Encounter (Signed)
Referral has been placed. 

## 2022-06-04 NOTE — Telephone Encounter (Signed)
Spoke with the patient and scheduled her for labs on Monday 8/14

## 2022-06-07 ENCOUNTER — Other Ambulatory Visit: Payer: 59

## 2022-06-07 DIAGNOSIS — Z6827 Body mass index (BMI) 27.0-27.9, adult: Secondary | ICD-10-CM | POA: Diagnosis not present

## 2022-06-07 DIAGNOSIS — I739 Peripheral vascular disease, unspecified: Secondary | ICD-10-CM

## 2022-06-07 DIAGNOSIS — E663 Overweight: Secondary | ICD-10-CM

## 2022-06-07 DIAGNOSIS — E7849 Other hyperlipidemia: Secondary | ICD-10-CM | POA: Diagnosis not present

## 2022-06-07 LAB — LIPID PANEL
Chol/HDL Ratio: 2.9 ratio (ref 0.0–4.4)
Cholesterol, Total: 126 mg/dL (ref 100–199)
HDL: 43 mg/dL
LDL Chol Calc (NIH): 58 mg/dL (ref 0–99)
Triglycerides: 144 mg/dL (ref 0–149)
VLDL Cholesterol Cal: 25 mg/dL (ref 5–40)

## 2022-06-08 ENCOUNTER — Other Ambulatory Visit (HOSPITAL_BASED_OUTPATIENT_CLINIC_OR_DEPARTMENT_OTHER): Payer: Self-pay | Admitting: Family

## 2022-06-08 ENCOUNTER — Other Ambulatory Visit (HOSPITAL_COMMUNITY): Payer: Self-pay

## 2022-06-08 MED ORDER — WEGOVY 2.4 MG/0.75ML ~~LOC~~ SOAJ
2.4000 mg | SUBCUTANEOUS | 0 refills | Status: DC
Start: 2022-06-08 — End: 2022-07-06
  Filled 2022-06-08: qty 3, 28d supply, fill #0

## 2022-06-08 NOTE — Telephone Encounter (Signed)
Rx(s) sent to pharmacy electronically.  

## 2022-06-09 ENCOUNTER — Ambulatory Visit: Payer: 59 | Admitting: Family Medicine

## 2022-06-10 NOTE — Progress Notes (Signed)
Cardiology Office Note:    Date:  06/11/2022   ID:  KRISLYNN GRONAU, DOB 01-31-1976, MRN [id number]  PCP:  Marin Olp, MD   [facility] Providers Cardiologist:  Freada Bergeron, MD     Referring MD: Marin Olp, MD   Chief Complaint: hyperlipidemia  History of Present Illness:    Katelyn Shelton is a very pleasant 46 y.o. female with a hx of breast cancer, familial hyperlipidemia, and calcification of iliac artery.  She initially establish care with Dr. Meda Coffee and is now followed by Dr. Johney Frame.  She was diagnosed with metastatic breast cancer in the right breast, estrogen positive in 2018.  Treated with anastrozole (Arimidex), goserelin (Zoladex), and subsequently Denosumab (Prolia) for osteoporosis.  Familial history of hyperlipidemia in her mother and paternal grandfather had MI.  Calcium score of 0 in 2019.  CT of pelvis April 2019 with bilateral common iliac artery atherosclerotic calcification. Previous intolerance to Crestor with significant neuropathy, Livalo caused burning to her thigh.  She was started on Repatha which she tolerates well.  She had bilateral breast implants explanted 03/02/2021 due to right breast cellulitis with fevers and erythema.  Cultures were consistent with MRSA.  She was last seen in our office on 07/06/2021 by Laurann Montana, NP.  At the time she continued to have some incisional soreness at previous drain sites from recent surgery.  Was frustrated by slow weight loss despite restricting carbohydrates and exercising regularly.  PAC was noted on EKG, she was asymptomatic.  No changes were made to her treatment plan and she was advised to follow-up in 1 year with Dr. Johney Frame.  Today, she is here for follow-up. She is concerned about recent elevation in triglycerides on Accutane. Required by dermatologist to have triglycerides checked regularly, had high triglycerides near 300. Results were better on fasting lab work done here 8/14. Admits to  eating more a diet higher in carbohydrates rather than plants. We discussed some tips to incorporate more whole grains and vegetables. Feels that it is difficult for her to take a deep breath at time. No dyspnea on exertion. She denies chest pain, lower extremity edema, fatigue, palpitations, melena, hematuria, hemoptysis, diaphoresis, weakness, presyncope, syncope, orthopnea, and PND.   Past Medical History:  Diagnosis Date   Allergy    Anxiety    venflafaxine XR 75 mg   Breast cancer ([place]) 01/2017   right; genetic testing negative in 2018 with [employer] panel   Heart murmur    as child- went away   History of MRSA infection    elbow   Hyperlipidemia    Infection of breast implant ([place]) 01/19/2021   Joint pain    due to medicine induced joint pain   Migraine    otc med prn   Miscarriage    x1   MRSA infection 01/28/2021   Nausea and vomiting 01/19/2021   Neuromuscular disorder (HCC)    neuropathy bilater feet - ? r/t chemo   Osteopenia    UTI (urinary tract infection)     Past Surgical History:  Procedure Laterality Date   AREOLA/NIPPLE RECONSTRUCTION WITH GRAFT Right 12/01/2018   Procedure: RIGHT NIPPLE AREOLA COMPLEX RECONSTRUCTION WITH FULL THICKNESS SKIN GRAFT FROM RIGHT GROIN;  Surgeon: Irene Limbo, MD;  Location: [place];  Service: Plastics;  Laterality: Right;   BREAST IMPLANT REMOVAL Right 01/20/2021   Procedure: REMOVAL RIGHT BREAST IMPLANT;  Surgeon: Irene Limbo, MD;  Location: [place];  Service: Plastics;  Laterality:  Right;   BREAST IMPLANT REMOVAL Left 03/02/2021   Procedure: REMOVAL LEFT CHEST IMPLANT AND CAPSULECTOMY;  Surgeon: Irene Limbo, MD;  Location: [place];  Service: Plastics;  Laterality: Left;   BREAST RECONSTRUCTION Left 12/01/2018   Procedure: LEFT BREAST REVISION RECONSTRUCTION WITH SKIN EXCISION;  Surgeon: Irene Limbo, MD;  Location: [place];  Service: Plastics;  Laterality: Left;    BREAST RECONSTRUCTION Left 12/01/2018   Procedure: COMPOSITE GRAFT FROM LEFT NIPPLE (NIPPLE SHARING);  Surgeon: Irene Limbo, MD;  Location: [place];  Service: Plastics;  Laterality: Left;   BREAST RECONSTRUCTION WITH PLACEMENT OF TISSUE EXPANDER AND FLEX HD (ACELLULAR HYDRATED DERMIS) Bilateral 02/28/2017   Procedure: BILATERAL BREAST RECONSTRUCTION WITH PLACEMENT OF TISSUE EXPANDER AND ALLODERM;  Surgeon: Irene Limbo, MD;  Location: [place];  Service: Plastics;  Laterality: Bilateral;   CESAREAN SECTION  2008,2010,2013   x 3   CESAREAN SECTION  02/04/2012   Procedure: CESAREAN SECTION;  Surgeon: Cyril Mourning, MD;  Location: [facility];  Service: Gynecology;  Laterality: N/A;  Repeat Cesarean Section Delivery  Boy  @  [phone], Apgars 9/10   COLONOSCOPY     polyp   DILATION AND CURETTAGE OF UTERUS     EXCISION OF BREAST LESION Right 03/15/2017   Procedure: RIGHT NIPPLE AND AREOLA EXCISION;  Surgeon: Rolm Bookbinder, MD;  Location: [place];  Service: General;  Laterality: Right;   LAPAROSCOPIC VAGINAL HYSTERECTOMY WITH SALPINGO OOPHORECTOMY Bilateral 06/29/2018   Procedure: LAPAROSCOPIC ASSISTED VAGINAL HYSTERECTOMY WITH SALPINGO OOPHORECTOMY;  Surgeon: Louretta Shorten, MD;  Location: [place];  Service: Gynecology;  Laterality: Bilateral;  Request case for [facility] CRNA   LIPOSUCTION WITH LIPOFILLING N/A 07/25/2018   Procedure: LIPOSUCTION WITH LIPOFILLING FROM ABDOMEN TO CHEST;  Surgeon: Irene Limbo, MD;  Location: [place];  Service: Plastics;  Laterality: N/A;   LIPOSUCTION WITH LIPOFILLING Bilateral 12/01/2018   Procedure: LIPOFILLING TO BILATERAL CHEST REVISION;  Surgeon: Irene Limbo, MD;  Location: [place];  Service: Plastics;  Laterality: Bilateral;   NIPPLE SPARING MASTECTOMY Bilateral 02/28/2017   Procedure: RIGHT NIPPLE SPARING MASTECTOMY; LEFT PROPHYLACTIC NIPPLE SPARING MASTECTOMY;  Surgeon:  Rolm Bookbinder, MD;  Location: [place];  Service: General;  Laterality: Bilateral;   PECTUS EXCAVATUM REPAIR  681   46 years old- very large implant   REMOVAL OF TISSUE EXPANDER AND PLACEMENT OF IMPLANT Bilateral 07/25/2018   Procedure: REMOVAL OF TISSUE EXPANDER AND PLACEMENT OF SILICONE IMPLANT;  Surgeon: Irene Limbo, MD;  Location: [place];  Service: Plastics;  Laterality: Bilateral;   SENTINEL NODE BIOPSY Right 02/28/2017   Procedure: RIGHT AXILLARY SENTINEL LYMPH  NODE BIOPSY;  Surgeon: Rolm Bookbinder, MD;  Location: [place];  Service: General;  Laterality: Right;   VULVAR LESION REMOVAL     WISDOM TOOTH EXTRACTION      Current Medications: Current Meds  Medication Sig   buPROPion (WELLBUTRIN XL) 150 MG 24 hr tablet Take 1 tablet (150 mg total) by mouth daily.   Calcium Carbonate (CALCIUM 600 PO) Take 1 capsule by mouth daily.   Evolocumab (REPATHA SURECLICK) 559 MG/ML SOAJ Inject 140 mg (1 pen) into the skin every 14 (fourteen) days.   exemestane (AROMASIN) 25 MG tablet Take 1 tablet (25 mg total) by mouth daily after breakfast.   ibandronate (BONIVA) 150 MG tablet Take in the morning every 28 days; take with a full glass of water, on an empty  stomach, and do not take anything else by mouth or lie down for the next 60 min.   ISOtretinoin (ACCUTANE) 40 MG capsule Take 1 capsule by mouth once a day   Multiple Vitamins-Minerals (MULTIVITAMIN ADULT EXTRA C PO) Take 1 capsule by mouth daily.   Multiple Vitamins-Minerals (MULTIVITAMIN WOMEN 50+) TABS Take 1 tablet by mouth daily.   Semaglutide-Weight Management (WEGOVY) 2.4 MG/0.75ML SOAJ Inject 2.4 mg into the skin once a week. Need Appointment     Allergies:   Fentanyl, Peach flavor, Penicillins, Sulfa antibiotics, Sulfasalazine, and Latex   Social History   Socioeconomic History   Marital status: Married    Spouse name: [name]   Number of children: Not on file    Years of education: Not on file   Highest education level: Not on file  Occupational History   Not on file  Tobacco Use   Smoking status: Never   Smokeless tobacco: Never  Vaping Use   Vaping Use: Never used  Substance and Sexual Activity   Alcohol use: Yes    Alcohol/week: 1.0 - 2.0 standard drink of alcohol    Types: 1 - 2 Standard drinks or equivalent per week    Comment: occasional wine - twice monthly   Drug use: No   Sexual activity: Yes    Birth control/protection: Surgical  Other Topics Concern   Not on file  Social History Narrative   Married to Dr. Aundra Dubin. 3 children.    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    Masters in public history at [employer]. Undergrad at [employer]: running, reading, exercise   Social Determinants of Radio broadcast assistant Strain: Not on file  Food Insecurity: Not on file  Transportation Needs: Not on file  Physical Activity: Not on file  Stress: Not on file  Social Connections: Not on file     Family History: The patient's family history includes Alzheimer's disease in her paternal grandfather; Breast cancer in her cousin; Colon polyps in her maternal uncle, mother, and sister; Depression in her maternal grandfather; Diabetes in her maternal aunt; Early death in her maternal grandfather; Heart attack in her maternal grandfather; Heart failure in her maternal grandmother; Hyperlipidemia in her father and mother; Hypertension in her mother; Miscarriages / Stillbirths in her sister; Parkinson's disease (age of onset: 90) in her sister; Prostate cancer in her paternal grandfather; Skin cancer in her mother; Stomach cancer in her paternal grandmother; Stroke in her maternal grandmother. There is no history of Colon cancer or Rectal cancer.  ROS:   Please see the history of present illness.   All other systems reviewed and are negative.  Labs/Other Studies Reviewed:    The following studies  were reviewed today:  CT Calcium score 03/30/18  Ascending Aorta: Normal size.  No calcifications.   Pericardium: Normal.   Coronary arteries: Normal.   IMPRESSION: Coronary calcium score of 0. This was 0 percentile for age and sex matched control.  CT Pelvis 02/22/18  IMPRESSION: 1. A cause for the patient's symptoms is not identified. 2. Sclerotic lesions in the right iliac bone and right acetabulum are stable from 2015 and accordingly unlikely to represent active malignancy. 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 3. Bilateral common iliac artery  atherosclerotic calcification.  Recent Labs: 03/10/2022: ALT 20; BUN 14; Creatinine 0.75; Hemoglobin 14.6; Platelet Count 240; Potassium 4.2; Sodium 141  Recent Lipid Panel    Component Value Date/Time   CHOL 126 06/07/2022 0722   TRIG 144 06/07/2022 0722   HDL 43 06/07/2022 0722   CHOLHDL 2.9 06/07/2022 0722   CHOLHDL 2 12/28/2021 1037   VLDL 22.4 12/28/2021 1037   LDLCALC 58 06/07/2022 0722     Risk Assessment/Calculations:       Physical Exam:    VS:  BP 128/70   Pulse 64   Ht 5' (1.524 m)   Wt 117 lb (53.1 kg)   LMP 09/01/2017   SpO2 98%   BMI 22.85 kg/m     Wt Readings from Last 3 Encounters:  06/11/22 117 lb (53.1 kg)  03/10/22 122 lb 4.8 oz (55.5 kg)  12/08/21 122 lb 12.8 oz (55.7 kg)     GEN:  Well nourished, well developed in no acute distress HEENT: Normal NECK: No JVD; No carotid bruits CARDIAC: RRR, no murmurs, rubs, gallops RESPIRATORY:  Clear to auscultation without rales, wheezing or rhonchi  ABDOMEN: Soft, non-tender, non-distended MUSCULOSKELETAL:  No edema; No deformity. 2+ pedal pulses, equal bilaterally SKIN: Warm and dry NEUROLOGIC:  Alert and oriented x 3 PSYCHIATRIC:  Normal affect   EKG:  EKG is ordered  today.  The ekg ordered today demonstrates NSR with sinus arrhythmia at 64 bpm  Diagnoses:    1. Status post administration of cardiotoxic chemotherapy   2. Peripheral arterial disease (HCC)   3. Statin intolerance   4. Hyperlipidemia LDL goal <70   5. Hypertriglyceridemia    Assessment and Plan:     Hyperlipidemia/hypertriglyceridemia: LDL 58 on 06/07/22. Triglycerides were elevated on non-fasting lab work by dermatologist, normal on lab work done here at which time she was fasting. Reassurance that fluctuations in triglyceride levels are not concerning. Advised continued monitoring. History of statin tolerance. Tolerating Repatha with no adverse effects. Continue Repatha. We discussed ways to incorporate more plant based foods into her diet.    PAD: Calcification of iliac arteries identified on prior CT of pelvis. No claudication symptoms. Had symptoms of neuropathy after chemotherapy which have resolved. No indication to repeat imaging at this time.   Breast cancer s/p chemotherapy: Reviewed chemotherapy agents with Fuller Canada, RPH. There is a 5% cardiotoxicity rate with anastrozole. Np prior echocardiogram on file. Reports history of murmur in childhood. Through discussion and shared decision making, we decided to order an echocardiogram for evaluation of heart function.   Disposition: 1 year with Dr. Johney Frame   Medication Adjustments/Labs and Tests Ordered: Current medicines are reviewed at length with the patient today.  Concerns regarding medicines are outlined above.  Orders Placed This Encounter  Procedures   EKG 12-Lead   ECHOCARDIOGRAM COMPLETE   No orders of the defined types were placed in this encounter.   Patient Instructions  Medication Instructions:   Your physician recommends that you continue on your current medications as directed. Please refer to the Current Medication list given to you today.   *If you need a refill on your cardiac medications before your  next appointment, please call your pharmacy*   Lab Work:  None ordered.  If you have labs (blood work) drawn today and your tests are completely normal, you will receive your results only by: [place] (if you have MyChart) OR A paper copy in the mail If you have any lab test that is abnormal  or we need to change your treatment, we will call you to review the results.   Testing/Procedures:  Your physician has requested that you have an echocardiogram. Echocardiography is a painless test that uses sound waves to create images of your heart. It provides your doctor with information about the size and shape of your heart and how well your heart's chambers and valves are working. This procedure takes approximately one hour. There are no restrictions for this procedure.    Follow-Up: At [facility], you and your health needs are our priority.  As part of our continuing mission to provide you with exceptional heart care, we have created designated Provider Care Teams.  These Care Teams include your primary Cardiologist (physician) and Advanced Practice Providers (APPs -  Physician Assistants and Nurse Practitioners) who all work together to provide you with the care you need, when you need it.  We recommend signing up for the patient portal called "MyChart".  Sign up information is provided on this After Visit Summary.  MyChart is used to connect with patients for Virtual Visits (Telemedicine).  Patients are able to view lab/test results, encounter notes, upcoming appointments, etc.  Non-urgent messages can be sent to your provider as well.   To learn more about what you can do with MyChart, go to [email or web address].    Your next appointment:   1 year(s)  The format for your next appointment:   In Person  Provider:   Freada Bergeron, MD     Other Instructions  Your physician wants you to follow-up in: 1 year with Dr. Johney Frame.  You will receive a reminder letter in  the mail two months in advance. If you don't receive a letter, please call our office to schedule the follow-up appointment.   Important Information About Sugar         Signed, Emmaline Life, NP  06/11/2022 1:00 PM    [place]

## 2022-06-11 ENCOUNTER — Encounter: Payer: Self-pay | Admitting: Nurse Practitioner

## 2022-06-11 ENCOUNTER — Ambulatory Visit: Payer: 59 | Admitting: Nurse Practitioner

## 2022-06-11 VITALS — BP 128/70 | HR 64 | Ht 60.0 in | Wt 117.0 lb

## 2022-06-11 DIAGNOSIS — Z9221 Personal history of antineoplastic chemotherapy: Secondary | ICD-10-CM | POA: Diagnosis not present

## 2022-06-11 DIAGNOSIS — E785 Hyperlipidemia, unspecified: Secondary | ICD-10-CM | POA: Diagnosis not present

## 2022-06-11 DIAGNOSIS — Z789 Other specified health status: Secondary | ICD-10-CM | POA: Diagnosis not present

## 2022-06-11 DIAGNOSIS — E781 Pure hyperglyceridemia: Secondary | ICD-10-CM | POA: Diagnosis not present

## 2022-06-11 DIAGNOSIS — I739 Peripheral vascular disease, unspecified: Secondary | ICD-10-CM | POA: Diagnosis not present

## 2022-06-11 NOTE — Patient Instructions (Signed)
Medication Instructions:   Your physician recommends that you continue on your current medications as directed. Please refer to the Current Medication list given to you today.   *If you need a refill on your cardiac medications before your next appointment, please call your pharmacy*   Lab Work:  None ordered.  If you have labs (blood work) drawn today and your tests are completely normal, you will receive your results only by: [place] (if you have MyChart) OR A paper copy in the mail If you have any lab test that is abnormal or we need to change your treatment, we will call you to review the results.   Testing/Procedures:  Your physician has requested that you have an echocardiogram. Echocardiography is a painless test that uses sound waves to create images of your heart. It provides your doctor with information about the size and shape of your heart and how well your heart's chambers and valves are working. This procedure takes approximately one hour. There are no restrictions for this procedure.    Follow-Up: At [facility], you and your health needs are our priority.  As part of our continuing mission to provide you with exceptional heart care, we have created designated Provider Care Teams.  These Care Teams include your primary Cardiologist (physician) and Advanced Practice Providers (APPs -  Physician Assistants and Nurse Practitioners) who all work together to provide you with the care you need, when you need it.  We recommend signing up for the patient portal called "MyChart".  Sign up information is provided on this After Visit Summary.  MyChart is used to connect with patients for Virtual Visits (Telemedicine).  Patients are able to view lab/test results, encounter notes, upcoming appointments, etc.  Non-urgent messages can be sent to your provider as well.   To learn more about what you can do with MyChart, go to [email or web address].    Your next appointment:    1 year(s)  The format for your next appointment:   In Person  Provider:   Freada Bergeron, MD     Other Instructions  Your physician wants you to follow-up in: 1 year with Dr. Johney Frame.  You will receive a reminder letter in the mail two months in advance. If you don't receive a letter, please call our office to schedule the follow-up appointment.   Important Information About Sugar

## 2022-07-01 ENCOUNTER — Other Ambulatory Visit (HOSPITAL_COMMUNITY): Payer: Self-pay

## 2022-07-01 DIAGNOSIS — L700 Acne vulgaris: Secondary | ICD-10-CM | POA: Diagnosis not present

## 2022-07-01 DIAGNOSIS — Z79899 Other long term (current) drug therapy: Secondary | ICD-10-CM | POA: Diagnosis not present

## 2022-07-01 MED ORDER — ISOTRETINOIN 40 MG PO CAPS
40.0000 mg | ORAL_CAPSULE | Freq: Every day | ORAL | 0 refills | Status: DC
  Filled 2022-07-01: qty 30, 30d supply, fill #0

## 2022-07-06 ENCOUNTER — Other Ambulatory Visit (HOSPITAL_BASED_OUTPATIENT_CLINIC_OR_DEPARTMENT_OTHER): Payer: Self-pay | Admitting: Family

## 2022-07-06 ENCOUNTER — Other Ambulatory Visit (HOSPITAL_COMMUNITY): Payer: Self-pay

## 2022-07-07 ENCOUNTER — Other Ambulatory Visit: Payer: Self-pay | Admitting: Pharmacist

## 2022-07-07 ENCOUNTER — Telehealth: Payer: Self-pay | Admitting: *Deleted

## 2022-07-07 ENCOUNTER — Other Ambulatory Visit (HOSPITAL_COMMUNITY): Payer: Self-pay

## 2022-07-07 MED ORDER — WEGOVY 2.4 MG/0.75ML ~~LOC~~ SOAJ
2.4000 mg | SUBCUTANEOUS | 0 refills | Status: DC
  Filled 2022-07-07: qty 3, 28d supply, fill #0

## 2022-07-07 MED ORDER — WEGOVY 2.4 MG/0.75ML ~~LOC~~ SOAJ
2.4000 mg | SUBCUTANEOUS | 11 refills | Status: DC
  Filled 2022-07-07 – 2022-07-29 (×2): qty 3, 28d supply, fill #0
  Filled 2022-08-26: qty 3, 28d supply, fill #1
  Filled 2022-09-22: qty 3, 28d supply, fill #2
  Filled 2022-10-26 (×2): qty 3, 28d supply, fill #3
  Filled 2022-11-22: qty 3, 28d supply, fill #4
  Filled 2022-12-20: qty 3, 28d supply, fill #5
  Filled 2023-01-07 – 2023-01-12 (×3): qty 3, 28d supply, fill #6
  Filled 2023-02-10: qty 3, 28d supply, fill #7

## 2022-07-07 NOTE — Telephone Encounter (Signed)
Patient called the office requesting Rx for [facility]. She stated that "she is due for her shot on Monday."  [place]. Patient can be reached at [phone]. Thank you

## 2022-07-07 NOTE — Telephone Encounter (Signed)
Refill sent in in refill encounter

## 2022-07-07 NOTE — Telephone Encounter (Signed)
Please review for refill. Thank you! 

## 2022-07-12 DIAGNOSIS — Z9013 Acquired absence of bilateral breasts and nipples: Secondary | ICD-10-CM | POA: Diagnosis not present

## 2022-07-12 DIAGNOSIS — Z170 Estrogen receptor positive status [ER+]: Secondary | ICD-10-CM | POA: Diagnosis not present

## 2022-07-12 DIAGNOSIS — C50411 Malignant neoplasm of upper-outer quadrant of right female breast: Secondary | ICD-10-CM | POA: Diagnosis not present

## 2022-07-19 ENCOUNTER — Encounter: Payer: Self-pay | Admitting: *Deleted

## 2022-07-27 ENCOUNTER — Ambulatory Visit (HOSPITAL_COMMUNITY): Payer: 59 | Attending: Cardiology

## 2022-07-27 DIAGNOSIS — Z9221 Personal history of antineoplastic chemotherapy: Secondary | ICD-10-CM | POA: Diagnosis not present

## 2022-07-27 DIAGNOSIS — Z0189 Encounter for other specified special examinations: Secondary | ICD-10-CM

## 2022-07-27 LAB — ECHOCARDIOGRAM COMPLETE
Area-P 1/2: 3.31 cm2
S' Lateral: 3.1 cm

## 2022-07-28 ENCOUNTER — Ambulatory Visit: Payer: 59

## 2022-07-29 ENCOUNTER — Other Ambulatory Visit (HOSPITAL_COMMUNITY): Payer: Self-pay

## 2022-07-29 ENCOUNTER — Other Ambulatory Visit: Payer: Self-pay | Admitting: Oncology

## 2022-07-29 DIAGNOSIS — Z170 Estrogen receptor positive status [ER+]: Secondary | ICD-10-CM

## 2022-07-29 MED ORDER — EXEMESTANE 25 MG PO TABS
25.0000 mg | ORAL_TABLET | Freq: Every day | ORAL | 3 refills | Status: DC
  Filled 2022-07-29 – 2022-08-26 (×2): qty 90, 90d supply, fill #0
  Filled 2022-11-22: qty 90, 90d supply, fill #1
  Filled 2023-02-21: qty 90, 90d supply, fill #2
  Filled 2023-05-23: qty 90, 90d supply, fill #3

## 2022-08-02 ENCOUNTER — Other Ambulatory Visit (HOSPITAL_COMMUNITY): Payer: Self-pay

## 2022-08-03 ENCOUNTER — Other Ambulatory Visit (HOSPITAL_COMMUNITY): Payer: Self-pay

## 2022-08-03 DIAGNOSIS — Z79899 Other long term (current) drug therapy: Secondary | ICD-10-CM | POA: Diagnosis not present

## 2022-08-03 DIAGNOSIS — L700 Acne vulgaris: Secondary | ICD-10-CM | POA: Diagnosis not present

## 2022-08-03 MED ORDER — ISOTRETINOIN 40 MG PO CAPS
40.0000 mg | ORAL_CAPSULE | Freq: Every day | ORAL | 0 refills | Status: DC
  Filled 2022-08-10: qty 30, 30d supply, fill #0

## 2022-08-10 ENCOUNTER — Other Ambulatory Visit (HOSPITAL_COMMUNITY): Payer: Self-pay

## 2022-08-26 ENCOUNTER — Encounter: Payer: Self-pay | Admitting: Oncology

## 2022-08-26 ENCOUNTER — Other Ambulatory Visit (HOSPITAL_COMMUNITY): Payer: Self-pay

## 2022-09-02 ENCOUNTER — Other Ambulatory Visit (HOSPITAL_COMMUNITY): Payer: Self-pay

## 2022-09-05 NOTE — Progress Notes (Signed)
Patient Care Team: Marin Olp, MD as PCP - General (Family Medicine) Freada Bergeron, MD as PCP - Cardiology (Cardiology) Magrinat, Virgie Dad, MD (Inactive) as Consulting Physician (Oncology) Rolm Bookbinder, MD as Consulting Physician (General Surgery) Louretta Shorten, MD as Consulting Physician (Obstetrics and Gynecology) Juanita Craver, Gerrie Nordmann, MD as Referring Physician (Hematology and Oncology) Kyung Rudd, MD as Consulting Physician (Radiation Oncology) Irene Limbo, MD as Consulting Physician (Plastic Surgery) Muss, Demaris Callander as Consulting Physician (Internal Medicine) Kathrynn Ducking, MD (Inactive) as Consulting Physician (Neurology) Dorothy Spark, MD as Consulting Physician (Cardiology)  DIAGNOSIS:  Encounter Diagnosis  Name Primary?   Malignant neoplasm of upper-outer quadrant of right breast in female, estrogen receptor positive ([place]) Yes    SUMMARY OF ONCOLOGIC HISTORY: Oncology History  Malignant neoplasm of upper-outer quadrant of right breast in female, estrogen receptor positive ([place])  02/11/2017 Initial Diagnosis   Malignant neoplasm of upper-outer quadrant of right breast in female, estrogen receptor positive ([place])   02/21/2017 Genetic Testing   Negative genetic testing on the STAT panel through [facility].  The STAT gene panel offered by [facility] and includes sequencing and rearrangement analysis for the following 9 genes: ATM, BRCA1, BRCA2, CDH1, CHEK2, PALB2, PTEN, STK11, and TP53.  The report date is February 19, 2017.  We re-requisitioned for a larger Multi gene panel, which was also negative.  The Multi-Gene Panel offered by [facility] includes sequencing and/or deletion duplication testing of the following 80 genes: ALK, APC, ATM, AXIN2,BAP1,  BARD1, BLM, BMPR1A, BRCA1, BRCA2, BRIP1, CASR, CDC73, CDH1, CDK4, CDKN1B, CDKN1C, CDKN2A (p14ARF), CDKN2A (p16INK4a), CEBPA, CHEK2, CTNNA1, DICER1, DIS3L2, EGFR (c.2369C>T, p.Thr790Met variant  only), EPCAM (Deletion/duplication testing only), FH, FLCN, GATA2, GPC3, GREM1 (Promoter region deletion/duplication testing only), HOXB13 (c.251G>A, p.Gly84Glu), HRAS, KIT, MAX, MEN1, MET, MITF (c.952G>A, p.Glu318Lys variant only), MLH1, MSH2, MSH3, MSH6, MUTYH, NBN, NF1, NF2, NTHL1, PALB2, PDGFRA, PHOX2B, PMS2, POLD1, POLE, POT1, PRKAR1A, PTCH1, PTEN, RAD50, RAD51C, RAD51D, RB1, RECQL4, RET, RUNX1, SDHAF2, SDHA (sequence changes only), SDHB, SDHC, SDHD, SMAD4, SMARCA4, SMARCB1, SMARCE1, STK11, SUFU, TERT, TERT, TMEM127, TP53, TSC1, TSC2, VHL, WRN and WT1.  The report date is February 21, 2017.      CHIEF COMPLIANT: Follow-up Estrogen receptor positive breast cancer  on Anastrozole   INTERVAL HISTORY: Katelyn Shelton is a 46 y.o. with the above- mentioned Estrogen receptor positive breast cancer (s/p bilateral mastectomies) She presents to the clinic today for a follow-up. She reports that when she takes the Wellbutrin she is not in pain. Soon as she goes off she is in pain. She also says she has a lot of knots under her right arm that she noticed. She complains of low sex drive.   ALLERGIES:  is allergic to fentanyl, peach flavor, penicillins, sulfa antibiotics, sulfasalazine, and latex.  MEDICATIONS:  Current Outpatient Medications  Medication Sig Dispense Refill   AFLURIA QUADRIVALENT injection Inject 0.5 mLs into the muscle.     buPROPion (WELLBUTRIN XL) 150 MG 24 hr tablet Take 1 tablet (150 mg total) by mouth daily. 90 tablet 3   Calcium Carbonate (CALCIUM 600 PO) Take 1 capsule by mouth daily.     Evolocumab (REPATHA SURECLICK) 867 MG/ML SOAJ Inject 140 mg (1 pen) into the skin every 14 (fourteen) days. 2 mL 11   exemestane (AROMASIN) 25 MG tablet Take 1 tablet (25 mg total) by mouth daily after breakfast. 90 tablet 3   ibandronate (BONIVA) 150 MG tablet Take in the morning every 28 days;  take with a full glass of water, on an empty stomach, and do not take anything else by mouth or lie down  for the next 60 min. 4 tablet 4   ISOtretinoin (ACCUTANE) 40 MG capsule Take 1 capsule (40 mg total) by mouth daily  with a fatty meal 30 capsule 0   Multiple Vitamins-Minerals (MULTIVITAMIN ADULT EXTRA C PO) Take 1 capsule by mouth daily.     Multiple Vitamins-Minerals (MULTIVITAMIN WOMEN 50+) TABS Take 1 tablet by mouth daily.     Semaglutide-Weight Management (WEGOVY) 2.4 MG/0.75ML SOAJ Inject 2.4 mg into the skin once a week. 3 mL 11   SPIKEVAX syringe 0.5 mLs.     No current facility-administered medications for this visit.    PHYSICAL EXAMINATION: ECOG PERFORMANCE STATUS: 1 - Symptomatic but completely ambulatory  Vitals:   09/10/22 0919  BP: (!) 109/53  Pulse: 91  Resp: 18  Temp: 97.7 F (36.5 C)  SpO2: 100%   Filed Weights   09/10/22 0919  Weight: 115 lb 11.2 oz (52.5 kg)    BREAST: No palpable masses or nodules in either right or left breasts. No palpable axillary supraclavicular or infraclavicular adenopathy no breast tenderness or nipple discharge. (exam performed in the presence of a chaperone)  LABORATORY DATA:  I have reviewed the data as listed    Latest Ref Rng & Units 09/10/2022    9:00 AM 03/10/2022    9:00 AM 12/28/2021   10:37 AM  CMP  Glucose 70 - 99 mg/dL 91  89  78   BUN 6 - 20 mg/dL _0 Creatinine 0.44 - 1.00 mg/dL 0.65  0.75  0.65   Sodium 135 - 145 mmol/L 141  141  141   Potassium 3.5 - 5.1 mmol/L 4.1  4.2  4.2   Chloride 98 - 111 mmol/L 109  105  104   CO2 22 - 32 mmol/L _1 Calcium 8.9 - 10.3 mg/dL 8.4  9.6  9.8   Total Protein 6.5 - 8.1 g/dL 7.6  7.5  7.5   Total Bilirubin 0.3 - 1.2 mg/dL 0.5  0.6  0.5   Alkaline Phos 38 - 126 U/L 62  60  52   AST 15 - 41 U/L _2 ALT 0 - 44 U/L _3 Lab Results  Component Value Date   WBC 4.1 09/10/2022   HGB 14.0 09/10/2022   HCT 40.6 09/10/2022   MCV 94.4 09/10/2022   PLT 221 09/10/2022   NEUTROABS 2.6 09/10/2022    ASSESSMENT & PLAN:  Malignant neoplasm of  upper-outer quadrant of right breast in female, estrogen receptor positive ([place]) 02/08/17: T2 N0, stage IB IDC, grade 2, ER/PR positive, HER-2 nonamplified, with a Ki67-1 of 5% Oncotype 11 (9% ROR) 02/28/2017: Bilateral mastectomies: Left: Intraductal papilloma, right: T2N1 stage IIa grade 1 IDC (implants got infected and removed), MammaPrint: Low risk 02/11/2017: Tamoxifen started neoadjuvantly and held during chemo 02/19/2017: Genetics: Negative 04/15/2017-08/03/2017: Adjuvant chemo with Taxotere and Cytoxan every 3 weeks x4 at [facility] 09/30/2017: Completed radiation 10/25/2017: Anastrozole with goserelin until August 2019, underwent hysterectomy with BSO 06/29/2018 (plan is for 7 years)   Anastrozole toxicities: Severe vaginal dryness: She had done [facility] touch and tells me it has not helped her.  She might consider going back to do it again. Weight gain secondary to anastrozole: She is on  Ozempic and has lost all the weight. Decreased sexual desire: Patient wants to try Osphena Musculoskeletal aches and pains   Breast cancer surveillance: no role of imaging studies since she had bilateral mastectomies Breast exam 09/10/2022: Benign  Return to clinic in 6 months for follow-up    Orders Placed This Encounter  Procedures   CBC with Differential ([place] Only)    Standing Status:   Future    Standing Expiration Date:   09/11/2023   CMP ([place] only)    Standing Status:   Future    Standing Expiration Date:   09/11/2023   Lipid panel    Standing Status:   Future    Standing Expiration Date:   09/10/2023   The patient has a good understanding of the overall plan. she agrees with it. she will call with any problems that may develop before the next visit here. Total time spent: 30 mins including face to face time and time spent for planning, charting and co-ordination of care   Harriette Ohara, MD 09/10/22    I Gardiner Coins am scribing for Dr. Lindi Adie  I have  reviewed the above documentation for accuracy and completeness, and I agree with the above.

## 2022-09-06 ENCOUNTER — Other Ambulatory Visit (HOSPITAL_COMMUNITY): Payer: Self-pay

## 2022-09-06 DIAGNOSIS — Z79899 Other long term (current) drug therapy: Secondary | ICD-10-CM | POA: Diagnosis not present

## 2022-09-06 DIAGNOSIS — L700 Acne vulgaris: Secondary | ICD-10-CM | POA: Diagnosis not present

## 2022-09-06 DIAGNOSIS — L738 Other specified follicular disorders: Secondary | ICD-10-CM | POA: Diagnosis not present

## 2022-09-06 MED ORDER — ISOTRETINOIN 40 MG PO CAPS
40.0000 mg | ORAL_CAPSULE | Freq: Every day | ORAL | 0 refills | Status: DC
  Filled 2022-09-06: qty 30, 30d supply, fill #0

## 2022-09-09 ENCOUNTER — Other Ambulatory Visit: Payer: Self-pay | Admitting: *Deleted

## 2022-09-09 DIAGNOSIS — Z170 Estrogen receptor positive status [ER+]: Secondary | ICD-10-CM

## 2022-09-10 ENCOUNTER — Inpatient Hospital Stay: Payer: 59 | Admitting: Hematology and Oncology

## 2022-09-10 ENCOUNTER — Other Ambulatory Visit: Payer: Self-pay | Admitting: *Deleted

## 2022-09-10 ENCOUNTER — Inpatient Hospital Stay: Payer: 59 | Attending: Hematology and Oncology

## 2022-09-10 ENCOUNTER — Other Ambulatory Visit: Payer: Self-pay | Admitting: Hematology and Oncology

## 2022-09-10 VITALS — BP 109/53 | HR 91 | Temp 97.7°F | Resp 18 | Ht 60.0 in | Wt 115.7 lb

## 2022-09-10 DIAGNOSIS — Z79811 Long term (current) use of aromatase inhibitors: Secondary | ICD-10-CM | POA: Insufficient documentation

## 2022-09-10 DIAGNOSIS — Z9013 Acquired absence of bilateral breasts and nipples: Secondary | ICD-10-CM | POA: Insufficient documentation

## 2022-09-10 DIAGNOSIS — Z170 Estrogen receptor positive status [ER+]: Secondary | ICD-10-CM

## 2022-09-10 DIAGNOSIS — R6882 Decreased libido: Secondary | ICD-10-CM | POA: Diagnosis not present

## 2022-09-10 DIAGNOSIS — E7849 Other hyperlipidemia: Secondary | ICD-10-CM

## 2022-09-10 DIAGNOSIS — C50411 Malignant neoplasm of upper-outer quadrant of right female breast: Secondary | ICD-10-CM

## 2022-09-10 DIAGNOSIS — Z79899 Other long term (current) drug therapy: Secondary | ICD-10-CM | POA: Insufficient documentation

## 2022-09-10 LAB — LIPID PANEL
Cholesterol: 99 mg/dL (ref 0–200)
HDL: 41 mg/dL (ref >40)
LDL Cholesterol: 39 mg/dL (ref 0–99)
Total CHOL/HDL Ratio: 2.4 RATIO
Triglycerides: 95 mg/dL (ref <150)
VLDL: 19 mg/dL (ref 0–40)

## 2022-09-10 LAB — CMP (CANCER CENTER ONLY)
ALT: 16 U/L (ref 0–44)
AST: 20 U/L (ref 15–41)
Albumin: 4.4 g/dL (ref 3.5–5.0)
Alkaline Phosphatase: 62 U/L (ref 38–126)
Anion gap: 6 (ref 5–15)
BUN: 11 mg/dL (ref 6–20)
CO2: 26 mmol/L (ref 22–32)
Calcium: 8.4 mg/dL — ABNORMAL LOW (ref 8.9–10.3)
Chloride: 109 mmol/L (ref 98–111)
Creatinine: 0.65 mg/dL (ref 0.44–1.00)
GFR, Estimated: >60 mL/min (ref >60)
Glucose, Bld: 91 mg/dL (ref 70–99)
Potassium: 4.1 mmol/L (ref 3.5–5.1)
Sodium: 141 mmol/L (ref 135–145)
Total Bilirubin: 0.5 mg/dL (ref 0.3–1.2)
Total Protein: 7.6 g/dL (ref 6.5–8.1)

## 2022-09-10 LAB — CBC WITH DIFFERENTIAL (CANCER CENTER ONLY)
Abs Immature Granulocytes: 0.02 10*3/uL (ref 0.00–0.07)
Basophils Absolute: 0.1 10*3/uL (ref 0.0–0.1)
Basophils Relative: 1 %
Eosinophils Absolute: 0.1 10*3/uL (ref 0.0–0.5)
Eosinophils Relative: 2 %
HCT: 40.6 % (ref 36.0–46.0)
Hemoglobin: 14 g/dL (ref 12.0–15.0)
Immature Granulocytes: 1 %
Lymphocytes Relative: 22 %
Lymphs Abs: 0.9 10*3/uL (ref 0.7–4.0)
MCH: 32.6 pg (ref 26.0–34.0)
MCHC: 34.5 g/dL (ref 30.0–36.0)
MCV: 94.4 fL (ref 80.0–100.0)
Monocytes Absolute: 0.4 10*3/uL (ref 0.1–1.0)
Monocytes Relative: 10 %
Neutro Abs: 2.6 10*3/uL (ref 1.7–7.7)
Neutrophils Relative %: 64 %
Platelet Count: 221 10*3/uL (ref 150–400)
RBC: 4.3 MIL/uL (ref 3.87–5.11)
RDW: 12.5 % (ref 11.5–15.5)
WBC Count: 4.1 10*3/uL (ref 4.0–10.5)
nRBC: 0 % (ref 0.0–0.2)

## 2022-09-10 NOTE — Assessment & Plan Note (Signed)
02/08/17: T2 N0, stage IB IDC, grade 2, ER/PR positive, HER-2 nonamplified, with a Ki67-1 of 5% Oncotype 11 (9% ROR) 02/28/2017: Bilateral mastectomies: Left: Intraductal papilloma, right: T2N1 stage IIa grade 1 IDC (implants got infected and removed), MammaPrint: Low risk 02/11/2017: Tamoxifen started neoadjuvantly and held during chemo 02/19/2017: Genetics: Negative 04/15/2017-08/03/2017: Adjuvant chemo with Taxotere and Cytoxan every 3 weeks x4 at [facility] 09/30/2017: Completed radiation 10/25/2017: Anastrozole with goserelin until August 2019, underwent hysterectomy with BSO 06/29/2018 (plan is for 7 years)   Anastrozole toxicities: Severe vaginal dryness: She had done [facility] touch and tells me it has not helped her. Weight gain secondary to anastrozole: She is on Ozempic and has lost all the weight. Decreased sexual desire Musculoskeletal aches and pains: I encouraged her to use CBD oil along with turmeric and tonic water.   Breast cancer surveillance: no role of imaging studies since she had bilateral mastectomies Breast exam 09/10/2022: Benign  Return to clinic in 1 year for follow-up

## 2022-09-22 ENCOUNTER — Other Ambulatory Visit (HOSPITAL_COMMUNITY): Payer: Self-pay

## 2022-09-27 ENCOUNTER — Ambulatory Visit
Admission: RE | Admit: 2022-09-27 | Discharge: 2022-09-27 | Disposition: A | Discharge status: Home / Self Care | Payer: 59 | Source: Ambulatory Visit | Attending: Hematology and Oncology | Admitting: Hematology and Oncology

## 2022-09-27 ENCOUNTER — Encounter: Payer: Self-pay | Admitting: Hematology and Oncology

## 2022-09-27 DIAGNOSIS — M85852 Other specified disorders of bone density and structure, left thigh: Secondary | ICD-10-CM | POA: Diagnosis not present

## 2022-09-27 DIAGNOSIS — C50411 Malignant neoplasm of upper-outer quadrant of right female breast: Secondary | ICD-10-CM

## 2022-09-27 DIAGNOSIS — Z780 Asymptomatic menopausal state: Secondary | ICD-10-CM | POA: Diagnosis not present

## 2022-09-28 ENCOUNTER — Encounter: Payer: Self-pay | Admitting: Cardiology

## 2022-09-29 ENCOUNTER — Encounter: Payer: Self-pay | Admitting: Oncology

## 2022-09-29 ENCOUNTER — Inpatient Hospital Stay: Payer: Commercial Managed Care - PPO | Attending: Hematology and Oncology | Admitting: Hematology and Oncology

## 2022-09-29 ENCOUNTER — Other Ambulatory Visit (HOSPITAL_COMMUNITY): Payer: Self-pay

## 2022-09-29 DIAGNOSIS — Z170 Estrogen receptor positive status [ER+]: Secondary | ICD-10-CM

## 2022-09-29 DIAGNOSIS — C50411 Malignant neoplasm of upper-outer quadrant of right female breast: Secondary | ICD-10-CM | POA: Diagnosis not present

## 2022-09-29 MED ORDER — OSPHENA 60 MG PO TABS
60.0000 mg | ORAL_TABLET | Freq: Every day | ORAL | 3 refills | Status: DC
  Filled 2022-09-29 – 2022-09-30 (×2): qty 30, 30d supply, fill #0
  Filled 2022-10-26 – 2022-11-01 (×2): qty 30, 30d supply, fill #1
  Filled 2022-12-20: qty 30, 30d supply, fill #2
  Filled 2023-01-25: qty 30, 30d supply, fill #3

## 2022-09-29 NOTE — Progress Notes (Signed)
HEMATOLOGY-ONCOLOGY TELEPHONE VISIT PROGRESS NOTE  I connected with our patient on 09/29/22 at  8:30 AM EST by telephone and verified that I am speaking with the correct person using two identifiers.  I discussed the limitations, risks, security and privacy concerns of performing an evaluation and management service by telephone and the availability of in person appointments.  I also discussed with the patient that there may be a patient responsible charge related to this service. The patient expressed understanding and agreed to proceed.   History of Present Illness: Telephone visit to discuss the role of Osphena in reducing vaginal dryness  Oncology History  Malignant neoplasm of upper-outer quadrant of right breast in female, estrogen receptor positive ([place])  02/11/2017 Initial Diagnosis   Malignant neoplasm of upper-outer quadrant of right breast in female, estrogen receptor positive ([place])   02/21/2017 Genetic Testing   Negative genetic testing on the STAT panel through [facility].  The STAT gene panel offered by [facility] and includes sequencing and rearrangement analysis for the following 9 genes: ATM, BRCA1, BRCA2, CDH1, CHEK2, PALB2, PTEN, STK11, and TP53.  The report date is February 19, 2017.  We re-requisitioned for a larger Multi gene panel, which was also negative.  The Multi-Gene Panel offered by [facility] includes sequencing and/or deletion duplication testing of the following 80 genes: ALK, APC, ATM, AXIN2,BAP1,  BARD1, BLM, BMPR1A, BRCA1, BRCA2, BRIP1, CASR, CDC73, CDH1, CDK4, CDKN1B, CDKN1C, CDKN2A (p14ARF), CDKN2A (p16INK4a), CEBPA, CHEK2, CTNNA1, DICER1, DIS3L2, EGFR (c.2369C>T, p.Thr790Met variant only), EPCAM (Deletion/duplication testing only), FH, FLCN, GATA2, GPC3, GREM1 (Promoter region deletion/duplication testing only), HOXB13 (c.251G>A, p.Gly84Glu), HRAS, KIT, MAX, MEN1, MET, MITF (c.952G>A, p.Glu318Lys variant only), MLH1, MSH2, MSH3, MSH6, MUTYH, NBN, NF1, NF2,  NTHL1, PALB2, PDGFRA, PHOX2B, PMS2, POLD1, POLE, POT1, PRKAR1A, PTCH1, PTEN, RAD50, RAD51C, RAD51D, RB1, RECQL4, RET, RUNX1, SDHAF2, SDHA (sequence changes only), SDHB, SDHC, SDHD, SMAD4, SMARCA4, SMARCB1, SMARCE1, STK11, SUFU, TERT, TERT, TMEM127, TP53, TSC1, TSC2, VHL, WRN and WT1.  The report date is February 21, 2017.      REVIEW OF SYSTEMS:   Constitutional: Denies fevers, chills or abnormal weight loss All other systems were reviewed with the patient and are negative. Observations/Objective:     Assessment Plan:  Malignant neoplasm of upper-outer quadrant of right breast in female, estrogen receptor positive ([place]) 02/08/17: T2 N0, stage IB IDC, grade 2, ER/PR positive, HER-2 nonamplified, with a Ki67-1 of 5% Oncotype 11 (9% ROR) 02/28/2017: Bilateral mastectomies: Left: Intraductal papilloma, right: T2N1 stage IIa grade 1 IDC (implants got infected and removed), MammaPrint: Low risk 02/11/2017: Tamoxifen started neoadjuvantly and held during chemo 02/19/2017: Genetics: Negative 04/15/2017-08/03/2017: Adjuvant chemo with Taxotere and Cytoxan every 3 weeks x4 at [facility] 09/30/2017: Completed radiation 10/25/2017: Anastrozole with goserelin until August 2019, underwent hysterectomy with BSO 06/29/2018 (plan is for 7 years)   Anastrozole toxicities: Severe vaginal dryness: She had done [facility] touch and tells me it has not helped her.  She might consider going back to do it again. Weight gain secondary to anastrozole: She is on Ozempic and has lost all the weight. Decreased sexual desire: Patient wants to try Osphena Musculoskeletal aches and pains  Vaginal dryness: Patient would like to consider Osphena. She understands that it has a risk of blood clots I sent a prescription to [facility].  I discussed the assessment and treatment plan with the patient. The patient was provided an opportunity to ask questions and all were answered. The patient agreed with the plan and  demonstrated  an understanding of the instructions. The patient was advised to call back or seek an in-person evaluation if the symptoms worsen or if the condition fails to improve as anticipated.   I provided 12 minutes of non-face-to-face time during this encounter.  This includes time for charting and coordination of care   Harriette Ohara, MD

## 2022-09-29 NOTE — Assessment & Plan Note (Signed)
02/08/17: T2 N0, stage IB IDC, grade 2, ER/PR positive, HER-2 nonamplified, with a Ki67-1 of 5% Oncotype 11 (9% ROR) 02/28/2017: Bilateral mastectomies: Left: Intraductal papilloma, right: T2N1 stage IIa grade 1 IDC (implants got infected and removed), MammaPrint: Low risk 02/11/2017: Tamoxifen started neoadjuvantly and held during chemo 02/19/2017: Genetics: Negative 04/15/2017-08/03/2017: Adjuvant chemo with Taxotere and Cytoxan every 3 weeks x4 at [facility] 09/30/2017: Completed radiation 10/25/2017: Anastrozole with goserelin until August 2019, underwent hysterectomy with BSO 06/29/2018 (plan is for 7 years)   Anastrozole toxicities: Severe vaginal dryness: She had done [facility] touch and tells me it has not helped her.  She might consider going back to do it again. Weight gain secondary to anastrozole: She is on Ozempic and has lost all the weight. Decreased sexual desire: Patient wants to try Osphena Musculoskeletal aches and pains  Vaginal dryness: Patient would like to consider Osphena. She understands that it has a risk of blood clot. She will try it again.

## 2022-09-30 ENCOUNTER — Telehealth: Payer: Self-pay

## 2022-09-30 ENCOUNTER — Other Ambulatory Visit (HOSPITAL_COMMUNITY): Payer: Self-pay

## 2022-09-30 NOTE — Telephone Encounter (Signed)
Notified Patient of prior authorization approval for Osphena '60mg'$  tablets. Medication is approved from 09/29/2022 through 09/28/2023. No other needs or concerns voiced at this time.

## 2022-10-01 ENCOUNTER — Encounter: Payer: Self-pay | Admitting: Oncology

## 2022-10-01 ENCOUNTER — Other Ambulatory Visit (HOSPITAL_COMMUNITY): Payer: Self-pay

## 2022-10-07 ENCOUNTER — Encounter: Payer: Self-pay | Admitting: *Deleted

## 2022-10-07 ENCOUNTER — Encounter: Payer: Self-pay | Admitting: Oncology

## 2022-10-08 ENCOUNTER — Other Ambulatory Visit (HOSPITAL_COMMUNITY): Payer: Self-pay

## 2022-10-08 DIAGNOSIS — L700 Acne vulgaris: Secondary | ICD-10-CM | POA: Diagnosis not present

## 2022-10-08 DIAGNOSIS — Z79899 Other long term (current) drug therapy: Secondary | ICD-10-CM | POA: Diagnosis not present

## 2022-10-08 MED ORDER — ISOTRETINOIN 40 MG PO CAPS
40.0000 mg | ORAL_CAPSULE | Freq: Every day | ORAL | 0 refills | Status: DC
  Filled 2022-10-08: qty 30, 30d supply, fill #0

## 2022-10-09 ENCOUNTER — Encounter: Payer: Self-pay | Admitting: Oncology

## 2022-10-11 ENCOUNTER — Other Ambulatory Visit (HOSPITAL_COMMUNITY): Payer: Self-pay

## 2022-10-26 ENCOUNTER — Other Ambulatory Visit: Payer: Self-pay | Admitting: Hematology and Oncology

## 2022-10-26 ENCOUNTER — Other Ambulatory Visit (HOSPITAL_COMMUNITY): Payer: Self-pay

## 2022-10-26 MED ORDER — IBANDRONATE SODIUM 150 MG PO TABS
ORAL_TABLET | ORAL | 4 refills | Status: DC
  Filled 2022-10-26 – 2022-10-28 (×2): qty 3, 84d supply, fill #0
  Filled 2023-01-03: qty 3, 84d supply, fill #1
  Filled 2023-04-26: qty 3, 84d supply, fill #2
  Filled 2023-07-20: qty 3, 84d supply, fill #3
  Filled 2023-10-10: qty 3, 84d supply, fill #4

## 2022-10-27 ENCOUNTER — Other Ambulatory Visit (HOSPITAL_COMMUNITY): Payer: Self-pay

## 2022-10-28 ENCOUNTER — Other Ambulatory Visit (HOSPITAL_COMMUNITY): Payer: Self-pay

## 2022-11-01 ENCOUNTER — Other Ambulatory Visit (HOSPITAL_COMMUNITY): Payer: Self-pay

## 2022-11-02 ENCOUNTER — Other Ambulatory Visit (HOSPITAL_COMMUNITY): Payer: Self-pay

## 2022-11-03 ENCOUNTER — Other Ambulatory Visit (HOSPITAL_COMMUNITY): Payer: Self-pay

## 2022-11-04 ENCOUNTER — Other Ambulatory Visit (HOSPITAL_COMMUNITY): Payer: Self-pay

## 2022-11-10 ENCOUNTER — Encounter: Payer: Self-pay | Admitting: Oncology

## 2022-11-15 DIAGNOSIS — Z170 Estrogen receptor positive status [ER+]: Secondary | ICD-10-CM | POA: Diagnosis not present

## 2022-11-15 DIAGNOSIS — Z9013 Acquired absence of bilateral breasts and nipples: Secondary | ICD-10-CM | POA: Diagnosis not present

## 2022-11-15 DIAGNOSIS — C50411 Malignant neoplasm of upper-outer quadrant of right female breast: Secondary | ICD-10-CM | POA: Diagnosis not present

## 2022-11-16 ENCOUNTER — Other Ambulatory Visit (HOSPITAL_COMMUNITY): Payer: Self-pay

## 2022-11-16 DIAGNOSIS — L308 Other specified dermatitis: Secondary | ICD-10-CM | POA: Diagnosis not present

## 2022-11-16 DIAGNOSIS — L700 Acne vulgaris: Secondary | ICD-10-CM | POA: Diagnosis not present

## 2022-11-16 DIAGNOSIS — Z79899 Other long term (current) drug therapy: Secondary | ICD-10-CM | POA: Diagnosis not present

## 2022-11-16 MED ORDER — TAZAROTENE 0.1 % EX CREA
TOPICAL_CREAM | Freq: Every day | CUTANEOUS | 11 refills | Status: DC
  Filled 2022-11-16: qty 30, 30d supply, fill #0

## 2022-11-17 ENCOUNTER — Other Ambulatory Visit (HOSPITAL_COMMUNITY): Payer: Self-pay

## 2022-11-22 ENCOUNTER — Other Ambulatory Visit (HOSPITAL_COMMUNITY): Payer: Self-pay

## 2022-11-24 ENCOUNTER — Encounter: Payer: Self-pay | Admitting: Cardiology

## 2022-11-24 ENCOUNTER — Other Ambulatory Visit (HOSPITAL_COMMUNITY): Payer: Self-pay

## 2022-11-24 ENCOUNTER — Telehealth: Payer: Self-pay | Admitting: Pharmacist

## 2022-11-24 NOTE — Telephone Encounter (Signed)
Fax from pharmacy received for [facility] PA. Pt on Wegovy since 07/06/21. Baseline weight 140 lbs on 07/06/21, current weight 115.7 lbs on 09/10/22. Reflects 17% weight loss, > 5% needed for insurance reauthorization.  PA submitted, Key: [id number]

## 2022-11-24 NOTE — Telephone Encounter (Signed)
Duplicate encounter, responded to pt in other mychart message.

## 2022-11-24 NOTE — Telephone Encounter (Signed)
Prior auth approved through 11/24/23, pt made aware.

## 2022-11-25 ENCOUNTER — Other Ambulatory Visit (HOSPITAL_COMMUNITY): Payer: Self-pay

## 2022-11-26 ENCOUNTER — Other Ambulatory Visit (HOSPITAL_COMMUNITY): Payer: Self-pay

## 2022-12-10 ENCOUNTER — Other Ambulatory Visit (HOSPITAL_COMMUNITY): Payer: Self-pay

## 2022-12-20 ENCOUNTER — Encounter: Payer: Self-pay | Admitting: Family Medicine

## 2022-12-20 ENCOUNTER — Ambulatory Visit (INDEPENDENT_AMBULATORY_CARE_PROVIDER_SITE_OTHER): Payer: Commercial Managed Care - PPO | Admitting: Family Medicine

## 2022-12-20 ENCOUNTER — Other Ambulatory Visit (HOSPITAL_COMMUNITY): Payer: Self-pay

## 2022-12-20 VITALS — BP 102/70 | HR 79 | Temp 98.2°F | Ht 60.0 in | Wt 120.4 lb

## 2022-12-20 DIAGNOSIS — F411 Generalized anxiety disorder: Secondary | ICD-10-CM | POA: Diagnosis not present

## 2022-12-20 DIAGNOSIS — E7849 Other hyperlipidemia: Secondary | ICD-10-CM

## 2022-12-20 DIAGNOSIS — Z0000 Encounter for general adult medical examination without abnormal findings: Secondary | ICD-10-CM

## 2022-12-20 DIAGNOSIS — M8588 Other specified disorders of bone density and structure, other site: Secondary | ICD-10-CM

## 2022-12-20 DIAGNOSIS — Z23 Encounter for immunization: Secondary | ICD-10-CM | POA: Diagnosis not present

## 2022-12-20 NOTE — Progress Notes (Signed)
Phone [phone]   Subjective:  Patient presents today for their annual physical. Chief complaint-noted.   See problem oriented charting- ROS- full  review of systems was completed and negative except for: anxiety/stress  The following were reviewed and entered/updated in epic: Past Medical History:  Diagnosis Date   Allergy    Anxiety    venflafaxine XR 75 mg   Breast cancer ([place]) 01/2017   right; genetic testing negative in 2018 with [employer] panel   Heart murmur    as child- went away   History of MRSA infection    elbow   Hyperlipidemia    Infection of breast implant ([place]) 01/19/2021   Joint pain    due to medicine induced joint pain   Migraine    otc med prn   Miscarriage    x1   MRSA infection 01/28/2021   Nausea and vomiting 01/19/2021   Neuromuscular disorder ([place])    neuropathy bilater feet - ? 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[place], estrogen receptor positive ([place]) 02/11/2017    Priority: High   Hyperlipidemia, familial, high LDL 01/16/2010    Priority: High   Osteopenia of lumbar spine 05/28/2020    Priority: Medium    Aromatase inhibitor-associated arthralgia 02/24/2018    Priority: Medium    Enchondroma of bone 02/24/2018    Priority: Medium    GAD (generalized anxiety disorder)     Priority: Medium    Family history of diabetes mellitus 11/24/2021    Priority: Low   MRSA infection 01/28/2021    Priority: Low   Cellulitis of breast 12/06/2019    Priority: Low   History of total hysterectomy 06/29/2018    Priority: Low   Family history of colonic polyps 02/24/2018    Priority: Low   Genetic testing 02/21/2017    Priority: Low   Family history of stomach cancer     Priority: Low   Lesion of lung 04/05/2018   Past Surgical History:  Procedure Laterality Date   AREOLA/NIPPLE RECONSTRUCTION WITH GRAFT  Right 12/01/2018   Procedure: RIGHT NIPPLE AREOLA COMPLEX RECONSTRUCTION WITH FULL THICKNESS SKIN GRAFT FROM RIGHT GROIN;  Surgeon: Irene Limbo, MD;  Location: [facility];  Service: Plastics;  Laterality: Right;   BREAST IMPLANT REMOVAL Right 01/20/2021   Procedure: REMOVAL RIGHT BREAST IMPLANT;  Surgeon: Irene Limbo, MD;  Location: [facility];  Service: Plastics;  Laterality: Right;   BREAST IMPLANT REMOVAL Left 03/02/2021   Procedure: REMOVAL LEFT CHEST IMPLANT AND CAPSULECTOMY;  Surgeon: Irene Limbo, MD;  Location: [facility];  Service: Plastics;  Laterality: Left;   BREAST RECONSTRUCTION Left 12/01/2018   Procedure: LEFT BREAST REVISION RECONSTRUCTION WITH SKIN EXCISION;  Surgeon: Irene Limbo, MD;  Location: [facility];  Service: Plastics;  Laterality: Left;   BREAST RECONSTRUCTION Left 12/01/2018   Procedure: COMPOSITE GRAFT FROM LEFT NIPPLE (NIPPLE SHARING);  Surgeon: Irene Limbo, MD;  Location: [facility];  Service: Plastics;  Laterality: Left;   BREAST RECONSTRUCTION WITH PLACEMENT OF TISSUE EXPANDER AND FLEX HD (ACELLULAR HYDRATED DERMIS) Bilateral 02/28/2017   Procedure: BILATERAL BREAST RECONSTRUCTION WITH PLACEMENT OF TISSUE EXPANDER AND ALLODERM;  Surgeon: Irene Limbo, MD;  Location: [facility];  Service: Plastics;  Laterality: Bilateral;   CESAREAN SECTION  2008,2010,2013   x 3  CESAREAN SECTION  02/04/2012   Procedure: CESAREAN SECTION;  Surgeon: Cyril Mourning, MD;  Location: [facility];  Service: Gynecology;  Laterality: N/A;  Repeat Cesarean Section Delivery  Boy  @  [phone], Apgars 9/10   COLONOSCOPY     polyp   DILATION AND CURETTAGE OF UTERUS     EXCISION OF BREAST LESION Right 03/15/2017   Procedure: RIGHT NIPPLE AND AREOLA EXCISION;  Surgeon: Rolm Bookbinder, MD;  Location: [facility];  Service: General;  Laterality: Right;   LAPAROSCOPIC VAGINAL HYSTERECTOMY WITH  SALPINGO OOPHORECTOMY Bilateral 06/29/2018   Procedure: LAPAROSCOPIC ASSISTED VAGINAL HYSTERECTOMY WITH SALPINGO OOPHORECTOMY;  Surgeon: Louretta Shorten, MD;  Location: [facility];  Service: Gynecology;  Laterality: Bilateral;  Request case for [facility] CRNA   LIPOSUCTION WITH LIPOFILLING N/A 07/25/2018   Procedure: LIPOSUCTION WITH LIPOFILLING FROM ABDOMEN TO CHEST;  Surgeon: Irene Limbo, MD;  Location: [facility];  Service: Plastics;  Laterality: N/A;   LIPOSUCTION WITH LIPOFILLING Bilateral 12/01/2018   Procedure: LIPOFILLING TO BILATERAL CHEST REVISION;  Surgeon: Irene Limbo, MD;  Location: [facility];  Service: Plastics;  Laterality: Bilateral;   NIPPLE SPARING MASTECTOMY Bilateral 02/28/2017   Procedure: RIGHT NIPPLE SPARING MASTECTOMY; LEFT PROPHYLACTIC NIPPLE SPARING MASTECTOMY;  Surgeon: Rolm Bookbinder, MD;  Location: [facility];  Service: General;  Laterality: Bilateral;   PECTUS EXCAVATUM REPAIR  [id number]   47 years old- very large implant   REMOVAL OF TISSUE EXPANDER AND PLACEMENT OF IMPLANT Bilateral 07/25/2018   Procedure: REMOVAL OF TISSUE EXPANDER AND PLACEMENT OF SILICONE IMPLANT;  Surgeon: Irene Limbo, MD;  Location: [facility];  Service: Plastics;  Laterality: Bilateral;   SENTINEL NODE BIOPSY Right 02/28/2017   Procedure: RIGHT AXILLARY SENTINEL LYMPH  NODE BIOPSY;  Surgeon: Rolm Bookbinder, MD;  Location: [facility];  Service: General;  Laterality: Right;   VULVAR LESION REMOVAL     WISDOM TOOTH EXTRACTION      Family History  Problem Relation Age of Onset   Skin cancer Mother    Hyperlipidemia Mother    Hypertension Mother    Colon polyps Mother    Hyperlipidemia Father    Parkinson's disease Sister 12       early on set   77 / Stillbirths Sister    Diabetes Maternal Aunt    Colon polyps Maternal Uncle    Heart failure Maternal Grandmother    Stroke Maternal Grandmother     Heart attack Maternal Grandfather    Depression Maternal Grandfather    Early death Maternal Grandfather        73 MI   Stomach cancer Paternal Grandmother    Prostate cancer Paternal Grandfather    Alzheimer's disease Paternal Grandfather    Breast cancer Cousin        mother's paternal first [place] cousin   Colon polyps Sister    Colon cancer Neg Hx    Rectal cancer Neg Hx     Medications- reviewed and updated Current Outpatient Medications  Medication Sig Dispense Refill   buPROPion (WELLBUTRIN XL) 150 MG 24 hr tablet Take 1 tablet (150 mg total) by mouth daily. 90 tablet 3   Calcium Carbonate (CALCIUM 600 PO) Take 1 capsule by mouth daily.     Evolocumab (REPATHA SURECLICK) [id number] MG/ML SOAJ Inject 140 mg (1 pen) into the skin every 14 (fourteen) days. 2 mL 11   exemestane (AROMASIN) 25 MG tablet Take 1 tablet (25 mg total) by mouth daily  after breakfast. 90 tablet 3   ibandronate (BONIVA) 150 MG tablet Take in the morning every 28 days; take with a full glass of water, on an empty stomach, and do not take anything else by mouth or lie down for the next 60 min. 4 tablet 4   ISOtretinoin (ACCUTANE) 40 MG capsule Take 1 capsule (40 mg total) by mouth daily with a fatty meal (Patient not taking: Reported on 12/20/2022) 30 capsule 0   Multiple Vitamins-Minerals (MULTIVITAMIN ADULT EXTRA C PO) Take 1 capsule by mouth daily.     Multiple Vitamins-Minerals (MULTIVITAMIN WOMEN 50+) TABS Take 1 tablet by mouth daily.     Ospemifene (OSPHENA) 60 MG TABS Take 1 tablet (60 mg total) by mouth daily. 30 tablet 3   Semaglutide-Weight Management (WEGOVY) 2.4 MG/0.75ML SOAJ Inject 2.4 mg into the skin once a week. 3 mL 11   tazarotene (AVAGE) 0.1 % cream Apply a pea size amount to face topically at night as tolerated 30 g 11   No current facility-administered medications for this visit.    Allergies-reviewed and updated Allergies  Allergen Reactions   Fentanyl Itching    Severe itching - Patient  refuses this medication   Peach Flavor Anaphylaxis   Penicillins Hives    AS A CHILD   Sulfa Antibiotics Nausea And Vomiting   Sulfasalazine Nausea And Vomiting and Other (See Comments)   Latex Itching    Social History   Social History Narrative   Married to Dr. Aundra Dubin. 3 children.    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    Masters in public history at [employer]. Undergrad at [employer]: running, reading, exercise   Objective  Objective:  BP 102/70   Pulse Katelyn   Temp 98.2 F (36.8 C)   Ht 5' (1.524 m)   Wt 120 lb 6.4 oz (54.6 kg)   LMP 09/01/2017   SpO2 100%   BMI 23.51 kg/m  Gen: NAD, resting comfortably HEENT: Mucous membranes are moist. Oropharynx normal Neck: no thyromegaly CV: RRR no murmurs rubs or gallops Lungs: CTAB no crackles, wheeze, rhonchi Abdomen: soft/nontender/nondistended/normal bowel sounds. No rebound or guarding.  Ext: no edema Skin: warm, dry Neuro: grossly normal, moves all extremities, PERRLA   Assessment and Plan   47 y.o. [place] presenting for annual physical.  Health Maintenance counseling: 1. Anticipatory guidance: Patient counseled regarding regular dental exams -q6 months, eye exams - yearly,  avoiding smoking and second hand smoke , limiting alcohol to 1 beverage per day, no illicit drugs .   2. Risk factor reduction:  Advised patient of need for regular exercise and diet rich and fruits and vegetables to reduce risk of heart attack and stroke.  Exercise- weight training once a week with professional- plus walks regularly. No chest pain or shortness of breath with this Diet/weight management-long term wevovy planned through cardiology- weight stable from last year but having insurance concerns Wt Readings from Last 3 Encounters:  12/20/22 120 lb 6.4 oz (54.6 kg)  09/10/22 115 lb 11.2 oz (52.5 kg)  06/11/22 117 lb (53.1 kg)  3. Immunizations/screenings/ancillary studies- up to date after  Tdap today Immunization History  Administered Date(s) Administered   Covid-19, Mrna,Vaccine(Spikevax)42yr and older 08/13/2022   Influenza,inj,Quad PF,6+ Mos 09/17/2015, 09/16/2016, 06/21/2019, 08/08/2021, 08/13/2022   Influenza-Unspecified 07/03/2020   Moderna Covid-19 Vaccine Bivalent Booster 147yr& up 07/09/2021   Moderna Sars-Covid-2 Vaccination  11/09/2019, 12/15/2019, 07/03/2020   Tdap 12/20/2022  4. Cervical cancer screening-  history of total hysterectomy including cervix per patient for benign reasons-has been told Pap smears not required.  She still follows with Dr. Corinna Capra if needed- no recent issues- and did have Pap smear April 2018 prior to hysterectomy- mona lisa touch was not helpful.   5. Breast cancer follow up-  has regular follow up with oncology Dr. Lindi Adie- last visit 09/29/22- 7 years anastrozole planned but now on aromasin-breast cancer stable/appropriately treated.   Breast reconstruction planned 12/22/22 6. Colon cancer screening - 03/09/18 with 5 year repeat due to family history  so due in a few months-she is already trying to schedule 7. Skin cancer screening- Dr. Elvera Lennox visit next week. Did get treated for rosacea and helpful. advised regular sunscreen use. Denies worrisome, changing, or new skin lesions.  8. Birth control/STD check- only active with husband and hysterectomy 9. Osteoporosis screening at 83- still on bonvia through oncology 09/27/22- very mild osteopenia with worst t score -1.2. taking calcium and vitmain D 10. Smoking associated screening - never smoker  Status of chronic or acute concerns   #Surgery 12/22/22 with [facility]- some concernabout recovery with upcoming [place] in late march. Able to complete 4 mets without chest pain or shortness of breath    #vaginal dryness- trial osphena through oncology and to help with desire  #weight gain due to anastrozole- on wevogy but some gaps- helpful  #hyperlipidemia- familial S: Medication:repatha 40 mg q2  weeks  Lab Results  Component Value Date   CHOL 99 09/10/2022   HDL 41 09/10/2022   LDLCALC 39 09/10/2022   TRIG 95 09/10/2022   CHOLHDL 2.4 09/10/2022   A/P: stable- continue current medicines   # Depression/GAD S: Medication: wellbutrin '150mg'$  XR- gears her up some (but helps with pain). In past on venlafaxine. Tried ambien 2.5 mg for sleep last year- not currently on    12/20/2022   10:27 AM 12/08/2021    1:36 PM 02/03/2021    8:53 AM  Depression screen PHQ 2/9  Decreased Interest 0 0 0  Down, Depressed, Hopeless 0 1 1  PHQ - 2 Score 0 1 1  Altered sleeping 1 3   Tired, decreased energy 1 3   Change in appetite 0 0   Feeling bad or failure about yourself  1 0   Trouble concentrating 0 0   Moving slowly or fidgety/restless 0 0   Suicidal thoughts 0 0   PHQ-9 Score 3 7   Difficult doing work/chores Not difficult at all Not difficult at all       12/20/2022   10:27 AM 12/08/2021    2:51 PM 05/16/2018   11:59 AM  GAD 7 : Generalized Anxiety Score  Nervous, Anxious, on Edge '2 3 1  '$ Control/stop worrying '1 3 1  '$ Worry too much - different things '1 2 2  '$ Trouble relaxing 0 0 0  Restless 0 0 0  Easily annoyed or irritable 0 1 0  Afraid - awful might happen '1 1 1  '$ Total GAD 7 Score '5 10 5  '$ Anxiety Difficulty Not difficult at all Somewhat difficult Not difficult at all  A/P: depression and anxiety reasonably well controlled. Takes at night and seems to work better for her- feels benefits outweighs the risks  Recommended follow up: Return in about 1 year (around 12/21/2023) for physical or sooner if needed.Schedule b4 you leave. Future Appointments  Date Time Provider [place]  03/10/2023  10:00 AM [facility] None  03/10/2023 10:30 AM Nicholas Lose, MD [facility] None    Lab/Order associations: fasting but does not need bloodwork today   ICD-10-CM   1. Preventative health care  Z00.00     2. Need for Tdap vaccination  Z23 Tdap vaccine greater than or  equal to 7yo IM    3. Hyperlipidemia, familial, high LDL  E78.49     4. GAD (generalized anxiety disorder)  F41.1     5. Osteopenia of lumbar spine  M85.88       No orders of the defined types were placed in this encounter.   Return precautions advised.  Garret Reddish, MD

## 2022-12-20 NOTE — Patient Instructions (Addendum)
Goal 150 minutes exercise per week  Hold off on labs- had everything about 3 months ago  Tdap today  Recommended follow up: Return in about 1 year (around 12/21/2023) for physical or sooner if needed.Schedule b4 you leave.

## 2022-12-22 ENCOUNTER — Other Ambulatory Visit (HOSPITAL_COMMUNITY): Payer: Self-pay

## 2022-12-22 DIAGNOSIS — L905 Scar conditions and fibrosis of skin: Secondary | ICD-10-CM | POA: Diagnosis not present

## 2022-12-22 DIAGNOSIS — L598 Other specified disorders of the skin and subcutaneous tissue related to radiation: Secondary | ICD-10-CM | POA: Diagnosis not present

## 2022-12-22 DIAGNOSIS — Z79811 Long term (current) use of aromatase inhibitors: Secondary | ICD-10-CM | POA: Diagnosis not present

## 2022-12-22 DIAGNOSIS — Z9013 Acquired absence of bilateral breasts and nipples: Secondary | ICD-10-CM | POA: Diagnosis not present

## 2022-12-22 DIAGNOSIS — Y838 Other surgical procedures as the cause of abnormal reaction of the patient, or of later complication, without mention of misadventure at the time of the procedure: Secondary | ICD-10-CM | POA: Diagnosis not present

## 2022-12-22 DIAGNOSIS — C50911 Malignant neoplasm of unspecified site of right female breast: Secondary | ICD-10-CM | POA: Diagnosis not present

## 2022-12-22 DIAGNOSIS — Z91040 Latex allergy status: Secondary | ICD-10-CM | POA: Diagnosis not present

## 2022-12-22 DIAGNOSIS — Z853 Personal history of malignant neoplasm of breast: Secondary | ICD-10-CM | POA: Diagnosis not present

## 2022-12-22 DIAGNOSIS — Y813 Surgical instruments, materials and general- and plastic-surgery devices (including sutures) associated with adverse incidents: Secondary | ICD-10-CM | POA: Diagnosis not present

## 2022-12-22 DIAGNOSIS — Z9221 Personal history of antineoplastic chemotherapy: Secondary | ICD-10-CM | POA: Diagnosis not present

## 2022-12-22 DIAGNOSIS — Z87768 Personal history of other specified (corrected) congenital malformations of integument, limbs and musculoskeletal system: Secondary | ICD-10-CM | POA: Diagnosis not present

## 2022-12-22 DIAGNOSIS — C50411 Malignant neoplasm of upper-outer quadrant of right female breast: Secondary | ICD-10-CM | POA: Diagnosis not present

## 2022-12-22 DIAGNOSIS — Z923 Personal history of irradiation: Secondary | ICD-10-CM | POA: Diagnosis not present

## 2022-12-22 DIAGNOSIS — L7611 Accidental puncture and laceration of skin and subcutaneous tissue during a dermatologic procedure: Secondary | ICD-10-CM | POA: Diagnosis not present

## 2022-12-22 DIAGNOSIS — Z170 Estrogen receptor positive status [ER+]: Secondary | ICD-10-CM | POA: Diagnosis not present

## 2022-12-22 DIAGNOSIS — N651 Disproportion of reconstructed breast: Secondary | ICD-10-CM | POA: Diagnosis not present

## 2022-12-22 DIAGNOSIS — Z885 Allergy status to narcotic agent status: Secondary | ICD-10-CM | POA: Diagnosis not present

## 2022-12-22 DIAGNOSIS — Z79810 Long term (current) use of selective estrogen receptor modulators (SERMs): Secondary | ICD-10-CM | POA: Diagnosis not present

## 2022-12-22 MED ORDER — TRAMADOL HCL 50 MG PO TABS
50.0000 mg | ORAL_TABLET | Freq: Four times a day (QID) | ORAL | 0 refills | Status: DC | PRN
  Filled 2022-12-22: qty 10, 3d supply, fill #0

## 2022-12-29 ENCOUNTER — Other Ambulatory Visit (HOSPITAL_COMMUNITY): Payer: Self-pay

## 2022-12-30 DIAGNOSIS — L700 Acne vulgaris: Secondary | ICD-10-CM | POA: Diagnosis not present

## 2022-12-30 DIAGNOSIS — D2262 Melanocytic nevi of left upper limb, including shoulder: Secondary | ICD-10-CM | POA: Diagnosis not present

## 2022-12-30 DIAGNOSIS — D2272 Melanocytic nevi of left lower limb, including hip: Secondary | ICD-10-CM | POA: Diagnosis not present

## 2022-12-30 DIAGNOSIS — D2261 Melanocytic nevi of right upper limb, including shoulder: Secondary | ICD-10-CM | POA: Diagnosis not present

## 2022-12-30 DIAGNOSIS — D225 Melanocytic nevi of trunk: Secondary | ICD-10-CM | POA: Diagnosis not present

## 2022-12-30 DIAGNOSIS — D2271 Melanocytic nevi of right lower limb, including hip: Secondary | ICD-10-CM | POA: Diagnosis not present

## 2022-12-30 DIAGNOSIS — D692 Other nonthrombocytopenic purpura: Secondary | ICD-10-CM | POA: Diagnosis not present

## 2023-01-03 ENCOUNTER — Encounter: Payer: Self-pay | Admitting: Cardiology

## 2023-01-03 ENCOUNTER — Other Ambulatory Visit (HOSPITAL_COMMUNITY): Payer: Self-pay

## 2023-01-03 DIAGNOSIS — Z170 Estrogen receptor positive status [ER+]: Secondary | ICD-10-CM | POA: Diagnosis not present

## 2023-01-03 DIAGNOSIS — Z9013 Acquired absence of bilateral breasts and nipples: Secondary | ICD-10-CM | POA: Diagnosis not present

## 2023-01-03 DIAGNOSIS — C50411 Malignant neoplasm of upper-outer quadrant of right female breast: Secondary | ICD-10-CM | POA: Diagnosis not present

## 2023-01-03 MED ORDER — ZEPBOUND 10 MG/0.5ML ~~LOC~~ SOAJ
10.0000 mg | SUBCUTANEOUS | 0 refills | Status: DC
  Filled 2023-01-03 – 2023-03-08 (×2): qty 2, 28d supply, fill #0

## 2023-01-05 ENCOUNTER — Other Ambulatory Visit (HOSPITAL_COMMUNITY): Payer: Self-pay

## 2023-01-07 ENCOUNTER — Other Ambulatory Visit (HOSPITAL_COMMUNITY): Payer: Self-pay

## 2023-01-11 ENCOUNTER — Other Ambulatory Visit (HOSPITAL_COMMUNITY): Payer: Self-pay

## 2023-01-12 ENCOUNTER — Encounter: Payer: Self-pay | Admitting: Oncology

## 2023-01-12 ENCOUNTER — Other Ambulatory Visit (HOSPITAL_COMMUNITY): Payer: Self-pay

## 2023-01-25 ENCOUNTER — Other Ambulatory Visit: Payer: Self-pay

## 2023-01-25 ENCOUNTER — Other Ambulatory Visit (HOSPITAL_COMMUNITY): Payer: Self-pay

## 2023-01-25 ENCOUNTER — Encounter: Payer: Self-pay | Admitting: Oncology

## 2023-02-07 ENCOUNTER — Encounter: Payer: Self-pay | Admitting: Hematology and Oncology

## 2023-02-07 DIAGNOSIS — Z9013 Acquired absence of bilateral breasts and nipples: Secondary | ICD-10-CM | POA: Diagnosis not present

## 2023-02-07 DIAGNOSIS — Z170 Estrogen receptor positive status [ER+]: Secondary | ICD-10-CM | POA: Diagnosis not present

## 2023-02-07 DIAGNOSIS — C50411 Malignant neoplasm of upper-outer quadrant of right female breast: Secondary | ICD-10-CM | POA: Diagnosis not present

## 2023-02-08 ENCOUNTER — Encounter: Payer: Self-pay | Admitting: Oncology

## 2023-02-09 ENCOUNTER — Encounter: Payer: Self-pay | Admitting: Oncology

## 2023-02-10 ENCOUNTER — Other Ambulatory Visit: Payer: Self-pay

## 2023-02-10 ENCOUNTER — Other Ambulatory Visit (HOSPITAL_COMMUNITY): Payer: Self-pay

## 2023-02-11 ENCOUNTER — Other Ambulatory Visit: Payer: Self-pay

## 2023-02-11 ENCOUNTER — Encounter: Payer: Self-pay | Admitting: Adult Health

## 2023-02-11 ENCOUNTER — Inpatient Hospital Stay: Payer: Commercial Managed Care - PPO | Attending: Adult Health | Admitting: Adult Health

## 2023-02-11 VITALS — BP 97/59 | HR 76 | Temp 98.1°F | Resp 16 | Ht 60.0 in

## 2023-02-11 DIAGNOSIS — Z9013 Acquired absence of bilateral breasts and nipples: Secondary | ICD-10-CM | POA: Diagnosis not present

## 2023-02-11 DIAGNOSIS — E7800 Pure hypercholesterolemia, unspecified: Secondary | ICD-10-CM | POA: Diagnosis not present

## 2023-02-11 DIAGNOSIS — Z79811 Long term (current) use of aromatase inhibitors: Secondary | ICD-10-CM | POA: Diagnosis not present

## 2023-02-11 DIAGNOSIS — Z90710 Acquired absence of both cervix and uterus: Secondary | ICD-10-CM | POA: Insufficient documentation

## 2023-02-11 DIAGNOSIS — Z8042 Family history of malignant neoplasm of prostate: Secondary | ICD-10-CM | POA: Insufficient documentation

## 2023-02-11 DIAGNOSIS — Z803 Family history of malignant neoplasm of breast: Secondary | ICD-10-CM | POA: Diagnosis not present

## 2023-02-11 DIAGNOSIS — Z800 Family history of malignant neoplasm of digestive organs: Secondary | ICD-10-CM | POA: Insufficient documentation

## 2023-02-11 DIAGNOSIS — Z170 Estrogen receptor positive status [ER+]: Secondary | ICD-10-CM

## 2023-02-11 DIAGNOSIS — C50411 Malignant neoplasm of upper-outer quadrant of right female breast: Secondary | ICD-10-CM

## 2023-02-11 DIAGNOSIS — Z90722 Acquired absence of ovaries, bilateral: Secondary | ICD-10-CM | POA: Diagnosis not present

## 2023-02-11 DIAGNOSIS — M8588 Other specified disorders of bone density and structure, other site: Secondary | ICD-10-CM | POA: Diagnosis not present

## 2023-02-11 DIAGNOSIS — Z923 Personal history of irradiation: Secondary | ICD-10-CM | POA: Diagnosis not present

## 2023-02-11 DIAGNOSIS — Z808 Family history of malignant neoplasm of other organs or systems: Secondary | ICD-10-CM | POA: Diagnosis not present

## 2023-02-11 NOTE — Progress Notes (Signed)
[facility] Cancer Follow up:    Katelyn Majestic, MD [address] [place] [address]   DIAGNOSIS:  Cancer Staging  Malignant neoplasm of upper-outer quadrant of right breast in female, estrogen receptor positive Staging form: Breast, AJCC 8th Edition - Clinical: Stage IIA (cT2, cN1(sn), cM0, G2, ER+, PR+, HER2-) - Unsigned Method of lymph node assessment: Sentinel lymph node biopsy Histologic grading system: 3 grade system   SUMMARY OF ONCOLOGIC HISTORY: 47 y.o. Katelyn Shelton woman status post right breast upper outer quadrant biopsy 02/08/2017 for a clinical T2 N0, stage IB invasive ductal carcinoma, grade 2, estrogen and progesterone receptor positive, HER-2 nonamplified, with an MIB-1 of 5%.             (a) biopsy of upper outer quadrant calcifications in the left breast 02/09/2017 were benign   (1) Oncotype DX obtained from the biopsy sample showed a recurrence score of 11 predicting a 10 year risk of recurrence outside the breast of 9% if the patient's only systemic therapy is tamoxifen for 5 years (node positive report)   (2) bilateral mastectomies with right sentinel lymph node sampling 02/28/2017 showed             (a) on the left, intraductal papilloma, with no evidence of malignancy.             (b) on the right, a pT2 pN1 stage IIA invasive ductal carcinoma, grade 1, with close margins             (c) additional excision of the right nipple areolar area 03/15/2017 found no residual carcinoma             (d) status post bilateral definitive silicone implant placement 07/25/2018             (e) status post bilateral implant removal and 2022 secondary to infection             (F) not a candidate for DIEP reconstruction   (3) Mammaprint study on the final surgical sample came back low risk, predicting a five-year distant disease-free survival of 97.8% with hormone therapy alone              (3) tamoxifen started neoadjuvantly 02/11/2017, Stopped 04/11/2017  in preparation for chemotherapy   (4) genetics counseling 02/19/2017 showed no deleterious mutations in the STAT gene panel offered by [facility] including ATM, BRCA1, BRCA2, CDH1, CHEK2, PALB2, PTEN, STK11, and TP53.             (a) multi gene panel February 21, 2017 through the  Multi-Gene Panel offered by [facility] found no deleterious mutations in ALK, APC, ATM, AXIN2,BAP1,  BARD1, BLM, BMPR1A, BRCA1, BRCA2, BRIP1, CASR, CDC73, CDH1, CDK4, CDKN1B, CDKN1C, CDKN2A (p14ARF), CDKN2A (p16INK4a), CEBPA, CHEK2, CTNNA1, DICER1, DIS3L2, EGFR (c.2369C>T, p.Thr790Met variant only), EPCAM (Deletion/duplication testing only), FH, FLCN, GATA2, GPC3, GREM1 (Promoter region deletion/duplication testing only), HOXB13 (c.251G>A, p.Gly84Glu), HRAS, KIT, MAX, MEN1, MET, MITF (c.952G>A, p.Glu318Lys variant only), MLH1, MSH2, MSH3, MSH6, MUTYH, NBN, NF1, NF2, NTHL1, PALB2, PDGFRA, PHOX2B, PMS2, POLD1, POLE, POT1, PRKAR1A, PTCH1, PTEN, RAD50, RAD51C, RAD51D, RB1, RECQL4, RET, RUNX1, SDHAF2, SDHA (sequence changes only), SDHB, SDHC, SDHD, SMAD4, SMARCA4, SMARCB1, SMARCE1, STK11, SUFU, TERT, TERT, TMEM127, TP53, TSC1, TSC2, VHL, WRN and WT1.    (5) adjuvant chemotherapy consisting of cyclophosphamide and docetaxel given every 21 days 4 started 04/15/2017, completed August 03, 2017 at [facility]   (6) adjuvant radiation completed 09/30/2017   (7) anastrozole started 10/25/2017             (  a) ovarian suppression with monthly goserelin October 2018 through August 2019             (b) bone density on 11/25/2017 showed a T score of -2.0 osteopenic             (c) status post hysterectomy and bilateral salpingo-oophorectomy 06/29/2018, with benign pathology             (d) denosumab/Prolia started 12/30/2017, last dose 01/15/2021 as insurance withdrew approval             (e) repeat bone density 05/28/2020 shows a T score of -1.7 (improved)   (8) history of adrenal hyperplasia, 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   (9) significant  hypercholesterolemia   (10) CT scan for cardiac scoring 03/30/2018 showed clustered nodules in the inferior right upper lobe.  This was felt to be most likely inflammatory.               (a) repeat CT of the chest on 10/10/2018 shows subpleural nodular scarring in the anterolateral right upper and middle lobes, unchanged from prior, compatible with radiation changes.  No findings suspicious for recurrent or metastatic disease.    CURRENT THERAPY: Exemestane  INTERVAL HISTORY: Katelyn Shelton 47 y.o. female returns for valuation of an area over her right breast that has been painful to her.  She underwent fat grafting back in February at [place].  Since she has history of pectus excavatum and has an implant she was not eligible for any other breast reconstruction.  After surgery she went to [place].  She has noticed the increased pain in that area.  Of note her blood pressure was decreased today.  She denies any lightheadedness or dizziness today.  Katelyn Shelton shared with me today that she is anxious because her mother-in-law was recently diagnosed with metastatic breast cancer.   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  MEDICAL HISTORY: Past Medical History:  Diagnosis Date    Allergy    Anxiety    venflafaxine XR 75 mg   Breast cancer (HCC) 01/2017   right; genetic testing negative in 2018 with [facility] panel   Heart murmur    as child- went away   History of MRSA infection    elbow   Hyperlipidemia    Infection of breast implant (HCC) 01/19/2021   Joint pain    due to medicine induced joint pain   Migraine    otc med prn   Miscarriage    x1   MRSA infection 01/28/2021   Nausea and vomiting 01/19/2021   Neuromuscular disorder (HCC)    neuropathy bilater feet - ? r/t chemo   Osteopenia    UTI (urinary tract infection)     SURGICAL HISTORY: Past Surgical History:  Procedure Laterality Date   AREOLA/NIPPLE RECONSTRUCTION WITH GRAFT Right  12/01/2018   Procedure: RIGHT NIPPLE AREOLA COMPLEX RECONSTRUCTION WITH FULL THICKNESS SKIN GRAFT FROM RIGHT GROIN;  Surgeon: Glenna Fellows, MD;  Location: [facility];  Service: Plastics;  Laterality: Right;   BREAST IMPLANT REMOVAL Right 01/20/2021   Procedure: REMOVAL RIGHT BREAST IMPLANT;  Surgeon: Glenna Fellows, MD;  Location: [facility];  Service: Plastics;  Laterality: Right;   BREAST IMPLANT REMOVAL Left 03/02/2021   Procedure: REMOVAL LEFT CHEST IMPLANT AND CAPSULECTOMY;  Surgeon: Glenna Fellows, MD;  Location: [facility];  Service: Plastics;  Laterality: Left;   BREAST RECONSTRUCTION Left 12/01/2018   Procedure: LEFT BREAST REVISION RECONSTRUCTION WITH SKIN EXCISION;  Surgeon: Glenna Fellows, MD;  Location: [facility];  Service: Plastics;  Laterality: Left;   BREAST RECONSTRUCTION Left 12/01/2018   Procedure: COMPOSITE GRAFT FROM LEFT NIPPLE (NIPPLE SHARING);  Surgeon: Glenna Fellows, MD;  Location: [facility];  Service: Plastics;  Laterality: Left;   BREAST RECONSTRUCTION WITH PLACEMENT OF TISSUE EXPANDER AND FLEX HD (ACELLULAR HYDRATED DERMIS) Bilateral 02/28/2017   Procedure: BILATERAL BREAST RECONSTRUCTION WITH PLACEMENT OF TISSUE EXPANDER AND  ALLODERM;  Surgeon: Glenna Fellows, MD;  Location: [facility];  Service: Plastics;  Laterality: Bilateral;   CESAREAN SECTION  2008,2010,2013   x 3   CESAREAN SECTION  02/04/2012   Procedure: CESAREAN SECTION;  Surgeon: Jeani Hawking, MD;  Location: [facility];  Service: Gynecology;  Laterality: N/A;  Repeat Cesarean Section Delivery  Boy  @  [phone], Apgars 9/10   COLONOSCOPY     polyp   DILATION AND CURETTAGE OF UTERUS     EXCISION OF BREAST LESION Right 03/15/2017   Procedure: RIGHT NIPPLE AND AREOLA EXCISION;  Surgeon: Emelia Loron, MD;  Location: [facility];  Service: General;  Laterality: Right;   LAPAROSCOPIC VAGINAL HYSTERECTOMY WITH SALPINGO OOPHORECTOMY Bilateral 06/29/2018   Procedure: LAPAROSCOPIC ASSISTED VAGINAL HYSTERECTOMY WITH SALPINGO OOPHORECTOMY;  Surgeon: Candice Camp, MD;  Location: [facility];  Service: Gynecology;  Laterality: Bilateral;  Request case for [facility] CRNA   LIPOSUCTION WITH LIPOFILLING N/A 07/25/2018   Procedure: LIPOSUCTION WITH LIPOFILLING FROM ABDOMEN TO CHEST;  Surgeon: Glenna Fellows, MD;  Location: [facility];  Service: Plastics;  Laterality: N/A;   LIPOSUCTION WITH LIPOFILLING Bilateral 12/01/2018   Procedure: LIPOFILLING TO BILATERAL CHEST REVISION;  Surgeon: Glenna Fellows, MD;  Location: [facility];  Service: Plastics;  Laterality: Bilateral;   NIPPLE SPARING MASTECTOMY Bilateral 02/28/2017   Procedure: RIGHT NIPPLE SPARING MASTECTOMY; LEFT PROPHYLACTIC NIPPLE SPARING MASTECTOMY;  Surgeon: Emelia Loron, MD;  Location: [facility];  Service: General;  Laterality: Bilateral;   PECTUS EXCAVATUM REPAIR  3432   47 years old- very large implant   REMOVAL OF TISSUE EXPANDER AND PLACEMENT OF IMPLANT Bilateral 07/25/2018   Procedure: REMOVAL OF TISSUE EXPANDER AND PLACEMENT OF SILICONE IMPLANT;  Surgeon: Glenna Fellows, MD;  Location: [facility];  Service:  Plastics;  Laterality: Bilateral;   SENTINEL NODE BIOPSY Right 02/28/2017   Procedure: RIGHT AXILLARY SENTINEL LYMPH  NODE BIOPSY;  Surgeon: Emelia Loron, MD;  Location: [facility];  Service: General;  Laterality: Right;   VULVAR LESION REMOVAL     WISDOM TOOTH EXTRACTION      SOCIAL HISTORY: Social History   Socioeconomic History   Marital status: Married    Spouse name: Katelyn Shelton   Number of children: Not on file   Years of education: Not on file   Highest education  level: Not on file  Occupational History   Not on file  Tobacco Use   Smoking status: Never   Smokeless tobacco: Never  Vaping Use   Vaping Use: Never used  Substance and Sexual Activity   Alcohol use: Yes    Alcohol/week: 1.0 - 2.0 standard drink of alcohol    Types: 1 - 2 Standard drinks or equivalent per week    Comment: occasional wine - twice monthly   Drug use: No   Sexual activity: Yes    Birth control/protection: Surgical  Other Topics Concern   Not on file  Social History Narrative   Married to Dr. Shirlee Latch. 3 children.    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    Masters in public history at [employer] Undergrad at [employer]: running, reading, exercise   Social Determinants of Corporate investment banker Strain: Not on file  Food Insecurity: Not on file  Transportation Needs: Not on file  Physical Activity: Not on file  Stress: Not on file  Social Connections: Not on file  Intimate Partner Violence: Not on file    FAMILY HISTORY: Family History  Problem Relation Age of Onset   Skin cancer Mother    Hyperlipidemia Mother    Hypertension Mother    Colon polyps Mother    Hyperlipidemia Father    Parkinson's disease Sister 86       early on set   Miscarriages / Stillbirths Sister    Diabetes Maternal Aunt    Colon polyps Maternal Uncle    Heart failure Maternal Grandmother    Stroke Maternal Grandmother    Heart attack  Maternal Grandfather    Depression Maternal Grandfather    Early death Maternal Grandfather        31 MI   Stomach cancer Paternal Grandmother    Prostate cancer Paternal Grandfather    Alzheimer's disease Paternal Grandfather    Breast cancer Cousin        mother's paternal first FEMALE cousin   Colon polyps Sister    Colon cancer Neg Hx    Rectal cancer Neg Hx     Review of Systems  Constitutional:  Negative for appetite change, chills, fatigue, fever and unexpected weight change.  HENT:   Negative for hearing loss, lump/mass and trouble swallowing.   Eyes:  Negative for eye problems and icterus.  Respiratory:  Negative for chest tightness, cough and shortness of breath.   Cardiovascular:  Negative for chest pain, leg swelling and palpitations.  Gastrointestinal:  Negative for abdominal distention, abdominal pain, constipation, diarrhea, nausea and vomiting.  Endocrine: Negative for hot flashes.  Genitourinary:  Negative for difficulty urinating.   Musculoskeletal:  Negative for arthralgias.  Skin:  Negative for itching and rash.  Neurological:  Negative for dizziness, extremity weakness, headaches and numbness.  Hematological:  Negative for adenopathy. Does not bruise/bleed easily.  Psychiatric/Behavioral:  Negative for depression. The patient is nervous/anxious.       PHYSICAL EXAMINATION    Vitals:   02/11/23 1219  BP: (!) 97/59  Pulse: 76  Resp: 16  Temp: 98.1 F (36.7 C)  SpO2: 100%    Physical Exam Constitutional:      General: She is not in acute distress.    Appearance: Normal appearance.  HENT:     Head: Normocephalic and atraumatic.  Eyes:     General: No scleral icterus.  Chest:     Comments: Right breast s/p mastectomy, area of skin thickening (attributed to fat necrosis per surgery) in right lower outer breast.  At 3 oclock about 0.5-1cmfn patient has increased TTP there is no nodularity present Neurological:     General: No focal deficit present.      Mental Status: She is alert.  Psychiatric:        Mood and Affect: Mood normal.        Behavior: Behavior normal.     LABORATORY DATA: None at this visit   ASSESSMENT and THERAPY PLAN:   Malignant neoplasm of upper-outer quadrant of right breast in female, estrogen receptor positive (HCC) Katelyn Shelton has a history of stage IIa ER/PR positive breast cancer.  She continues on exemestane daily and is status post bilateral mastectomies.  And adjuvant chemotherapy followed by adjuvant radiation.  I reviewed with Katelyn Shelton that the area that she is feeling over her right breast has no nodularity and I am not particularly concerned about it.  Should the pain worsen or should she develop any associated symptoms I let her know to please let us know because at that point we would likely image the area.  We will see her back in September for routine follow-up with labs prior with Dr. Pamelia Hoit.  Katelyn Shelton may be a good candidate for Dr. Pamelia Hoit to consider for Signatera testing.     All questions were answered. The patient knows to call the clinic with any problems, questions or concerns. We can certainly see the patient much sooner if necessary.  Total encounter time:20 minutes*in face-to-face visit time, chart review, lab review, care coordination, order entry, and documentation of the encounter time.    Lillard Anes, NP 02/11/23 12:50 PM Medical Oncology and Hematology [facility] [address] [place], [place] [address] Tel. [phone]    Fax. [phone]  *Total Encounter Time as defined by the Centers for Medicare and Medicaid Services includes, in addition to the face-to-face time of a patient visit (documented in the note above) non-face-to-face time: obtaining and reviewing outside history, ordering and reviewing medications, tests or procedures, care coordination (communications with other health care professionals or caregivers) and documentation in the medical record.

## 2023-02-11 NOTE — Assessment & Plan Note (Signed)
Katelyn Shelton has a history of stage IIa ER/PR positive breast cancer.  She continues on exemestane daily and is status post bilateral mastectomies.  And adjuvant chemotherapy followed by adjuvant radiation.  I reviewed with Katelyn Shelton that the area that she is feeling over her right breast has no nodularity and I am not particularly concerned about it.  Should the pain worsen or should she develop any associated symptoms I let her know to please let us know because at that point we would likely image the area.  We will see her back in September for routine follow-up with labs prior with Dr. Pamelia Hoit.  Katelyn Shelton may be a good candidate for Dr. Pamelia Hoit to consider for Signatera testing.

## 2023-02-14 ENCOUNTER — Other Ambulatory Visit: Payer: Self-pay | Admitting: Pharmacist

## 2023-02-14 ENCOUNTER — Other Ambulatory Visit (HOSPITAL_COMMUNITY): Payer: Self-pay

## 2023-02-14 MED ORDER — REPATHA SURECLICK 140 MG/ML ~~LOC~~ SOAJ
140.0000 mg | SUBCUTANEOUS | 11 refills | Status: DC
  Filled 2023-02-14: qty 2, 28d supply, fill #0
  Filled 2023-03-07: qty 2, 28d supply, fill #1
  Filled 2023-04-25: qty 2, 28d supply, fill #2
  Filled 2023-05-10 – 2023-05-11 (×2): qty 2, 28d supply, fill #3
  Filled 2023-06-07: qty 2, 28d supply, fill #4
  Filled 2023-07-11: qty 2, 28d supply, fill #5
  Filled 2023-08-09: qty 2, 28d supply, fill #6
  Filled 2023-08-29 – 2023-08-31 (×2): qty 2, 28d supply, fill #7
  Filled 2023-09-19 – 2023-09-23 (×2): qty 2, 28d supply, fill #8
  Filled 2023-10-25: qty 2, 28d supply, fill #9
  Filled 2023-11-21: qty 2, 28d supply, fill #10
  Filled 2023-12-15: qty 2, 28d supply, fill #11

## 2023-02-16 ENCOUNTER — Other Ambulatory Visit (HOSPITAL_COMMUNITY): Payer: Self-pay

## 2023-02-17 ENCOUNTER — Other Ambulatory Visit (HOSPITAL_COMMUNITY): Payer: Self-pay

## 2023-02-21 ENCOUNTER — Other Ambulatory Visit (HOSPITAL_COMMUNITY): Payer: Self-pay

## 2023-02-22 ENCOUNTER — Other Ambulatory Visit (HOSPITAL_COMMUNITY): Payer: Self-pay

## 2023-02-24 ENCOUNTER — Other Ambulatory Visit (HOSPITAL_COMMUNITY): Payer: Self-pay

## 2023-02-24 ENCOUNTER — Ambulatory Visit: Payer: Commercial Managed Care - PPO | Admitting: Physician Assistant

## 2023-02-24 DIAGNOSIS — M545 Low back pain, unspecified: Secondary | ICD-10-CM | POA: Diagnosis not present

## 2023-02-24 MED ORDER — METHOCARBAMOL 500 MG PO TABS
500.0000 mg | ORAL_TABLET | Freq: Four times a day (QID) | ORAL | 0 refills | Status: DC | PRN
  Filled 2023-02-24: qty 20, 5d supply, fill #0

## 2023-02-24 MED ORDER — LIDOCAINE 5 % EX PTCH
1.0000 | MEDICATED_PATCH | Freq: Every day | CUTANEOUS | 0 refills | Status: DC
  Filled 2023-02-24: qty 30, 30d supply, fill #0

## 2023-02-24 MED ORDER — MELOXICAM 15 MG PO TABS
15.0000 mg | ORAL_TABLET | Freq: Every day | ORAL | 0 refills | Status: DC
  Filled 2023-02-24: qty 30, 30d supply, fill #0

## 2023-03-07 ENCOUNTER — Other Ambulatory Visit (HOSPITAL_COMMUNITY): Payer: Self-pay

## 2023-03-07 ENCOUNTER — Other Ambulatory Visit: Payer: Self-pay | Admitting: Hematology and Oncology

## 2023-03-07 DIAGNOSIS — M5136 Other intervertebral disc degeneration, lumbar region: Secondary | ICD-10-CM | POA: Diagnosis not present

## 2023-03-07 DIAGNOSIS — M25562 Pain in left knee: Secondary | ICD-10-CM | POA: Diagnosis not present

## 2023-03-07 MED ORDER — BUPROPION HCL ER (XL) 150 MG PO TB24
150.0000 mg | ORAL_TABLET | Freq: Every day | ORAL | 3 refills | Status: DC
  Filled 2023-03-07: qty 90, 90d supply, fill #0
  Filled 2023-07-11: qty 90, 90d supply, fill #1

## 2023-03-08 ENCOUNTER — Other Ambulatory Visit (HOSPITAL_COMMUNITY): Payer: Self-pay

## 2023-03-10 ENCOUNTER — Ambulatory Visit: Payer: Self-pay | Admitting: Hematology and Oncology

## 2023-03-10 ENCOUNTER — Encounter: Payer: Self-pay | Admitting: Oncology

## 2023-03-10 ENCOUNTER — Other Ambulatory Visit (HOSPITAL_COMMUNITY): Payer: Self-pay

## 2023-03-10 ENCOUNTER — Other Ambulatory Visit: Payer: Self-pay

## 2023-03-11 ENCOUNTER — Other Ambulatory Visit (HOSPITAL_COMMUNITY): Payer: Self-pay

## 2023-03-17 ENCOUNTER — Encounter: Payer: Self-pay | Admitting: Oncology

## 2023-03-22 ENCOUNTER — Encounter: Payer: Self-pay | Admitting: Gastroenterology

## 2023-03-24 ENCOUNTER — Other Ambulatory Visit: Payer: Self-pay

## 2023-03-25 ENCOUNTER — Other Ambulatory Visit (HOSPITAL_COMMUNITY): Payer: Self-pay

## 2023-03-25 ENCOUNTER — Telehealth: Payer: Self-pay | Admitting: Pharmacist

## 2023-03-25 MED ORDER — ZEPBOUND 12.5 MG/0.5ML ~~LOC~~ SOAJ
12.5000 mg | SUBCUTANEOUS | 0 refills | Status: DC
  Filled 2023-03-25: qty 2, 28d supply, fill #0

## 2023-03-25 NOTE — Telephone Encounter (Signed)
Patient tolerating Zepbound 10 mg weekly dose well. Will titrate to 12.5 mg once week. Still has 2 weeks worth of Zepbound but going out of town for 6 weeks so would like to get next step filled.

## 2023-03-28 ENCOUNTER — Other Ambulatory Visit (HOSPITAL_COMMUNITY): Payer: Self-pay

## 2023-03-28 ENCOUNTER — Other Ambulatory Visit: Payer: Self-pay

## 2023-03-28 ENCOUNTER — Encounter: Payer: Self-pay | Admitting: Oncology

## 2023-03-28 ENCOUNTER — Other Ambulatory Visit: Payer: Self-pay | Admitting: Hematology and Oncology

## 2023-03-28 MED ORDER — OSPHENA 60 MG PO TABS
60.0000 mg | ORAL_TABLET | Freq: Every day | ORAL | 3 refills | Status: DC
  Filled 2023-03-28: qty 30, 30d supply, fill #0
  Filled 2023-04-26: qty 30, 30d supply, fill #1
  Filled 2023-05-27: qty 30, 30d supply, fill #2
  Filled 2023-06-29 (×2): qty 30, 30d supply, fill #3

## 2023-03-29 ENCOUNTER — Other Ambulatory Visit (HOSPITAL_COMMUNITY): Payer: Self-pay

## 2023-03-31 ENCOUNTER — Other Ambulatory Visit (HOSPITAL_COMMUNITY): Payer: Self-pay

## 2023-04-01 ENCOUNTER — Other Ambulatory Visit (HOSPITAL_COMMUNITY): Payer: Self-pay

## 2023-04-01 DIAGNOSIS — M5459 Other low back pain: Secondary | ICD-10-CM | POA: Diagnosis not present

## 2023-04-01 DIAGNOSIS — M25562 Pain in left knee: Secondary | ICD-10-CM | POA: Diagnosis not present

## 2023-04-01 MED ORDER — METHOCARBAMOL 500 MG PO TABS
500.0000 mg | ORAL_TABLET | Freq: Every evening | ORAL | 1 refills | Status: DC | PRN
  Filled 2023-04-01 – 2023-04-26 (×2): qty 30, 30d supply, fill #0

## 2023-04-13 ENCOUNTER — Other Ambulatory Visit (HOSPITAL_COMMUNITY): Payer: Self-pay

## 2023-04-25 ENCOUNTER — Other Ambulatory Visit (HOSPITAL_COMMUNITY): Payer: Self-pay

## 2023-04-26 ENCOUNTER — Other Ambulatory Visit (HOSPITAL_COMMUNITY): Payer: Self-pay

## 2023-04-26 ENCOUNTER — Encounter: Payer: Self-pay | Admitting: Pharmacist

## 2023-04-26 ENCOUNTER — Other Ambulatory Visit: Payer: Self-pay

## 2023-04-26 ENCOUNTER — Encounter: Payer: Self-pay | Admitting: Hematology and Oncology

## 2023-04-26 DIAGNOSIS — M5459 Other low back pain: Secondary | ICD-10-CM | POA: Diagnosis not present

## 2023-04-26 DIAGNOSIS — M25562 Pain in left knee: Secondary | ICD-10-CM | POA: Diagnosis not present

## 2023-04-26 DIAGNOSIS — R102 Pelvic and perineal pain: Secondary | ICD-10-CM | POA: Diagnosis not present

## 2023-04-26 MED ORDER — ZEPBOUND 15 MG/0.5ML ~~LOC~~ SOAJ
15.0000 mg | SUBCUTANEOUS | 11 refills | Status: DC
  Filled 2023-04-26: qty 2, 28d supply, fill #0
  Filled 2023-05-23: qty 2, 28d supply, fill #1
  Filled 2023-06-21 (×2): qty 2, 28d supply, fill #2
  Filled 2023-07-26: qty 2, 28d supply, fill #3
  Filled 2023-08-22: qty 2, 28d supply, fill #4
  Filled 2023-09-16: qty 2, 28d supply, fill #5
  Filled 2023-10-12: qty 2, 28d supply, fill #6
  Filled 2023-11-07: qty 2, 28d supply, fill #7
  Filled 2023-12-05: qty 2, 28d supply, fill #8
  Filled 2023-12-29: qty 2, 28d supply, fill #9
  Filled 2024-01-16 – 2024-01-26 (×2): qty 2, 28d supply, fill #10
  Filled 2024-02-23: qty 2, 28d supply, fill #11

## 2023-04-27 ENCOUNTER — Other Ambulatory Visit: Payer: Self-pay

## 2023-04-27 ENCOUNTER — Telehealth: Payer: Self-pay

## 2023-04-27 ENCOUNTER — Encounter: Payer: Self-pay | Admitting: Pharmacist

## 2023-04-27 ENCOUNTER — Encounter: Payer: Self-pay | Admitting: Hematology and Oncology

## 2023-04-27 ENCOUNTER — Other Ambulatory Visit (HOSPITAL_COMMUNITY): Payer: Self-pay

## 2023-04-27 DIAGNOSIS — N644 Mastodynia: Secondary | ICD-10-CM

## 2023-04-27 DIAGNOSIS — C50411 Malignant neoplasm of upper-outer quadrant of right female breast: Secondary | ICD-10-CM

## 2023-04-27 NOTE — Telephone Encounter (Signed)
Attempted to call pt to discuss concern via MyChart. LVM for call back.

## 2023-04-29 ENCOUNTER — Other Ambulatory Visit (HOSPITAL_COMMUNITY): Payer: Self-pay

## 2023-05-02 ENCOUNTER — Other Ambulatory Visit (HOSPITAL_COMMUNITY): Payer: Self-pay

## 2023-05-03 ENCOUNTER — Telehealth: Payer: Self-pay | Admitting: Pharmacist

## 2023-05-03 NOTE — Telephone Encounter (Signed)
Prior auth for [employer] under new CV indication submitted per pt request. Has been paying $550/month for Zepbound since [facility] stopped covering weight loss meds.  Key: [id number]. Immediate message back stating: "This drug/product is not covered under the pharmacy benefit. Prior Authorization is not available."

## 2023-05-10 ENCOUNTER — Other Ambulatory Visit (HOSPITAL_COMMUNITY): Payer: Self-pay

## 2023-05-10 ENCOUNTER — Ambulatory Visit
Admission: RE | Admit: 2023-05-10 | Discharge: 2023-05-10 | Disposition: A | Discharge status: Home / Self Care | Payer: Commercial Managed Care - PPO | Source: Ambulatory Visit | Attending: Hematology and Oncology | Admitting: Hematology and Oncology

## 2023-05-10 ENCOUNTER — Encounter: Payer: Self-pay | Admitting: Hematology and Oncology

## 2023-05-10 DIAGNOSIS — M25562 Pain in left knee: Secondary | ICD-10-CM | POA: Diagnosis not present

## 2023-05-10 DIAGNOSIS — C50411 Malignant neoplasm of upper-outer quadrant of right female breast: Secondary | ICD-10-CM

## 2023-05-10 DIAGNOSIS — N644 Mastodynia: Secondary | ICD-10-CM | POA: Diagnosis not present

## 2023-05-10 DIAGNOSIS — Z853 Personal history of malignant neoplasm of breast: Secondary | ICD-10-CM | POA: Diagnosis not present

## 2023-05-10 DIAGNOSIS — M5451 Vertebrogenic low back pain: Secondary | ICD-10-CM | POA: Diagnosis not present

## 2023-05-10 MED ORDER — GADOPICLENOL 0.5 MMOL/ML IV SOLN
5.5000 mL | Freq: Once | INTRAVENOUS | Status: AC | PRN
  Administered 2023-05-10: 5.5 mL via INTRAVENOUS

## 2023-05-11 ENCOUNTER — Telehealth: Payer: Self-pay

## 2023-05-11 ENCOUNTER — Encounter: Payer: Self-pay | Admitting: Hematology and Oncology

## 2023-05-11 ENCOUNTER — Other Ambulatory Visit (HOSPITAL_COMMUNITY): Payer: Self-pay

## 2023-05-11 NOTE — Telephone Encounter (Signed)
Called Pt regarding MyChart message. Pt's breast MR imaging came back showing no malignancy. Pt asking why she is having pain in her right breast. Per MD, Pt will need to speak with plastic surgeon as implant may need to be changed. Pt states she has seen several surgeons who "do not want to touch the implant". Pt rates right breast pain as 0.5/10. Patient states she will discuss with MD on Sept 2024 appt. Offered sooner MD appt, Pt declined.

## 2023-05-17 ENCOUNTER — Other Ambulatory Visit (HOSPITAL_COMMUNITY): Payer: Self-pay

## 2023-05-17 DIAGNOSIS — Z79899 Other long term (current) drug therapy: Secondary | ICD-10-CM | POA: Diagnosis not present

## 2023-05-17 DIAGNOSIS — L700 Acne vulgaris: Secondary | ICD-10-CM | POA: Diagnosis not present

## 2023-05-17 MED ORDER — ISOTRETINOIN 40 MG PO CAPS
40.0000 mg | ORAL_CAPSULE | Freq: Two times a day (BID) | ORAL | 0 refills | Status: DC
  Filled 2023-05-17: qty 30, 30d supply, fill #0

## 2023-05-18 ENCOUNTER — Encounter: Payer: Self-pay | Admitting: Hematology and Oncology

## 2023-05-23 ENCOUNTER — Other Ambulatory Visit (HOSPITAL_COMMUNITY): Payer: Self-pay

## 2023-05-23 ENCOUNTER — Telehealth: Payer: Self-pay | Admitting: Pharmacist

## 2023-05-23 NOTE — Telephone Encounter (Signed)
Scheduled appointment per staff message. Left voicemail for patient.

## 2023-05-24 ENCOUNTER — Other Ambulatory Visit (HOSPITAL_COMMUNITY): Payer: Self-pay

## 2023-05-27 ENCOUNTER — Other Ambulatory Visit: Payer: Self-pay

## 2023-05-30 ENCOUNTER — Telehealth: Payer: Self-pay | Admitting: Pharmacist

## 2023-05-30 NOTE — Telephone Encounter (Signed)
Patient is aware of rescheduled appointment dates and times

## 2023-06-02 ENCOUNTER — Ambulatory Visit: Payer: Commercial Managed Care - PPO | Admitting: Pharmacist

## 2023-06-06 DIAGNOSIS — Z9013 Acquired absence of bilateral breasts and nipples: Secondary | ICD-10-CM | POA: Diagnosis not present

## 2023-06-06 DIAGNOSIS — C50411 Malignant neoplasm of upper-outer quadrant of right female breast: Secondary | ICD-10-CM | POA: Diagnosis not present

## 2023-06-06 DIAGNOSIS — Z170 Estrogen receptor positive status [ER+]: Secondary | ICD-10-CM | POA: Diagnosis not present

## 2023-06-07 ENCOUNTER — Other Ambulatory Visit (HOSPITAL_COMMUNITY): Payer: Self-pay

## 2023-06-07 ENCOUNTER — Encounter: Payer: Self-pay | Admitting: Oncology

## 2023-06-08 ENCOUNTER — Other Ambulatory Visit (HOSPITAL_COMMUNITY): Payer: Self-pay

## 2023-06-13 ENCOUNTER — Ambulatory Visit (AMBULATORY_SURGERY_CENTER): Payer: Commercial Managed Care - PPO

## 2023-06-13 ENCOUNTER — Other Ambulatory Visit (HOSPITAL_COMMUNITY): Payer: Self-pay

## 2023-06-13 VITALS — Ht 61.0 in | Wt 117.0 lb

## 2023-06-13 DIAGNOSIS — Z83719 Family history of colon polyps, unspecified: Secondary | ICD-10-CM

## 2023-06-13 MED ORDER — NA SULFATE-K SULFATE-MG SULF 17.5-3.13-1.6 GM/177ML PO SOLN
1.0000 | Freq: Once | ORAL | 0 refills | Status: DC
  Filled 2023-06-13: qty 354, 2d supply, fill #0

## 2023-06-13 NOTE — Progress Notes (Signed)
Pre visit completed via phone call; Patient verified name, DOB, and address;  No egg or soy allergy known to patient  No issues known to pt with past sedation with any surgeries or procedures Patient denies ever being told they had issues or difficulty with intubation  No FH of Malignant Hyperthermia Pt is not on diet pills Pt is not on home 02  Pt is not on blood thinners  Pt denies issues with constipation  No A fib or A flutter Have any cardiac testing pending--NO Insurance verified during PV appt--- [facility] [employer]  Pt can ambulate without assistance;  Pt denies use of chewing tobacco Discussed diabetic/weight loss medication holds; Discussed NSAID holds; Checked BMI to be less than 50; Pt instructed to use Singlecare.com or GoodRx for a price reduction on prep  Patient's chart reviewed by Cathlyn Parsons CNRA prior to previsit and patient appropriate for the [facility].  Pre visit completed and red dot placed by patient's name on their procedure day (on provider's schedule).    Instructions sent to patients MyChart as well as printed out and mailed to the patient per her request;

## 2023-06-14 ENCOUNTER — Telehealth: Payer: Self-pay | Admitting: Gastroenterology

## 2023-06-14 ENCOUNTER — Encounter: Payer: Self-pay | Admitting: Pharmacist

## 2023-06-14 ENCOUNTER — Inpatient Hospital Stay: Payer: Commercial Managed Care - PPO | Attending: Hematology and Oncology | Admitting: Pharmacist

## 2023-06-14 ENCOUNTER — Other Ambulatory Visit: Payer: Self-pay | Admitting: Gastroenterology

## 2023-06-14 ENCOUNTER — Other Ambulatory Visit: Payer: Self-pay | Admitting: Hematology and Oncology

## 2023-06-14 ENCOUNTER — Other Ambulatory Visit: Payer: Self-pay

## 2023-06-14 ENCOUNTER — Other Ambulatory Visit (HOSPITAL_COMMUNITY): Payer: Self-pay

## 2023-06-14 DIAGNOSIS — Z79811 Long term (current) use of aromatase inhibitors: Secondary | ICD-10-CM | POA: Insufficient documentation

## 2023-06-14 DIAGNOSIS — Z170 Estrogen receptor positive status [ER+]: Secondary | ICD-10-CM | POA: Diagnosis not present

## 2023-06-14 DIAGNOSIS — C50411 Malignant neoplasm of upper-outer quadrant of right female breast: Secondary | ICD-10-CM | POA: Insufficient documentation

## 2023-06-14 DIAGNOSIS — Z83719 Family history of colon polyps, unspecified: Secondary | ICD-10-CM

## 2023-06-14 MED ORDER — ALPRAZOLAM 0.25 MG PO TABS
0.2500 mg | ORAL_TABLET | Freq: Two times a day (BID) | ORAL | 0 refills | Status: DC | PRN
  Filled 2023-06-14: qty 60, 30d supply, fill #0

## 2023-06-14 MED ORDER — NA SULFATE-K SULFATE-MG SULF 17.5-3.13-1.6 GM/177ML PO SOLN
1.0000 | Freq: Once | ORAL | 0 refills | Status: AC
  Filled 2023-06-14: qty 354, 2d supply, fill #0

## 2023-06-14 NOTE — Telephone Encounter (Signed)
Rx has been sent to [facility]. VM left making patient aware.

## 2023-06-14 NOTE — Telephone Encounter (Signed)
Inbound call from patient stating that she went to the pharmacy to pick up Suprep. Patient stated that they advised her that it was discontinued. Patient stated she did not know why and is requesting the prescription be resent. Please advise.

## 2023-06-14 NOTE — Progress Notes (Signed)
Severe anxiety: Sent a prescription for Xanax until she meets with her psychiatrist.

## 2023-06-14 NOTE — Progress Notes (Signed)
[facility]       Telephone: [phone]?Fax: [phone]   Oncology Clinical Pharmacist Practitioner Initial Assessment  Katelyn Shelton is a 47 y.o. female with a diagnosis of breast cancer currently on exemestane in the adjuvant setting under the care of Dr. Pamelia Hoit.  They were contacted today via in-person visit.  Current treatment regimen and start date Exemestane (07/09/19)  Katelyn Shelton was seen today by clinical pharmacy after being referred by Dr. Pamelia Hoit after she had some questions regarding her current medications. She states she has been on bupropion extended release 150 mg by mouth daily for quite some time and she feels that it helps her bone pain tremendously. Her main issue with the bupropion is she feels it makes her irritable so much so that her family members have pointed out to her that she can become "angry very quickly". She has gone to therapists but feels that there is still something she needs to help with her anxiety and temper. When she has come off of bupropion, even when tapering, she feels the bone pain has returned fairly quickly. She had been on venlafaxine 37.5 mg by mouth for anxiety related issues but she felt this was making her very sleepy and affecting her quality of life. She also gained a lot of weight per her report while on venlafaxine. We confirmed with her today her medication list and she stated she is not taking any other herbal supplements or over the counter medications.   We asked if she has tried OTC pain relievers such as naproxen, ibuprofen, or acetaminophen but she feels these have made her labs abnormal in the past and she is not interested in taking them. Since she feels the bupropion is what is causing her to have irritability, we asked if she could consider lowering the dose and potentially going to a different formulation on a steady taper but she is not interested in taking the medication multiple times a day. Although not  advised, she has been breaking the XL formulation of the drug instead. We explained the concerns with doing this and she verbalized understanding.  She does see Myer Haff from psychiatry on 07/04/23 and Dr. Pamelia Hoit again with labs on 07/08/23. One possible option that we reviewed with her was to try an as needed low dose of a benzodiazepine that will hopefully help with her anxiety until she can meet with psychiatry to discuss future treatment plans. She was agreeable to this plan. This was reviewed with Dr. Pamelia Hoit and he prescribed alprazolam (Xanax) 0.25 mg by mouth twice daily PRN for anxiety. We went over possible side effects of this drug and this was sent to her local pharmacy of choice.   Allergies Allergies  Allergen Reactions   Fentanyl Itching    Severe itching - Patient refuses this medication   Peach Flavor Anaphylaxis   Penicillins Hives    AS A CHILD   Sulfa Antibiotics Nausea And Vomiting   Sulfasalazine Nausea And Vomiting and Other (See Comments)   Latex Itching    Medication Reconciliation Current Outpatient Medications  Medication Sig Dispense Refill   buPROPion (WELLBUTRIN XL) 150 MG 24 hr tablet Take 1 tablet (150 mg total) by mouth daily. 90 tablet 3   Calcium Carbonate (CALCIUM 600 PO) Take 1 capsule by mouth daily.     exemestane (AROMASIN) 25 MG tablet Take 1 tablet (25 mg total) by mouth daily after breakfast. 90 tablet 3   ibandronate (BONIVA) 150 MG  tablet Take in the morning every 28 days; take with a full glass of water, on an empty stomach, and do not take anything else by mouth or lie down for the next 60 min. 4 tablet 4   ISOtretinoin (ACCUTANE) 40 MG capsule Take 1 capsule (40 mg) by mouth 2 times daily with a fatty meal (Patient taking differently: Take 40 mg by mouth daily.) 30 capsule 0   Multiple Vitamins-Minerals (MULTIVITAMIN WOMEN 50+) TABS Take 1 tablet by mouth daily.     Ospemifene (OSPHENA) 60 MG TABS Take 1 tablet (60 mg total) by mouth  daily. (Patient taking differently: Take 60 mg by mouth daily.) 30 tablet 3   tirzepatide (ZEPBOUND) 15 MG/0.5ML Pen Inject 15 mg into the skin once a week. 2 mL 11   ALPRAZolam (XANAX) 0.25 MG tablet Take 1 tablet (0.25 mg total) by mouth 2 (two) times daily as needed for anxiety. 60 tablet 0   Evolocumab (REPATHA SURECLICK) 140 MG/ML SOAJ Inject 140 mg (1 pen) into the skin every 14 (fourteen) days. (Patient taking differently: Inject 140 mg into the skin every 14 (fourteen) days.) 2 mL 11   lidocaine (LIDODERM) 5 % Place 1 patch onto the skin daily. (MAY WEAR UP TO 12HOURS) (Patient not taking: Reported on 06/13/2023) 30 patch 0   meloxicam (MOBIC) 15 MG tablet Take 1 tablet (15 mg total) by mouth daily with a meal (Patient not taking: Reported on 06/13/2023) 30 tablet 0   methocarbamol (ROBAXIN) 500 MG tablet Take 1 tablet (500 mg total) by mouth at bedtime as needed. (Patient not taking: Reported on 06/13/2023) 30 tablet 1   Na Sulfate-K Sulfate-Mg Sulf 17.5-3.13-1.6 GM/177ML SOLN Take as directed 354 mL 0   tazarotene (AVAGE) 0.1 % cream Apply a pea size amount to face topically at night as tolerated (Patient not taking: Reported on 06/13/2023) 30 g 11   No current facility-administered medications for this visit.   Medication reconciliation is based on the patient's most recent medication list in the electronic medical record (EMR) including herbal products and OTC medications.   The patient's medication list was reviewed today with the patient? Yes   Drug-drug interactions (DDIs) DDIs were evaluated? Yes Significant DDIs identified?  Possible increased isotretinoin levels with concurrent vitamin A use that is in her MDV  Drug-Food Interactions Drug-food interactions were evaluated? Yes Drug-food interactions identified? No   Follow-up Plan  Dr. Pamelia Hoit sent alprazolam (Xanax) 0.25 mg twice daily as needed for anxiety She will see psychiatry with Myer Haff DNP on 07/04/23.  Appreciate their recs Labs, Dr. Pamelia Hoit visit on 07/08/23 She was advised not to split bupropion XL tablets due to safety and efficacy risks She can follow up with clinical pharmacy as needed going forward -- we will send MyChart message with Dr. Earmon Phoenix plan to her today  Patience Musca participated in the discussion, expressed understanding, and voiced agreement with the above plan. All questions were answered to her satisfaction. The patient was advised to contact the clinic at [phone] with any questions or concerns prior to her return visit.   I spent 30 minutes assessing the patient.  Deedee Lybarger A. Odetta Pink, PharmD, BCOP, CPP  Anselm Lis, RPH-CPP, 06/14/2023 2:06 PM  **Disclaimer: This note was dictated with voice recognition software. Similar sounding words can inadvertently be transcribed and this note may contain transcription errors which may not have been corrected upon publication of note.**

## 2023-06-15 ENCOUNTER — Other Ambulatory Visit (HOSPITAL_COMMUNITY): Payer: Self-pay

## 2023-06-16 ENCOUNTER — Other Ambulatory Visit (HOSPITAL_COMMUNITY): Payer: Self-pay

## 2023-06-16 DIAGNOSIS — D225 Melanocytic nevi of trunk: Secondary | ICD-10-CM | POA: Diagnosis not present

## 2023-06-16 DIAGNOSIS — D2261 Melanocytic nevi of right upper limb, including shoulder: Secondary | ICD-10-CM | POA: Diagnosis not present

## 2023-06-16 DIAGNOSIS — D2262 Melanocytic nevi of left upper limb, including shoulder: Secondary | ICD-10-CM | POA: Diagnosis not present

## 2023-06-16 DIAGNOSIS — D1801 Hemangioma of skin and subcutaneous tissue: Secondary | ICD-10-CM | POA: Diagnosis not present

## 2023-06-16 DIAGNOSIS — D2271 Melanocytic nevi of right lower limb, including hip: Secondary | ICD-10-CM | POA: Diagnosis not present

## 2023-06-16 DIAGNOSIS — D485 Neoplasm of uncertain behavior of skin: Secondary | ICD-10-CM | POA: Diagnosis not present

## 2023-06-16 DIAGNOSIS — L853 Xerosis cutis: Secondary | ICD-10-CM | POA: Diagnosis not present

## 2023-06-16 DIAGNOSIS — L700 Acne vulgaris: Secondary | ICD-10-CM | POA: Diagnosis not present

## 2023-06-16 DIAGNOSIS — D2272 Melanocytic nevi of left lower limb, including hip: Secondary | ICD-10-CM | POA: Diagnosis not present

## 2023-06-16 MED ORDER — ISOTRETINOIN 40 MG PO CAPS
40.0000 mg | ORAL_CAPSULE | Freq: Every day | ORAL | 0 refills | Status: DC
  Filled 2023-06-16: qty 30, 30d supply, fill #0

## 2023-06-17 ENCOUNTER — Other Ambulatory Visit: Payer: Self-pay

## 2023-06-17 ENCOUNTER — Encounter: Payer: Self-pay | Admitting: Hematology and Oncology

## 2023-06-17 ENCOUNTER — Other Ambulatory Visit (HOSPITAL_COMMUNITY): Payer: Self-pay

## 2023-06-17 DIAGNOSIS — E7849 Other hyperlipidemia: Secondary | ICD-10-CM

## 2023-06-21 ENCOUNTER — Other Ambulatory Visit (HOSPITAL_COMMUNITY): Payer: Self-pay

## 2023-06-24 ENCOUNTER — Encounter: Payer: Self-pay | Admitting: Gastroenterology

## 2023-06-29 ENCOUNTER — Other Ambulatory Visit (HOSPITAL_COMMUNITY): Payer: Self-pay

## 2023-07-04 ENCOUNTER — Other Ambulatory Visit (HOSPITAL_COMMUNITY): Payer: Self-pay

## 2023-07-04 DIAGNOSIS — G4700 Insomnia, unspecified: Secondary | ICD-10-CM | POA: Diagnosis not present

## 2023-07-04 DIAGNOSIS — F410 Panic disorder [episodic paroxysmal anxiety] without agoraphobia: Secondary | ICD-10-CM | POA: Diagnosis not present

## 2023-07-04 DIAGNOSIS — F411 Generalized anxiety disorder: Secondary | ICD-10-CM | POA: Diagnosis not present

## 2023-07-04 MED ORDER — BUSPIRONE HCL 7.5 MG PO TABS
7.5000 mg | ORAL_TABLET | Freq: Two times a day (BID) | ORAL | 1 refills | Status: DC
  Filled 2023-07-04: qty 60, 30d supply, fill #0

## 2023-07-04 NOTE — Progress Notes (Signed)
Patient Care Team: Shelva Majestic, MD as PCP - General (Family Medicine) Meriam Sprague, MD as PCP - Cardiology (Cardiology) Emelia Loron, MD as Consulting Physician (General Surgery) Candice Camp, MD as Consulting Physician (Obstetrics and Gynecology) Seward Meth, Dennison Nancy, MD as Referring Physician (Hematology and Oncology) Dorothy Puffer, MD as Consulting Physician (Radiation Oncology) Glenna Fellows, MD as Consulting Physician (Plastic Surgery) Muss, Katha Cabal as Consulting Physician (Internal Medicine) York Spaniel, MD (Inactive) as Consulting Physician (Neurology) Lars Masson, MD as Consulting Physician (Cardiology) Serena Croissant, MD as Medical Oncologist (Hematology and Oncology)  DIAGNOSIS:  Encounter Diagnosis  Name Primary?   Malignant neoplasm of upper-outer quadrant of right breast in female, estrogen receptor positive (HCC) Yes    SUMMARY OF ONCOLOGIC HISTORY: Oncology History  Malignant neoplasm of upper-outer quadrant of right breast in female, estrogen receptor positive (HCC)  02/11/2017 Initial Diagnosis   Malignant neoplasm of upper-outer quadrant of right breast in female, estrogen receptor positive (HCC)   02/21/2017 Genetic Testing   Negative genetic testing on the STAT panel through [facility].  The STAT gene panel offered by [facility] and includes sequencing and rearrangement analysis for the following 9 genes: ATM, BRCA1, BRCA2, CDH1, CHEK2, PALB2, PTEN, STK11, and TP53.  The report date is February 19, 2017.  We re-requisitioned for a larger Multi gene panel, which was also negative.  The Multi-Gene Panel offered by [facility] includes sequencing and/or deletion duplication testing of the following 80 genes: ALK, APC, ATM, AXIN2,BAP1,  BARD1, BLM, BMPR1A, BRCA1, BRCA2, BRIP1, CASR, CDC73, CDH1, CDK4, CDKN1B, CDKN1C, CDKN2A (p14ARF), CDKN2A (p16INK4a), CEBPA, CHEK2, CTNNA1, DICER1, DIS3L2, EGFR (c.2369C>T, p.Thr790Met variant only),  EPCAM (Deletion/duplication testing only), FH, FLCN, GATA2, GPC3, GREM1 (Promoter region deletion/duplication testing only), HOXB13 (c.251G>A, p.Gly84Glu), HRAS, KIT, MAX, MEN1, MET, MITF (c.952G>A, p.Glu318Lys variant only), MLH1, MSH2, MSH3, MSH6, MUTYH, NBN, NF1, NF2, NTHL1, PALB2, PDGFRA, PHOX2B, PMS2, POLD1, POLE, POT1, PRKAR1A, PTCH1, PTEN, RAD50, RAD51C, RAD51D, RB1, RECQL4, RET, RUNX1, SDHAF2, SDHA (sequence changes only), SDHB, SDHC, SDHD, SMAD4, SMARCA4, SMARCB1, SMARCE1, STK11, SUFU, TERT, TERT, TMEM127, TP53, TSC1, TSC2, VHL, WRN and WT1.  The report date is February 21, 2017.      CHIEF COMPLIANT:   Discussed the use of AI scribe software for clinical note transcription with the patient, who gave verbal consent to proceed.  History of Present Illness   The patient, a survivor of breast cancer, presents with ongoing anxiety related to her past diagnosis and potential recurrence. She reports significant weight loss, which she attributes to her medication, Ozempic. She also mentions having undergone fat grafting surgery at [facility], which led to subsequent orthopedic problems. She has been experiencing various pains, including in her lower back, pelvis, and chest, which have caused her significant distress. However, she reports that these pains have subsided following confirmation that they were not indicative of cancer recurrence. She is currently taking several medications, including Ozempic, anastrozole, Wellbutrin, buspirone, and Accutane. She also mentions taking Osphena, which she reports has significantly improved her sexual health.         ALLERGIES:  is allergic to fentanyl, peach flavor, penicillins, sulfa antibiotics, sulfasalazine, and latex.  MEDICATIONS:  Current Outpatient Medications  Medication Sig Dispense Refill   ISOtretinoin (ZENATANE) 40 MG capsule Take 1 capsule (40 mg total) by mouth 2 (two) times daily.     ALPRAZolam (XANAX) 0.25 MG tablet Take 1 tablet (0.25 mg  total) by mouth 2 (two) times daily as needed for anxiety. 60  tablet 0   buPROPion (WELLBUTRIN XL) 150 MG 24 hr tablet Take 1 tablet (150 mg total) by mouth daily. 90 tablet 3   busPIRone (BUSPAR) 7.5 MG tablet Take 1 tablet (7.5 mg total) by mouth 2 (two) times daily. 60 tablet 1   Calcium Carbonate (CALCIUM 600 PO) Take 1 capsule by mouth daily.     Evolocumab (REPATHA SURECLICK) 140 MG/ML SOAJ Inject 140 mg (1 pen) into the skin every 14 (fourteen) days. (Patient taking differently: Inject 140 mg into the skin every 14 (fourteen) days.) 2 mL 11   exemestane (AROMASIN) 25 MG tablet Take 1 tablet (25 mg total) by mouth daily after breakfast. 90 tablet 3   ibandronate (BONIVA) 150 MG tablet Take in the morning every 28 days; take with a full glass of water, on an empty stomach, and do not take anything else by mouth or lie down for the next 60 min. 4 tablet 4   lidocaine (LIDODERM) 5 % Place 1 patch onto the skin daily. (MAY WEAR UP TO 12HOURS) (Patient not taking: Reported on 06/13/2023) 30 patch 0   meloxicam (MOBIC) 15 MG tablet Take 1 tablet (15 mg total) by mouth daily with a meal (Patient not taking: Reported on 06/13/2023) 30 tablet 0   methocarbamol (ROBAXIN) 500 MG tablet Take 1 tablet (500 mg total) by mouth at bedtime as needed. (Patient not taking: Reported on 06/13/2023) 30 tablet 1   Multiple Vitamins-Minerals (MULTIVITAMIN WOMEN 50+) TABS Take 1 tablet by mouth daily.     Ospemifene (OSPHENA) 60 MG TABS Take 1 tablet (60 mg total) by mouth daily. 90 tablet 3   tazarotene (AVAGE) 0.1 % cream Apply a pea size amount to face topically at night as tolerated (Patient not taking: Reported on 06/13/2023) 30 g 11   tirzepatide (ZEPBOUND) 15 MG/0.5ML Pen Inject 15 mg into the skin once a week. 2 mL 11   No current facility-administered medications for this visit.    PHYSICAL EXAMINATION: ECOG PERFORMANCE STATUS: 1 - Symptomatic but completely ambulatory  Vitals:   07/08/23 1030  BP: (!)  111/59  Pulse: 82  Resp: 18  Temp: 97.7 F (36.5 C)  SpO2: 100%   Filed Weights   07/08/23 1030  Weight: 116 lb 9.6 oz (52.9 kg)    Physical Exam          (exam performed in the presence of a chaperone)  LABORATORY DATA:  I have reviewed the data as listed    Latest Ref Rng & Units 07/08/2023   10:09 AM 09/10/2022    9:00 AM 03/10/2022    9:00 AM  CMP  Glucose 70 - 99 mg/dL 82  91  89   BUN 6 - 20 mg/dL 12  11  14    Creatinine 0.44 - 1.00 mg/dL 1.61  0.96  0.45   Sodium 135 - 145 mmol/L 142  141  141   Potassium 3.5 - 5.1 mmol/L 4.3  4.1  4.2   Chloride 98 - 111 mmol/L 106  109  105   CO2 22 - 32 mmol/L 28  26  30    Calcium 8.9 - 10.3 mg/dL 9.6  8.4  9.6   Total Protein 6.5 - 8.1 g/dL 8.0  7.6  7.5   Total Bilirubin 0.3 - 1.2 mg/dL 0.5  0.5  0.6   Alkaline Phos 38 - 126 U/L 47  62  60   AST 15 - 41 U/L 24  20  20  ALT 0 - 44 U/L 22  16  20      Lab Results  Component Value Date   WBC 6.0 07/08/2023   HGB 14.6 07/08/2023   HCT 43.5 07/08/2023   MCV 96.0 07/08/2023   PLT 259 07/08/2023   NEUTROABS 3.5 07/08/2023    ASSESSMENT & PLAN:  Malignant neoplasm of upper-outer quadrant of right breast in female, estrogen receptor positive (HCC) 02/08/17: T2 N0, stage IB IDC, grade 2, ER/PR positive, HER-2 nonamplified, with a Ki67-1 of 5% Oncotype 11 (9% ROR) 02/28/2017: Bilateral mastectomies: Left: Intraductal papilloma, right: T2N1 stage IIa grade 1 IDC (implants got infected and removed), MammaPrint: Low risk 02/11/2017: Tamoxifen started neoadjuvantly and held during chemo 02/19/2017: Genetics: Negative 04/15/2017-08/03/2017: Adjuvant chemo with Taxotere and Cytoxan every 3 weeks x4 at [facility] 09/30/2017: Completed radiation 10/25/2017: Anastrozole with goserelin until August 2019, underwent hysterectomy with BSO 06/29/2018 (plan is for 7 years)   Exemestane toxicities: Severe vaginal dryness: She had done [facility] touch and tells me it has not helped her.   Arna Snipe is  helping her significantly Weight gain secondary to antiestrogen therapy: She is on Ozempic and has lost all the weight. Decreased sexual desire: Much improved with Osphena Musculoskeletal aches and pains: Scans are negative including the breast MRI     She wishes to stay on exemestane indefinitely. ------------------------------------- Assessment and Plan    Breast Cancer Survivorship Patient is doing well with no new complaints. She has been experiencing some anxiety related to potential recurrence. Discussed the possibility of a new blood-based test (Signatera) that can detect bits and pieces of cancer DNA in the bloodstream. Patient is currently on anastrozole and wishes to continue indefinitely. -Continue anastrozole. -Consider Signatera testing, pending patient's decision after further research.  Weight Management Patient has successfully maintained weight loss, currently on Ozempic. -Continue Ozempic.  Vaginal Dryness Patient reports significant improvement with Osphena. -Continue Osphena 60mg  daily.  Acne Patient is on Accutane (isotretinoin) for acne management. -Continue Accutane 40mg  daily.  Anxiety Patient is on buspirone for anxiety management. -Continue buspirone as prescribed.  Follow-up in 6 months.          No orders of the defined types were placed in this encounter.  The patient has a good understanding of the overall plan. she agrees with it. she will call with any problems that may develop before the next visit here. Total time spent: 30 mins including face to face time and time spent for planning, charting and co-ordination of care   Tamsen Meek, MD 07/08/23

## 2023-07-08 ENCOUNTER — Inpatient Hospital Stay: Payer: Commercial Managed Care - PPO | Admitting: Hematology and Oncology

## 2023-07-08 ENCOUNTER — Other Ambulatory Visit (HOSPITAL_COMMUNITY): Payer: Self-pay

## 2023-07-08 ENCOUNTER — Inpatient Hospital Stay: Payer: Commercial Managed Care - PPO | Attending: Hematology and Oncology

## 2023-07-08 VITALS — BP 111/59 | HR 82 | Temp 97.7°F | Resp 18 | Ht 61.0 in | Wt 116.6 lb

## 2023-07-08 DIAGNOSIS — C50411 Malignant neoplasm of upper-outer quadrant of right female breast: Secondary | ICD-10-CM

## 2023-07-08 DIAGNOSIS — Z170 Estrogen receptor positive status [ER+]: Secondary | ICD-10-CM

## 2023-07-08 DIAGNOSIS — Z923 Personal history of irradiation: Secondary | ICD-10-CM | POA: Insufficient documentation

## 2023-07-08 DIAGNOSIS — L709 Acne, unspecified: Secondary | ICD-10-CM | POA: Diagnosis not present

## 2023-07-08 DIAGNOSIS — Z9221 Personal history of antineoplastic chemotherapy: Secondary | ICD-10-CM | POA: Diagnosis not present

## 2023-07-08 DIAGNOSIS — Z9013 Acquired absence of bilateral breasts and nipples: Secondary | ICD-10-CM | POA: Diagnosis not present

## 2023-07-08 DIAGNOSIS — F419 Anxiety disorder, unspecified: Secondary | ICD-10-CM | POA: Insufficient documentation

## 2023-07-08 DIAGNOSIS — Z79811 Long term (current) use of aromatase inhibitors: Secondary | ICD-10-CM | POA: Insufficient documentation

## 2023-07-08 LAB — LIPID PANEL
Cholesterol: 112 mg/dL (ref 0–200)
HDL: 45 mg/dL (ref >40)
LDL Cholesterol: 40 mg/dL (ref 0–99)
Total CHOL/HDL Ratio: 2.5 ratio
Triglycerides: 133 mg/dL (ref <150)
VLDL: 27 mg/dL (ref 0–40)

## 2023-07-08 LAB — CBC WITH DIFFERENTIAL (CANCER CENTER ONLY)
Abs Immature Granulocytes: 0.02 10*3/uL (ref 0.00–0.07)
Basophils Absolute: 0.1 10*3/uL (ref 0.0–0.1)
Basophils Relative: 1 %
Eosinophils Absolute: 0.1 10*3/uL (ref 0.0–0.5)
Eosinophils Relative: 2 %
HCT: 43.5 % (ref 36.0–46.0)
Hemoglobin: 14.6 g/dL (ref 12.0–15.0)
Immature Granulocytes: 0 %
Lymphocytes Relative: 29 %
Lymphs Abs: 1.7 10*3/uL (ref 0.7–4.0)
MCH: 32.2 pg (ref 26.0–34.0)
MCHC: 33.6 g/dL (ref 30.0–36.0)
MCV: 96 fL (ref 80.0–100.0)
Monocytes Absolute: 0.5 10*3/uL (ref 0.1–1.0)
Monocytes Relative: 9 %
Neutro Abs: 3.5 10*3/uL (ref 1.7–7.7)
Neutrophils Relative %: 59 %
Platelet Count: 259 10*3/uL (ref 150–400)
RBC: 4.53 MIL/uL (ref 3.87–5.11)
RDW: 12.2 % (ref 11.5–15.5)
WBC Count: 6 10*3/uL (ref 4.0–10.5)
nRBC: 0 % (ref 0.0–0.2)

## 2023-07-08 LAB — CMP (CANCER CENTER ONLY)
ALT: 22 U/L (ref 0–44)
AST: 24 U/L (ref 15–41)
Albumin: 4.6 g/dL (ref 3.5–5.0)
Alkaline Phosphatase: 47 U/L (ref 38–126)
Anion gap: 8 (ref 5–15)
BUN: 12 mg/dL (ref 6–20)
CO2: 28 mmol/L (ref 22–32)
Calcium: 9.6 mg/dL (ref 8.9–10.3)
Chloride: 106 mmol/L (ref 98–111)
Creatinine: 0.81 mg/dL (ref 0.44–1.00)
GFR, Estimated: >60 mL/min (ref >60)
Glucose, Bld: 82 mg/dL (ref 70–99)
Potassium: 4.3 mmol/L (ref 3.5–5.1)
Sodium: 142 mmol/L (ref 135–145)
Total Bilirubin: 0.5 mg/dL (ref 0.3–1.2)
Total Protein: 8 g/dL (ref 6.5–8.1)

## 2023-07-08 MED ORDER — EXEMESTANE 25 MG PO TABS
25.0000 mg | ORAL_TABLET | Freq: Every day | ORAL | 3 refills | Status: DC
  Filled 2023-07-08 – 2023-08-29 (×2): qty 90, 90d supply, fill #0
  Filled 2023-11-21: qty 90, 90d supply, fill #1
  Filled 2024-02-17: qty 90, 90d supply, fill #2
  Filled 2024-05-16: qty 90, 90d supply, fill #3

## 2023-07-08 MED ORDER — OSPHENA 60 MG PO TABS
60.0000 mg | ORAL_TABLET | Freq: Every day | ORAL | 3 refills | Status: DC
  Filled 2023-07-08 – 2023-08-03 (×2): qty 90, 90d supply, fill #0
  Filled 2023-10-10 – 2023-10-27 (×4): qty 90, 90d supply, fill #1
  Filled 2024-01-30: qty 90, 90d supply, fill #2
  Filled 2024-04-27: qty 90, 90d supply, fill #3

## 2023-07-08 MED ORDER — ISOTRETINOIN 40 MG PO CAPS: 40.0000 mg | ORAL_CAPSULE | Freq: Two times a day (BID) | ORAL | Status: DC

## 2023-07-08 NOTE — Assessment & Plan Note (Signed)
02/08/17: T2 N0, stage IB IDC, grade 2, ER/PR positive, HER-2 nonamplified, with a Ki67-1 of 5% Oncotype 11 (9% ROR) 02/28/2017: Bilateral mastectomies: Left: Intraductal papilloma, right: T2N1 stage IIa grade 1 IDC (implants got infected and removed), MammaPrint: Low risk 02/11/2017: Tamoxifen started neoadjuvantly and held during chemo 02/19/2017: Genetics: Negative 04/15/2017-08/03/2017: Adjuvant chemo with Taxotere and Cytoxan every 3 weeks x4 at [facility] 09/30/2017: Completed radiation 10/25/2017: Anastrozole with goserelin until August 2019, underwent hysterectomy with BSO 06/29/2018 (plan is for 7 years)   Anastrozole toxicities: Severe vaginal dryness: She had done [facility] touch and tells me it has not helped her.  She might consider going back to do it again. Weight gain secondary to anastrozole: She is on Ozempic and has lost all the weight. Decreased sexual desire: Patient wants to try Osphena Musculoskeletal aches and pains   Vaginal dryness: Patient is currently on Osphena. She understands that it has a risk of blood clots  She completed 5 years of therapy and we would like her to continue for 2 more years.

## 2023-07-11 ENCOUNTER — Encounter (HOSPITAL_COMMUNITY): Payer: Self-pay | Admitting: Pharmacist

## 2023-07-11 ENCOUNTER — Other Ambulatory Visit (HOSPITAL_COMMUNITY): Payer: Self-pay

## 2023-07-12 ENCOUNTER — Encounter: Payer: Self-pay | Admitting: Gastroenterology

## 2023-07-12 ENCOUNTER — Ambulatory Visit (AMBULATORY_SURGERY_CENTER): Payer: Commercial Managed Care - PPO | Admitting: Gastroenterology

## 2023-07-12 VITALS — BP 91/40 | HR 82 | Temp 97.7°F | Resp 18 | Ht 61.0 in | Wt 117.0 lb

## 2023-07-12 DIAGNOSIS — Z83719 Family history of colon polyps, unspecified: Secondary | ICD-10-CM | POA: Diagnosis not present

## 2023-07-12 DIAGNOSIS — Z1211 Encounter for screening for malignant neoplasm of colon: Secondary | ICD-10-CM | POA: Diagnosis not present

## 2023-07-12 DIAGNOSIS — F419 Anxiety disorder, unspecified: Secondary | ICD-10-CM | POA: Diagnosis not present

## 2023-07-12 MED ORDER — SODIUM CHLORIDE 0.9 % IV SOLN: 500.0000 mL | INTRAVENOUS | Status: DC

## 2023-07-12 NOTE — Progress Notes (Unsigned)
Report to PACU, RN, vss, BBS= Clear.  

## 2023-07-12 NOTE — Op Note (Signed)
[facility] Patient Name: Katelyn Shelton Procedure Date: 07/12/2023 1:40 PM MRN: [id number] Endoscopist: Napoleon Form , MD, [id number] Age: 47 Referring MD:  Date of Birth: 1976/05/26 Gender: Female Account #: [id number] Procedure:                Colonoscopy Indications:              Colon cancer screening in patient at increased                            risk: Family history of 1st-degree relative with                            colon polyps before age 42 years, Colon cancer                            screening in patient at increased risk: Family                            history of colon polyps in multiple 1st-degree                            relatives, High risk colon cancer surveillance:                            Personal history of colonic polyps Medicines:                Monitored Anesthesia Care Procedure:                Pre-Anesthesia Assessment:                           - Prior to the procedure, a History and Physical                            was performed, and patient medications and                            allergies were reviewed. The patient's tolerance of                            previous anesthesia was also reviewed. The risks                            and benefits of the procedure and the sedation                            options and risks were discussed with the patient.                            All questions were answered, and informed consent                            was obtained. Prior Anticoagulants: The patient has  taken no anticoagulant or antiplatelet agents. ASA                            Grade Assessment: II - A patient with mild systemic                            disease. After reviewing the risks and benefits,                            the patient was deemed in satisfactory condition to                            undergo the procedure.                           After obtaining informed consent,  the colonoscope                            was passed under direct vision. Throughout the                            procedure, the patient's blood pressure, pulse, and                            oxygen saturations were monitored continuously. The                            Olympus Scope [id number] was introduced through the                            anus and advanced to the the cecum, identified by                            appendiceal orifice and ileocecal valve. The                            colonoscopy was performed without difficulty. The                            patient tolerated the procedure well. The quality                            of the bowel preparation was good. The ileocecal                            valve, appendiceal orifice, and rectum were                            photographed. Scope In: 1:51:17 PM Scope Out: 2:02:39 PM Scope Withdrawal Time: 0 hours 8 minutes 45 seconds  Total Procedure Duration: 0 hours 11 minutes 22 seconds  Findings:                 The perianal and digital rectal examinations were  normal.                           Non-bleeding external and internal hemorrhoids were                            found during retroflexion. The hemorrhoids were                            small.                           The exam was otherwise without abnormality. Complications:            No immediate complications. Estimated Blood Loss:     Estimated blood loss was minimal. Impression:               - Non-bleeding external and internal hemorrhoids.                           - The examination was otherwise normal.                           - No specimens collected. Recommendation:           - Resume previous diet.                           - Continue present medications.                           - Repeat colonoscopy in 5 years for surveillance. Napoleon Form, MD 07/12/2023 2:13:22 PM This report has been signed  electronically.

## 2023-07-12 NOTE — Progress Notes (Unsigned)
[facility] History and Physical   Primary Care Physician:  Shelva Majestic, MD   Reason for Procedure:  History of adenomatous colon polyps  Plan:    Surveillance colonoscopy with possible interventions as needed     HPI: Katelyn Shelton is a very pleasant 47 y.o. female here for surveillance colonoscopy. Denies any nausea, vomiting, abdominal pain, melena or bright red blood per rectum  The risks and benefits as well as alternatives of endoscopic procedure(s) have been discussed and reviewed. All questions answered. The patient agrees to proceed.    Past Medical History:  Diagnosis Date   Allergy    Anxiety    venflafaxine XR 75 mg   Breast cancer (HCC) 01/2017   right; genetic testing negative in 2018 with [employer] panel   Heart murmur    as child- went away   History of MRSA infection    elbow   Hyperlipidemia    Infection of breast implant (HCC) 01/19/2021   Joint pain    due to medicine induced joint pain   Migraine    otc med prn   Miscarriage    x1   MRSA infection 01/28/2021   Nausea and vomiting 01/19/2021   Neuromuscular disorder (HCC)    neuropathy bilater feet - ? r/t chemo   Osteopenia    UTI (urinary tract infection)     Past Surgical History:  Procedure Laterality Date   AREOLA/NIPPLE RECONSTRUCTION WITH GRAFT Right 12/01/2018   Procedure: RIGHT NIPPLE AREOLA COMPLEX RECONSTRUCTION WITH FULL THICKNESS SKIN GRAFT FROM RIGHT GROIN;  Surgeon: Glenna Fellows, MD;  Location: [facility];  Service: Plastics;  Laterality: Right;   BREAST IMPLANT REMOVAL Right 01/20/2021   Procedure: REMOVAL RIGHT BREAST IMPLANT;  Surgeon: Glenna Fellows, MD;  Location: [facility];  Service: Plastics;  Laterality: Right;   BREAST IMPLANT REMOVAL Left 03/02/2021   Procedure: REMOVAL LEFT CHEST IMPLANT AND CAPSULECTOMY;  Surgeon: Glenna Fellows, MD;  Location: [facility];  Service: Plastics;  Laterality: Left;   BREAST  RECONSTRUCTION Left 12/01/2018   Procedure: LEFT BREAST REVISION RECONSTRUCTION WITH SKIN EXCISION;  Surgeon: Glenna Fellows, MD;  Location: [facility];  Service: Plastics;  Laterality: Left;   BREAST RECONSTRUCTION Left 12/01/2018   Procedure: COMPOSITE GRAFT FROM LEFT NIPPLE (NIPPLE SHARING);  Surgeon: Glenna Fellows, MD;  Location: [facility];  Service: Plastics;  Laterality: Left;   BREAST RECONSTRUCTION WITH PLACEMENT OF TISSUE EXPANDER AND FLEX HD (ACELLULAR HYDRATED DERMIS) Bilateral 02/28/2017   Procedure: BILATERAL BREAST RECONSTRUCTION WITH PLACEMENT OF TISSUE EXPANDER AND ALLODERM;  Surgeon: Glenna Fellows, MD;  Location: [facility];  Service: Plastics;  Laterality: Bilateral;   CESAREAN SECTION  2008,2010,2013   x 3   CESAREAN SECTION  02/04/2012   Procedure: CESAREAN SECTION;  Surgeon: Jeani Hawking, MD;  Location: [facility];  Service: Gynecology;  Laterality: N/A;  Repeat Cesarean Section Delivery  Boy  @  [phone], Apgars 9/10   COLONOSCOPY  2019   KN-MAC-suprep(exc)-hems/inflammatory polyp   DILATION AND CURETTAGE OF UTERUS     EXCISION OF BREAST LESION Right 03/15/2017   Procedure: RIGHT NIPPLE AND AREOLA EXCISION;  Surgeon: Emelia Loron, MD;  Location: [facility];  Service: General;  Laterality: Right;   LAPAROSCOPIC VAGINAL HYSTERECTOMY WITH SALPINGO OOPHORECTOMY Bilateral 06/29/2018   Procedure: LAPAROSCOPIC ASSISTED VAGINAL HYSTERECTOMY WITH SALPINGO OOPHORECTOMY;  Surgeon: Candice Camp, MD;  Location: [facility];  Service: Gynecology;  Laterality: Bilateral;  Request case  for [facility] CRNA   LIPOSUCTION WITH LIPOFILLING N/A 07/25/2018   Procedure: LIPOSUCTION WITH LIPOFILLING FROM ABDOMEN TO CHEST;  Surgeon: Glenna Fellows, MD;  Location: [facility];  Service: Plastics;  Laterality: N/A;   LIPOSUCTION WITH LIPOFILLING Bilateral 12/01/2018   Procedure: LIPOFILLING TO BILATERAL CHEST REVISION;   Surgeon: Glenna Fellows, MD;  Location: [facility];  Service: Plastics;  Laterality: Bilateral;   NIPPLE SPARING MASTECTOMY Bilateral 02/28/2017   Procedure: RIGHT NIPPLE SPARING MASTECTOMY; LEFT PROPHYLACTIC NIPPLE SPARING MASTECTOMY;  Surgeon: Emelia Loron, MD;  Location: [facility];  Service: General;  Laterality: Bilateral;   PECTUS EXCAVATUM REPAIR  8065   47 years old- very large implant   REMOVAL OF TISSUE EXPANDER AND PLACEMENT OF IMPLANT Bilateral 07/25/2018   Procedure: REMOVAL OF TISSUE EXPANDER AND PLACEMENT OF SILICONE IMPLANT;  Surgeon: Glenna Fellows, MD;  Location: [facility];  Service: Plastics;  Laterality: Bilateral;   SENTINEL NODE BIOPSY Right 02/28/2017   Procedure: RIGHT AXILLARY SENTINEL LYMPH  NODE BIOPSY;  Surgeon: Emelia Loron, MD;  Location: [facility];  Service: General;  Laterality: Right;   VULVAR LESION REMOVAL     WISDOM TOOTH EXTRACTION      Prior to Admission medications   Medication Sig Start Date End Date Taking? Authorizing Provider  buPROPion (WELLBUTRIN XL) 150 MG 24 hr tablet Take 1 tablet (150 mg total) by mouth daily. 03/07/23  Yes Serena Croissant, MD  busPIRone (BUSPAR) 7.5 MG tablet Take 1 tablet (7.5 mg total) by mouth 2 (two) times daily. 07/04/23  Yes   Calcium Carbonate (CALCIUM 600 PO) Take 1 capsule by mouth daily.   Yes [provider]  exemestane (AROMASIN) 25 MG tablet Take 1 tablet (25 mg total) by mouth daily after breakfast. 07/08/23  Yes Serena Croissant, MD  ISOtretinoin (ZENATANE) 40 MG capsule Take 1 capsule (40 mg total) by mouth 2 (two) times daily. 07/08/23  Yes Serena Croissant, MD  Multiple Vitamins-Minerals (MULTIVITAMIN WOMEN 50+) TABS Take 1 tablet by mouth daily. 11/25/17  Yes [provider]  Ospemifene (OSPHENA) 60 MG TABS Take 1 tablet (60 mg total) by mouth daily. 07/08/23  Yes Serena Croissant, MD  ALPRAZolam Prudy Feeler) 0.25 MG tablet Take 1 tablet  (0.25 mg total) by mouth 2 (two) times daily as needed for anxiety. 06/14/23   Serena Croissant, MD  Evolocumab (REPATHA SURECLICK) 140 MG/ML SOAJ Inject 140 mg (1 pen) into the skin every 14 (fourteen) days. Patient taking differently: Inject 140 mg into the skin every 14 (fourteen) days. 02/14/23   Meriam Sprague, MD  ibandronate (BONIVA) 150 MG tablet Take in the morning every 28 days; take with a full glass of water, on an empty stomach, and do not take anything else by mouth or lie down for the next 60 min. 10/26/22   Serena Croissant, MD  lidocaine (LIDODERM) 5 % Place 1 patch onto the skin daily. (MAY WEAR UP TO 12HOURS) Patient not taking: Reported on 06/13/2023 02/24/23     meloxicam (MOBIC) 15 MG tablet Take 1 tablet (15 mg total) by mouth daily with a meal Patient not taking: Reported on 06/13/2023 02/24/23     methocarbamol (ROBAXIN) 500 MG tablet Take 1 tablet (500 mg total) by mouth at bedtime as needed. Patient not taking: Reported on 06/13/2023 04/01/23     tazarotene (AVAGE) 0.1 % cream Apply a pea size amount to face topically at night as tolerated Patient not taking: Reported on 06/13/2023  11/16/22     tirzepatide (ZEPBOUND) 15 MG/0.5ML Pen Inject 15 mg into the skin once a week. 04/26/23   Meriam Sprague, MD    Current Outpatient Medications  Medication Sig Dispense Refill   buPROPion (WELLBUTRIN XL) 150 MG 24 hr tablet Take 1 tablet (150 mg total) by mouth daily. 90 tablet 3   busPIRone (BUSPAR) 7.5 MG tablet Take 1 tablet (7.5 mg total) by mouth 2 (two) times daily. 60 tablet 1   Calcium Carbonate (CALCIUM 600 PO) Take 1 capsule by mouth daily.     exemestane (AROMASIN) 25 MG tablet Take 1 tablet (25 mg total) by mouth daily after breakfast. 90 tablet 3   ISOtretinoin (ZENATANE) 40 MG capsule Take 1 capsule (40 mg total) by mouth 2 (two) times daily.     Multiple Vitamins-Minerals (MULTIVITAMIN WOMEN 50+) TABS Take 1 tablet by mouth daily.     Ospemifene (OSPHENA) 60 MG TABS Take 1  tablet (60 mg total) by mouth daily. 90 tablet 3   ALPRAZolam (XANAX) 0.25 MG tablet Take 1 tablet (0.25 mg total) by mouth 2 (two) times daily as needed for anxiety. 60 tablet 0   Evolocumab (REPATHA SURECLICK) 140 MG/ML SOAJ Inject 140 mg (1 pen) into the skin every 14 (fourteen) days. (Patient taking differently: Inject 140 mg into the skin every 14 (fourteen) days.) 2 mL 11   ibandronate (BONIVA) 150 MG tablet Take in the morning every 28 days; take with a full glass of water, on an empty stomach, and do not take anything else by mouth or lie down for the next 60 min. 4 tablet 4   lidocaine (LIDODERM) 5 % Place 1 patch onto the skin daily. (MAY WEAR UP TO 12HOURS) (Patient not taking: Reported on 06/13/2023) 30 patch 0   meloxicam (MOBIC) 15 MG tablet Take 1 tablet (15 mg total) by mouth daily with a meal (Patient not taking: Reported on 06/13/2023) 30 tablet 0   methocarbamol (ROBAXIN) 500 MG tablet Take 1 tablet (500 mg total) by mouth at bedtime as needed. (Patient not taking: Reported on 06/13/2023) 30 tablet 1   tazarotene (AVAGE) 0.1 % cream Apply a pea size amount to face topically at night as tolerated (Patient not taking: Reported on 06/13/2023) 30 g 11   tirzepatide (ZEPBOUND) 15 MG/0.5ML Pen Inject 15 mg into the skin once a week. 2 mL 11   Current Facility-Administered Medications  Medication Dose Route Frequency Provider Last Rate Last Admin   0.9 %  sodium chloride infusion  500 mL Intravenous Continuous Macalister Arnaud, Eleonore Chiquito, MD        Allergies as of 07/12/2023 - Review Complete 07/12/2023  Allergen Reaction Noted   Fentanyl Itching 02/04/2012   Peach flavor Anaphylaxis 02/04/2012   Penicillins Hives 01/16/2010   Sulfa antibiotics Nausea And Vomiting 02/21/2017   Sulfasalazine Nausea And Vomiting and Other (See Comments) 02/21/2017   Latex Itching 02/02/2012    Family History  Problem Relation Age of Onset   Skin cancer Mother    Hyperlipidemia Mother    Hypertension Mother     Colon polyps Mother    Hyperlipidemia Father    Parkinson's disease Sister 25       early on set   Miscarriages / Stillbirths Sister    Colon polyps Sister    Diabetes Maternal Aunt    Colon polyps Maternal Uncle    Heart failure Maternal Grandmother    Stroke Maternal Grandmother    Heart attack Maternal Grandfather  Depression Maternal Grandfather    Early death Maternal Grandfather        51 MI   Stomach cancer Paternal Grandmother        alcohol use   Prostate cancer Paternal Grandfather    Alzheimer's disease Paternal Grandfather    Breast cancer Cousin        mother's paternal first FEMALE cousin   Colon cancer Neg Hx    Rectal cancer Neg Hx    Esophageal cancer Neg Hx     Social History   Socioeconomic History   Marital status: Married    Spouse name: [name]   Number of children: Not on file   Years of education: Not on file   Highest education level: Not on file  Occupational History   Not on file  Tobacco Use   Smoking status: Never   Smokeless tobacco: Never  Vaping Use   Vaping status: Never Used  Substance and Sexual Activity   Alcohol use: Yes    Alcohol/week: 1.0 - 2.0 standard drink of alcohol    Types: 1 - 2 Standard drinks or equivalent per week    Comment: occasional wine - twice monthly   Drug use: No   Sexual activity: Yes    Birth control/protection: Surgical  Other Topics Concern   Not on file  Social History Narrative   Married to Dr. Shirlee Latch. 3 children.    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    Masters in public history at [employer]. Undergrad at [employer]: running, reading, exercise   Social Determinants of Health   Financial Resource Strain: Not on file  Food Insecurity: Not on file  Transportation Needs: Not on file  Physical Activity: Not on file  Stress: Not on file  Social Connections: Not on file  Intimate Partner Violence: Not on file    Review of Systems:  All  other review of systems negative except as mentioned in the HPI.  Physical Exam: Vital signs in last 24 hours: BP (!) 103/51   Pulse 83   Temp 97.7 F (36.5 C) (Temporal)   Resp 20   Ht 5\' 1"  (1.549 m)   Wt 117 lb (53.1 kg)   LMP 09/01/2017   SpO2 99%   BMI 22.11 kg/m  General:   Alert, NAD Lungs:  Clear .   Heart:  Regular rate and rhythm Abdomen:  Soft, nontender and nondistended. Neuro/Psych:  Alert and cooperative. Normal mood and affect. A and O x 3  Reviewed labs, radiology imaging, old records and pertinent past GI work up  Patient is appropriate for planned procedure(s) and anesthesia in an ambulatory setting   K. Scherry Ran , MD [phone]

## 2023-07-12 NOTE — Progress Notes (Unsigned)
Pt's states no medical or surgical changes since previsit or office visit. 

## 2023-07-12 NOTE — Patient Instructions (Addendum)
-   Resume previous diet. - Continue present medications. - Repeat colonoscopy in 5 years for surveillance.  YOU HAD AN ENDOSCOPIC PROCEDURE TODAY AT THE [facility]:   Refer to the procedure report that was given to you for any specific questions about what was found during the examination.  If the procedure report does not answer your questions, please call your gastroenterologist to clarify.  If you requested that your care partner not be given the details of your procedure findings, then the procedure report has been included in a sealed envelope for you to review at your convenience later.  YOU SHOULD EXPECT: Some feelings of bloating in the abdomen. Passage of more gas than usual.  Walking can help get rid of the air that was put into your GI tract during the procedure and reduce the bloating. If you had a lower endoscopy (such as a colonoscopy or flexible sigmoidoscopy) you may notice spotting of blood in your stool or on the toilet paper. If you underwent a bowel prep for your procedure, you may not have a normal bowel movement for a few days.  Please Note:  You might notice some irritation and congestion in your nose or some drainage.  This is from the oxygen used during your procedure.  There is no need for concern and it should clear up in a day or so.  SYMPTOMS TO REPORT IMMEDIATELY:  Following lower endoscopy (colonoscopy or flexible sigmoidoscopy):  Excessive amounts of blood in the stool  Significant tenderness or worsening of abdominal pains  Swelling of the abdomen that is new, acute  Fever of 100F or higher  For urgent or emergent issues, a gastroenterologist can be reached at any hour by calling [phone]. Do not use MyChart messaging for urgent concerns.    DIET:  We do recommend a small meal at first, but then you may proceed to your regular diet.  Drink plenty of fluids but you should avoid alcoholic beverages for 24 hours.  ACTIVITY:  You should plan to  take it easy for the rest of today and you should NOT DRIVE or use heavy machinery until tomorrow (because of the sedation medicines used during the test).    FOLLOW UP: Our staff will call the number listed on your records the next business day following your procedure.  We will call around 7:15- 8:00 am to check on you and address any questions or concerns that you may have regarding the information given to you following your procedure. If we do not reach you, we will leave a message.     If any biopsies were taken you will be contacted by phone or by letter within the next 1-3 weeks.  Please call us at [phone] if you have not heard about the biopsies in 3 weeks.    SIGNATURES/CONFIDENTIALITY: You and/or your care partner have signed paperwork which will be entered into your electronic medical record.  These signatures attest to the fact that that the information above on your After Visit Summary has been reviewed and is understood.  Full responsibility of the confidentiality of this discharge information lies with you and/or your care-partner.

## 2023-07-13 ENCOUNTER — Telehealth: Payer: Self-pay | Admitting: *Deleted

## 2023-07-13 NOTE — Telephone Encounter (Signed)
  Follow up Call-     07/12/2023    1:37 PM  Call back number  Post procedure Call Back phone  # [phone]  Permission to leave phone message Yes   [facility]

## 2023-07-15 ENCOUNTER — Other Ambulatory Visit: Payer: Self-pay | Admitting: *Deleted

## 2023-07-15 DIAGNOSIS — B3781 Candidal esophagitis: Secondary | ICD-10-CM

## 2023-07-15 NOTE — Addendum Note (Signed)
Addended by: Marlowe Kays on: 07/15/2023 02:36 PM   Modules accepted: Orders

## 2023-07-19 ENCOUNTER — Other Ambulatory Visit: Payer: Self-pay

## 2023-07-19 ENCOUNTER — Other Ambulatory Visit (HOSPITAL_COMMUNITY): Payer: Self-pay

## 2023-07-19 DIAGNOSIS — Z79899 Other long term (current) drug therapy: Secondary | ICD-10-CM | POA: Diagnosis not present

## 2023-07-19 DIAGNOSIS — L700 Acne vulgaris: Secondary | ICD-10-CM | POA: Diagnosis not present

## 2023-07-19 MED ORDER — ISOTRETINOIN 40 MG PO CAPS
40.0000 mg | ORAL_CAPSULE | Freq: Every day | ORAL | 0 refills | Status: DC
Start: 2023-07-19 — End: 2023-08-18
  Filled 2023-07-19: qty 30, 30d supply, fill #0

## 2023-07-20 ENCOUNTER — Telehealth: Payer: Self-pay | Admitting: Gastroenterology

## 2023-07-20 ENCOUNTER — Other Ambulatory Visit (HOSPITAL_COMMUNITY): Payer: Self-pay

## 2023-07-20 NOTE — Telephone Encounter (Signed)
Called the patient. No answer. Left message to return my call.

## 2023-07-20 NOTE — Telephone Encounter (Signed)
Patient reports "a little tenderness left side" of the rectum, constipation and blood with bowel movement x 2 stools. She is on Zepbound and thinks this contributes to her constipation. Willing to take stool softener.  Please advise.

## 2023-07-20 NOTE — Telephone Encounter (Signed)
Inbound call from patient stating she has started experiencing rectal bleeding after 9/17 colonoscopy. Patient requesting a call back to discuss further and to be advised. Please advise, thank you.

## 2023-07-22 NOTE — Telephone Encounter (Signed)
Patient advised.

## 2023-07-22 NOTE — Telephone Encounter (Signed)
Agree with adding daily stool softener, docusate 1 to 2 capsule at bedtime but if has persistent constipation, please advise patient to add MiraLAX 1 capful daily.  Zepbound is likely exacerbating constipation

## 2023-07-26 ENCOUNTER — Telehealth: Payer: Self-pay | Admitting: Pharmacist

## 2023-07-26 ENCOUNTER — Other Ambulatory Visit (HOSPITAL_COMMUNITY): Payer: Self-pay

## 2023-07-26 MED ORDER — REPATHA SURECLICK 140 MG/ML ~~LOC~~ SOAJ: 140.0000 mg | SUBCUTANEOUS | 0 refills | Status: DC

## 2023-07-26 NOTE — Telephone Encounter (Signed)
Pt called in [employer] is sending a request about pt's Repatha. She states it needs to be signed and faxed back over. She called to make [facility] aware.

## 2023-07-26 NOTE — Telephone Encounter (Addendum)
Fax for product replacement pen from [facility] pharmacy has been received, Repatha pen escribed.

## 2023-08-01 ENCOUNTER — Other Ambulatory Visit (HOSPITAL_COMMUNITY): Payer: Self-pay

## 2023-08-01 DIAGNOSIS — F410 Panic disorder [episodic paroxysmal anxiety] without agoraphobia: Secondary | ICD-10-CM | POA: Diagnosis not present

## 2023-08-01 DIAGNOSIS — G4700 Insomnia, unspecified: Secondary | ICD-10-CM | POA: Diagnosis not present

## 2023-08-01 DIAGNOSIS — F411 Generalized anxiety disorder: Secondary | ICD-10-CM | POA: Diagnosis not present

## 2023-08-01 MED ORDER — GABAPENTIN 250 MG/5ML PO SOLN
25.0000 mg | Freq: Every day | ORAL | 1 refills | Status: DC
  Filled 2023-08-01: qty 30, 30d supply, fill #0
  Filled 2023-08-31 (×2): qty 30, 30d supply, fill #1

## 2023-08-01 MED ORDER — BUPROPION HCL 75 MG PO TABS
75.0000 mg | ORAL_TABLET | Freq: Every day | ORAL | 1 refills | Status: DC
  Filled 2023-08-01: qty 30, 30d supply, fill #0
  Filled 2023-09-05: qty 30, 30d supply, fill #1

## 2023-08-03 ENCOUNTER — Other Ambulatory Visit (HOSPITAL_COMMUNITY): Payer: Self-pay

## 2023-08-04 ENCOUNTER — Other Ambulatory Visit (HOSPITAL_COMMUNITY): Payer: Self-pay

## 2023-08-09 ENCOUNTER — Other Ambulatory Visit (HOSPITAL_COMMUNITY): Payer: Self-pay

## 2023-08-10 ENCOUNTER — Other Ambulatory Visit (HOSPITAL_COMMUNITY): Payer: Self-pay

## 2023-08-18 ENCOUNTER — Other Ambulatory Visit (HOSPITAL_COMMUNITY): Payer: Self-pay

## 2023-08-18 DIAGNOSIS — L0889 Other specified local infections of the skin and subcutaneous tissue: Secondary | ICD-10-CM | POA: Diagnosis not present

## 2023-08-18 DIAGNOSIS — Z79899 Other long term (current) drug therapy: Secondary | ICD-10-CM | POA: Diagnosis not present

## 2023-08-18 DIAGNOSIS — L700 Acne vulgaris: Secondary | ICD-10-CM | POA: Diagnosis not present

## 2023-08-18 DIAGNOSIS — L011 Impetiginization of other dermatoses: Secondary | ICD-10-CM | POA: Diagnosis not present

## 2023-08-18 DIAGNOSIS — L309 Dermatitis, unspecified: Secondary | ICD-10-CM | POA: Diagnosis not present

## 2023-08-18 MED ORDER — ISOTRETINOIN 40 MG PO CAPS
40.0000 mg | ORAL_CAPSULE | Freq: Every day | ORAL | 0 refills | Status: DC
Start: 2023-08-18 — End: 2023-09-19
  Filled 2023-08-18: qty 30, 30d supply, fill #0

## 2023-08-19 ENCOUNTER — Other Ambulatory Visit (HOSPITAL_COMMUNITY): Payer: Self-pay

## 2023-08-22 ENCOUNTER — Other Ambulatory Visit (HOSPITAL_COMMUNITY): Payer: Self-pay

## 2023-08-22 MED ORDER — MUPIROCIN 2 % EX OINT
TOPICAL_OINTMENT | Freq: Two times a day (BID) | CUTANEOUS | 0 refills | Status: DC
  Filled 2023-08-22: qty 22, 5d supply, fill #0

## 2023-08-22 MED ORDER — CEPHALEXIN 500 MG PO CAPS
500.0000 mg | ORAL_CAPSULE | Freq: Three times a day (TID) | ORAL | 0 refills | Status: DC
Start: 2023-08-22 — End: 2023-12-22
  Filled 2023-08-22: qty 7, 2d supply, fill #0
  Filled 2023-08-22: qty 23, 8d supply, fill #0

## 2023-08-29 ENCOUNTER — Other Ambulatory Visit: Payer: Self-pay

## 2023-08-29 ENCOUNTER — Other Ambulatory Visit (HOSPITAL_COMMUNITY): Payer: Self-pay

## 2023-08-29 ENCOUNTER — Telehealth: Payer: Self-pay | Admitting: Family Medicine

## 2023-08-29 DIAGNOSIS — R109 Unspecified abdominal pain: Secondary | ICD-10-CM | POA: Diagnosis not present

## 2023-08-29 NOTE — Telephone Encounter (Signed)
Caller is Administrator, sports, triage nurse who stated final outcome was for pt to be seen within 3-4 hours. States patient informed her she can't do that and prefers to be seen tomorrow. Triage nurse stated this would be fine. Patient has been scheduled with Dulce Sellar for 08/30/23 @ 11:30 am.        Patient Name First: [facility] Last: [facility] Gender: Female DOB: 12-24-1975 Age: 47 Y 5 M 12 D Return Phone Number: [phone] (Primary) Address: City/ State/ Zip: [place]  [address] Client [facility] - Administrator, sports at [facility] Provider Tana Conch- MD Contact Type Call Who Is Calling Patient / Member / Family / Caregiver Call Type Triage / Clinical Relationship To Patient Self Return Phone Number [phone] (Primary) Chief Complaint Abdominal Pain Reason for Call Symptomatic / Request for Health Information Initial Comment Caller states that she is transferring a patient for triage. Katelyn patient has been having mild abdominal pain for a couple of months now and Katelyn pain is worsening. It has been ever since her colonoscopy. Translation No Nurse Assessment Nurse: Shawnie Dapper, RN, Arline Asp Date/Time (Eastern Time): 08/29/2023 2:52:08 PM Confirm and document reason for call. If symptomatic, describe symptoms. ---Caller states that she had a colonoscopy in September and has been having abdominal pain. Caller states that she noticed dull aching pain. Caller states that she is on Keflex now. Caller states that she did have a pelvic scan in July and it was normal. Caller states that she has pelvic pain is 1-2/10 and has been more constant recently. Does Katelyn patient have any new or worsening symptoms? ---Yes Will a triage be completed? ---Yes Related visit to physician within Katelyn last 2 weeks? ---No Does Katelyn PT have any chronic conditions? (i.e. diabetes, asthma, this includes High risk factors for pregnancy,  etc.) ---Yes List chronic conditions. ---breast cancer previously Is Katelyn patient pregnant or possibly pregnant? (Ask all females between Katelyn ages of 55-55) ---No Is this a behavioral health or substance abuse call? ---No Guidelines Guideline Title Affirmed Question Affirmed Notes Nurse Date/Time Lamount Cohen Time) Pelvic Pain - Female [1] MILDMODERATE pain AND [2] constant AND [3] present > 2 hours Shawnie Dapper, RN, Arline Asp 08/29/2023 2:55:12 PM Disp. Time Lamount Cohen Time) Disposition Final User 08/29/2023 2:58:26 PM See HCP within 4 Hours (or PCP triage) Yes Shawnie Dapper, RN, Arline Asp Final Disposition 08/29/2023 2:58:26 PM See HCP within 4 Hours (or PCP triage) Yes Shawnie Dapper, RN, Ave Filter Disagree/Comply Disagree Caller Understands Yes PreDisposition Call Doctor Care Advice Given Per Guideline SEE HCP (OR PCP TRIAGE) WITHIN 4 HOURS: * IF OFFICE WILL BE OPEN: You need to be seen within Katelyn next 3 or 4 hours. Call your doctor (or NP/PA) now or as soon as Katelyn office opens. CARE ADVICE given per Pelvic Pain - Female (Adult) guideline. Comments User: Christy Gentles, RN Date/Time Lamount Cohen Time): 08/29/2023 2:58:57 PM Warm transferred patient to appointment line, patient states that she cannot be seen today. Referrals REFERRED TO PCP OFFICE

## 2023-08-29 NOTE — Telephone Encounter (Signed)
FYI: This call has been transferred to triage nurse: Access Nurse. Once the result note has been entered staff can address the message at that time.  Patient called in with the following symptoms:  Red Word:abdominal pain   Please advise at Mobile [phone] (mobile)  Message is routed to Provider Pool.

## 2023-08-29 NOTE — Telephone Encounter (Signed)
FYI for tomorrow

## 2023-08-29 NOTE — Progress Notes (Deleted)
  Cardiology Office Note:  .   Date:  08/29/2023  ID:  Katelyn Shelton, DOB [birth year], MRN [id number] PCP: Shelva Majestic, MD  [facility] Providers Cardiologist:  None { Click to update primary MD,subspecialty MD or APP then REFRESH:1}   Patient Profile: .      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 Delton See 03/2018 for hyperlipidemia and history of breast cancer. Diagnosed with metastatic breast cancer in the right breast, estrogen positive in 2018. She was started on anastrozole (Arimidex) in December 2019, ovarian suppression started October 2018 with monthly goserelin (Zoladex), bone density 11/25/2017 shows osteopenia with t score -2.0 and started on Prolia for osteoporosis since March 2019.  Family history of hyperlipidemia in her mother. Maternal grandfather had MI, he was a smoker. History of known hyperlipidemia since at least 2010.        History of Present Illness: [name]   Katelyn Shelton is a *** 47 y.o. female who is here today   Discussed the use of AI scribe software for clinical note transcription with the patient, who gave verbal consent to proceed.   ROS: ***       Studies Reviewed: .        *** Risk Assessment/Calculations:   {Does this patient have ATRIAL FIBRILLATION?:[id number]} No BP recorded.  {Refresh Note OR Click here to enter BP  :1}***       Physical Exam:   VS:  LMP 09/01/2017    Wt Readings from Last 3 Encounters:  07/12/23 117 lb (53.1 kg)  07/08/23 116 lb 9.6 oz (52.9 kg)  06/13/23 117 lb (53.1 kg)    GEN: Well nourished, well developed in no acute distress NECK: No JVD; No carotid bruits CARDIAC: ***RRR, no murmurs, rubs,  gallops RESPIRATORY:  Clear to auscultation without rales, wheezing or rhonchi  ABDOMEN: Soft, non-tender, non-distended EXTREMITIES:  No edema; No deformity     ASSESSMENT AND PLAN: .    Hyperlipidemia     {Are you ordering a CV Procedure (e.g. stress test, cath, DCCV, TEE, etc)?   Press F2        :[id number]}  Dispo: ***  Signed, Eligha Bridegroom, NP-C

## 2023-08-29 NOTE — Telephone Encounter (Signed)
Nira Conn is able to evaluate her.  We will respect her decision about waiting until tomorrow

## 2023-08-30 ENCOUNTER — Ambulatory Visit: Payer: Commercial Managed Care - PPO | Admitting: Family

## 2023-08-30 NOTE — Progress Notes (Deleted)
Patient ID: Katelyn Shelton, female    DOB: 11-26-1975, 47 y.o.   MRN: [id number]  No chief complaint on file. Seen by UC yesterday - UTI neg. - colonoscopy in September and has been having abdominal pain. Caller states that she noticed dull aching pain. Caller states that she is on Keflex (skin staph infection) now. Caller states that she did have a pelvic scan in July and it was normal. Caller states that she has pelvic pain is 1-2/10 and has been more constant recently.  Assessment & Plan:     Subjective:    Outpatient Medications Prior to Visit  Medication Sig Dispense Refill   ALPRAZolam (XANAX) 0.25 MG tablet Take 1 tablet (0.25 mg total) by mouth 2 (two) times daily as needed for anxiety. 60 tablet 0   buPROPion (WELLBUTRIN XL) 150 MG 24 hr tablet Take 1 tablet (150 mg total) by mouth daily. 90 tablet 3   buPROPion (WELLBUTRIN) 75 MG tablet Take 1 tablet (75 mg total) by mouth daily. 30 tablet 1   busPIRone (BUSPAR) 7.5 MG tablet Take 1 tablet (7.5 mg total) by mouth 2 (two) times daily. 60 tablet 1   Calcium Carbonate (CALCIUM 600 PO) Take 1 capsule by mouth daily.     cephALEXin (KEFLEX) 500 MG capsule Take 1 capsule (500 mg total) by mouth 3 (three) times daily. 30 capsule 0   Evolocumab (REPATHA SURECLICK) 140 MG/ML SOAJ Inject 140 mg (1 pen) into the skin every 14 (fourteen) days. (Patient taking differently: Inject 140 mg into the skin every 14 (fourteen) days.) 2 mL 11   Evolocumab (REPATHA SURECLICK) 140 MG/ML SOAJ Inject 140 mg into the skin every 14 (fourteen) days. 1 mL 0   exemestane (AROMASIN) 25 MG tablet Take 1 tablet (25 mg total) by mouth daily after breakfast. 90 tablet 3   gabapentin (NEURONTIN) 250 MG/5ML solution Take 0.5-1 mLs (25-50 mg total) by mouth at bedtime. 30 mL 1   ibandronate (BONIVA) 150 MG tablet Take in the morning every 28 days; take with a full glass of water, on an empty stomach, and do not take anything else by mouth or lie down for the next 60  min. 4 tablet 4   ISOtretinoin (ACCUTANE) 40 MG capsule Take 1 capsule (40 mg total) by mouth daily with a fatty meal. 30 capsule 0   ISOtretinoin (ZENATANE) 40 MG capsule Take 1 capsule (40 mg total) by mouth 2 (two) times daily.     lidocaine (LIDODERM) 5 % Place 1 patch onto the skin daily. (MAY WEAR UP TO 12HOURS) (Patient not taking: Reported on 06/13/2023) 30 patch 0   meloxicam (MOBIC) 15 MG tablet Take 1 tablet (15 mg total) by mouth daily with a meal (Patient not taking: Reported on 06/13/2023) 30 tablet 0   methocarbamol (ROBAXIN) 500 MG tablet Take 1 tablet (500 mg total) by mouth at bedtime as needed. (Patient not taking: Reported on 06/13/2023) 30 tablet 1   Multiple Vitamins-Minerals (MULTIVITAMIN WOMEN 50+) TABS Take 1 tablet by mouth daily.     mupirocin ointment (BACTROBAN) 2 % Apply a thin layer inside each nostrol 2 (two) times daily for 5 days. 22 g 0   Ospemifene (OSPHENA) 60 MG TABS Take 1 tablet (60 mg total) by mouth daily. 90 tablet 3   tazarotene (AVAGE) 0.1 % cream Apply a pea size amount to face topically at night as tolerated (Patient not taking: Reported on 06/13/2023) 30 g 11   tirzepatide (ZEPBOUND) 15  MG/0.5ML Pen Inject 15 mg into the skin once a week. 2 mL 11   No facility-administered medications prior to visit.   Past Medical History:  Diagnosis Date   Allergy    Anxiety    venflafaxine XR 75 mg   Breast cancer (HCC) 01/2017   right; genetic testing negative in 2018 with [employer] panel   Heart murmur    as child- went away   History of MRSA infection    elbow   Hyperlipidemia    Infection of breast implant (HCC) 01/19/2021   Joint pain    due to medicine induced joint pain   Migraine    otc med prn   Miscarriage    x1   MRSA infection 01/28/2021   Nausea and vomiting 01/19/2021   Neuromuscular disorder (HCC)    neuropathy bilater feet - ? r/t chemo   Osteopenia    UTI (urinary tract infection)    Past Surgical History:  Procedure Laterality Date    AREOLA/NIPPLE RECONSTRUCTION WITH GRAFT Right 12/01/2018   Procedure: RIGHT NIPPLE AREOLA COMPLEX RECONSTRUCTION WITH FULL THICKNESS SKIN GRAFT FROM RIGHT GROIN;  Surgeon: Glenna Fellows, MD;  Location: [facility];  Service: Plastics;  Laterality: Right;   BREAST IMPLANT REMOVAL Right 01/20/2021   Procedure: REMOVAL RIGHT BREAST IMPLANT;  Surgeon: Glenna Fellows, MD;  Location: [facility];  Service: Plastics;  Laterality: Right;   BREAST IMPLANT REMOVAL Left 03/02/2021   Procedure: REMOVAL LEFT CHEST IMPLANT AND CAPSULECTOMY;  Surgeon: Glenna Fellows, MD;  Location: [facility];  Service: Plastics;  Laterality: Left;   BREAST RECONSTRUCTION Left 12/01/2018   Procedure: LEFT BREAST REVISION RECONSTRUCTION WITH SKIN EXCISION;  Surgeon: Glenna Fellows, MD;  Location: [facility];  Service: Plastics;  Laterality: Left;   BREAST RECONSTRUCTION Left 12/01/2018   Procedure: COMPOSITE GRAFT FROM LEFT NIPPLE (NIPPLE SHARING);  Surgeon: Glenna Fellows, MD;  Location: [facility];  Service: Plastics;  Laterality: Left;   BREAST RECONSTRUCTION WITH PLACEMENT OF TISSUE EXPANDER AND FLEX HD (ACELLULAR HYDRATED DERMIS) Bilateral 02/28/2017   Procedure: BILATERAL BREAST RECONSTRUCTION WITH PLACEMENT OF TISSUE EXPANDER AND ALLODERM;  Surgeon: Glenna Fellows, MD;  Location: [facility];  Service: Plastics;  Laterality: Bilateral;   CESAREAN SECTION  2008,2010,2013   x 3   CESAREAN SECTION  02/04/2012   Procedure: CESAREAN SECTION;  Surgeon: Jeani Hawking, MD;  Location: [facility];  Service: Gynecology;  Laterality: N/A;  Repeat Cesarean Section Delivery  Boy  @  [phone], Apgars 9/10   COLONOSCOPY  2019   KN-MAC-suprep(exc)-hems/inflammatory polyp   DILATION AND CURETTAGE OF UTERUS     EXCISION OF BREAST LESION Right 03/15/2017   Procedure: RIGHT NIPPLE AND AREOLA EXCISION;  Surgeon: Emelia Loron, MD;  Location: [facility];  Service: General;  Laterality: Right;   LAPAROSCOPIC VAGINAL HYSTERECTOMY WITH SALPINGO OOPHORECTOMY Bilateral 06/29/2018   Procedure: LAPAROSCOPIC ASSISTED VAGINAL HYSTERECTOMY WITH SALPINGO OOPHORECTOMY;  Surgeon: Candice Camp, MD;  Location: [facility];  Service: Gynecology;  Laterality: Bilateral;  Request case for [facility] CRNA   LIPOSUCTION WITH LIPOFILLING N/A 07/25/2018   Procedure: LIPOSUCTION WITH LIPOFILLING FROM ABDOMEN TO CHEST;  Surgeon: Glenna Fellows, MD;  Location: [facility];  Service: Plastics;  Laterality: N/A;   LIPOSUCTION WITH LIPOFILLING Bilateral 12/01/2018   Procedure: LIPOFILLING TO BILATERAL CHEST REVISION;  Surgeon: Glenna Fellows, MD;  Location: [facility];  Service: Plastics;  Laterality: Bilateral;   NIPPLE SPARING MASTECTOMY  Bilateral 02/28/2017   Procedure: RIGHT NIPPLE SPARING MASTECTOMY; LEFT PROPHYLACTIC NIPPLE SPARING MASTECTOMY;  Surgeon: Emelia Loron, MD;  Location: [facility];  Service: General;  Laterality: Bilateral;   PECTUS EXCAVATUM REPAIR  6814   47 years old- very large implant   REMOVAL OF TISSUE EXPANDER AND PLACEMENT OF IMPLANT Bilateral 07/25/2018   Procedure: REMOVAL OF TISSUE EXPANDER AND PLACEMENT OF SILICONE IMPLANT;  Surgeon: Glenna Fellows, MD;  Location: [facility];  Service: Plastics;  Laterality: Bilateral;   SENTINEL NODE BIOPSY Right 02/28/2017   Procedure: RIGHT AXILLARY SENTINEL LYMPH  NODE BIOPSY;  Surgeon: Emelia Loron, MD;  Location: [facility];  Service: General;  Laterality: Right;   VULVAR LESION REMOVAL     WISDOM TOOTH EXTRACTION     Allergies  Allergen Reactions   Fentanyl Itching    Severe itching - Patient refuses this medication   Peach Flavor Anaphylaxis   Penicillins Hives    AS A CHILD   Sulfa Antibiotics Nausea And Vomiting   Sulfasalazine Nausea And Vomiting and Other (See Comments)   Latex Itching       Objective:    Physical Exam Vitals and nursing note reviewed.  Constitutional:      Appearance: Normal appearance.  Cardiovascular:     Rate and Rhythm: Normal rate and regular rhythm.  Pulmonary:     Effort: Pulmonary effort is normal.     Breath sounds: Normal breath sounds.  Musculoskeletal:        General: Normal range of motion.  Skin:    General: Skin is warm and dry.  Neurological:     Mental Status: She is alert.  Psychiatric:        Mood and Affect: Mood normal.        Behavior: Behavior normal.    LMP 09/01/2017  Wt Readings from Last 3 Encounters:  07/12/23 117 lb (53.1 kg)  07/08/23 116 lb 9.6 oz (52.9 kg)  06/13/23 117 lb (53.1 kg)       Dulce Sellar, NP

## 2023-08-30 NOTE — Telephone Encounter (Signed)
Patient called to cancel appointment 08/30/23 with Dulce Sellar. Declined to reschedule at this time. Stated she will call back to reschedule appointment.

## 2023-08-31 ENCOUNTER — Other Ambulatory Visit (HOSPITAL_COMMUNITY): Payer: Self-pay

## 2023-08-31 ENCOUNTER — Other Ambulatory Visit: Payer: Self-pay

## 2023-09-02 ENCOUNTER — Ambulatory Visit: Payer: Commercial Managed Care - PPO | Admitting: Nurse Practitioner

## 2023-09-02 ENCOUNTER — Other Ambulatory Visit (HOSPITAL_COMMUNITY): Payer: Self-pay

## 2023-09-05 ENCOUNTER — Other Ambulatory Visit (HOSPITAL_COMMUNITY): Payer: Self-pay

## 2023-09-12 ENCOUNTER — Other Ambulatory Visit (HOSPITAL_COMMUNITY): Payer: Self-pay

## 2023-09-12 DIAGNOSIS — F410 Panic disorder [episodic paroxysmal anxiety] without agoraphobia: Secondary | ICD-10-CM | POA: Diagnosis not present

## 2023-09-12 DIAGNOSIS — G4700 Insomnia, unspecified: Secondary | ICD-10-CM | POA: Diagnosis not present

## 2023-09-12 DIAGNOSIS — F411 Generalized anxiety disorder: Secondary | ICD-10-CM | POA: Diagnosis not present

## 2023-09-12 MED ORDER — TRAZODONE HCL 50 MG PO TABS
25.0000 mg | ORAL_TABLET | Freq: Every evening | ORAL | 1 refills | Status: DC | PRN
  Filled 2023-09-12: qty 30, 30d supply, fill #0
  Filled 2023-10-03 – 2023-10-06 (×2): qty 30, 30d supply, fill #1

## 2023-09-12 MED ORDER — BUPROPION HCL 75 MG PO TABS
75.0000 mg | ORAL_TABLET | Freq: Every day | ORAL | 1 refills | Status: DC
  Filled 2023-09-12 – 2023-10-06 (×2): qty 90, 90d supply, fill #0
  Filled 2023-12-27 – 2024-01-27 (×6): qty 90, 90d supply, fill #1

## 2023-09-12 MED ORDER — GABAPENTIN 250 MG/5ML PO SOLN
25.0000 mg | Freq: Every day | ORAL | 1 refills | Status: DC
  Filled 2023-09-12 – 2023-09-28 (×2): qty 30, 30d supply, fill #0
  Filled 2023-10-28 – 2023-11-11 (×3): qty 30, 30d supply, fill #1

## 2023-09-16 ENCOUNTER — Other Ambulatory Visit (HOSPITAL_COMMUNITY): Payer: Self-pay

## 2023-09-19 ENCOUNTER — Other Ambulatory Visit (HOSPITAL_COMMUNITY): Payer: Self-pay

## 2023-09-19 ENCOUNTER — Encounter (HOSPITAL_COMMUNITY): Payer: Self-pay

## 2023-09-19 ENCOUNTER — Other Ambulatory Visit: Payer: Self-pay

## 2023-09-19 DIAGNOSIS — L700 Acne vulgaris: Secondary | ICD-10-CM | POA: Diagnosis not present

## 2023-09-19 DIAGNOSIS — Z79899 Other long term (current) drug therapy: Secondary | ICD-10-CM | POA: Diagnosis not present

## 2023-09-19 MED ORDER — ISOTRETINOIN 40 MG PO CAPS
40.0000 mg | ORAL_CAPSULE | Freq: Every day | ORAL | 0 refills | Status: DC
  Filled 2023-09-19: qty 30, 30d supply, fill #0

## 2023-09-23 ENCOUNTER — Other Ambulatory Visit: Payer: Self-pay

## 2023-09-23 ENCOUNTER — Other Ambulatory Visit (HOSPITAL_COMMUNITY): Payer: Self-pay

## 2023-09-28 ENCOUNTER — Other Ambulatory Visit (HOSPITAL_COMMUNITY): Payer: Self-pay

## 2023-10-03 ENCOUNTER — Other Ambulatory Visit (HOSPITAL_COMMUNITY): Payer: Self-pay

## 2023-10-06 ENCOUNTER — Other Ambulatory Visit: Payer: Self-pay

## 2023-10-06 ENCOUNTER — Other Ambulatory Visit (HOSPITAL_COMMUNITY): Payer: Self-pay

## 2023-10-10 ENCOUNTER — Other Ambulatory Visit (HOSPITAL_COMMUNITY): Payer: Self-pay

## 2023-10-12 ENCOUNTER — Other Ambulatory Visit (HOSPITAL_COMMUNITY): Payer: Self-pay

## 2023-10-13 ENCOUNTER — Other Ambulatory Visit (HOSPITAL_COMMUNITY): Payer: Self-pay

## 2023-10-14 ENCOUNTER — Other Ambulatory Visit (HOSPITAL_COMMUNITY): Payer: Self-pay

## 2023-10-21 ENCOUNTER — Other Ambulatory Visit (HOSPITAL_COMMUNITY): Payer: Self-pay

## 2023-10-21 DIAGNOSIS — L700 Acne vulgaris: Secondary | ICD-10-CM | POA: Diagnosis not present

## 2023-10-21 DIAGNOSIS — Z79899 Other long term (current) drug therapy: Secondary | ICD-10-CM | POA: Diagnosis not present

## 2023-10-21 MED ORDER — ISOTRETINOIN 40 MG PO CAPS
40.0000 mg | ORAL_CAPSULE | Freq: Every day | ORAL | 0 refills | Status: DC
  Filled 2023-10-21: qty 30, 30d supply, fill #0

## 2023-10-24 ENCOUNTER — Other Ambulatory Visit (HOSPITAL_COMMUNITY): Payer: Self-pay

## 2023-10-25 ENCOUNTER — Other Ambulatory Visit (HOSPITAL_COMMUNITY): Payer: Self-pay

## 2023-10-25 ENCOUNTER — Other Ambulatory Visit: Payer: Self-pay

## 2023-10-27 ENCOUNTER — Other Ambulatory Visit (HOSPITAL_COMMUNITY): Payer: Self-pay

## 2023-10-28 ENCOUNTER — Other Ambulatory Visit (HOSPITAL_COMMUNITY): Payer: Self-pay

## 2023-10-30 ENCOUNTER — Other Ambulatory Visit: Payer: Self-pay

## 2023-10-31 ENCOUNTER — Other Ambulatory Visit (HOSPITAL_COMMUNITY): Payer: Self-pay

## 2023-11-07 ENCOUNTER — Other Ambulatory Visit (HOSPITAL_COMMUNITY): Payer: Self-pay

## 2023-11-07 DIAGNOSIS — F410 Panic disorder [episodic paroxysmal anxiety] without agoraphobia: Secondary | ICD-10-CM | POA: Diagnosis not present

## 2023-11-07 DIAGNOSIS — G4700 Insomnia, unspecified: Secondary | ICD-10-CM | POA: Diagnosis not present

## 2023-11-07 DIAGNOSIS — F411 Generalized anxiety disorder: Secondary | ICD-10-CM | POA: Diagnosis not present

## 2023-11-07 MED ORDER — TRAZODONE HCL 50 MG PO TABS
25.0000 mg | ORAL_TABLET | Freq: Every evening | ORAL | 1 refills | Status: DC | PRN
  Filled 2023-11-07: qty 90, 90d supply, fill #0
  Filled 2024-02-17 (×2): qty 90, 90d supply, fill #1

## 2023-11-07 MED ORDER — BUPROPION HCL 75 MG PO TABS
75.0000 mg | ORAL_TABLET | Freq: Every day | ORAL | 1 refills | Status: DC
  Filled 2023-11-07: qty 90, 90d supply, fill #0

## 2023-11-10 ENCOUNTER — Other Ambulatory Visit (HOSPITAL_COMMUNITY): Payer: Self-pay

## 2023-11-11 NOTE — Progress Notes (Deleted)
  Cardiology Office Note:  .   Date:  11/11/2023  ID:  Katelyn Shelton, DOB Jan 15, 1976, MRN [id number] PCP: Shelva Majestic, MD  [facility] Providers Cardiologist:  None { Click to update primary MD,subspecialty MD or APP then REFRESH:1}   Patient Profile: .      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 Delton See 03/2018 for hyperlipidemia and history of breast cancer.  She was diagnosed with metastatic breast cancer in the right breast, estrogen positive in 2018.  She was started on anastrozole (Arimidex) in December 2019, ovarian suppression started October 2018 with monthly goserelin (Zoladex), bone density 11/25/2017 shows osteopenia with t-score -2.0 and she was started on Prolia for osteoporosis March 2019. CT of pelvis April 2019 with bilateral common iliac artery atherosclerotic calcification. She had bilateral breast implants explanted 03/02/2021 due to right breast cellulitis with fevers and erythema. Cultures were consistent with MRSA  Family history of hyperlipidemia in her mother.  Maternal grandfather had MI, he was a smoker.  History of known hyperlipidemia since at least 2010.  Previous intolerance to Crestor with significant neuropathy, Livalo caused burning to her thigh.  She was started on Repatha in 2022.   Last cardiology clinic visit was with me on 06/11/2022.  She was on Accutane and was concerned about recent elevation in triglycerides near 300.  We discussed 5% cardiotoxicity rate with anastrozole.  Echocardiogram was completed 07/27/2022 to ensure no change in heart function.  Echo revealed normal LVEF 55 to 60%, no RWMA, normal diastolic parameters, normal RV, no  significant valve disease.  She was frustrated by difficulty losing weight and was started on GLP-1 agonist therapy.       History of Present Illness: Katelyn Shelton   Katelyn Shelton is a *** 48 y.o. female ***   Discussed the use of AI scribe software for clinical note transcription with the patient, who gave verbal consent to proceed.   ROS: ***       Studies Reviewed: .        *** Risk Assessment/Calculations:   {Does this patient have ATRIAL FIBRILLATION?:[phone]} No BP recorded.  {Refresh Note OR Click here to enter BP  :1}***       Physical Exam:   VS:  LMP 09/01/2017    Wt Readings from Last 3 Encounters:  07/12/23 117 lb (53.1 kg)  07/08/23 116 lb 9.6 oz (52.9 kg)  06/13/23 117 lb (53.1 kg)    GEN: Well nourished, well developed in no acute distress NECK: No JVD; No carotid bruits CARDIAC: ***RRR, no murmurs, rubs, gallops RESPIRATORY:  Clear to auscultation without rales, wheezing or rhonchi  ABDOMEN: Soft, non-tender, non-distended EXTREMITIES:  No edema; No deformity     ASSESSMENT AND PLAN: .    Hyperlipidemia LDL goal < 70: Lipid panel completed 07/08/23 with total cholesterol 112, triglycerides 133, HDL 45, and LDL 40. Breast cancer s/p cardiotoxic chemotherapy: PAD: Calcification of bilateral iliac arteries identified on CT of pelvis 2019.    {Are you ordering a CV Procedure (e.g. stress test, cath, DCCV, TEE, etc)?   Press F2        :[id number]}  Dispo: ***  Signed, Eligha Bridegroom, NP-C

## 2023-11-17 ENCOUNTER — Ambulatory Visit: Payer: Commercial Managed Care - PPO | Attending: Nurse Practitioner | Admitting: Nurse Practitioner

## 2023-11-21 ENCOUNTER — Other Ambulatory Visit (HOSPITAL_COMMUNITY): Payer: Self-pay

## 2023-11-22 ENCOUNTER — Other Ambulatory Visit (HOSPITAL_COMMUNITY): Payer: Self-pay

## 2023-11-22 DIAGNOSIS — Z79899 Other long term (current) drug therapy: Secondary | ICD-10-CM | POA: Diagnosis not present

## 2023-11-22 DIAGNOSIS — L2089 Other atopic dermatitis: Secondary | ICD-10-CM | POA: Diagnosis not present

## 2023-11-22 DIAGNOSIS — L700 Acne vulgaris: Secondary | ICD-10-CM | POA: Diagnosis not present

## 2023-11-22 MED ORDER — ISOTRETINOIN 40 MG PO CAPS
40.0000 mg | ORAL_CAPSULE | Freq: Every day | ORAL | 0 refills | Status: DC
  Filled 2023-11-22: qty 30, 30d supply, fill #0

## 2023-11-23 ENCOUNTER — Other Ambulatory Visit (HOSPITAL_COMMUNITY): Payer: Self-pay

## 2023-12-05 ENCOUNTER — Other Ambulatory Visit (HOSPITAL_COMMUNITY): Payer: Self-pay

## 2023-12-08 ENCOUNTER — Other Ambulatory Visit (HOSPITAL_COMMUNITY): Payer: Self-pay

## 2023-12-22 ENCOUNTER — Ambulatory Visit (INDEPENDENT_AMBULATORY_CARE_PROVIDER_SITE_OTHER): Payer: Commercial Managed Care - PPO | Admitting: Family Medicine

## 2023-12-22 ENCOUNTER — Encounter: Payer: Self-pay | Admitting: Family Medicine

## 2023-12-22 VITALS — BP 100/70 | HR 79 | Temp 98.2°F | Ht 61.0 in | Wt 113.0 lb

## 2023-12-22 DIAGNOSIS — E7849 Other hyperlipidemia: Secondary | ICD-10-CM | POA: Diagnosis not present

## 2023-12-22 DIAGNOSIS — Z0000 Encounter for general adult medical examination without abnormal findings: Secondary | ICD-10-CM

## 2023-12-22 DIAGNOSIS — F411 Generalized anxiety disorder: Secondary | ICD-10-CM | POA: Diagnosis not present

## 2023-12-22 DIAGNOSIS — Z170 Estrogen receptor positive status [ER+]: Secondary | ICD-10-CM | POA: Diagnosis not present

## 2023-12-22 DIAGNOSIS — C50411 Malignant neoplasm of upper-outer quadrant of right female breast: Secondary | ICD-10-CM | POA: Diagnosis not present

## 2023-12-22 LAB — TSH: TSH: 0.84 u[IU]/mL (ref 0.35–5.50)

## 2023-12-22 LAB — CBC WITH DIFFERENTIAL/PLATELET
Basophils Absolute: 0.1 10*3/uL (ref 0.0–0.1)
Basophils Relative: 1.3 % (ref 0.0–3.0)
Eosinophils Absolute: 0.1 10*3/uL (ref 0.0–0.7)
Eosinophils Relative: 2.1 % (ref 0.0–5.0)
HCT: 42.9 % (ref 36.0–46.0)
Hemoglobin: 14.3 g/dL (ref 12.0–15.0)
Lymphocytes Relative: 32.1 % (ref 12.0–46.0)
Lymphs Abs: 1.6 10*3/uL (ref 0.7–4.0)
MCHC: 33.4 g/dL (ref 30.0–36.0)
MCV: 96.3 fL (ref 78.0–100.0)
Monocytes Absolute: 0.4 10*3/uL (ref 0.1–1.0)
Monocytes Relative: 9 % (ref 3.0–12.0)
Neutro Abs: 2.7 10*3/uL (ref 1.4–7.7)
Neutrophils Relative %: 55.5 % (ref 43.0–77.0)
Platelets: 253 10*3/uL (ref 150.0–400.0)
RBC: 4.46 Mil/uL (ref 3.87–5.11)
RDW: 13.1 % (ref 11.5–15.5)
WBC: 4.9 10*3/uL (ref 4.0–10.5)

## 2023-12-22 LAB — COMPREHENSIVE METABOLIC PANEL
ALT: 18 U/L (ref 0–35)
AST: 23 U/L (ref 0–37)
Albumin: 4.5 g/dL (ref 3.5–5.2)
Alkaline Phosphatase: 34 U/L — ABNORMAL LOW (ref 39–117)
BUN: 16 mg/dL (ref 6–23)
CO2: 28 meq/L (ref 19–32)
Calcium: 9.1 mg/dL (ref 8.4–10.5)
Chloride: 104 meq/L (ref 96–112)
Creatinine, Ser: 0.69 mg/dL (ref 0.40–1.20)
GFR: 103.82 mL/min (ref >60.00)
Glucose, Bld: 81 mg/dL (ref 70–99)
Potassium: 4 meq/L (ref 3.5–5.1)
Sodium: 140 meq/L (ref 135–145)
Total Bilirubin: 0.5 mg/dL (ref 0.2–1.2)
Total Protein: 7.8 g/dL (ref 6.0–8.3)

## 2023-12-22 LAB — LIPID PANEL
Cholesterol: 109 mg/dL (ref 0–200)
HDL: 49.5 mg/dL (ref >39.00)
LDL Cholesterol: 27 mg/dL (ref 0–99)
NonHDL: 59.72
Total CHOL/HDL Ratio: 2
Triglycerides: 165 mg/dL — ABNORMAL HIGH (ref 0.0–149.0)
VLDL: 33 mg/dL (ref 0.0–40.0)

## 2023-12-22 NOTE — Progress Notes (Signed)
 Phone [phone]   Subjective:  Patient presents today for their annual physical. Chief complaint-noted.   See problem oriented charting- ROS- full  review of systems was completed and negative except for: ongoing anxiety and down mood at times  The following were reviewed and entered/updated in epic: Past Medical History:  Diagnosis Date   Allergy    Anxiety    venflafaxine XR 75 mg   Breast cancer (HCC) 01/2017   right; genetic testing negative in 2018 with [employer] panel   Heart murmur    as child- went away   History of MRSA infection    elbow   Hyperlipidemia    Infection of breast implant (HCC) 01/19/2021   Joint pain    due to medicine induced joint pain   Migraine    otc med prn   Miscarriage    x1   MRSA infection 01/28/2021   Nausea and vomiting 01/19/2021   Neuromuscular disorder (HCC)    neuropathy bilater feet - ? 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female, estrogen receptor positive (HCC) 02/11/2017    Priority: High   Hyperlipidemia, familial, high LDL 01/16/2010    Priority: High   Osteopenia of lumbar spine 05/28/2020    Priority: Medium    Aromatase inhibitor-associated arthralgia 02/24/2018    Priority: Medium    Enchondroma of bone 02/24/2018    Priority: Medium    GAD (generalized anxiety disorder)     Priority: Medium    Family history of diabetes mellitus 11/24/2021    Priority: Low   MRSA infection 01/28/2021    Priority: Low   Cellulitis of breast 12/06/2019    Priority: Low   History of total hysterectomy 06/29/2018    Priority: Low   Family history of colonic polyps 02/24/2018    Priority: Low   Genetic testing 02/21/2017    Priority: Low   Family history of stomach cancer     Priority: Low   Lesion of lung 04/05/2018   Past Surgical History:  Procedure Laterality Date   AREOLA/NIPPLE  RECONSTRUCTION WITH GRAFT Right 12/01/2018   Procedure: RIGHT NIPPLE AREOLA COMPLEX RECONSTRUCTION WITH FULL THICKNESS SKIN GRAFT FROM RIGHT GROIN;  Surgeon: Glenna Fellows, MD;  Location: [facility];  Service: Plastics;  Laterality: Right;   BREAST IMPLANT REMOVAL Right 01/20/2021   Procedure: REMOVAL RIGHT BREAST IMPLANT;  Surgeon: Glenna Fellows, MD;  Location: [facility];  Service: Plastics;  Laterality: Right;   BREAST IMPLANT REMOVAL Left 03/02/2021   Procedure: REMOVAL LEFT CHEST IMPLANT AND CAPSULECTOMY;  Surgeon: Glenna Fellows, MD;  Location: [facility];  Service: Plastics;  Laterality: Left;   BREAST RECONSTRUCTION Left 12/01/2018   Procedure: LEFT BREAST REVISION RECONSTRUCTION WITH SKIN EXCISION;  Surgeon: Glenna Fellows, MD;  Location: [facility];  Service: Plastics;  Laterality: Left;   BREAST RECONSTRUCTION Left 12/01/2018   Procedure: COMPOSITE GRAFT FROM LEFT NIPPLE (NIPPLE SHARING);  Surgeon: Glenna Fellows, MD;  [facility];  Service: Plastics;  Laterality: Left;   BREAST RECONSTRUCTION WITH PLACEMENT OF TISSUE EXPANDER AND FLEX HD (ACELLULAR HYDRATED DERMIS) Bilateral 02/28/2017   Procedure: BILATERAL BREAST RECONSTRUCTION WITH PLACEMENT OF TISSUE EXPANDER AND ALLODERM;  Surgeon: Glenna Fellows, MD;  Location: [facility];  Service: Plastics;  Laterality: Bilateral;   CESAREAN SECTION  2008,2010,2013  x 3   CESAREAN SECTION  02/04/2012   Procedure: CESAREAN SECTION;  Surgeon: Jeani Hawking, MD;  Location: [facility];  Service: Gynecology;  Laterality: N/A;  Repeat Cesarean Section Delivery  Boy  @  [phone], Apgars 9/10   COLONOSCOPY  2019   KN-MAC-suprep(exc)-hems/inflammatory polyp   DILATION AND CURETTAGE OF UTERUS     EXCISION OF BREAST LESION Right 03/15/2017   Procedure: RIGHT NIPPLE AND AREOLA EXCISION;  Surgeon: Emelia Loron, MD;  Location: [facility];   Service: General;  Laterality: Right;   LAPAROSCOPIC VAGINAL HYSTERECTOMY WITH SALPINGO OOPHORECTOMY Bilateral 06/29/2018   Procedure: LAPAROSCOPIC ASSISTED VAGINAL HYSTERECTOMY WITH SALPINGO OOPHORECTOMY;  Surgeon: Candice Camp, MD;  Location: [facility];  Service: Gynecology;  Laterality: Bilateral;  Request case for [facility] CRNA   LIPOSUCTION WITH LIPOFILLING N/A 07/25/2018   Procedure: LIPOSUCTION WITH LIPOFILLING FROM ABDOMEN TO CHEST;  Surgeon: Glenna Fellows, MD;  Location: [facility];  Service: Plastics;  Laterality: N/A;   LIPOSUCTION WITH LIPOFILLING Bilateral 12/01/2018   Procedure: LIPOFILLING TO BILATERAL CHEST REVISION;  Surgeon: Glenna Fellows, MD;  Location: [facility];  Service: Plastics;  Laterality: Bilateral;   NIPPLE SPARING MASTECTOMY Bilateral 02/28/2017   Procedure: RIGHT NIPPLE SPARING MASTECTOMY; LEFT PROPHYLACTIC NIPPLE SPARING MASTECTOMY;  Surgeon: Emelia Loron, MD;  Location: [facility];  Service: General;  Laterality: Bilateral;   PECTUS EXCAVATUM REPAIR  2847   48 years old- very large implant   REMOVAL OF TISSUE EXPANDER AND PLACEMENT OF IMPLANT Bilateral 07/25/2018   Procedure: REMOVAL OF TISSUE EXPANDER AND PLACEMENT OF SILICONE IMPLANT;  Surgeon: Glenna Fellows, MD;  Location: [facility];  Service: Plastics;  Laterality: Bilateral;   SENTINEL NODE BIOPSY Right 02/28/2017   Procedure: RIGHT AXILLARY SENTINEL LYMPH  NODE BIOPSY;  Surgeon: Emelia Loron, MD;  Location: [facility];  Service: General;  Laterality: Right;   VULVAR LESION REMOVAL     WISDOM TOOTH EXTRACTION      Family History  Problem Relation Age of Onset   Skin cancer Mother    Hyperlipidemia Mother    Hypertension Mother    Colon polyps Mother    Hyperlipidemia Father    Parkinson's disease Sister 41       early on set   Miscarriages / [place] Sister    Colon polyps Sister    Diabetes Maternal  Aunt    Colon polyps Maternal Uncle    Heart failure Maternal Grandmother    Stroke Maternal Grandmother    Heart attack Maternal Grandfather    Depression Maternal Grandfather    Early death Maternal Grandfather        81 MI   Stomach cancer Paternal Grandmother        alcohol use   Prostate cancer Paternal Grandfather    Alzheimer's disease Paternal Grandfather    Breast cancer Cousin        mother's paternal first FEMALE cousin   Colon cancer Neg Hx    Rectal cancer Neg Hx    Esophageal cancer Neg Hx     Medications- reviewed and updated Current Outpatient Medications  Medication Sig Dispense Refill   buPROPion (WELLBUTRIN) 75 MG tablet Take 1 tablet (75 mg total) by mouth daily. 90 tablet 1   Calcium Carbonate (CALCIUM 600 PO) Take 1 capsule by mouth daily.     Evolocumab (REPATHA SURECLICK) 140 MG/ML SOAJ Inject 140 mg (1 pen) into the skin every 14 (fourteen) days. (Patient taking  differently: Inject 140 mg into the skin every 14 (fourteen) days.) 2 mL 11   Evolocumab (REPATHA SURECLICK) 140 MG/ML SOAJ Inject 140 mg into the skin every 14 (fourteen) days. 1 mL 0   exemestane (AROMASIN) 25 MG tablet Take 1 tablet (25 mg total) by mouth daily after breakfast. 90 tablet 3   gabapentin (NEURONTIN) 250 MG/5ML solution Take 0.5-1 mLs (25-50 mg total) by mouth at bedtime. 30 mL 1   ibandronate (BONIVA) 150 MG tablet Take in the morning every 28 days; take with a full glass of water, on an empty stomach, and do not take anything else by mouth or lie down for the next 60 min. 4 tablet 4   ISOtretinoin (ACCUTANE) 40 MG capsule Take 1 capsule (40 mg total) by mouth daily with a fatty meal. 30 capsule 0   Multiple Vitamins-Minerals (MULTIVITAMIN WOMEN 50+) TABS Take 1 tablet by mouth daily.     Ospemifene (OSPHENA) 60 MG TABS Take 1 tablet (60 mg total) by mouth daily. 90 tablet 3   tazarotene (AVAGE) 0.1 % cream Apply a pea size amount to face topically at night as tolerated 30 g 11    tirzepatide (ZEPBOUND) 15 MG/0.5ML Pen Inject 15 mg into the skin once a week. 2 mL 11   traZODone (DESYREL) 50 MG tablet Take 1/2-1 tablet (25-50 mg total) by mouth at bedtime as needed for insomnia 90 tablet 1   lidocaine (LIDODERM) 5 % Place 1 patch onto the skin daily. (MAY WEAR UP TO 12HOURS) (Patient not taking: Reported on 12/22/2023) 30 patch 0   methocarbamol (ROBAXIN) 500 MG tablet Take 1 tablet (500 mg total) by mouth at bedtime as needed. (Patient not taking: Reported on 12/22/2023) 30 tablet 1   No current facility-administered medications for this visit.    Allergies-reviewed and updated Allergies  Allergen Reactions   Fentanyl Itching    Severe itching - Patient refuses this medication   Peach Flavoring Agent (Non-Screening) Anaphylaxis   Penicillins Hives    AS A CHILD   Sulfa Antibiotics Nausea And Vomiting   Sulfasalazine Nausea And Vomiting and Other (See Comments)   Latex Itching    Social History   Social History Narrative   Married to Dr. Shirlee Latch. 3 children.    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    Masters in public history at [employer]. Undergrad at [employer]: running, reading, exercise   Objective  Objective:  BP 100/70   Pulse 79   Temp 98.2 F (36.8 C)   Ht 5\' 1"  (1.549 m)   Wt 113 lb (51.3 kg)   LMP 09/01/2017   SpO2 98%   BMI 21.35 kg/m  Gen: NAD, resting comfortably HEENT: Mucous membranes are moist. Oropharynx normal Neck: no thyromegaly CV: RRR no murmurs rubs or gallops Lungs: CTAB no crackles, wheeze, rhonchi Abdomen: soft/nontender/nondistended/normal bowel sounds. No rebound or guarding.  Ext: no edema Skin: warm, dry Neuro: grossly normal, moves all extremities, PERRLA   Assessment and Plan   48 y.o. female presenting for annual physical.  Health Maintenance counseling: 1. Anticipatory guidance: Patient counseled regarding regular dental exams -q6 months, eye exams - yearly,   avoiding smoking and second hand smoke , limiting alcohol to 1 beverage per day- 2-3 a month max , no illicit drugs .   2. Risk factor reduction:  Advised patient of need for regular exercise and  diet rich and fruits and vegetables to reduce risk of heart attack and stroke.  Exercise-weight training twice a week with a professional and walks regularly outside of this- has maintained this.  Diet/weight management-long-term Reginal Lutes or Zepbound planned through cardiology-Down 7 pounds last year- feels has some room for improvement.  Wt Readings from Last 3 Encounters:  12/22/23 113 lb (51.3 kg)  07/12/23 117 lb (53.1 kg)  07/08/23 116 lb 9.6 oz (52.9 kg)  3. Immunizations/screenings/ancillary studies-up-to-date as holding off on further COVID-19 vaccinations  Immunization History  Administered Date(s) Administered   Influenza,inj,Quad PF,6+ Mos 09/17/2015, 09/16/2016, 06/21/2019, 08/08/2021, 08/13/2022, 07/10/2023   Influenza-Unspecified 07/03/2020   Moderna Covid-19 Fall Seasonal Vaccine 57yrs & older 08/13/2022   Moderna Covid-19 Vaccine Bivalent Booster 52yrs & up 07/09/2021   Moderna Sars-Covid-2 Vaccination 11/09/2019, 12/15/2019, 07/03/2020   Tdap 12/20/2022  4. Cervical cancer screening-  history of total hysterectomy including cervix per patient for benign reasons-has been told Pap smears not required.  She still follows with Dr. Rana Snare if needed- no recent issues- and did have Pap smear April 2018 prior to hysterectomy- mona lisa touch was not helpful.    5. Breast cancer follow up-  has regular follow up with oncology Dr. Pamelia Hoit- last visit 07/08/2023- 7 years anastrozole planned but now on aromasin-breast cancer stable/appropriately treated.   Breast reconstruction  12/22/22- stage I of 3 possibly 6. Colon cancer screening -colonoscopy 07/12/2023 with 5-year repeat planned . Soreness after colonoscopy for 3 months but resolved 7. Skin cancer screening- Dr. Doreen Beam regularly. advised regular  sunscreen use. Denies worrisome, changing, or new skin lesions- has been told acne rosacea- has used acutane- may do low dose long term every other day 8. Birth control/STD check- only active with husband and hysterectomy 9. Osteoporosis screening at 31- still on bonvia through oncology 09/27/22- very mild osteopenia with worst t score -1.2. taking calcium and vitmain D  10. Smoking associated screening - never smoker  Status of chronic or acute concerns   # breast reconstruction after breast cancer- did step 1 of 3 but had a lot of orthopedic issue (rested for 6 weeks and was challenging)- question of next step surgery in September but she is leaning away from this.   # Vaginal dryness-Osphena helpful   #hyperlipidemia-familial S: Medication: Repatha 410 mg every 2 weeks Lab Results  Component Value Date   CHOL 112 07/08/2023   HDL 45 07/08/2023   LDLCALC 40 07/08/2023   TRIG 133 07/08/2023   CHOLHDL 2.5 07/08/2023   A/P: looked great in march- update today and continue current medications   # Depression/anxiety/generalized anxiety disorder- seeing psychiatry now S: Medication:Wellbutrin 75 mg daily (helps with pain from aromasin) but has side effects from it- as well as gabapentin (not taking) and trazodone for sleep -fair amount of fatigue    12/22/2023   10:07 AM 12/20/2022   10:27 AM 12/08/2021    1:36 PM  Depression screen PHQ 2/9  Decreased Interest 1 0 0  Down, Depressed, Hopeless 1 0 1  PHQ - 2 Score 2 0 1  Altered sleeping 0 1 3  Tired, decreased energy 0 1 3  Change in appetite 0 0 0  Feeling bad or failure about yourself  0 1 0  Trouble concentrating 0 0 0  Moving slowly or fidgety/restless 0 0 0  Suicidal thoughts 0 0 0  PHQ-9 Score 2 3 7   Difficult doing work/chores Not difficult at all Not difficult at all Not difficult at  all      12/20/2022   10:27 AM 12/08/2021    2:51 PM 05/16/2018   11:59 AM  GAD 7 : Generalized Anxiety Score  Nervous, Anxious, on Edge 2  3 1   Control/stop worrying 1 3 1   Worry too much - different things 1 2 2   Trouble relaxing 0 0 0  Restless 0 0 0  Easily annoyed or irritable 0 1 0  Afraid - awful might happen 1 1 1   Total GAD 7 Score 5 10 5   Anxiety Difficulty Not difficult at all Somewhat difficult Not difficult at all  A/P: ongoing anxiety issues (felt too tired on effexor) - trying to find the right mix of medication- dealing with ongoing fatigue issues- check TSH -shed like to change medications but doesn't always have best experience with that either- discussed alternative therapy options as a possibility- she will consider- see avs    Recommended follow up: Return in about 1 year (around 12/21/2024) for physical or sooner if needed.Schedule b4 you leave. Future Appointments  Date Time Provider Department Center  01/16/2024 10:15 AM Serena Croissant, MD [facility] None   Lab/Order associations: fasting   ICD-10-CM   1. Preventative health care  Z00.00     2. Hyperlipidemia, familial, high LDL  E78.49     3. GAD (generalized anxiety disorder)  F41.1     4. Malignant neoplasm of upper-outer quadrant of right breast in female, estrogen receptor positive (HCC) Chronic C50.411    Z17.0       No orders of the defined types were placed in this encounter.   Return precautions advised.  Tana Conch, MD

## 2023-12-22 NOTE — Patient Instructions (Addendum)
 Might be worth considering alternative therapy such as Eye Movement Desensitization and Reprocessing (EMDR), brain spotting. Could also consider biofeedback  [email or web address] -one option Please stop by lab before you go If you have mychart- we will send your results within 3 business days of [place] receiving them.  If you do not have mychart- we will call you about results within 5 business days of [place] receiving them.  *please also note that you will see labs on mychart as soon as they post. I will later go in and write notes on them- will say "notes from Dr. Durene Cal"   Recommended follow up: Return in about 1 year (around 12/21/2024) for physical or sooner if needed.Schedule b4 you leave.

## 2023-12-27 ENCOUNTER — Encounter: Payer: Self-pay | Admitting: Hematology and Oncology

## 2023-12-27 ENCOUNTER — Encounter: Payer: Self-pay | Admitting: Family Medicine

## 2023-12-27 ENCOUNTER — Other Ambulatory Visit (HOSPITAL_COMMUNITY): Payer: Self-pay

## 2023-12-27 ENCOUNTER — Other Ambulatory Visit: Payer: Self-pay | Admitting: Hematology and Oncology

## 2023-12-27 MED ORDER — IBANDRONATE SODIUM 150 MG PO TABS
150.0000 mg | ORAL_TABLET | ORAL | 4 refills | Status: AC
  Filled 2023-12-27: qty 3, 84d supply, fill #0
  Filled 2024-03-21: qty 3, 84d supply, fill #1
  Filled 2024-04-23 – 2024-06-22 (×2): qty 3, 84d supply, fill #2
  Filled 2024-08-30: qty 3, 84d supply, fill #3

## 2023-12-28 ENCOUNTER — Other Ambulatory Visit (HOSPITAL_COMMUNITY): Payer: Self-pay

## 2023-12-28 ENCOUNTER — Encounter: Payer: Self-pay | Admitting: *Deleted

## 2023-12-28 NOTE — Progress Notes (Signed)
 Received mychart message from pt requesting visit with MD to discuss low Alkaline phospate levels.  Pt believes low levels can equate to breast cancer recurrence.  Pt also wanting to discuss with MD increased anxiety while on Wellbutrin.  F/u visit scheduled, pt educated and verbalized understanding.

## 2023-12-29 ENCOUNTER — Other Ambulatory Visit (HOSPITAL_COMMUNITY): Payer: Self-pay

## 2023-12-29 NOTE — Telephone Encounter (Signed)
 FYI

## 2024-01-02 ENCOUNTER — Other Ambulatory Visit: Payer: Self-pay

## 2024-01-02 ENCOUNTER — Other Ambulatory Visit (HOSPITAL_COMMUNITY): Payer: Self-pay

## 2024-01-02 ENCOUNTER — Telehealth: Payer: Self-pay | Admitting: Hematology and Oncology

## 2024-01-02 DIAGNOSIS — C50411 Malignant neoplasm of upper-outer quadrant of right female breast: Secondary | ICD-10-CM

## 2024-01-02 DIAGNOSIS — E7849 Other hyperlipidemia: Secondary | ICD-10-CM

## 2024-01-02 NOTE — Telephone Encounter (Signed)
 Canceled and rescheduled appointments per patients request via incoming call. Patient is aware of all changes made to her upcoming appointments.

## 2024-01-04 ENCOUNTER — Ambulatory Visit: Admitting: Hematology and Oncology

## 2024-01-12 ENCOUNTER — Other Ambulatory Visit (HOSPITAL_COMMUNITY): Payer: Self-pay

## 2024-01-12 ENCOUNTER — Other Ambulatory Visit: Payer: Self-pay | Admitting: Family Medicine

## 2024-01-12 MED ORDER — REPATHA SURECLICK 140 MG/ML ~~LOC~~ SOAJ
140.0000 mg | SUBCUTANEOUS | 11 refills | Status: AC
  Filled 2024-01-12: qty 2, 28d supply, fill #0
  Filled 2024-01-16 – 2024-02-03 (×3): qty 2, 28d supply, fill #1
  Filled 2024-02-24 – 2024-02-27 (×2): qty 2, 28d supply, fill #2
  Filled 2024-03-21 – 2024-04-02 (×2): qty 2, 28d supply, fill #3
  Filled 2024-04-23 – 2024-04-30 (×2): qty 2, 28d supply, fill #4
  Filled 2024-05-16 – 2024-05-24 (×3): qty 2, 28d supply, fill #5
  Filled 2024-06-20: qty 2, 28d supply, fill #6
  Filled 2024-07-18: qty 2, 28d supply, fill #7
  Filled 2024-08-09 – 2024-08-14 (×2): qty 2, 28d supply, fill #8
  Filled 2024-09-11: qty 2, 28d supply, fill #9
  Filled 2024-10-08 – 2024-10-18 (×2): qty 2, 28d supply, fill #10
  Filled 2024-11-12: qty 2, 28d supply, fill #0

## 2024-01-12 NOTE — Telephone Encounter (Signed)
 See pharmacy note

## 2024-01-16 ENCOUNTER — Other Ambulatory Visit (HOSPITAL_COMMUNITY): Payer: Self-pay

## 2024-01-16 ENCOUNTER — Ambulatory Visit: Payer: Commercial Managed Care - PPO | Admitting: Hematology and Oncology

## 2024-01-18 ENCOUNTER — Other Ambulatory Visit (HOSPITAL_COMMUNITY): Payer: Self-pay

## 2024-01-20 ENCOUNTER — Other Ambulatory Visit (HOSPITAL_COMMUNITY): Payer: Self-pay

## 2024-01-20 DIAGNOSIS — Z79899 Other long term (current) drug therapy: Secondary | ICD-10-CM | POA: Diagnosis not present

## 2024-01-20 DIAGNOSIS — L700 Acne vulgaris: Secondary | ICD-10-CM | POA: Diagnosis not present

## 2024-01-20 MED ORDER — ISOTRETINOIN 40 MG PO CAPS
40.0000 mg | ORAL_CAPSULE | Freq: Two times a day (BID) | ORAL | 0 refills | Status: DC
  Filled 2024-01-20: qty 60, 30d supply, fill #0

## 2024-01-25 ENCOUNTER — Other Ambulatory Visit: Payer: Self-pay

## 2024-01-26 ENCOUNTER — Other Ambulatory Visit (HOSPITAL_COMMUNITY): Payer: Self-pay

## 2024-01-30 ENCOUNTER — Other Ambulatory Visit (HOSPITAL_COMMUNITY): Payer: Self-pay

## 2024-01-31 ENCOUNTER — Other Ambulatory Visit (HOSPITAL_COMMUNITY): Payer: Self-pay

## 2024-01-31 ENCOUNTER — Other Ambulatory Visit: Payer: Self-pay

## 2024-02-01 ENCOUNTER — Other Ambulatory Visit (HOSPITAL_COMMUNITY): Payer: Self-pay

## 2024-02-03 ENCOUNTER — Other Ambulatory Visit (HOSPITAL_COMMUNITY): Payer: Self-pay

## 2024-02-17 ENCOUNTER — Other Ambulatory Visit (HOSPITAL_COMMUNITY): Payer: Self-pay

## 2024-02-23 ENCOUNTER — Other Ambulatory Visit (HOSPITAL_COMMUNITY): Payer: Self-pay

## 2024-02-24 ENCOUNTER — Other Ambulatory Visit (HOSPITAL_COMMUNITY): Payer: Self-pay

## 2024-02-24 ENCOUNTER — Ambulatory Visit: Attending: Nurse Practitioner | Admitting: Nurse Practitioner

## 2024-02-24 ENCOUNTER — Encounter: Payer: Self-pay | Admitting: Nurse Practitioner

## 2024-02-24 VITALS — BP 94/64 | HR 73 | Ht 61.0 in | Wt 115.4 lb

## 2024-02-24 DIAGNOSIS — I739 Peripheral vascular disease, unspecified: Secondary | ICD-10-CM

## 2024-02-24 DIAGNOSIS — Z853 Personal history of malignant neoplasm of breast: Secondary | ICD-10-CM | POA: Diagnosis not present

## 2024-02-24 DIAGNOSIS — Z789 Other specified health status: Secondary | ICD-10-CM

## 2024-02-24 DIAGNOSIS — E785 Hyperlipidemia, unspecified: Secondary | ICD-10-CM | POA: Diagnosis not present

## 2024-02-24 NOTE — Patient Instructions (Addendum)
 Medication Instructions:  Your physician recommends that you continue on your current medications as directed. Please refer to the Current Medication list given to you today.  *If you need a refill on your cardiac medications before your next appointment, please call your pharmacy*  Lab Work: Lpa, APOE fasting lab work at [facility] [employer].    Testing/Procedures: NONE ordered at this time of appointment    Follow-Up: At [facility], you and your health needs are our priority.  As part of our continuing mission to provide you with exceptional heart care, our providers are all part of one team.  This team includes your primary Cardiologist (physician) and Advanced Practice Providers or APPs (Physician Assistants and Nurse Practitioners) who all work together to provide you with the care you need, when you need it.  Your next appointment:   1 year(s)  Provider:   Dr. Chancy Comber  We recommend signing up for the patient portal called "MyChart".  Sign up information is provided on this After Visit Summary.  MyChart is used to connect with patients for Virtual Visits (Telemedicine).  Patients are able to view lab/test results, encounter notes, upcoming appointments, etc.  Non-urgent messages can be sent to your provider as well.   To learn more about what you can do with MyChart, go to [email or web address].   Other Instructions

## 2024-02-24 NOTE — Progress Notes (Unsigned)
 Office Visit    Patient Name: Katelyn Shelton Date of Encounter: 02/24/2024  Primary Care Provider:  Almira Jaeger, MD Primary Cardiologist:  Euell Herrlich, MD  Chief Complaint    48 year old female with a history of familial hyperlipidemia, calcification of the iliac artery, breast cancer and anxiety who presents for follow-up related to hyperlipidemia.  Past Medical History    Past Medical History:  Diagnosis Date   Allergy    Anxiety    venflafaxine XR 75 mg   Breast cancer (HCC) 01/2017   right; genetic testing negative in 2018 with [employer] panel   Heart murmur    as child- went away   History of MRSA infection    elbow   Hyperlipidemia    Infection of breast implant (HCC) 01/19/2021   Joint pain    due to medicine induced joint pain   Migraine    otc med prn   Miscarriage    x1   MRSA infection 01/28/2021   Nausea and vomiting 01/19/2021   Neuromuscular disorder (HCC)    neuropathy bilater feet - ? r/t chemo   Osteopenia    UTI (urinary tract infection)    Past Surgical History:  Procedure Laterality Date   AREOLA/NIPPLE RECONSTRUCTION WITH GRAFT Right 12/01/2018   Procedure: RIGHT NIPPLE AREOLA COMPLEX RECONSTRUCTION WITH FULL THICKNESS SKIN GRAFT FROM RIGHT GROIN;  Surgeon: Alger Infield, MD;  Location: [facility];  Service: Plastics;  Laterality: Right;   BREAST IMPLANT REMOVAL Right 01/20/2021   Procedure: REMOVAL RIGHT BREAST IMPLANT;  Surgeon: Alger Infield, MD;  Location: [facility];  Service: Plastics;  Laterality: Right;   BREAST IMPLANT REMOVAL Left 03/02/2021   Procedure: REMOVAL LEFT CHEST IMPLANT AND CAPSULECTOMY;  Surgeon: Alger Infield, MD;  Location: [facility];  Service: Plastics;  Laterality: Left;   BREAST RECONSTRUCTION Left 12/01/2018   Procedure: LEFT BREAST REVISION RECONSTRUCTION WITH SKIN EXCISION;  Surgeon: Alger Infield, MD;  Location: [facility];  Service: Plastics;   Laterality: Left;   BREAST RECONSTRUCTION Left 12/01/2018   Procedure: COMPOSITE GRAFT FROM LEFT NIPPLE (NIPPLE SHARING);  Surgeon: Alger Infield, MD;  Location: [facility];  Service: Plastics;  Laterality: Left;   BREAST RECONSTRUCTION WITH PLACEMENT OF TISSUE EXPANDER AND FLEX HD (ACELLULAR HYDRATED DERMIS) Bilateral 02/28/2017   Procedure: BILATERAL BREAST RECONSTRUCTION WITH PLACEMENT OF TISSUE EXPANDER AND ALLODERM;  Surgeon: Alger Infield, MD;  Location: [facility];  Service: Plastics;  Laterality: Bilateral;   CESAREAN SECTION  2008,2010,2013   x 3   CESAREAN SECTION  02/04/2012   Procedure: CESAREAN SECTION;  Surgeon: Martine Sleek, MD;  Location: [facility];  Service: Gynecology;  Laterality: N/A;  Repeat Cesarean Section Delivery  Boy  @  [phone], Apgars 9/10   COLONOSCOPY  2019   KN-MAC-suprep(exc)-hems/inflammatory polyp   DILATION AND CURETTAGE OF UTERUS     EXCISION OF BREAST LESION Right 03/15/2017   Procedure: RIGHT NIPPLE AND AREOLA EXCISION;  Surgeon: Enid Harry, MD;  Location:  SURGERY CENTER;  Service: General;  Laterality: Right;   LAPAROSCOPIC VAGINAL HYSTERECTOMY WITH SALPINGO OOPHORECTOMY Bilateral 06/29/2018   Procedure: LAPAROSCOPIC ASSISTED VAGINAL HYSTERECTOMY WITH SALPINGO OOPHORECTOMY;  Surgeon: Belle Box, MD;  Location: [facility];  Service: Gynecology;  Laterality: Bilateral;  Request case for [facility] CRNA   LIPOSUCTION WITH LIPOFILLING N/A 07/25/2018   Procedure: LIPOSUCTION WITH LIPOFILLING FROM ABDOMEN TO CHEST;  Surgeon: Alger Infield, MD;  Location:  SURGERY CENTER;  Service: Government social research officer;  Laterality: N/A;   LIPOSUCTION WITH LIPOFILLING Bilateral 12/01/2018   Procedure: LIPOFILLING TO BILATERAL CHEST REVISION;  Surgeon: Alger Infield, MD;  Location: [facility];  Service: Plastics;  Laterality: Bilateral;   NIPPLE SPARING MASTECTOMY Bilateral 02/28/2017   Procedure: RIGHT NIPPLE  SPARING MASTECTOMY; LEFT PROPHYLACTIC NIPPLE SPARING MASTECTOMY;  Surgeon: Enid Harry, MD;  Location: [facility];  Service: General;  Laterality: Bilateral;   PECTUS EXCAVATUM REPAIR  2740   48 years old- very large implant   REMOVAL OF TISSUE EXPANDER AND PLACEMENT OF IMPLANT Bilateral 07/25/2018   Procedure: REMOVAL OF TISSUE EXPANDER AND PLACEMENT OF SILICONE IMPLANT;  Surgeon: Alger Infield, MD;  Location: [facility];  Service: Plastics;  Laterality: Bilateral;   SENTINEL NODE BIOPSY Right 02/28/2017   Procedure: RIGHT AXILLARY SENTINEL LYMPH  NODE BIOPSY;  Surgeon: Enid Harry, MD;  Location:  SURGERY CENTER;  Service: General;  Laterality: Right;   VULVAR LESION REMOVAL     WISDOM TOOTH EXTRACTION      Allergies  Allergies  Allergen Reactions   Fentanyl  Itching    Severe itching - Patient refuses this medication   Peach Flavoring Agent (Non-Screening) Anaphylaxis   Penicillins Hives    AS A CHILD   Sulfa Antibiotics Nausea And Vomiting   Sulfasalazine Nausea And Vomiting and Other (See Comments)   Latex Itching     Labs/Other Studies Reviewed    The following studies were reviewed today:  Cardiac Studies & Procedures   ______________________________________________________________________________________________     ECHOCARDIOGRAM  ECHOCARDIOGRAM COMPLETE 07/27/2022  Narrative ECHOCARDIOGRAM REPORT    Patient Name:   Katelyn Shelton [facility] Date of Exam: 07/27/2022 Medical Rec #:  [id number]     Height:       60.0 in Accession #:    [id number]    Weight:       117.0 lb Date of Birth:  1976/07/27     BSA:          1.486 m Patient Age:    46 years      BP:           128/70 mmHg Patient Gender: F             HR:           72 bpm. Exam Location:  [address]  Procedure: 2D Echo, 3D Echo, Cardiac Doppler, Color Doppler and Strain Analysis  Indications:    Z92.21 Status post administration of cardiotoxic  chemotherapy  History:        Patient has no prior history of Echocardiogram examinations. Risk Factors:Dyslipidemia. H/o breast cancer.  Sonographer:    Verena Glaser BS, RDCS Referring Phys: [phone] [facility]il[facility]   Sonographer Comments: Suboptimal parasternal window. IMPRESSIONS   1. Left ventricular ejection fraction, by estimation, is 55 to 60%. Left ventricular ejection fraction by 3D volume is 59 %. The left ventricle has normal function. The left ventricle has no regional wall motion abnormalities. Left ventricular diastolic parameters were normal. The average left ventricular global longitudinal strain is -23.7 %. 2. Right ventricular systolic function is normal. The right ventricular size is normal. Tricuspid regurgitation signal is inadequate for assessing PA pressure. 3. The mitral valve is normal in structure. No evidence of mitral valve regurgitation. No evidence of mitral stenosis. 4. The aortic valve was not well visualized. Aortic valve regurgitation is not visualized. No aortic stenosis is present. 5. The inferior vena cava is normal in size with <  50% respiratory variability, suggesting right atrial pressure of 8 mmHg.  FINDINGS Left Ventricle: Left ventricular ejection fraction, by estimation, is 55 to 60%. Left ventricular ejection fraction by 3D volume is 59 %. The left ventricle has normal function. The left ventricle has no regional wall motion abnormalities. The average left ventricular global longitudinal strain is -23.7 %. The left ventricular internal cavity size was normal in size. There is no left ventricular hypertrophy. Left ventricular diastolic parameters were normal.  Right Ventricle: The right ventricular size is normal. No increase in right ventricular wall thickness. Right ventricular systolic function is normal. Tricuspid regurgitation signal is inadequate for assessing PA pressure.  Left Atrium: Left atrial size was normal in size.  Right Atrium:  Right atrial size was normal in size.  Pericardium: There is no evidence of pericardial effusion.  Mitral Valve: The mitral valve is normal in structure. No evidence of mitral valve regurgitation. No evidence of mitral valve stenosis.  Tricuspid Valve: The tricuspid valve is normal in structure. Tricuspid valve regurgitation is trivial.  Aortic Valve: The aortic valve was not well visualized. Aortic valve regurgitation is not visualized. No aortic stenosis is present.  Pulmonic Valve: The pulmonic valve was not well visualized. Pulmonic valve regurgitation is not visualized.  Aorta: The aortic root is normal in size and structure.  Venous: The inferior vena cava is normal in size with less than 50% respiratory variability, suggesting right atrial pressure of 8 mmHg.  IAS/Shunts: The interatrial septum was not well visualized.   LEFT VENTRICLE PLAX 2D LVIDd:         4.40 cm         Diastology LVIDs:         3.10 cm         LV e' medial:    10.40 cm/s LV PW:         0.60 cm         LV E/e' medial:  6.7 LV IVS:        0.70 cm         LV e' lateral:   15.30 cm/s LVOT diam:     2.10 cm         LV E/e' lateral: 4.6 LV SV:         63 LV SV Index:   43              2D LVOT Area:     3.46 cm        Longitudinal Strain 2D Strain GLS  -26.3 % (A2C): 2D Strain GLS  -22.3 % (A3C): 2D Strain GLS  -22.5 % (A4C): 2D Strain GLS  -23.7 % Avg:  3D Volume EF LV 3D EF:    Left ventricul ar ejection fraction by 3D volume is 59 %.  3D Volume EF: 3D EF:        59 % LV EDV:       69 ml LV ESV:       28 ml LV SV:        41 ml  RIGHT VENTRICLE             IVC RV Basal diam:  2.60 cm     IVC diam: 1.80 cm RV S prime:     13.30 cm/s TAPSE (M-mode): 2.2 cm  LEFT ATRIUM             Index        RIGHT ATRIUM  Index LA diam:        3.40 cm 2.29 cm/m   RA Pressure: 3.00 mmHg LA Vol (A2C):   21.7 ml 14.60 ml/m  RA Area:     9.73 cm LA Vol (A4C):   19.2 ml 12.92 ml/m  RA  Volume:   19.00 ml  12.78 ml/m LA Biplane Vol: 20.7 ml 13.93 ml/m AORTIC VALVE LVOT Vmax:   93.80 cm/s LVOT Vmean:  64.500 cm/s LVOT VTI:    0.183 m  AORTA Ao Root diam: 2.80 cm  MITRAL VALVE               TRICUSPID VALVE Estimated RAP:  3.00 mmHg MV Decel Time: 229 msec MV E velocity: 69.70 cm/s  SHUNTS MV A velocity: 62.40 cm/s  Systemic VTI:  0.18 m MV E/A ratio:  1.12        Systemic Diam: 2.10 cm  Carson Clara MD Electronically signed by Carson Clara MD Signature Date/Time: 07/27/2022/1:47:56 PM    Final      CT SCANS  CT CARDIAC SCORING (SELF PAY ONLY) 03/30/2018  Addendum 03/30/2018  2:16 PM ADDENDUM REPORT: 03/30/2018 14:13  ADDENDUM: I discussed this case further with Dr. Nicholette Barley. The patient has a history of right breast cancer and radiation. The clustered nodular areas in the inferior right upper lobe and superior right middle lobe are peripheral and anterior, near the right chest wall and most likely reflect postradiation changes or inflammatory. These do not have the typical appearance of metastases. But given the history of breast cancer, I would recommend follow-up chest CT without contrast in 3-6 months to ensure stability or resolution.   Electronically Signed By: Janeece Mechanic M.D. On: 03/30/2018 14:13  Addendum 03/30/2018  1:28 PM ADDENDUM REPORT: 03/30/2018 13:26  EXAM: OVER-READ INTERPRETATION  CT CHEST  The following report is an over-read performed by radiologist Dr. Asenath Blacker [facility] on 03/30/2018. This over-read does not include interpretation of cardiac or coronary anatomy or pathology. The coronary calcium  score interpretation by the cardiologist is attached.  COMPARISON:  None.  FINDINGS: Heart is normal size. Visualized aorta is normal caliber. No adenopathy in the lower mediastinum or hila.  Cluster of nodules in the inferior right upper lobe and superior right middle lobe, likely  inflammatory. No effusions.  Imaging into the upper abdomen shows no acute findings. Bilateral breast implants noted. No acute bony abnormality.  IMPRESSION: Clustered nodules in the inferior right upper lobe and adjacent superior right middle lobe, likely inflammatory.   Electronically Signed By: Janeece Mechanic M.D. On: 03/30/2018 13:26  Narrative CLINICAL DATA:  Risk stratification  EXAM: Coronary Calcium  Score  MEDICATIONS: None.  TECHNIQUE: The patient was scanned on a [employer]. Axial non-contrast 3 mm slices were carried out through the heart. The data set was analyzed on a dedicated work station and scored using the Agatson method.  FINDINGS: Non-cardiac: See separate report from [facility].  Ascending Aorta: Normal size.  No calcifications.  Pericardium: Normal.  Coronary arteries: Normal.  IMPRESSION: Coronary calcium  score of 0. This was 0 percentile for age and sex matched control.  Electronically Signed: By: Christoper Crafts On: 03/30/2018 12:50     ______________________________________________________________________________________________     Recent Labs: 12/22/2023: ALT 18; BUN 16; Creatinine, Ser 0.69; Hemoglobin 14.3; Platelets 253.0; Potassium 4.0; Sodium 140; TSH 0.84  Recent Lipid Panel    Component Value Date/Time   CHOL 109 12/22/2023 1103   CHOL 126 06/07/2022 0722  TRIG 165.0 (H) 12/22/2023 1103   HDL 49.50 12/22/2023 1103   HDL 43 06/07/2022 0722   CHOLHDL 2 12/22/2023 1103   VLDL 33.0 12/22/2023 1103   LDLCALC 27 12/22/2023 1103   LDLCALC 58 06/07/2022 0722    History of Present Illness    48 year old female with the above past medical history including familial hyperlipidemia, calcification of the iliac artery, breast cancer and anxiety.  She was previously followed by Dr. Nicholette Barley and Dr. Ardell Beauvais.  She was diagnosed with metastatic breast cancer in the right breast, estrogen receptor positive in  2018.  She was treated with anastrozole  (Arimidex ), goserelin (Zoladex ), and subsequently denosumab  (Prolia ) for osteoporosis.  Bilateral breast implants were explanted in 02/2021 due to right breast cellulitis, cultures were consistent with MRSA. She has a history of familial hyperlipidemia. Coronary calcium  score in 2019 was 0.  CT of the pelvis in April 2019 showed bilateral common iliac artery atherosclerosis calcification.  She has a history of prior intolerance to Crestor  and was ultimately started on Repatha .   She was last seen in the office on 06/11/2022 and was stable from a cardiac standpoint.  Echocardiogram in 07/2022 showed EF 55 to 60%, normal LV function, no RWMA, normal RV systolic function, no significant valvular abnormalities.  She presents today for follow-up.  Since her last visit she has been stable overall from a cardiac standpoint.  She denies any symptoms concerning for angina, denies claudication.  She does note some mild dizziness with position changes, denies any palpitations, dizziness, presyncope or syncope. BP runs low.  She is active.  She has had a sore pad on her right breast.  MRI in 04/2023 was stable.  She continues to follow with oncology. She reports she has had some anxiety about her health generally though overall, she reports feeling well.    Home Medications    Current Outpatient Medications  Medication Sig Dispense Refill   Calcium  Carbonate (CALCIUM  600 PO) Take 1 capsule by mouth daily.     Evolocumab  (REPATHA  SURECLICK) 140 MG/ML SOAJ Inject 140 mg (1 pen) into the skin every 14 (fourteen) days. 2 mL 11   exemestane  (AROMASIN ) 25 MG tablet Take 1 tablet (25 mg total) by mouth daily after breakfast. 90 tablet 3   gabapentin  (NEURONTIN ) 250 MG/5ML solution Take 0.5-1 mLs (25-50 mg total) by mouth at bedtime. 30 mL 1   ibandronate  (BONIVA ) 150 MG tablet Take in the morning every 28 days; take with a full glass of water, on an empty stomach, and do not take  anything else by mouth or lie down for the next 60 min. 4 tablet 4   ISOtretinoin  (ACCUTANE ) 40 MG capsule Take 1 capsule (40 mg total) by mouth 2 (two) times daily with a fatty meal. 60 capsule 0   lidocaine  (LIDODERM ) 5 % Place 1 patch onto the skin daily. (MAY WEAR UP TO 12HOURS) 30 patch 0   methocarbamol  (ROBAXIN ) 500 MG tablet Take 1 tablet (500 mg total) by mouth at bedtime as needed. 30 tablet 1   Multiple Vitamins-Minerals (MULTIVITAMIN WOMEN 50+) TABS Take 1 tablet by mouth daily.     Ospemifene  (OSPHENA ) 60 MG TABS Take 1 tablet (60 mg total) by mouth daily. 90 tablet 3   tazarotene  (AVAGE ) 0.1 % cream Apply a pea size amount to face topically at night as tolerated 30 g 11   tirzepatide  (ZEPBOUND ) 15 MG/0.5ML Pen Inject 15 mg into the skin once a week. 2 mL 11  traZODone  (DESYREL ) 50 MG tablet Take 1/2-1 tablet (25-50 mg total) by mouth at bedtime as needed for insomnia 90 tablet 1   No current facility-administered medications for this visit.     Review of Systems   She denies chest pain, palpitations, dyspnea, pnd, orthopnea, n, v, dizziness, syncope, edema, weight gain, or early satiety. All other systems reviewed and are otherwise negative except as noted above.   Physical Exam    VS:  BP 94/64   Pulse 73   Ht 5\' 1"  (1.549 m)   Wt 115 lb 6.4 oz (52.3 kg)   LMP 09/01/2017   SpO2 95%   BMI 21.80 kg/m  GEN: Well nourished, well developed, in no acute distress. HEENT: normal. Neck: Supple, no JVD, carotid bruits, or masses. Cardiac: RRR, no murmurs, rubs, or gallops. No clubbing, cyanosis, edema.  Radials/DP/PT 2+ and equal bilaterally.  Respiratory:  Respirations regular and unlabored, clear to auscultation bilaterally. GI: Soft, nontender, nondistended, BS + x 4. MS: no deformity or atrophy. Skin: warm and dry, no rash. Neuro:  Strength and sensation are intact. Psych: Normal affect.  Accessory Clinical Findings    ECG personally reviewed by me today - EKG  Interpretation Date/Time:  Friday Feb 24 2024 11:30:30 EDT Ventricular Rate:  73 PR Interval:  136 QRS Duration:  78 QT Interval:  392 QTC Calculation: 431 R Axis:   63  Text Interpretation: Normal sinus rhythm Normal ECG No previous ECGs available Confirmed by Marlana Silvan ([id number]) on 02/24/2024 11:40:54 AM  - no acute changes.   Lab Results  Component Value Date   WBC 4.9 12/22/2023   HGB 14.3 12/22/2023   HCT 42.9 12/22/2023   MCV 96.3 12/22/2023   PLT 253.0 12/22/2023   Lab Results  Component Value Date   CREATININE 0.69 12/22/2023   BUN 16 12/22/2023   NA 140 12/22/2023   K 4.0 12/22/2023   CL 104 12/22/2023   CO2 28 12/22/2023   Lab Results  Component Value Date   ALT 18 12/22/2023   AST 23 12/22/2023   ALKPHOS 34 (L) 12/22/2023   BILITOT 0.5 12/22/2023   Lab Results  Component Value Date   CHOL 109 12/22/2023   HDL 49.50 12/22/2023   LDLCALC 27 12/22/2023   TRIG 165.0 (H) 12/22/2023   CHOLHDL 2 12/22/2023    No results found for: "HGBA1C"  Assessment & Plan    1. Hyperlipidemia: LDL was 27 in 11/2023, well controlled.  History of familial hyperlipidemia. Coronary calcium  score in 2019 was 0.  CT of the pelvis in April 2019 showed bilateral common iliac artery atherosclerosis calcification. She is requesting an LP(a) and APOE for further risk stratification, will order (she will return for fasting labs).  We also discussed possible repeat coronary calcium  scoring, will defer for now given no concerning symptoms, prior reassuring calcium  score.  If she wishes to discuss alternative lipid-lowering therapy would recommend follow-up with either Dr. Maximo Spar or lipid clinic Pharm.D.  For now, continue Repatha .  2. PAD: CT of the pelvis in April 2019 showed bilateral common iliac artery atherosclerosis calcification.  Denies claudication. Continue to monitor for symptoms, no indication for repeat testing at this time.  Continue Repatha .  3. History of breast cancer:  Following with oncology.   4. Disposition: She wishes to establish with Dr. Chancy Comber. Follow-up in 1 year, sooner if needed.      Jude Norton, NP 02/24/2024, 12:34 PM

## 2024-02-26 ENCOUNTER — Encounter: Payer: Self-pay | Admitting: Nurse Practitioner

## 2024-03-02 ENCOUNTER — Other Ambulatory Visit (HOSPITAL_COMMUNITY): Payer: Self-pay

## 2024-03-05 ENCOUNTER — Other Ambulatory Visit (HOSPITAL_COMMUNITY): Payer: Self-pay

## 2024-03-16 LAB — APOE ALZHEIMER'S RISK

## 2024-03-16 LAB — LIPOPROTEIN A (LPA): Lipoprotein (a): 10.9 nmol/L (ref <75.0)

## 2024-03-20 ENCOUNTER — Other Ambulatory Visit (HOSPITAL_COMMUNITY): Payer: Self-pay

## 2024-03-20 DIAGNOSIS — L2989 Other pruritus: Secondary | ICD-10-CM | POA: Diagnosis not present

## 2024-03-20 DIAGNOSIS — Z79899 Other long term (current) drug therapy: Secondary | ICD-10-CM | POA: Diagnosis not present

## 2024-03-20 DIAGNOSIS — D692 Other nonthrombocytopenic purpura: Secondary | ICD-10-CM | POA: Diagnosis not present

## 2024-03-20 DIAGNOSIS — L700 Acne vulgaris: Secondary | ICD-10-CM | POA: Diagnosis not present

## 2024-03-20 MED ORDER — ISOTRETINOIN 40 MG PO CAPS
40.0000 mg | ORAL_CAPSULE | Freq: Two times a day (BID) | ORAL | 0 refills | Status: DC
  Filled 2024-03-20: qty 60, 30d supply, fill #0

## 2024-03-21 ENCOUNTER — Ambulatory Visit: Payer: Self-pay | Admitting: Nurse Practitioner

## 2024-03-21 ENCOUNTER — Other Ambulatory Visit: Payer: Self-pay

## 2024-03-21 ENCOUNTER — Other Ambulatory Visit (HOSPITAL_COMMUNITY): Payer: Self-pay

## 2024-03-22 ENCOUNTER — Other Ambulatory Visit (HOSPITAL_COMMUNITY): Payer: Self-pay

## 2024-03-26 NOTE — Telephone Encounter (Signed)
 Lmom to discuss lab results. Lab results also sent via mychart to pt.

## 2024-04-02 ENCOUNTER — Other Ambulatory Visit (HOSPITAL_COMMUNITY): Payer: Self-pay

## 2024-04-02 ENCOUNTER — Other Ambulatory Visit: Payer: Self-pay | Admitting: Family Medicine

## 2024-04-02 MED ORDER — ZEPBOUND 15 MG/0.5ML ~~LOC~~ SOAJ
15.0000 mg | SUBCUTANEOUS | 11 refills | Status: AC
  Filled 2024-04-02: qty 2, 28d supply, fill #0
  Filled 2024-04-23: qty 2, 28d supply, fill #1
  Filled 2024-05-16: qty 2, 28d supply, fill #2
  Filled 2024-06-18: qty 2, 28d supply, fill #3
  Filled 2024-07-11: qty 2, 28d supply, fill #4
  Filled 2024-08-07: qty 2, 28d supply, fill #5
  Filled 2024-09-04: qty 2, 28d supply, fill #6
  Filled 2024-10-01: qty 2, 28d supply, fill #7
  Filled 2024-10-25: qty 2, 28d supply, fill #8
  Filled 2024-11-22: qty 2, 28d supply, fill #0

## 2024-04-23 ENCOUNTER — Other Ambulatory Visit: Payer: Self-pay

## 2024-04-23 ENCOUNTER — Other Ambulatory Visit (HOSPITAL_COMMUNITY): Payer: Self-pay

## 2024-04-24 ENCOUNTER — Ambulatory Visit: Payer: Self-pay | Admitting: Family Medicine

## 2024-04-24 NOTE — Telephone Encounter (Signed)
 FYI Only or Action Required?: FYI only for provider.  Patient was last seen in primary care on 12/22/2023 by Katelyn Garnette KIDD, MD. Called Nurse Triage reporting No chief complaint on file.. Symptoms began today. Interventions attempted: Nothing. Symptoms are: stable.  Triage Disposition: See HCP Within 4 Hours (Or PCP Triage)  Patient/caregiver understands and will follow disposition?: No, refuses disposition                           Copied from CRM [id number]. Topic: Clinical - Red Word Triage >> Apr 24, 2024 12:21 PM [place] A wrote: [facility] that prompted transfer to Nurse Triage: Patient stepped on nail Reason for Disposition  [1] Puncture wound of foot AND [2] puncture went through shoe (e.g., tennis shoe)  Answer Assessment - Initial Assessment Questions Pt refused scheduling an appointment at this time and only wants to know when she had her last tetanus shot. This RN educated pt on new-worsening symptoms and when to call back/seek emergent care. Pt verbalized understanding and agrees to plan.    LOCATION: Where is the puncture located?      Right heel OBJECT: What was the object that punctured the skin?      Nail DEPTH: How deep do you think the puncture goes?      Still has shoe on and pt not sure but does not think she needs to be seen; pt just wants to know when she had her last tetanus shot ONSET: When did the injury occur? (Minutes or hours)     Five minutes ago TETANUS: When was the last tetanus booster?     Feb 2024  Protocols used: Puncture Wound-A-AH

## 2024-04-24 NOTE — Telephone Encounter (Signed)
 Thankfully she's up to date on tetanus but since through shoe there are other risks and I agree with need for visit to evaluate

## 2024-04-27 ENCOUNTER — Other Ambulatory Visit: Payer: Self-pay

## 2024-04-30 ENCOUNTER — Other Ambulatory Visit (HOSPITAL_COMMUNITY): Payer: Self-pay

## 2024-04-30 ENCOUNTER — Other Ambulatory Visit: Payer: Self-pay

## 2024-04-30 NOTE — Assessment & Plan Note (Signed)
 02/08/17: T2 N0, stage IB IDC, grade 2, ER/PR positive, HER-2 nonamplified, with a Ki67-1 of 5% Oncotype 11 (9% ROR) 02/28/2017: Bilateral mastectomies: Left: Intraductal papilloma, right: T2N1 stage IIa grade 1 IDC (implants got infected and removed), MammaPrint: Low risk 02/11/2017: Tamoxifen  started neoadjuvantly and held during chemo 02/19/2017: Genetics: Negative 04/15/2017-08/03/2017: Adjuvant chemo with Taxotere  and Cytoxan  every 3 weeks x4 at [facility] 09/30/2017: Completed radiation 10/25/2017: Anastrozole  with goserelin until August 2019, underwent hysterectomy with BSO 06/29/2018 (plan is for 7 years)   Exemestane  toxicities: Severe vaginal dryness: She had done [facility] touch and tells me it has not helped her.   Osphena  is helping her significantly Weight gain secondary to antiestrogen therapy: She is on Ozempic  and has lost all the weight. Decreased sexual desire: Much improved with Osphena  Musculoskeletal aches and pains: Scans are negative including the breast MRI   Breast Cancer Surveillance: Breast Exam: 05/01/24: Benign Mammogram:    She wishes to stay on exemestane  indefinitely.

## 2024-05-01 ENCOUNTER — Ambulatory Visit: Payer: Self-pay | Admitting: Family Medicine

## 2024-05-01 ENCOUNTER — Inpatient Hospital Stay: Attending: Hematology and Oncology | Admitting: Hematology and Oncology

## 2024-05-01 ENCOUNTER — Inpatient Hospital Stay

## 2024-05-01 ENCOUNTER — Other Ambulatory Visit (HOSPITAL_COMMUNITY): Payer: Self-pay

## 2024-05-01 VITALS — BP 108/56 | HR 87 | Temp 98.7°F | Resp 18 | Ht 61.0 in | Wt 115.6 lb

## 2024-05-01 DIAGNOSIS — E785 Hyperlipidemia, unspecified: Secondary | ICD-10-CM | POA: Insufficient documentation

## 2024-05-01 DIAGNOSIS — Z1721 Progesterone receptor positive status: Secondary | ICD-10-CM | POA: Insufficient documentation

## 2024-05-01 DIAGNOSIS — E7849 Other hyperlipidemia: Secondary | ICD-10-CM

## 2024-05-01 DIAGNOSIS — Z923 Personal history of irradiation: Secondary | ICD-10-CM | POA: Diagnosis not present

## 2024-05-01 DIAGNOSIS — C50411 Malignant neoplasm of upper-outer quadrant of right female breast: Secondary | ICD-10-CM | POA: Diagnosis not present

## 2024-05-01 DIAGNOSIS — M898X9 Other specified disorders of bone, unspecified site: Secondary | ICD-10-CM | POA: Insufficient documentation

## 2024-05-01 DIAGNOSIS — Z170 Estrogen receptor positive status [ER+]: Secondary | ICD-10-CM | POA: Diagnosis not present

## 2024-05-01 DIAGNOSIS — F419 Anxiety disorder, unspecified: Secondary | ICD-10-CM | POA: Diagnosis not present

## 2024-05-01 DIAGNOSIS — Z79811 Long term (current) use of aromatase inhibitors: Secondary | ICD-10-CM | POA: Diagnosis not present

## 2024-05-01 DIAGNOSIS — Z1732 Human epidermal growth factor receptor 2 negative status: Secondary | ICD-10-CM | POA: Diagnosis not present

## 2024-05-01 DIAGNOSIS — G8918 Other acute postprocedural pain: Secondary | ICD-10-CM | POA: Insufficient documentation

## 2024-05-01 LAB — LIPID PANEL
Cholesterol: 108 mg/dL (ref 0–200)
HDL: 48 mg/dL (ref >40)
LDL Cholesterol: 21 mg/dL (ref 0–99)
Total CHOL/HDL Ratio: 2.3 ratio
Triglycerides: 197 mg/dL — ABNORMAL HIGH (ref <150)
VLDL: 39 mg/dL (ref 0–40)

## 2024-05-01 LAB — CMP (CANCER CENTER ONLY)
ALT: 25 U/L (ref 0–44)
AST: 26 U/L (ref 15–41)
Albumin: 4.6 g/dL (ref 3.5–5.0)
Alkaline Phosphatase: 36 U/L — ABNORMAL LOW (ref 38–126)
Anion gap: 6 (ref 5–15)
BUN: 11 mg/dL (ref 6–20)
CO2: 29 mmol/L (ref 22–32)
Calcium: 9.3 mg/dL (ref 8.9–10.3)
Chloride: 107 mmol/L (ref 98–111)
Creatinine: 0.62 mg/dL (ref 0.44–1.00)
GFR, Estimated: >60 mL/min (ref >60)
Glucose, Bld: 87 mg/dL (ref 70–99)
Potassium: 4.6 mmol/L (ref 3.5–5.1)
Sodium: 142 mmol/L (ref 135–145)
Total Bilirubin: 0.5 mg/dL (ref 0.0–1.2)
Total Protein: 7.7 g/dL (ref 6.5–8.1)

## 2024-05-01 LAB — CBC WITH DIFFERENTIAL (CANCER CENTER ONLY)
Abs Immature Granulocytes: 0.01 K/uL (ref 0.00–0.07)
Basophils Absolute: 0.1 K/uL (ref 0.0–0.1)
Basophils Relative: 2 %
Eosinophils Absolute: 0.1 K/uL (ref 0.0–0.5)
Eosinophils Relative: 3 %
HCT: 43.2 % (ref 36.0–46.0)
Hemoglobin: 14.8 g/dL (ref 12.0–15.0)
Immature Granulocytes: 0 %
Lymphocytes Relative: 32 %
Lymphs Abs: 1.4 K/uL (ref 0.7–4.0)
MCH: 31.7 pg (ref 26.0–34.0)
MCHC: 34.3 g/dL (ref 30.0–36.0)
MCV: 92.5 fL (ref 80.0–100.0)
Monocytes Absolute: 0.4 K/uL (ref 0.1–1.0)
Monocytes Relative: 10 %
Neutro Abs: 2.3 K/uL (ref 1.7–7.7)
Neutrophils Relative %: 53 %
Platelet Count: 246 K/uL (ref 150–400)
RBC: 4.67 MIL/uL (ref 3.87–5.11)
RDW: 12.3 % (ref 11.5–15.5)
WBC Count: 4.4 K/uL (ref 4.0–10.5)
nRBC: 0 % (ref 0.0–0.2)

## 2024-05-01 NOTE — Progress Notes (Signed)
 Patient Care Team: Katrinka Garnette KIDD, MD as PCP - General (Family Medicine) Loni Soyla LABOR, MD as PCP - Cardiology (Cardiology) Ebbie Cough, MD as Consulting Physician (General Surgery) Marget Lenis, MD as Consulting Physician (Obstetrics and Gynecology) Irma, Deward Sor, MD as Referring Physician (Hematology and Oncology) Dewey Rush, MD as Consulting Physician (Radiation Oncology) Arelia Filippo, MD as Consulting Physician (Plastic Surgery) Muss, Kasandra NOVAK as Consulting Physician (Internal Medicine) Jenel Carlin POUR, MD (Inactive) as Consulting Physician (Neurology) Odean Potts, MD as Medical Oncologist (Hematology and Oncology)  DIAGNOSIS:  Encounter Diagnosis  Name Primary?   Malignant neoplasm of upper-outer quadrant of right breast in female, estrogen receptor positive (HCC) Yes    SUMMARY OF ONCOLOGIC HISTORY: Oncology History  Malignant neoplasm of upper-outer quadrant of right breast in female, estrogen receptor positive (HCC)  02/11/2017 Initial Diagnosis   Malignant neoplasm of upper-outer quadrant of right breast in female, estrogen receptor positive (HCC)   02/21/2017 Genetic Testing   Negative genetic testing on the STAT panel through [facility].  The STAT gene panel offered by [facility] and includes sequencing and rearrangement analysis for the following 9 genes: ATM, BRCA1, BRCA2, CDH1, CHEK2, PALB2, PTEN, STK11, and TP53.  The report date is February 19, 2017.  We re-requisitioned for a larger Multi gene panel, which was also negative.  The Multi-Gene Panel offered by [facility] includes sequencing and/or deletion duplication testing of the following 80 genes: ALK, APC, ATM, AXIN2,BAP1,  BARD1, BLM, BMPR1A, BRCA1, BRCA2, BRIP1, CASR, CDC73, CDH1, CDK4, CDKN1B, CDKN1C, CDKN2A (p14ARF), CDKN2A (p16INK4a), CEBPA, CHEK2, CTNNA1, DICER1, DIS3L2, EGFR (c.2369C>T, p.Thr790Met variant only), EPCAM (Deletion/duplication testing only), FH, FLCN, GATA2,  GPC3, GREM1 (Promoter region deletion/duplication testing only), HOXB13 (c.251G>A, p.Gly84Glu), HRAS, KIT, MAX, MEN1, MET, MITF (c.952G>A, p.Glu318Lys variant only), MLH1, MSH2, MSH3, MSH6, MUTYH, NBN, NF1, NF2, NTHL1, PALB2, PDGFRA, PHOX2B, PMS2, POLD1, POLE, POT1, PRKAR1A, PTCH1, PTEN, RAD50, RAD51C, RAD51D, RB1, RECQL4, RET, RUNX1, SDHAF2, SDHA (sequence changes only), SDHB, SDHC, SDHD, SMAD4, SMARCA4, SMARCB1, SMARCE1, STK11, SUFU, TERT, TERT, TMEM127, TP53, TSC1, TSC2, VHL, WRN and WT1.  The report date is February 21, 2017.      CHIEF COMPLIANT: 70-month follow-up to discuss symptoms of pain in the right breast  HISTORY OF PRESENT ILLNESS: History of Present Illness Katelyn Shelton is a 48 year old female with breast cancer who presents for follow-up regarding post-surgical pain and anxiety management.  She experiences manageable bone pain and a persistent tender spot post-surgery, possibly due to scar tissue. Previous MRI results were normal, providing reassurance. Anxiety about cancer recurrence has improved with current medication, which also aids her sleep.  Her family history includes breast cancer, with her mother-in-law currently at stage four, contributing to her anxiety. She is on Aromasin  due to previous cancer in the lymph nodes and tumor size, and she wishes to continue this treatment long-term.     ALLERGIES:  is allergic to fentanyl , peach flavoring agent (non-screening), penicillins, sulfa antibiotics, sulfasalazine, and latex.  MEDICATIONS:  Current Outpatient Medications  Medication Sig Dispense Refill   Calcium  Carbonate (CALCIUM  600 PO) Take 1 capsule by mouth daily.     Evolocumab  (REPATHA  SURECLICK) 140 MG/ML SOAJ Inject 140 mg (1 pen) into the skin every 14 (fourteen) days. 2 mL 11   exemestane  (AROMASIN ) 25 MG tablet Take 1 tablet (25 mg total) by mouth daily after breakfast. 90 tablet 3   gabapentin  (NEURONTIN ) 250 MG/5ML solution Take 0.5-1 mLs (25-50 mg  total) by mouth  at bedtime. 30 mL 1   ibandronate  (BONIVA ) 150 MG tablet Take in the morning every 28 days; take with a full glass of water, on an empty stomach, and do not take anything else by mouth or lie down for the next 60 min. 4 tablet 4   ISOtretinoin  (ACCUTANE ) 40 MG capsule Take 1 capsule (40 mg total) by mouth 2 (two) times daily with a fatty meal. 60 capsule 0   lidocaine  (LIDODERM ) 5 % Place 1 patch onto the skin daily. (MAY WEAR UP TO 12HOURS) 30 patch 0   methocarbamol  (ROBAXIN ) 500 MG tablet Take 1 tablet (500 mg total) by mouth at bedtime as needed. 30 tablet 1   Multiple Vitamins-Minerals (MULTIVITAMIN WOMEN 50+) TABS Take 1 tablet by mouth daily.     Ospemifene  (OSPHENA ) 60 MG TABS Take 1 tablet (60 mg total) by mouth daily. 90 tablet 3   tazarotene  (AVAGE ) 0.1 % cream Apply a pea size amount to face topically at night as tolerated 30 g 11   tirzepatide  (ZEPBOUND ) 15 MG/0.5ML Pen Inject 15 mg into the skin once a week. 2 mL 11   traZODone  (DESYREL ) 50 MG tablet Take 1/2-1 tablet (25-50 mg total) by mouth at bedtime as needed for insomnia 90 tablet 1   No current facility-administered medications for this visit.    PHYSICAL EXAMINATION: ECOG PERFORMANCE STATUS: 1 - Symptomatic but completely ambulatory  Vitals:   05/01/24 1124  BP: (!) 108/56  Pulse: 87  Resp: 18  Temp: 98.7 F (37.1 C)  SpO2: 100%   Filed Weights   05/01/24 1124  Weight: 115 lb 9.6 oz (52.4 kg)    LABORATORY DATA:  I have reviewed the data as listed    Latest Ref Rng & Units 12/22/2023   11:03 AM 07/08/2023   10:09 AM 09/10/2022    9:00 AM  CMP  Glucose 70 - 99 mg/dL 81  82  91   BUN 6 - 23 mg/dL 16  12  11    Creatinine 0.40 - 1.20 mg/dL 9.30  9.18  9.34   Sodium 135 - 145 mEq/L 140  142  141   Potassium 3.5 - 5.1 mEq/L 4.0  4.3  4.1   Chloride 96 - 112 mEq/L 104  106  109   CO2 19 - 32 mEq/L 28  28  26    Calcium  8.4 - 10.5 mg/dL 9.1  9.6  8.4   Total Protein 6.0 - 8.3 g/dL 7.8  8.0   7.6   Total Bilirubin 0.2 - 1.2 mg/dL 0.5  0.5  0.5   Alkaline Phos 39 - 117 U/L 34  47  62   AST 0 - 37 U/L 23  24  20    ALT 0 - 35 U/L 18  22  16      Lab Results  Component Value Date   WBC 4.4 05/01/2024   HGB 14.8 05/01/2024   HCT 43.2 05/01/2024   MCV 92.5 05/01/2024   PLT 246 05/01/2024   NEUTROABS 2.3 05/01/2024    ASSESSMENT & PLAN:  Malignant neoplasm of upper-outer quadrant of right breast in female, estrogen receptor positive (HCC) 02/08/17: T2 N0, stage IB IDC, grade 2, ER/PR positive, HER-2 nonamplified, with a Ki67-1 of 5% Oncotype 11 (9% ROR) 02/28/2017: Bilateral mastectomies: Left: Intraductal papilloma, right: T2N1 stage IIa grade 1 IDC (implants got infected and removed), MammaPrint: Low risk 02/11/2017: Tamoxifen  started neoadjuvantly and held during chemo 02/19/2017: Genetics: Negative 04/15/2017-08/03/2017: Adjuvant chemo with Taxotere  and  Cytoxan  every 3 weeks x4 at [facility] 09/30/2017: Completed radiation 10/25/2017: Anastrozole  with goserelin until August 2019, underwent hysterectomy with BSO 06/29/2018 (plan is for 7 years)   Exemestane  toxicities: Severe vaginal dryness: She had done [facility] touch and tells me it has not helped her.   Osphena  is helping her significantly Weight gain secondary to antiestrogen therapy: She is on Ozempic  and has lost all the weight. Decreased sexual desire: Much improved with Osphena  Musculoskeletal aches and pains: Scans are negative including the breast MRI   Breast Cancer Surveillance: We discussed the role of blood based testing with [facility].  Because of severe anxiety she decided not to do the testing. She wishes to stay on exemestane  indefinitely.  Return to clinic in 6 months for follow-up  Assessment & Plan Malignant neoplasm of right breast In remission, no evidence of recurrence. Post-surgical pain likely due to scar tissue and implant issues. Prefers to avoid routine cancer marker tests due to anxiety. -  Continue Exemestane  25 MG oral daily for at least 10 years, reassess after that period. - Consider MRI every 2-3 years to monitor implant and surgical site, unless symptoms warrant earlier imaging. - Discuss recurrence concerns and family history with focus on stress management and mental health support.  Bone pain Intermittent pain likely due to post-surgical changes and scar tissue. Manageable and not worsening. Imaging ruled out cancer recurrence. - Continue current pain management strategies. - Reassess if new symptoms develop or pain becomes unmanageable.  Anxiety Anxiety related to cancer recurrence and family history. Improved sleep and reduced anxiety with current medication. Concerns about side effects of increasing dosage. - Continue current anxiety management regimen, including Buspirone  7.5 MG oral bid. - Maintain current dosage of sleep medication to avoid side effects. - Explore stress management workshops, such as the [employer] program. - Encourage continuation of activities that promote relaxation, such as swimming, painting, and hiking.  Hyperlipidemia Managed with Evolocumab . Concerns about heart health due to family history and personal history of arterial buildup. Blood pressure well-controlled. - Continue Evolocumab  140 MG subcutaneous every 14 days. - Monitor cholesterol levels regularly. - Ensure blood pressure remains well-controlled. - Discuss heart health concerns and importance of maintaining a healthy lifestyle.      No orders of the defined types were placed in this encounter.  The patient has a good understanding of the overall plan. she agrees with it. she will call with any problems that may develop before the next visit here. Total time spent: 30 mins including face to face time and time spent for planning, charting and co-ordination of care   Naomi MARLA Chad, MD 05/01/24

## 2024-05-03 ENCOUNTER — Telehealth: Payer: Self-pay

## 2024-05-03 NOTE — Telephone Encounter (Signed)
 Called and lm for pt tcb, when pt cb please list the questions that she has. Also informed that she can leave her messages via mychart.  Copied from CRM [id number]. Topic: Clinical - Lab/Test Results >> May 02, 2024  1:49 PM Antwanette L wrote: Reason for CRM: Patient has addtl questions about lab results. Please contact the patient at [phone]

## 2024-05-16 ENCOUNTER — Other Ambulatory Visit: Payer: Self-pay | Admitting: Family Medicine

## 2024-05-16 ENCOUNTER — Other Ambulatory Visit (HOSPITAL_COMMUNITY): Payer: Self-pay

## 2024-05-16 MED ORDER — TRAZODONE HCL 50 MG PO TABS
25.0000 mg | ORAL_TABLET | Freq: Every evening | ORAL | 1 refills | Status: AC | PRN
  Filled 2024-05-16: qty 90, 90d supply, fill #0

## 2024-05-17 ENCOUNTER — Other Ambulatory Visit (HOSPITAL_COMMUNITY): Payer: Self-pay

## 2024-05-21 ENCOUNTER — Encounter (HOSPITAL_COMMUNITY): Payer: Self-pay | Admitting: Pharmacist

## 2024-05-21 ENCOUNTER — Other Ambulatory Visit (HOSPITAL_COMMUNITY): Payer: Self-pay

## 2024-05-21 DIAGNOSIS — Z79899 Other long term (current) drug therapy: Secondary | ICD-10-CM | POA: Diagnosis not present

## 2024-05-21 DIAGNOSIS — L814 Other melanin hyperpigmentation: Secondary | ICD-10-CM | POA: Diagnosis not present

## 2024-05-21 DIAGNOSIS — L308 Other specified dermatitis: Secondary | ICD-10-CM | POA: Diagnosis not present

## 2024-05-21 DIAGNOSIS — L700 Acne vulgaris: Secondary | ICD-10-CM | POA: Diagnosis not present

## 2024-05-21 MED ORDER — ISOTRETINOIN 40 MG PO CAPS
40.0000 mg | ORAL_CAPSULE | Freq: Two times a day (BID) | ORAL | 0 refills | Status: DC
  Filled 2024-05-21 – 2024-05-22 (×2): qty 60, 30d supply, fill #0

## 2024-05-22 ENCOUNTER — Other Ambulatory Visit (HOSPITAL_BASED_OUTPATIENT_CLINIC_OR_DEPARTMENT_OTHER): Payer: Self-pay

## 2024-05-22 ENCOUNTER — Other Ambulatory Visit (HOSPITAL_COMMUNITY): Payer: Self-pay

## 2024-05-24 ENCOUNTER — Other Ambulatory Visit (HOSPITAL_COMMUNITY): Payer: Self-pay

## 2024-06-04 ENCOUNTER — Other Ambulatory Visit (HOSPITAL_COMMUNITY): Payer: Self-pay

## 2024-06-04 DIAGNOSIS — F411 Generalized anxiety disorder: Secondary | ICD-10-CM | POA: Diagnosis not present

## 2024-06-04 DIAGNOSIS — F410 Panic disorder [episodic paroxysmal anxiety] without agoraphobia: Secondary | ICD-10-CM | POA: Diagnosis not present

## 2024-06-04 DIAGNOSIS — G4700 Insomnia, unspecified: Secondary | ICD-10-CM | POA: Diagnosis not present

## 2024-06-04 MED ORDER — TRAZODONE HCL 50 MG PO TABS
100.0000 mg | ORAL_TABLET | Freq: Every evening | ORAL | 1 refills | Status: DC | PRN
  Filled 2024-06-04 – 2024-07-31 (×2): qty 180, 90d supply, fill #0

## 2024-06-04 MED ORDER — LORAZEPAM 0.5 MG PO TABS
0.5000 mg | ORAL_TABLET | Freq: Every day | ORAL | 2 refills | Status: DC | PRN
  Filled 2024-06-04: qty 10, 10d supply, fill #0
  Filled 2024-07-16: qty 10, 10d supply, fill #1
  Filled 2024-08-09: qty 10, 10d supply, fill #2

## 2024-06-18 ENCOUNTER — Other Ambulatory Visit (HOSPITAL_COMMUNITY): Payer: Self-pay

## 2024-06-19 ENCOUNTER — Other Ambulatory Visit (HOSPITAL_COMMUNITY): Payer: Self-pay

## 2024-06-20 ENCOUNTER — Other Ambulatory Visit (HOSPITAL_COMMUNITY): Payer: Self-pay

## 2024-06-22 ENCOUNTER — Other Ambulatory Visit: Payer: Self-pay

## 2024-06-22 ENCOUNTER — Other Ambulatory Visit (HOSPITAL_COMMUNITY): Payer: Self-pay

## 2024-06-26 ENCOUNTER — Other Ambulatory Visit (HOSPITAL_COMMUNITY): Payer: Self-pay

## 2024-07-11 ENCOUNTER — Other Ambulatory Visit (HOSPITAL_COMMUNITY): Payer: Self-pay

## 2024-07-16 ENCOUNTER — Other Ambulatory Visit (HOSPITAL_COMMUNITY): Payer: Self-pay

## 2024-07-18 ENCOUNTER — Other Ambulatory Visit (HOSPITAL_COMMUNITY): Payer: Self-pay

## 2024-07-20 ENCOUNTER — Other Ambulatory Visit (HOSPITAL_COMMUNITY): Payer: Self-pay

## 2024-07-20 DIAGNOSIS — L700 Acne vulgaris: Secondary | ICD-10-CM | POA: Diagnosis not present

## 2024-07-20 DIAGNOSIS — L0889 Other specified local infections of the skin and subcutaneous tissue: Secondary | ICD-10-CM | POA: Diagnosis not present

## 2024-07-20 DIAGNOSIS — L0109 Other impetigo: Secondary | ICD-10-CM | POA: Diagnosis not present

## 2024-07-20 DIAGNOSIS — Z79899 Other long term (current) drug therapy: Secondary | ICD-10-CM | POA: Diagnosis not present

## 2024-07-20 MED ORDER — CEPHALEXIN 500 MG PO CAPS
500.0000 mg | ORAL_CAPSULE | Freq: Three times a day (TID) | ORAL | 0 refills | Status: DC
  Filled 2024-07-20: qty 42, 14d supply, fill #0

## 2024-07-20 MED ORDER — ISOTRETINOIN 40 MG PO CAPS
40.0000 mg | ORAL_CAPSULE | Freq: Two times a day (BID) | ORAL | 0 refills | Status: DC
  Filled 2024-07-20: qty 60, 30d supply, fill #0

## 2024-07-20 MED ORDER — MUPIROCIN 2 % EX OINT
1.0000 | TOPICAL_OINTMENT | Freq: Two times a day (BID) | CUTANEOUS | 0 refills | Status: AC
  Filled 2024-07-20: qty 22, 11d supply, fill #0

## 2024-07-23 ENCOUNTER — Other Ambulatory Visit: Payer: Self-pay | Admitting: Hematology and Oncology

## 2024-07-23 ENCOUNTER — Other Ambulatory Visit (HOSPITAL_COMMUNITY): Payer: Self-pay

## 2024-07-24 ENCOUNTER — Other Ambulatory Visit (HOSPITAL_COMMUNITY): Payer: Self-pay

## 2024-07-24 MED ORDER — OSPHENA 60 MG PO TABS
60.0000 mg | ORAL_TABLET | Freq: Every day | ORAL | 3 refills | Status: AC
  Filled 2024-07-24: qty 90, 90d supply, fill #0
  Filled 2024-08-30 – 2024-10-22 (×2): qty 90, 90d supply, fill #1

## 2024-07-25 ENCOUNTER — Other Ambulatory Visit (HOSPITAL_COMMUNITY): Payer: Self-pay

## 2024-07-31 ENCOUNTER — Other Ambulatory Visit (HOSPITAL_COMMUNITY): Payer: Self-pay

## 2024-08-07 ENCOUNTER — Other Ambulatory Visit (HOSPITAL_COMMUNITY): Payer: Self-pay

## 2024-08-09 ENCOUNTER — Other Ambulatory Visit (HOSPITAL_COMMUNITY): Payer: Self-pay

## 2024-08-14 ENCOUNTER — Other Ambulatory Visit (HOSPITAL_COMMUNITY): Payer: Self-pay

## 2024-08-29 ENCOUNTER — Ambulatory Visit: Payer: Self-pay | Admitting: *Deleted

## 2024-08-29 NOTE — Telephone Encounter (Signed)
 Patient scheduled to see Dr. Kennyth tomorrow. They will obtain sample and interpret results then.

## 2024-08-29 NOTE — Telephone Encounter (Signed)
 FYI Only or Action Required?: Action required by provider: Patient would like to come today to leave specimen- does not want to have appointment- although she did agree to schedule. Please call her and let her know if provider would allow  Patient was last seen in primary care on 12/22/2023 by Katrinka Garnette KIDD, MD.  Called Nurse Triage reporting painful urination.  Symptoms began today.  Interventions attempted: Nothing.  Symptoms are: gradually worsening.  Triage Disposition: See HCP Within 4 Hours (Or PCP Triage)  Patient/caregiver understands and will follow disposition?: Yes

## 2024-08-29 NOTE — Telephone Encounter (Signed)
 Appears scheduled for tomorrow- I think that's reasonable if no openings today- I wouldn't be here this afternoon to interpret result anyway

## 2024-08-29 NOTE — Telephone Encounter (Signed)
 Patient does not want to be seen but wants to leave a urine sample. How would you like to proceed. Please see triage notes.

## 2024-08-29 NOTE — Telephone Encounter (Signed)
 Copied from CRM [id number]. Topic: Clinical - Red Word Triage >> Aug 29, 2024  2:25 PM Katelyn Shelton wrote: Red Word that prompted transfer to Nurse Triage:  painful urination Reason for Disposition  [1] SEVERE pain with urination (e.g., excruciating) AND [2] not improved after 2 hours of pain medicine  Answer Assessment - Initial Assessment Questions Patient has reluctantly scheduled appointment per protocol- she really would like to come to office today and leave specimen- she states there is a lot going on and since she does not have fever should be easy to treat. Patient would like call back.   1. SEVERITY: How bad is the pain?  (e.g., Scale 1-10; mild, moderate, or severe)     8-9/10  3. PATTERN: Is pain present every time you urinate or just sometimes?      yes 4. ONSET: When did the painful urination start?      today 5. FEVER: Do you have a fever? If Yes, ask: What is your temperature, how was it measured, and when did it start?     no 6. PAST UTI: Have you had a urine infection before? If Yes, ask: When was the last time? and What happened that time?      Yes- with travel- it has been a while 7. CAUSE: What do you think is causing the painful urination?  (e.g., UTI, scratch, Herpes sore)     UTI 8. OTHER SYMPTOMS: Do you have any other symptoms? (e.g., blood in urine, flank pain, genital sores, urgency, vaginal discharge)     Urgency/pressure  Protocols used: Urination Pain - Female-A-AH

## 2024-08-30 ENCOUNTER — Encounter: Payer: Self-pay | Admitting: Family Medicine

## 2024-08-30 ENCOUNTER — Other Ambulatory Visit: Payer: Self-pay | Admitting: Hematology and Oncology

## 2024-08-30 ENCOUNTER — Ambulatory Visit: Admitting: Family Medicine

## 2024-08-30 ENCOUNTER — Other Ambulatory Visit: Payer: Self-pay

## 2024-08-30 ENCOUNTER — Other Ambulatory Visit (HOSPITAL_COMMUNITY): Payer: Self-pay

## 2024-08-30 ENCOUNTER — Other Ambulatory Visit: Payer: Self-pay | Admitting: *Deleted

## 2024-08-30 VITALS — BP 98/60 | HR 106 | Temp 98.4°F | Ht 61.0 in | Wt 114.8 lb

## 2024-08-30 DIAGNOSIS — Z872 Personal history of diseases of the skin and subcutaneous tissue: Secondary | ICD-10-CM | POA: Diagnosis not present

## 2024-08-30 DIAGNOSIS — Z170 Estrogen receptor positive status [ER+]: Secondary | ICD-10-CM

## 2024-08-30 DIAGNOSIS — J029 Acute pharyngitis, unspecified: Secondary | ICD-10-CM

## 2024-08-30 DIAGNOSIS — R309 Painful micturition, unspecified: Secondary | ICD-10-CM | POA: Diagnosis not present

## 2024-08-30 LAB — POCT URINALYSIS DIPSTICK
Bilirubin, UA: NEGATIVE
Blood, UA: POSITIVE
Glucose, UA: NEGATIVE
Ketones, UA: POSITIVE
Nitrite, UA: NEGATIVE
Protein, UA: POSITIVE — AB
Spec Grav, UA: >=1.03 — AB (ref 1.010–1.025)
Urobilinogen, UA: 0.2 U/dL
pH, UA: 6 (ref 5.0–8.0)

## 2024-08-30 LAB — POC COVID19 BINAXNOW: SARS Coronavirus 2 Ag: NEGATIVE

## 2024-08-30 LAB — POCT RAPID STREP A (OFFICE): Rapid Strep A Screen: POSITIVE — AB

## 2024-08-30 MED ORDER — LEVOFLOXACIN 500 MG PO TABS
500.0000 mg | ORAL_TABLET | Freq: Every day | ORAL | 0 refills | Status: AC
  Filled 2024-08-30: qty 7, 7d supply, fill #0
  Filled 2024-08-30: qty 5, 5d supply, fill #0

## 2024-08-30 MED ORDER — EXEMESTANE 25 MG PO TABS
25.0000 mg | ORAL_TABLET | Freq: Every day | ORAL | 3 refills | Status: AC
  Filled 2024-08-30 – 2024-11-22 (×2): qty 90, 90d supply, fill #0

## 2024-08-30 NOTE — Progress Notes (Signed)
 Katelyn Shelton is a 48 y.o. female who presents today for an office visit.  Assessment/Plan:  Dysuria  History and exam are consistent with UTI.  No signs of systemic illness.  She has history of allergy to penicillins and sulfa.  We will send out for culture but I did discuss with patient to be several days before we get results back on this and will be reasonable for us  to start empiric antibiotics at this point.  Due to her concurrent strep pharyngitis as below we will treat with course of Levaquin for simplicity regimen.  They did discuss sending in 2 separate prescriptions for this however she would prefer to stay on 1 antibiotic.  Will start Levaquin 500 milligrams daily for 5 days.  We encouraged hydration.  Discussed reasons to return to care.  Strep pharyngitis Rapid strep positive.  Has a penicillin allergy.  As above we did discuss treatment options including two-step antibiotics for her UTI and strep pharyngitis however she prefers to stay on 1 antibiotic possible.  Starting Levaquin as above.  Okay for her to use over-the-counter meds as needed.  Recurrent cellulitis Will check nasal swab for staph colonization per patient request.  She has used Bactroban  in the past for decolonization.    Subjective:  HPI:  See assessment / plan for status of chronic conditions.   Discussed the use of AI scribe software for clinical note transcription with the patient, who gave verbal consent to proceed.  History of Present Illness Katelyn Shelton is a 48 year old female with recurrent urinary tract infections who presents with urinary symptoms and diarrhea.  She experiences severe dysuria, rated as an eight or nine out of ten, which began yesterday. This morning, the pain with urination has significantly decreased. She has a history of recurrent urinary tract infections, often triggered by air travel, but typically does not experience them at home. No fever or chills, although she  mentions experiencing temperature fluctuations. There is no associated back pain.  She reports the onset of diarrhea this morning. No respiratory symptoms such as rhinorrhea, cough, or sneezing. She experienced a sore throat yesterday, which has since resolved. A recent strep test was positive.  Her past medical history includes breast cancer, for which she lost both breast implants due to recurrent infections, possibly staph-related. She has experienced significant staph infections in the past, including a recent one at the end of September, treated by a dermatologist. She frequently experiences infections, which she attributes to potential exposure from her husband, who works in a hospital setting.  She has allergies to penicillin and sulfa drugs, which cause a rash and vomiting, respectively. She has tolerated doxycycline  well in the past, although it is not effective for UTIs. She has previously taken Macrobid  and Cipro  without adverse effects. She is currently on Accutane .         Objective:  Physical Exam: BP 98/60   Pulse (!) 106   Temp 98.4 F (36.9 C) (Temporal)   Ht 5' 1 (1.549 m)   Wt 114 lb 12.8 oz (52.1 kg)   LMP 09/01/2017   SpO2 99%   BMI 21.69 kg/m   Gen: No acute distress, resting comfortably HEENT: OP erythematous. CV: Regular rate and rhythm with no murmurs appreciated Pulm: Normal work of breathing, clear to auscultation bilaterally with no crackles, wheezes, or rhonchi Neuro: Grossly normal, moves all extremities Psych: Normal affect and thought content      Katelyn Shelton M. Kennyth, MD  08/30/2024 2:03 PM

## 2024-08-30 NOTE — Patient Instructions (Signed)
 It was very nice to see you today!  VISIT SUMMARY: Today, you were seen for urinary symptoms and diarrhea. You have a history of recurrent urinary tract infections and staph infections. You were diagnosed with a urinary tract infection and acute strep throat.  YOUR PLAN: URINARY TRACT INFECTION: You have a urinary tract infection, which is causing pain during urination. -You are prescribed Levaquin for 5 days to treat the infection and prevent it from spreading to your kidneys. -We will review your urine culture results on Monday to see if any changes to your treatment are needed.  ACUTE STREPTOCOCCAL PHARYNGITIS: You have a confirmed strep throat infection. -Levaquin will also help treat your strep throat infection.  DIARRHEA: Your diarrhea is likely related to the infections you have. -Monitor your symptoms and stay hydrated. If your diarrhea worsens or persists, please contact us .  RECURRENT STAPHYLOCOCCAL SKIN INFECTIONS: You have a history of recurrent staph infections, which may be due to colonization. -A nasal swab for staph culture has been ordered. -If the culture is positive, we may consider a treatment to eliminate the staph bacteria from your body.  Return if symptoms worsen or fail to improve.   Take care, Dr Kennyth  PLEASE NOTE:  If you had any lab tests, please let us  know if you have not heard back within a few days. You may see your results on mychart before we have a chance to review them but we will give you a call once they are reviewed by us .   If we ordered any referrals today, please let us  know if you have not heard from their office within the next week.   If you had any urgent prescriptions sent in today, please check with the pharmacy within an hour of our visit to make sure the prescription was transmitted appropriately.   Please try these tips to maintain a healthy lifestyle:  Eat at least 3 REAL meals and 1-2 snacks per day.  Aim for no more than 5  hours between eating.  If you eat breakfast, please do so within one hour of getting up.   Each meal should contain half fruits/vegetables, one quarter protein, and one quarter carbs (no bigger than a computer mouse)  Cut down on sweet beverages. This includes juice, soda, and sweet tea.   Drink at least 1 glass of water with each meal and aim for at least 8 glasses per day  Exercise at least 150 minutes every week.

## 2024-08-31 ENCOUNTER — Telehealth: Payer: Self-pay | Admitting: Family Medicine

## 2024-08-31 ENCOUNTER — Other Ambulatory Visit: Payer: Self-pay

## 2024-08-31 LAB — UNLABELED: Test Ordered On Req: 395

## 2024-08-31 NOTE — Telephone Encounter (Signed)
 Yes they still need to run the test. Please call the lab

## 2024-08-31 NOTE — Telephone Encounter (Signed)
 Patient was seen yesterday and looks like there has been a lab error. I think test may need to be reordered.   Copied from CRM #[id number]. Topic: Clinical - Medical Advice >> Aug 31, 2024  9:15 AM Katelyn Shelton wrote: Reason for CRM: Urine culture- no name on the tube- wanting to know if they should still run the test- Please contact [facility]- [phone] reference # [id number]

## 2024-09-02 LAB — MRSA CULTURE
MICRO NUMBER:: 17201504
SPECIMEN QUALITY:: ADEQUATE

## 2024-09-03 ENCOUNTER — Other Ambulatory Visit: Payer: Self-pay | Admitting: Family Medicine

## 2024-09-03 ENCOUNTER — Ambulatory Visit: Payer: Self-pay | Admitting: Family Medicine

## 2024-09-03 DIAGNOSIS — R309 Painful micturition, unspecified: Secondary | ICD-10-CM | POA: Diagnosis not present

## 2024-09-03 NOTE — Progress Notes (Signed)
 Her MRSA swab was negative - can we check on the status of her urine culture?

## 2024-09-04 ENCOUNTER — Other Ambulatory Visit (HOSPITAL_COMMUNITY): Payer: Self-pay

## 2024-09-04 ENCOUNTER — Other Ambulatory Visit: Payer: Self-pay

## 2024-09-05 LAB — PAT ID TIQ DOC: Test Affected: 395

## 2024-09-05 LAB — URINE CULTURE
MICRO NUMBER:: 17225129
MICRO NUMBER:: 17218629
Result:: NO GROWTH
SPECIMEN QUALITY:: ADEQUATE
SPECIMEN QUALITY:: ADEQUATE

## 2024-09-06 ENCOUNTER — Other Ambulatory Visit (HOSPITAL_COMMUNITY): Payer: Self-pay

## 2024-09-06 ENCOUNTER — Ambulatory Visit: Payer: Self-pay | Admitting: Family Medicine

## 2024-09-06 NOTE — Progress Notes (Signed)
 Her urine culture was positive for Klebsiella.  The antibiotic we have her on should treat this.  She should complete her course of antibiotics and let us  know if her symptoms are not improving.

## 2024-09-10 DIAGNOSIS — Z170 Estrogen receptor positive status [ER+]: Secondary | ICD-10-CM | POA: Diagnosis not present

## 2024-09-10 DIAGNOSIS — C50411 Malignant neoplasm of upper-outer quadrant of right female breast: Secondary | ICD-10-CM | POA: Diagnosis not present

## 2024-09-11 ENCOUNTER — Other Ambulatory Visit (HOSPITAL_COMMUNITY): Payer: Self-pay

## 2024-09-11 ENCOUNTER — Other Ambulatory Visit: Payer: Self-pay | Admitting: Family Medicine

## 2024-09-11 MED ORDER — LORAZEPAM 0.5 MG PO TABS
0.5000 mg | ORAL_TABLET | Freq: Every day | ORAL | 2 refills | Status: DC | PRN
  Filled 2024-09-11: qty 10, 10d supply, fill #0

## 2024-09-17 DIAGNOSIS — Z9013 Acquired absence of bilateral breasts and nipples: Secondary | ICD-10-CM | POA: Diagnosis not present

## 2024-09-19 ENCOUNTER — Other Ambulatory Visit (HOSPITAL_COMMUNITY): Payer: Self-pay

## 2024-09-19 DIAGNOSIS — Z79899 Other long term (current) drug therapy: Secondary | ICD-10-CM | POA: Diagnosis not present

## 2024-09-19 DIAGNOSIS — L700 Acne vulgaris: Secondary | ICD-10-CM | POA: Diagnosis not present

## 2024-09-19 DIAGNOSIS — L308 Other specified dermatitis: Secondary | ICD-10-CM | POA: Diagnosis not present

## 2024-09-19 MED ORDER — ISOTRETINOIN 40 MG PO CAPS
40.0000 mg | ORAL_CAPSULE | Freq: Two times a day (BID) | ORAL | 0 refills | Status: AC
  Filled 2024-09-19: qty 60, 30d supply, fill #0

## 2024-09-24 ENCOUNTER — Other Ambulatory Visit (HOSPITAL_COMMUNITY): Payer: Self-pay

## 2024-10-01 ENCOUNTER — Other Ambulatory Visit (HOSPITAL_COMMUNITY): Payer: Self-pay

## 2024-10-02 ENCOUNTER — Other Ambulatory Visit (HOSPITAL_COMMUNITY): Payer: Self-pay

## 2024-10-02 ENCOUNTER — Telehealth: Payer: Self-pay | Admitting: Family Medicine

## 2024-10-02 MED ORDER — OSELTAMIVIR PHOSPHATE 75 MG PO CAPS
75.0000 mg | ORAL_CAPSULE | Freq: Every day | ORAL | 0 refills | Status: DC
  Filled 2024-10-02: qty 10, 10d supply, fill #0

## 2024-10-02 NOTE — Telephone Encounter (Signed)
 Two family members with influenza-sending in Tamiflu  prophylaxis

## 2024-10-03 ENCOUNTER — Encounter: Payer: Self-pay | Admitting: Hematology and Oncology

## 2024-10-17 ENCOUNTER — Other Ambulatory Visit (HOSPITAL_COMMUNITY): Payer: Self-pay

## 2024-10-22 ENCOUNTER — Other Ambulatory Visit (HOSPITAL_COMMUNITY): Payer: Self-pay

## 2024-10-23 ENCOUNTER — Other Ambulatory Visit (HOSPITAL_COMMUNITY): Payer: Self-pay

## 2024-10-26 ENCOUNTER — Other Ambulatory Visit (HOSPITAL_COMMUNITY): Payer: Self-pay

## 2024-10-27 ENCOUNTER — Other Ambulatory Visit (HOSPITAL_COMMUNITY): Payer: Self-pay

## 2024-10-29 ENCOUNTER — Other Ambulatory Visit (HOSPITAL_COMMUNITY): Payer: Self-pay

## 2024-10-29 MED ORDER — LORAZEPAM 0.5 MG PO TABS
0.5000 mg | ORAL_TABLET | Freq: Every day | ORAL | 2 refills | Status: AC | PRN
  Filled 2024-10-29: qty 10, 10d supply, fill #0

## 2024-10-29 MED ORDER — TRAZODONE HCL 50 MG PO TABS
50.0000 mg | ORAL_TABLET | Freq: Every evening | ORAL | 1 refills | Status: AC | PRN
  Filled 2024-10-29: qty 180, 90d supply, fill #0

## 2024-11-09 ENCOUNTER — Other Ambulatory Visit (HOSPITAL_COMMUNITY): Payer: Self-pay

## 2024-11-12 ENCOUNTER — Encounter: Payer: Self-pay | Admitting: Family Medicine

## 2024-11-12 ENCOUNTER — Ambulatory Visit: Payer: Self-pay

## 2024-11-12 ENCOUNTER — Other Ambulatory Visit (HOSPITAL_COMMUNITY): Payer: Self-pay

## 2024-11-12 ENCOUNTER — Ambulatory Visit: Admitting: Family Medicine

## 2024-11-12 VITALS — BP 98/66 | HR 66 | Temp 98.0°F | Resp 18 | Ht 61.0 in | Wt 115.0 lb

## 2024-11-12 DIAGNOSIS — K580 Irritable bowel syndrome with diarrhea: Secondary | ICD-10-CM

## 2024-11-12 MED ORDER — DICYCLOMINE HCL 10 MG PO CAPS
10.0000 mg | ORAL_CAPSULE | Freq: Three times a day (TID) | ORAL | 0 refills | Status: AC
  Filled 2024-11-12: qty 120, 30d supply, fill #0

## 2024-11-12 NOTE — Telephone Encounter (Signed)
 FYI Only or Action Required?: Action required by provider: request for appointment.  Patient was last seen in primary care on 08/30/2024 by Kennyth Worth HERO, MD.  Called Nurse Triage reporting Diarrhea.  Symptoms began a week ago.  Interventions attempted: OTC medications: Imodium, probiotic.  Symptoms are: unchanged.Diarrhea x 1 week, 4-5 watery stools per day. Has abdominal cramping.No fever.  Triage Disposition: See Physician Within 24 Hours  Patient/caregiver understands and will follow disposition?: Yes       Reason for Triage: Diarrhea still persisting since being sick.    Mobile [phone] (M)    Reason for Disposition  [1] MODERATE diarrhea (e.g., 4-6 times / day more than normal) AND [2] present > 48 hours (2 days)  Answer Assessment - Initial Assessment Questions 1. DIARRHEA SEVERITY: How bad is the diarrhea? How many more stools have you had in the past 24 hours than normal?      4-5 2. ONSET: When did the diarrhea begin?      Last Tuesday  3. STOOL DESCRIPTION:  How loose or watery is the diarrhea? What is the stool color? Is there any blood or mucous in the stool?     watery 4. VOMITING: Are you also vomiting? If Yes, ask: How many times in the past 24 hours?      no 5. ABDOMEN PAIN: Are you having any abdomen pain? If Yes, ask: What does it feel like? (e.g., crampy, dull, intermittent, constant)      cramping 6. ABDOMEN PAIN SEVERITY: If present, ask: How bad is the pain?  (e.g., Scale 1-10; mild, moderate, or severe)     moderate 7. ORAL INTAKE: If vomiting, Have you been able to drink liquids? How much liquids have you had in the past 24 hours?     yes 8. HYDRATION: Any signs of dehydration? (e.g., dry mouth [not just dry lips], too weak to stand, dizziness, new weight loss) When did you last urinate?     no 9. EXPOSURE: Have you traveled to a foreign country recently? Have you been exposed to anyone with diarrhea?  Could you have eaten any food that was spoiled?     no 10. ANTIBIOTIC USE: Are you taking antibiotics now or have you taken antibiotics in the past 2 months?       no 11. OTHER SYMPTOMS: Do you have any other symptoms? (e.g., fever, blood in stool)       no 12. PREGNANCY: Is there any chance you are pregnant? When was your last menstrual period?       no  Protocols used: [facility]

## 2024-11-12 NOTE — Progress Notes (Signed)
 "  Assessment & Plan Irritable bowel syndrome with diarrhea Diarrhea for a week, likely viral. Bacterial infection not excluded without stool test. Dehydration risk noted. Bentyl  prescribed for symptom management. - Ordered stool testing to rule out bacterial infection. - Prescribed Bentyl  four times daily with meals and at bedtime. - Advised to maintain hydration and monitor symptoms. - Instructed to visit emergency room if symptoms worsen or dehydration suspected.  - Education provided on IBS. Orders:   GI Profile, Stool, PCR; Future   dicyclomine  (BENTYL ) 10 MG capsule; Take 1 capsule (10 mg total) by mouth 4 (four) times daily -  before meals and at bedtime.   Follow up plan: Return if symptoms worsen or fail to improve.  Niki Rung, MSN, APRN, FNP-C  Subjective:  HPI: Katelyn Shelton is a 49 y.o. female presenting on 11/12/2024 for Diarrhea (Started last Tuesday, Abd pain no vomiting /Slight low back pain /Trying to drink plenty of fluids, probiotics, bland diet )  Discussed the use of AI scribe software for clinical note transcription with the patient, who gave verbal consent to proceed.  She has been experiencing watery diarrhea four to five times a day for almost a week. She has been taking Imodium every six hours without relief. No blood in stool.  In addition to diarrhea, she has abdominal pain and lower back pain, described as slight and diffuse. She denies vomiting. She feels that she is not thinking clearly, affecting her ability to drive safely.  She has a history of similar gastrointestinal issues, including a severe episode in August and diarrhea during a COVID-19 infection in 2021, which required IV fluids. These episodes typically last around ten days.  She has been drinking plenty of fluids, taking probiotics, and eating a bland diet. She is concerned about her ability to stay hydrated.  She recalls a recent event at her son's school where she handled a lot of  money, speculating that she might have contracted something from it.       ROS: Negative unless specifically indicated above in HPI.   Relevant past medical history reviewed and updated as indicated.   Allergies and medications reviewed and updated.  Current Medications[1]  Allergies[2]  Objective:   BP 98/66   Pulse 66   Temp 98 F (36.7 C)   Resp 18   Ht 5' 1 (1.549 m)   Wt 115 lb (52.2 kg)   LMP 09/01/2017   SpO2 99%   BMI 21.73 kg/m    Physical Exam Vitals reviewed.  Constitutional:      General: She is not in acute distress.    Appearance: Normal appearance. She is not ill-appearing, toxic-appearing or diaphoretic.  HENT:     Head: Normocephalic and atraumatic.  Eyes:     General: No scleral icterus.       Right eye: No discharge.        Left eye: No discharge.     Conjunctiva/sclera: Conjunctivae normal.  Cardiovascular:     Rate and Rhythm: Normal rate.  Pulmonary:     Effort: Pulmonary effort is normal. No respiratory distress.  Abdominal:     General: Bowel sounds are increased. There is no distension or abdominal bruit.     Palpations: There is no shifting dullness, fluid wave, hepatomegaly, splenomegaly, mass or pulsatile mass.     Tenderness: There is generalized abdominal tenderness.  Musculoskeletal:        General: Normal range of motion.     Cervical back: Normal  range of motion.  Skin:    General: Skin is warm and dry.     Capillary Refill: Capillary refill takes less than 2 seconds.  Neurological:     General: No focal deficit present.     Mental Status: She is alert and oriented to person, place, and time. Mental status is at baseline.  Psychiatric:        Mood and Affect: Mood normal.        Behavior: Behavior normal.        Thought Content: Thought content normal.        Judgment: Judgment normal.            [1]  Current Outpatient Medications:    Calcium  Carbonate (CALCIUM  600 PO), Take 1 capsule by mouth daily., Disp: ,  Rfl:    dicyclomine  (BENTYL ) 10 MG capsule, Take 1 capsule (10 mg total) by mouth 4 (four) times daily -  before meals and at bedtime., Disp: 120 capsule, Rfl: 0   Evolocumab  (REPATHA  SURECLICK) 140 MG/ML SOAJ, Inject 140 mg (1 pen) into the skin every 14 (fourteen) days., Disp: 2 mL, Rfl: 11   exemestane  (AROMASIN ) 25 MG tablet, Take 1 tablet (25 mg total) by mouth daily after breakfast., Disp: 90 tablet, Rfl: 3   ibandronate  (BONIVA ) 150 MG tablet, Take in the morning every 28 days; take with a full glass of water, on an empty stomach, and do not take anything else by mouth or lie down for the next 60 min., Disp: 4 tablet, Rfl: 4   ISOtretinoin  (ZENATANE  PO), Take by mouth., Disp: , Rfl:    LORazepam  (ATIVAN ) 0.5 MG tablet, Take 1 tablet (0.5 mg total) by mouth daily as needed for severe anxiety., Disp: 10 tablet, Rfl: 2   Multiple Vitamins-Minerals (MULTIVITAMIN WOMEN 50+) TABS, Take 1 tablet by mouth daily., Disp: , Rfl:    mupirocin  ointment (BACTROBAN ) 2 %, Apply thin layer to affected area twice a day and inside each nostril twice a day x 5 days., Disp: 22 g, Rfl: 0   Ospemifene  (OSPHENA ) 60 MG TABS, Take 1 tablet (60 mg total) by mouth daily., Disp: 90 tablet, Rfl: 3   tirzepatide  (ZEPBOUND ) 15 MG/0.5ML Pen, Inject 15 mg into the skin once a week., Disp: 2 mL, Rfl: 11   traZODone  (DESYREL ) 50 MG tablet, Take 1/2-1 tablet (25-50 mg total) by mouth at bedtime as needed for insomnia, Disp: 90 tablet, Rfl: 1   traZODone  (DESYREL ) 50 MG tablet, Take 1-2 tablets (50-100 mg total) by mouth at bedtime as needed for insomnia., Disp: 180 tablet, Rfl: 1 [2]  Allergies Allergen Reactions   Fentanyl  Itching    Severe itching - Patient refuses this medication   Peach Flavoring Agent (Non-Screening) Anaphylaxis   Penicillins Hives    AS A CHILD   Sulfa Antibiotics Nausea And Vomiting   Sulfasalazine Nausea And Vomiting and Other (See Comments)   Latex Itching   "

## 2024-11-14 ENCOUNTER — Ambulatory Visit: Payer: Self-pay | Admitting: Family Medicine

## 2024-11-14 LAB — GI PROFILE, STOOL, PCR

## 2024-11-22 ENCOUNTER — Other Ambulatory Visit: Payer: Self-pay

## 2024-11-22 ENCOUNTER — Other Ambulatory Visit (HOSPITAL_COMMUNITY): Payer: Self-pay

## 2024-11-22 ENCOUNTER — Telehealth: Payer: Self-pay | Admitting: Hematology and Oncology

## 2024-11-22 NOTE — Telephone Encounter (Signed)
 Left vm for pt about rescheduled appt date and time and reason for update. Encouraged to call back if need to move appt

## 2024-12-17 ENCOUNTER — Ambulatory Visit: Admitting: Hematology and Oncology

## 2024-12-17 ENCOUNTER — Other Ambulatory Visit

## 2024-12-18 ENCOUNTER — Inpatient Hospital Stay: Admitting: Adult Health

## 2024-12-18 ENCOUNTER — Inpatient Hospital Stay

## 2024-12-25 ENCOUNTER — Encounter: Payer: Commercial Managed Care - PPO | Admitting: Family Medicine
